# Patient Record
Sex: Female | Born: 1935 | Race: White | Hispanic: No | Smoking: Never smoker
Health system: Southern US, Community
[De-identification: ages and names within clinical notes are randomized; demographics above are authoritative.]

## PROBLEM LIST (undated history)

## (undated) DIAGNOSIS — R32 Unspecified urinary incontinence: Secondary | ICD-10-CM

## (undated) DIAGNOSIS — I5189 Other ill-defined heart diseases: Secondary | ICD-10-CM

## (undated) DIAGNOSIS — E119 Type 2 diabetes mellitus without complications: Secondary | ICD-10-CM

## (undated) DIAGNOSIS — I639 Cerebral infarction, unspecified: Secondary | ICD-10-CM

## (undated) DIAGNOSIS — N139 Obstructive and reflux uropathy, unspecified: Secondary | ICD-10-CM

## (undated) DIAGNOSIS — I2510 Atherosclerotic heart disease of native coronary artery without angina pectoris: Secondary | ICD-10-CM

## (undated) DIAGNOSIS — M1990 Unspecified osteoarthritis, unspecified site: Secondary | ICD-10-CM

## (undated) DIAGNOSIS — I10 Essential (primary) hypertension: Secondary | ICD-10-CM

## (undated) DIAGNOSIS — F32A Depression, unspecified: Secondary | ICD-10-CM

## (undated) DIAGNOSIS — E785 Hyperlipidemia, unspecified: Secondary | ICD-10-CM

## (undated) DIAGNOSIS — R011 Cardiac murmur, unspecified: Secondary | ICD-10-CM

## (undated) DIAGNOSIS — E1142 Type 2 diabetes mellitus with diabetic polyneuropathy: Secondary | ICD-10-CM

## (undated) DIAGNOSIS — Z7901 Long term (current) use of anticoagulants: Secondary | ICD-10-CM

## (undated) DIAGNOSIS — N189 Chronic kidney disease, unspecified: Secondary | ICD-10-CM

## (undated) DIAGNOSIS — D151 Benign neoplasm of heart: Secondary | ICD-10-CM

## (undated) DIAGNOSIS — F329 Major depressive disorder, single episode, unspecified: Secondary | ICD-10-CM

## (undated) DIAGNOSIS — Z794 Long term (current) use of insulin: Secondary | ICD-10-CM

## (undated) DIAGNOSIS — N390 Urinary tract infection, site not specified: Secondary | ICD-10-CM

## (undated) DIAGNOSIS — N289 Disorder of kidney and ureter, unspecified: Secondary | ICD-10-CM

## (undated) DIAGNOSIS — R262 Difficulty in walking, not elsewhere classified: Secondary | ICD-10-CM

## (undated) HISTORY — DX: Chronic kidney disease, unspecified: N18.9

## (undated) HISTORY — DX: Obstructive and reflux uropathy, unspecified: N13.9

## (undated) HISTORY — DX: Other ill-defined heart diseases: I51.89

## (undated) HISTORY — DX: Cerebral infarction, unspecified: I63.9

## (undated) HISTORY — DX: Major depressive disorder, single episode, unspecified: F32.9

## (undated) HISTORY — DX: Unspecified urinary incontinence: R32

## (undated) HISTORY — DX: Hyperlipidemia, unspecified: E78.5

## (undated) HISTORY — PX: REPLACEMENT TOTAL KNEE: SUR1224

## (undated) HISTORY — DX: Type 2 diabetes mellitus with diabetic polyneuropathy: E11.42

## (undated) HISTORY — DX: Type 2 diabetes mellitus without complications: E11.9

## (undated) HISTORY — DX: Long term (current) use of insulin: Z79.4

## (undated) HISTORY — DX: Essential (primary) hypertension: I10

## (undated) HISTORY — DX: Benign neoplasm of heart: D15.1

## (undated) HISTORY — DX: Cardiac murmur, unspecified: R01.1

## (undated) HISTORY — DX: Depression, unspecified: F32.A

## (undated) HISTORY — DX: Atherosclerotic heart disease of native coronary artery without angina pectoris: I25.10

## (undated) HISTORY — DX: Unspecified osteoarthritis, unspecified site: M19.90

## (undated) HISTORY — DX: Long term (current) use of anticoagulants: Z79.01

---

## 2008-11-23 HISTORY — PX: CORONARY ARTERY BYPASS GRAFT: SHX141

## 2013-11-23 HISTORY — PX: AMPUTATION TOE: SHX6595

## 2015-11-24 DIAGNOSIS — D151 Benign neoplasm of heart: Secondary | ICD-10-CM

## 2015-11-24 HISTORY — PX: OTHER SURGICAL HISTORY: SHX169

## 2015-11-24 HISTORY — DX: Benign neoplasm of heart: D15.1

## 2016-05-13 ENCOUNTER — Other Ambulatory Visit: Payer: Self-pay | Admitting: Urology

## 2016-06-16 ENCOUNTER — Other Ambulatory Visit: Payer: Self-pay | Admitting: Urology

## 2016-07-02 ENCOUNTER — Other Ambulatory Visit: Payer: Self-pay | Admitting: Urology

## 2016-07-24 HISTORY — PX: EXCISION MYXOMA: SHX5060

## 2016-07-25 ENCOUNTER — Ambulatory Visit: Payer: Medicare Other

## 2016-07-25 DIAGNOSIS — Z01818 Encounter for other preprocedural examination: Secondary | ICD-10-CM

## 2016-07-25 NOTE — Pre-Procedure Instructions (Signed)
   Telephone conference with pt/daughter/daughter in law, pt lives in [place] w/daughter   Coming to stay w/daughter in law during surgery and recovery   Pt has apt w/surgeon 9/12 for pre-op   PCP's LOV April 2017 per daughter lab including A1C done/ need to request/ Dr in [place]   No  cardio, no recent ekg/pt had coronary by pass 2012/DM type 2/need ekg, order in epic   Need plan for Lantus/victoza, daughter will call PCP who manages   E mail map to [email or web address]

## 2016-07-28 ENCOUNTER — Other Ambulatory Visit (HOSPITAL_COMMUNITY): Payer: Self-pay

## 2016-07-28 DIAGNOSIS — Z01810 Encounter for preprocedural cardiovascular examination: Secondary | ICD-10-CM

## 2016-07-30 ENCOUNTER — Other Ambulatory Visit: Payer: Self-pay

## 2016-07-30 DIAGNOSIS — Z01818 Encounter for other preprocedural examination: Secondary | ICD-10-CM

## 2016-07-30 NOTE — Pre-Procedure Instructions (Signed)
   Call to PCP. No labs within prior 6 months.     Order for fasting blood sugar & A1C placed in EPIC.    Left message for patient to confirm insulin instructions and plan for EKG at [facility].  Asked patient to call [facility].  Message included that labs would be drawn at time of EKG visit.

## 2016-07-30 NOTE — Pre-Procedure Instructions (Addendum)
Received call from patient's daughter in law:  Pt will have EKG done in [place] where she resides, on Frdi[place] 07/31/16.  Copy will be faxed to [facility] as well as hand carried.  Pt will come to [facility] on 08/03/16 for fbs and HgbA1c.       Addendum:  Spoke with sister-in-law Dorene Grebe (from [place]., [phone])).  Patient had labs at [facility] on 07/30/16 @445pm .  Will request.     Patient is going today at 3pm for EKG.  Will hand carry as well as have copy faxed to [facility].    Natalie to call PCP to obtain insulin instructions.     Patients fasting blood sugar ranges between 120-140

## 2016-07-31 ENCOUNTER — Ambulatory Visit (HOSPITAL_COMMUNITY)
Admission: RE | Admit: 2016-07-31 | Discharge: 2016-07-31 | Disposition: A | Discharge status: Home / Self Care | Payer: Medicare Other | Source: Ambulatory Visit | Attending: Cardiovascular Disease | Admitting: Cardiovascular Disease

## 2016-07-31 DIAGNOSIS — Z01810 Encounter for preprocedural cardiovascular examination: Secondary | ICD-10-CM | POA: Insufficient documentation

## 2016-07-31 NOTE — Pre-Procedure Instructions (Signed)
Orders for A1C and glucose discontinued.  Not indicated for Cysto.
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
## 2016-08-04 NOTE — Pre-Procedure Instructions (Signed)
   Spoke w/ [facility] re: EKG - she requests I refax request to [phone] Attn: Isabelle Course - will fax to [facility]

## 2016-08-05 ENCOUNTER — Ambulatory Visit
Admission: RE | Admit: 2016-08-05 | Discharge: 2016-08-05 | Disposition: A | Discharge status: Home / Self Care | Payer: Medicare Other | Source: Ambulatory Visit | Attending: Urology | Admitting: Urology

## 2016-08-05 ENCOUNTER — Ambulatory Visit: Payer: Medicare Other | Admitting: Anesthesiology

## 2016-08-05 ENCOUNTER — Ambulatory Visit: Payer: Medicare Other

## 2016-08-05 ENCOUNTER — Ambulatory Visit: Payer: Medicare Other | Admitting: Urology

## 2016-08-05 ENCOUNTER — Other Ambulatory Visit: Payer: Self-pay

## 2016-08-05 ENCOUNTER — Encounter
Admission: RE | Disposition: A | Discharge status: Home / Self Care | Payer: Self-pay | Source: Ambulatory Visit | Attending: Urology

## 2016-08-05 DIAGNOSIS — Z7982 Long term (current) use of aspirin: Secondary | ICD-10-CM | POA: Insufficient documentation

## 2016-08-05 DIAGNOSIS — E119 Type 2 diabetes mellitus without complications: Secondary | ICD-10-CM | POA: Insufficient documentation

## 2016-08-05 DIAGNOSIS — N1330 Unspecified hydronephrosis: Secondary | ICD-10-CM | POA: Insufficient documentation

## 2016-08-05 DIAGNOSIS — E785 Hyperlipidemia, unspecified: Secondary | ICD-10-CM | POA: Insufficient documentation

## 2016-08-05 DIAGNOSIS — Z951 Presence of aortocoronary bypass graft: Secondary | ICD-10-CM | POA: Insufficient documentation

## 2016-08-05 DIAGNOSIS — I10 Essential (primary) hypertension: Secondary | ICD-10-CM | POA: Insufficient documentation

## 2016-08-05 DIAGNOSIS — R32 Unspecified urinary incontinence: Secondary | ICD-10-CM | POA: Insufficient documentation

## 2016-08-05 DIAGNOSIS — Z87440 Personal history of urinary (tract) infections: Secondary | ICD-10-CM | POA: Insufficient documentation

## 2016-08-05 DIAGNOSIS — Z794 Long term (current) use of insulin: Secondary | ICD-10-CM | POA: Insufficient documentation

## 2016-08-05 HISTORY — DX: Essential (primary) hypertension: I10

## 2016-08-05 HISTORY — DX: Difficulty in walking, not elsewhere classified: R26.2

## 2016-08-05 HISTORY — DX: Type 2 diabetes mellitus without complications: E11.9

## 2016-08-05 HISTORY — DX: Depression, unspecified: F32.A

## 2016-08-05 HISTORY — DX: Unspecified osteoarthritis, unspecified site: M19.90

## 2016-08-05 HISTORY — DX: Hyperlipidemia, unspecified: E78.5

## 2016-08-05 HISTORY — DX: Urinary tract infection, site not specified: N39.0

## 2016-08-05 LAB — GLUCOSE WHOLE BLOOD - POCT
Whole Blood Glucose POCT: 156 mg/dL — ABNORMAL HIGH (ref 70–100)
Whole Blood Glucose POCT: 126 mg/dL — ABNORMAL HIGH (ref 70–100)

## 2016-08-05 SURGERY — CYSTOSCOPY, INSERTION INDWELLING URETERAL STENT
Anesthesia: Anesthesia General | Site: Pelvis | Laterality: Right | Wound class: Clean Contaminated

## 2016-08-05 MED ORDER — GLYCOPYRROLATE 0.2 MG/ML IJ SOLN
INTRAMUSCULAR | Status: AC
Start: 2016-08-05 — End: ?
  Filled 2016-08-05: qty 1

## 2016-08-05 MED ORDER — ONDANSETRON HCL 4 MG/2ML IJ SOLN
INTRAMUSCULAR | Status: AC
Start: 2016-08-05 — End: ?
  Filled 2016-08-05: qty 2

## 2016-08-05 MED ORDER — SODIUM CHLORIDE 0.9 % IV MBP
1000.0000 mg | INTRAVENOUS | Status: DC
Start: 2016-08-05 — End: 2016-08-05
  Administered 2016-08-05: 1 g via INTRAVENOUS

## 2016-08-05 MED ORDER — PHENYLEPHRINE 100 MCG/ML IN NACL 0.9% IV SOSY
PREFILLED_SYRINGE | INTRAVENOUS | Status: DC | PRN
Start: 2016-08-05 — End: 2016-08-05
  Administered 2016-08-05: 50 ug via INTRAVENOUS
  Administered 2016-08-05 (×2): 100 ug via INTRAVENOUS
  Administered 2016-08-05: 50 ug via INTRAVENOUS
  Administered 2016-08-05: 100 ug via INTRAVENOUS

## 2016-08-05 MED ORDER — PROPOFOL INFUSION 10 MG/ML
INTRAVENOUS | Status: DC | PRN
Start: 2016-08-05 — End: 2016-08-05
  Administered 2016-08-05 (×2): 30 mg via INTRAVENOUS
  Administered 2016-08-05: 100 mg via INTRAVENOUS

## 2016-08-05 MED ORDER — FAMOTIDINE 20 MG/2ML IV SOLN
INTRAVENOUS | Status: AC
Start: 2016-08-05 — End: 2016-08-05
  Administered 2016-08-05: 20 mg via INTRAVENOUS
  Filled 2016-08-05: qty 2

## 2016-08-05 MED ORDER — ONDANSETRON HCL 4 MG/2ML IJ SOLN
4.0000 mg | Freq: Once | INTRAMUSCULAR | Status: DC | PRN
Start: 2016-08-05 — End: 2016-08-05

## 2016-08-05 MED ORDER — ACETAMINOPHEN 500 MG PO TABS
1000.0000 mg | ORAL_TABLET | Freq: Once | ORAL | Status: AC
Start: 2016-08-05 — End: 2016-08-05

## 2016-08-05 MED ORDER — OXYCODONE-ACETAMINOPHEN 5-325 MG PO TABS
1.0000 | ORAL_TABLET | Freq: Once | ORAL | Status: DC | PRN
Start: 2016-08-05 — End: 2016-08-05

## 2016-08-05 MED ORDER — ACETAMINOPHEN 500 MG PO TABS
ORAL_TABLET | ORAL | Status: AC
Start: 2016-08-05 — End: 2016-08-05
  Administered 2016-08-05: 1000 mg via ORAL
  Filled 2016-08-05: qty 2

## 2016-08-05 MED ORDER — DEXAMETHASONE SODIUM PHOSPHATE 20 MG/5ML IJ SOLN
INTRAMUSCULAR | Status: AC
Start: 2016-08-05 — End: ?
  Filled 2016-08-05: qty 5

## 2016-08-05 MED ORDER — GLYCOPYRROLATE 0.2 MG/ML IJ SOLN
INTRAMUSCULAR | Status: DC | PRN
Start: 2016-08-05 — End: 2016-08-05
  Administered 2016-08-05: .05 mg via INTRAVENOUS

## 2016-08-05 MED ORDER — FAMOTIDINE 10 MG/ML IV SOLN (WRAP)
INTRAVENOUS | Status: DC | PRN
Start: 2016-08-05 — End: 2016-08-05
  Administered 2016-08-05: 20 mg via INTRAVENOUS

## 2016-08-05 MED ORDER — SODIUM CHLORIDE 0.9% BAG (IRRIGATION USE)
INTRAVENOUS | Status: DC | PRN
Start: 2016-08-05 — End: 2016-08-05
  Administered 2016-08-05: 3000 mL

## 2016-08-05 MED ORDER — ROCURONIUM BROMIDE 50 MG/5ML IV SOLN
INTRAVENOUS | Status: AC
Start: 2016-08-05 — End: ?
  Filled 2016-08-05: qty 5

## 2016-08-05 MED ORDER — NITROFURANTOIN MACROCRYSTAL 100 MG PO CAPS
100.0000 mg | ORAL_CAPSULE | Freq: Two times a day (BID) | ORAL | 0 refills | Status: DC
Start: 2016-08-05 — End: 2016-08-05

## 2016-08-05 MED ORDER — FENTANYL CITRATE (PF) 50 MCG/ML IJ SOLN (WRAP)
25.0000 ug | INTRAMUSCULAR | Status: DC | PRN
Start: 2016-08-05 — End: 2016-08-05

## 2016-08-05 MED ORDER — METOCLOPRAMIDE HCL 5 MG/ML IJ SOLN
10.0000 mg | Freq: Once | INTRAMUSCULAR | Status: DC | PRN
Start: 2016-08-05 — End: 2016-08-05

## 2016-08-05 MED ORDER — ONDANSETRON HCL 4 MG/2ML IJ SOLN
INTRAMUSCULAR | Status: DC | PRN
Start: 2016-08-05 — End: 2016-08-05
  Administered 2016-08-05: 4 mg via INTRAVENOUS

## 2016-08-05 MED ORDER — FAMOTIDINE 20 MG/2ML IV SOLN
INTRAVENOUS | Status: AC
Start: 2016-08-05 — End: ?
  Filled 2016-08-05: qty 2

## 2016-08-05 MED ORDER — FENTANYL CITRATE (PF) 50 MCG/ML IJ SOLN (WRAP)
INTRAMUSCULAR | Status: DC | PRN
Start: 2016-08-05 — End: 2016-08-05
  Administered 2016-08-05 (×4): 25 ug via INTRAVENOUS

## 2016-08-05 MED ORDER — LACTATED RINGERS IV SOLN
INTRAVENOUS | Status: DC
Start: 2016-08-05 — End: 2016-08-05
  Administered 2016-08-05: 1000 mL via INTRAVENOUS

## 2016-08-05 MED ORDER — LIDOCAINE HCL (PF) 2 % IJ SOLN
INTRAMUSCULAR | Status: AC
Start: 2016-08-05 — End: ?
  Filled 2016-08-05: qty 5

## 2016-08-05 MED ORDER — FENTANYL CITRATE (PF) 50 MCG/ML IJ SOLN (WRAP)
INTRAMUSCULAR | Status: AC
Start: 2016-08-05 — End: ?
  Filled 2016-08-05: qty 2

## 2016-08-05 MED ORDER — NITROFURANTOIN MACROCRYSTAL 50 MG PO CAPS
100.0000 mg | ORAL_CAPSULE | Freq: Two times a day (BID) | ORAL | 0 refills | Status: DC
Start: 2016-08-05 — End: 2016-08-25
  Filled 2016-08-05: qty 60, 15d supply, fill #0

## 2016-08-05 MED ORDER — IOVERSOL 68 % IJ SOLN
INTRAMUSCULAR | Status: DC | PRN
Start: 2016-08-05 — End: 2016-08-05
  Administered 2016-08-05: 50 mL

## 2016-08-05 MED ORDER — LACTATED RINGERS IV SOLN
75.0000 mL/h | INTRAVENOUS | Status: AC
Start: 2016-08-05 — End: 2016-08-05

## 2016-08-05 MED ORDER — FAMOTIDINE 10 MG/ML IV SOLN (WRAP)
20.0000 mg | Freq: Once | INTRAVENOUS | Status: AC
Start: 2016-08-05 — End: 2016-08-05

## 2016-08-05 MED ORDER — LIDOCAINE HCL 2 % IJ SOLN
INTRAMUSCULAR | Status: DC | PRN
Start: 2016-08-05 — End: 2016-08-05
  Administered 2016-08-05: 50 mg via INTRAVENOUS

## 2016-08-05 MED ORDER — PROPOFOL 10 MG/ML IV EMUL (WRAP)
INTRAVENOUS | Status: AC
Start: 2016-08-05 — End: ?
  Filled 2016-08-05: qty 20

## 2016-08-05 MED ORDER — LIDOCAINE(URO-JET) 2% JELLY (WRAP)
CUTANEOUS | Status: DC | PRN
Start: 2016-08-05 — End: 2016-08-05
  Administered 2016-08-05: 10 mL via TOPICAL

## 2016-08-05 MED ORDER — HYDROMORPHONE HCL 1 MG/ML IJ SOLN
0.5000 mg | INTRAMUSCULAR | Status: DC | PRN
Start: 2016-08-05 — End: 2016-08-05

## 2016-08-05 SURGICAL SUPPLY — 12 items
CATHETER URETERAL FLEXIMA OD5 FR L70 CM (Catheter Urine) ×2
CATHETER URETERAL FLEXIMA OD5 FR L70 CM 1 LUMEN INJECTION HUB (Catheter Urine) ×2 IMPLANT
CATHETER URETHOPEN END 5F (Catheter Urine) ×1
GLOVE SRGCL 7 GMMX CHEMOTHERAPY PWDR FREE ANTISLIP SMTH MICRO NPRN GRN (Glove) ×2 IMPLANT
GLOVE SURG DERMAPRENE NL SZ7 (Glove) ×1
GLOVE SURGICAL 7 GAMMEX CHEMOTHERAPY (Glove) ×2
SLEEVE CMPR MED KN LGTH KDL SCD 21- IN (Sleeve) ×2
SLEEVE COMPRESSION MEDIUM KNEE LENGTH KENDALL SEQUENTIAL OD21- IN (Sleeve) ×2 IMPLANT
SLEEVE SEQ COMP KNEE REGULAR (Sleeve) ×1
STENT PERCUFLEX PLUS HYDROPLUS OD6 FR (Stent) ×2 IMPLANT
STENT PERCUFLEX PLUS HYDROPLUS OD6 FR L26 CM PERCUFLEXâ„¢ TAPER TIP (Stent) ×2 IMPLANT
TRAY CYSTOSCOPY PACK (Pack) ×3 IMPLANT

## 2016-08-05 NOTE — Transfer of Care (Signed)
Anesthesia Transfer of Care Note    Patient: Glenda Green    Procedures performed: Procedure(s) with comments:  CYSTOSCOPY,RIGHT/RETROGRADE, RIGHT/ URETEROSCOPY, STENT PLACEMENT. - CYSTOSCOPY,RIGHT/RETROGRADE, RIGHT/ URETEROSCOPY, STENT PLACEMENT.    Anesthesia type: General LMA    Patient location:Phase I PACU    Last vitals:   Vitals:    08/05/16 0936   BP: (!) 177/97   Pulse: 77   Resp: 18   Temp:    SpO2: 100%       Post pain: Patient not complaining of pain, continue current therapy      Mental Status:awake and alert     Respiratory Function: tolerating face mask    Cardiovascular: stable    Nausea/Vomiting: patient not complaining of nausea or vomiting    Hydration Status: adequate    Post assessment: no apparent anesthetic complications and no reportable events    Signed by: Hoover Browns  08/05/16 9:37 AM

## 2016-08-05 NOTE — Discharge Instr - AVS First Page (Addendum)
Cystoscopy Discharge Instructions    GENERAL INFORMATION:   . Do not drive a car or operate machinery for 24 hours.  . Do not consume alcohol, tranquilizers, sleeping medications, or any non-prescribed medications for 24 hours unless approved by your physician.    . Do not make important decisions or sign any important papers in the next 24 hours.   . Please have a responsible person with you tonight.     ACTIVITY:   . Rest for 24 hours.    TREATMENT:  . You may have burning with urination. This is normal. To alleviate discomfort, drink plenty of fluids today and tomorrow.   . If it is difficult to start urinating, sit in a tub of warm water to urinate.   . The doctor has dilated your urethra. You will have a small amount of bleeding. It should stop within 24-28 hours.     MEDICATIONS:  . No aspirin or aspirin-containing products until advised by your physician.  . Use pain medication as directed by your physician.     DIET:   . Begin with clear liquids, progress to normal diet.      NOTIFY PHYSICIAN IF:  . Persistent nausea or vomiting.  . Chills, fever (above 101 degrees F).   . Increased bleeding.  . Unable to urinate in 6-8 hours and abdomen is distended.                                   Ureteral Stent    A ureteral stent is a soft plastic tube with holes in it. It's temporarily inserted into a ureter to help drain urine into the bladder. One end goes in the kidney. The other end goes in the bladder. A coil on each end holds the stent in place. The stent can't be seen from outside the body. It shouldn't interfere with your normal routine. Your stent will be put in by a urologist (doctor trained in treating the urinary tract) or another specialist. The procedure is done in a hospital or surgery center. You'll likely go home the same day.  When Is a Ureteral Stent Used?  A ureteral stent may be used:   To bypass a blockage in a kidney or ureter.   During kidney stone removal.   To let a ureter heal after  surgery.    Before the Procedure  Your doctor will give you instructions toprepare for the procedure. X-rays or other imaging tests of your kidneys and ureters may be done beforehand.  During the Procedure   You receive medication to prevent pain and help you relax or sleep during the procedure. Once this takes effect, the procedure starts.   The doctor inserts a cystoscope(lighted instrument) through the urethra and into the bladder. This shows the opening to the ureter.   A thin wire is carefully threaded through the cystoscope, up the ureter, and into the kidney. The stent is inserted over the wire.   A fluoroscope (special x-ray machine) is used to help position the stent. When the stent is in place, the wire and cystoscope are removed.  While You Have a Stent   Some discomfort is normal. Certain movements may trigger pain or a feelingthat you need to urinate. You may also feel mild soreness or pressure before or during urination. These symptoms will go away a few days after the stent is removed.   Medication to control  pain or bladder spasms or to prevent infection may be prescribed. Take this as directed.   Drink plenty of fluids to help flush out your urinary tract.   Your urine may be slightly pink or red. This is due to bleeding caused by minor irritation from the stent. This may happenon and off while you have the stent.  How Long Will You Need a Stent?  The stent is often taken out after the blockagein the ureter is treated or the ureter has healed. This may take1-2 weeks, or longer. If a stent is needed for a long time, it may need to be changed every few months.  Call Your Doctor If:   Your urine contains blood clots.   You constantly leak urine.   You have a fever over 100.52F, chills, nausea, or vomiting.   Your pain is not relieved with medication.   The end of the stent comes out of the urethra.      [address], [address], [place], [place] [address]. All  rights reserved. This information is not intended as a substitute for professional medical care. Always follow your healthcare professional's instructions.    Otelia Santee, MD 08/05/2016     Anesthesia Discharge Instructions: After Your Surgery  You've just had surgery. During surgery you were given medicine called anesthesia to keep you relaxed and comfortable. After surgery you may have some pain or nausea. This is common. Your doctor or nurse will show you how to take care of yourself when you go home. He or she will also answer your questions. Here are some tips for feeling better and getting well after surgery.        For the first 24 hours after your surgery:   Do not drive or use heavy equipment.    Do not make important decisions or sign legal papers.   Do not drink alcohol.   Have someone stay with you, if needed. He or she can watch for problems and help keep you safe.   Have an adult family member or friend drive you home.    Managing pain  If you have pain after surgery, pain medicine will help you feel better. Take it as ordered by your surgeon, before pain becomes severe. Also, ask your doctor about other ways to control pain. This might be with rest, ice, repositioning, and elevation. Follow any other instructions your surgeon or nurse gives you.    Tips for taking pain medicine:    Stay on schedule with your medication   Most pain relievers taken by mouth need at least 20 to 30 minutes to start to work.   Taking medicine on a schedule can help you remember to take it. Try to time your medicine so that you can take it before starting an activity. This might be before you get dressed, go for a walk, or sit down for dinner.    Common side effects of prescription pain medications   Pain medicines can cause nausea. Eat a little food before taking pain medicine to avoid this.   Constipation is a common side effect of pain medicines. Drinking lots of fluids and eating foods, such as fruits and  vegetables, that are high in fiber can also help.    Call your doctor before taking any medicines such as laxatives or stool softeners to help ease constipation. Also, ask if you should avoid any foods. Remember, do not take laxatives unless your surgeon has prescribed them  Drinking alcohol and taking pain medicines can cause dizziness and slow your breathing. It can even be deadly. Do not drink alcohol while taking pain medicines.   Pain medicines can make you react more slowly to things. Do not drive or run machinery while taking pain medicines.    Important facts about Acetaminophen (Tylenol)   Acetaminophen or Tylenol is a common pain reliever.   Check your prescription pain medication labels to see if it contains acetaminophen.   Your health care provider may tell you to take acetaminophen to help ease your pain. Ask him or her how much you should to take each day.    Some prescription pain medicines have acetaminophen and other ingredients. Using both prescription pain medicines and over the counter (OTC) acetaminophen for pain can cause you to overdose.   Max dose of acetaminophen is 4000mg /24 hours for most people. Overdosing on acetaminophen may lead to liver failure.   Read the labels on your (over the counter) medicines with care. This will help you to clearly know the list of ingredients, how much to take, and any warnings. This will prevent you from taking too much acetaminophen.   If you have questions or do not understand the information, ask your pharmacist or health care provider to explain it to you before you take the OTC medicine.    Managing nausea  Some people have an upset stomach after surgery. This is often because of anesthesia, pain, pain medicines, or the stress of surgery. If you were on a special food plan before surgery, ask your doctor if you should follow it while you get better. The following are tips to help you manage nausea after anesthesia:   Do not push yourself to  eat. Your body will tell you when to eat and how much.   Start off with clear liquids and soup. They are easier to digest.   Next try semi-solid foods such as mashed potatoes, applesauce, and gelatin, as you feel ready.   Slowly move to solid foods. Don't eat fatty, rich, or spicy foods at first.   Do not force yourself to have 3 large meals a day. Instead eat smaller amounts more often.   Take pain medicines with a small amount of food, such as crackers or toast, to avoid nausea.     Call your surgeon if:   You have worsening pain an hour after taking pain medicine. The pain medicine may not be strong enough.   You feel too sleepy, dizzy, or groggy. The pain medicine may be too strong.   You have side effects like persistent nausea, vomiting, or skin changes such as rash, itching, or hives.    You have bleeding through your dressing or your dressing becomes saturated.   You have signs of infection such as redness, fever, or increased/foul smelling drainage.      If you have obstructive sleep apnea   You were given anesthesia medicine during surgery to keep you comfortable and free of pain. After surgery, you may have more apnea spells because of this medicine and other medicines you were given. The spells may last longer than usual.   At home:   Keep using the continuous positive airway pressure (CPAP) device when you sleep. Unless your health care provider tells you not to, use it when you sleep, day or night. CPAP is a common device used to treat obstructive sleep apnea.   Talk with your provider before taking any pain medicine, muscle relaxants, or  sedatives. Your provider will tell you about the possible dangers of taking these medicines.

## 2016-08-05 NOTE — PACU (Signed)
Called anesthesia for post op note. Unable to visit patient prior to discharge. Ok for patient to go home.

## 2016-08-05 NOTE — Anesthesia Postprocedure Evaluation (Signed)
Anesthesia Post Evaluation    Patient: Glenda Green    Procedure(s) with comments:  CYSTOSCOPY,RIGHT/RETROGRADE, RIGHT/ URETEROSCOPY, STENT PLACEMENT. - CYSTOSCOPY,RIGHT/RETROGRADE, RIGHT/ URETEROSCOPY, STENT PLACEMENT.    Anesthesia type: general    Last Vitals:   Vitals:    08/05/16 1200   BP:    Pulse: 76   Resp:    Temp:    SpO2: 100%                         Post Assessment: no apparent anesthetic complications and no reportable events          Anesthesia Qualified Clinical Data Registry    Central Line      CVC insertion : NO                                               Perioperative temperature management      General/neuraxial anesthesia > or = 60 minutes (excluding CABG) : YES              > Use of intraoperative active warming : YES              > Temperature > or = 36 degrees Centigrade (96.8 degrees Farenheit) during time span from 30 minutes before up to 15 minutes after anesthesia end time : YES      Administration of antibiotic prophylaxis      Age > or = 18, with IV access, with surgical procedure for which antibiotic prophylaxis indicated, and not on chronic antibiotics : NO                  Medication Administration      Ordering or administration of drug inconsistent with intended drug, dose, delivery or timing : NO      Dental loss/damage      Dental injury with administration of anesthesia : NO      Difficult intubation due to unrecognized difficult airway        Elective airway procedure including but not limited to: tracheostomy, fiberoptic bronchoscopy, rigid bronchoscopy; jet ventilation; or elective use of a device to facilitate airway management such as a Glidescope : NO                > Unanticipated difficult intubation post pre-evaluation : NO      Aspiration of gastric contents        Aspiration of gastric contents : NO                    Surgical fire        Procedure requiring electrocautery/laser : YES                > Ignition/burning in invasive procedure location : NO       Immediate perioperative cardiac arrest        Cardiac arrest in OR or PACU : NO                    Unplanned hospital admission        Unplanned hospital admission for initially intended outpatient anesthesia service : NO      Unplanned ICU admission        Unplanned ICU admission related to anesthesia occurring within 24 hours of induction or start of MAC :  NO      Surgical case cancellation        Cancellation of procedure after care already started by anesthesia care team : NO      Post-anesthesia transfer of care checklist/protocol to PACU        Transfer from OR to PACU upon case conclusion : YES              > Use of PACU transfer checklist/protocol : YES     (Includes the key elements of: patient identification, responsible practitioner identification (PACU nurse or advanced practitioner), discussion of pertinent history and procedure course, intraoperative anesthetic management, post-procedure plans, acknowledgement/questions)    Post-anesthesia transfer of care checklist/protocol to ICU        Transfer from OR to ICU upon case conclusion : NO                    Post-operative nausea/vomiting risk protocol        Post-operative nausea/vomiting risk protocol : YES  Patient > or = 18 with care initiated by anesthesia team that has a risk factor screen for post-op nausea/vomiting (Includes female, hx PONV, or motion sickness, non-smoker, intended opioid administration for post-op analgesia.)    Anaphylaxis        Anaphylaxis during anesthesia services : NO    (Inclusive of any suspected transfusion reaction in association with blood-bank confirmed blood product incompatibility)            NOTE: Patient discharged after meeting criteria. No CV, RESP, Neuro, Hydration, Temp, PONV or Pain problems related to anesthesia care.   Glenda Green, 08/05/2016 3:26 PM

## 2016-08-05 NOTE — Anesthesia Preprocedure Evaluation (Signed)
Anesthesia Evaluation    AIRWAY    Mallampati: I    TM distance: >3 FB  Neck ROM: full  Mouth Opening:full   CARDIOVASCULAR    cardiovascular exam normal       DENTAL    no notable dental hx     PULMONARY    pulmonary exam normal     OTHER FINDINGS    Glenda Green, Glenda Green ([id number]) - 80 y.o. Female     Anesthesia History       Hypertension   Hyperlipidemia  Type 2 diabetes mellitus, controlled   Arthritis  Depression   Urinary tract infection  Difficulty walking   Surgical History       CORONARY ARTERY BYPASS   FOOT AMPUTATION  JOINT REPLACEMENT   Substance History       Smoking Status: Never Smoker  Smokeless Tobacco Status: Unknown  Alcohol use: No  Drug use: Not Asked  Problem List       Hydronephrosis of right kidney      NOTE:    1. HTN controlled with Toprol    2. DM type 2. Takes Lantus 10 units q night. Only took 5 units last night. Accu check = 150    3. HLD    4. CAD. S/p CABG. Not currently symptomatic    5. Uses a wheelchair due to instability/ loss of balance secondary to tor amputation.           Relevant Problems   No active problems are marked relevant to this note.               Anesthesia Plan    ASA 3     general                     intravenous induction   Detailed anesthesia plan: general LMA        Post op pain management: per surgeon    informed consent obtained    Plan discussed with CRNA.    ECG reviewed  pertinent labs reviewed             Signed by: Francis Gaines Dejanee Thibeaux 08/05/16 9:04 AM

## 2016-08-05 NOTE — H&P (Signed)
[facility]   HISTORY AND PHYSICAL EXAM      Date Time: 08/05/16 8:18 AM  Patient Name: Glenda Green,Glenda Green      History of Presenting Illness:   80 year-old female with h/o recurrent UTI, incontinence and right hydroureteronephrosis/obstruction for cystoscopy, right retrograde, right ureteroscopy with possible dilation and/or biopsy and ureteral stent placement.    Past Medical History:     Past Medical History:   Diagnosis Date   . Arthritis    . Depression    . Difficulty walking     unsteady gait uses walker/cane/wheelchair   . Hyperlipidemia    . Hypertension    . Type 2 diabetes mellitus, controlled    . Urinary tract infection     multiple time on Nitrofurantion maintaining dose       Past Surgical History:     Past Surgical History:   Procedure Laterality Date   . CORONARY ARTERY BYPASS  2010   . FOOT AMPUTATION  2015    x2 three toes   . JOINT REPLACEMENT  2010    left knee       Family History:   History reviewed. No pertinent family history.    Social History:     Social History     Social History   . Marital status: Widowed     Spouse name: N/Green   . Number of children: N/Green   . Years of education: N/Green     Social History Main Topics   . Smoking status: Never Smoker   . Smokeless tobacco: Not on file   . Alcohol use No   . Drug use: Unknown   . Sexual activity: Not on file     Other Topics Concern   . Not on file     Social History Narrative   . No narrative on file       Allergies:     Allergies   Allergen Reactions   . Tape Itching       Medications:     Prior to Admission medications    Medication Sig Start Date End Date Taking? Authorizing Provider   aspirin EC 81 MG EC tablet Take 81 mg by mouth daily.   Yes [provider]   atorvastatin (LIPITOR) 40 MG tablet Take 40 mg by mouth daily.   Yes [provider]   DULoxetine (CYMBALTA) 60 MG capsule Take 60 mg by mouth daily.   Yes [provider]   ferrous sulfate 325 (65 FE) MG tablet Take 325 mg by mouth every morning with  breakfast.   Yes [provider]   Insulin Glargine (LANTUS SC) Inject 10 Unit into the skin nightly.Took 5 units 08/04/16       Yes [provider]   liraglutide (VICTOZA) 18 MG/3ML Solution Pen-injector Inject 1.8 each into the skin daily.       Yes [provider]   metoprolol XL (TOPROL-XL) 25 MG 24 hr tablet Take 25 mg by mouth daily.   Yes [provider]   nitrofurantoin (MACRODANTIN) 100 MG capsule Take 100 mg by mouth daily.   Yes [provider]   Probiotic Product (PROBIOTIC ADVANCED PO) Take by mouth.   Yes [provider]       Physical Exam:   Constitutional: Well-appearing  Chest: Normal chest rise  Cardiovascular: Regular rate  Abdomen: Soft, non-tender  Neuro/Pysch: Alert, oriented and grossly non-focal    Assessment & Plan:   Patient has  been evaluated and is deemed safe to proceed forward with the procedure discussed.   They have been given ample opportunity to ask appropriate questions and those questions have been answered to their satisfaction.   The consent form has been signed, witnessed and placed into the chart.        Signed by: Otelia Santee, MD

## 2016-08-05 NOTE — Brief Op Note (Signed)
BRIEF OP NOTE    Date Time: 08/05/16 9:29 AM    Patient Name:   Glenda Green    Date of Operation:   08/05/2016    Providers Performing:   Surgeon(s):  Darcia Lampi, Hoyle Sauer, MD    Assistant (s): None    Operative Procedure:   Procedure(s):  CYSTOSCOPY,RIGHT/RETROGRADE, RIGHT/ URETEROSCOPY, STENT PLACEMENT.    Preoperative Diagnosis:   Pre-Op Diagnosis Codes:     * Hydronephrosis, unspecified hydronephrosis type [N13.30]    Postoperative Diagnosis:   * No post-op diagnosis entered *    Anesthesia:   * No anesthesia type entered *    Estimated Blood Loss:        Implants:     Implant Name Type Inv. Item Serial No. Manufacturer Lot No. LRB No. Used Action   STENT PERCFLX 30F [id number] - [id number] Stent STENT PERCFLX 30F [id number]   [employer] [id number] Right 1 Implanted       Drains:   Drain #1: None    Specimens:        SPECIMENS (last 24 hours)      Pathology Specimens     Row Name 08/05/16 0900                Specimen Information    Specimen Testing Required None per surgeon           Findings:   Debris in bladder (culture sent); bullous edema of bladder wall likely c/w cystitis; punctate right UO papillary lesion vs. inflammatory changes at left UO; bladder trabeculation with cellule and saccule formation; 1.5-2cm narrowing of right distal ureter and severe hydroureteronephrosis proximally on RTG; debris in right kidney w/o any obvious stones or filling defects in kidney or ureter, as well as narrowing of distal ureter (no lesion) on ureteroscopy.    Complications:   None      Signed by: Otelia Santee, MD                                                                              [facility] OR

## 2016-08-06 ENCOUNTER — Encounter: Payer: Self-pay | Admitting: Urology

## 2016-08-08 ENCOUNTER — Emergency Department: Payer: Medicare Other

## 2016-08-08 ENCOUNTER — Inpatient Hospital Stay (HOSPITAL_COMMUNITY): Payer: Medicare Other | Admitting: Internal Medicine

## 2016-08-08 ENCOUNTER — Inpatient Hospital Stay
Admission: EM | Admit: 2016-08-08 | Discharge: 2016-08-25 | DRG: 982 | Disposition: A | Discharge status: Rehabilitation Facility | Payer: Medicare Other | Attending: Thoracic Surgery (Cardiothoracic Vascular Surgery) | Admitting: Thoracic Surgery (Cardiothoracic Vascular Surgery)

## 2016-08-08 DIAGNOSIS — E1165 Type 2 diabetes mellitus with hyperglycemia: Secondary | ICD-10-CM | POA: Diagnosis present

## 2016-08-08 DIAGNOSIS — I25810 Atherosclerosis of coronary artery bypass graft(s) without angina pectoris: Secondary | ICD-10-CM | POA: Diagnosis present

## 2016-08-08 DIAGNOSIS — Z23 Encounter for immunization: Secondary | ICD-10-CM

## 2016-08-08 DIAGNOSIS — E1121 Type 2 diabetes mellitus with diabetic nephropathy: Secondary | ICD-10-CM | POA: Diagnosis present

## 2016-08-08 DIAGNOSIS — E119 Type 2 diabetes mellitus without complications: Secondary | ICD-10-CM

## 2016-08-08 DIAGNOSIS — I639 Cerebral infarction, unspecified: Secondary | ICD-10-CM

## 2016-08-08 DIAGNOSIS — Z794 Long term (current) use of insulin: Secondary | ICD-10-CM

## 2016-08-08 DIAGNOSIS — Z01818 Encounter for other preprocedural examination: Secondary | ICD-10-CM

## 2016-08-08 DIAGNOSIS — R4701 Aphasia: Secondary | ICD-10-CM | POA: Diagnosis present

## 2016-08-08 DIAGNOSIS — N183 Chronic kidney disease, stage 3 (moderate): Secondary | ICD-10-CM | POA: Diagnosis present

## 2016-08-08 DIAGNOSIS — Q211 Atrial septal defect: Secondary | ICD-10-CM

## 2016-08-08 DIAGNOSIS — I10 Essential (primary) hypertension: Secondary | ICD-10-CM

## 2016-08-08 DIAGNOSIS — E7800 Pure hypercholesterolemia, unspecified: Secondary | ICD-10-CM | POA: Diagnosis present

## 2016-08-08 DIAGNOSIS — I4891 Unspecified atrial fibrillation: Secondary | ICD-10-CM | POA: Diagnosis not present

## 2016-08-08 DIAGNOSIS — E1122 Type 2 diabetes mellitus with diabetic chronic kidney disease: Secondary | ICD-10-CM | POA: Diagnosis present

## 2016-08-08 DIAGNOSIS — Q2112 Patent foramen ovale: Secondary | ICD-10-CM

## 2016-08-08 DIAGNOSIS — Z87440 Personal history of urinary (tract) infections: Secondary | ICD-10-CM

## 2016-08-08 DIAGNOSIS — E785 Hyperlipidemia, unspecified: Secondary | ICD-10-CM | POA: Diagnosis present

## 2016-08-08 DIAGNOSIS — N390 Urinary tract infection, site not specified: Secondary | ICD-10-CM

## 2016-08-08 DIAGNOSIS — R531 Weakness: Secondary | ICD-10-CM

## 2016-08-08 DIAGNOSIS — N3000 Acute cystitis without hematuria: Secondary | ICD-10-CM | POA: Diagnosis present

## 2016-08-08 DIAGNOSIS — I2510 Atherosclerotic heart disease of native coronary artery without angina pectoris: Secondary | ICD-10-CM | POA: Diagnosis present

## 2016-08-08 DIAGNOSIS — R471 Dysarthria and anarthria: Secondary | ICD-10-CM

## 2016-08-08 DIAGNOSIS — I63412 Cerebral infarction due to embolism of left middle cerebral artery: Principal | ICD-10-CM | POA: Diagnosis present

## 2016-08-08 DIAGNOSIS — N179 Acute kidney failure, unspecified: Secondary | ICD-10-CM | POA: Diagnosis present

## 2016-08-08 DIAGNOSIS — N1330 Unspecified hydronephrosis: Secondary | ICD-10-CM

## 2016-08-08 DIAGNOSIS — I701 Atherosclerosis of renal artery: Secondary | ICD-10-CM | POA: Diagnosis present

## 2016-08-08 DIAGNOSIS — R29710 NIHSS score 10: Secondary | ICD-10-CM | POA: Diagnosis present

## 2016-08-08 DIAGNOSIS — E875 Hyperkalemia: Secondary | ICD-10-CM | POA: Diagnosis not present

## 2016-08-08 DIAGNOSIS — D151 Benign neoplasm of heart: Secondary | ICD-10-CM

## 2016-08-08 DIAGNOSIS — I129 Hypertensive chronic kidney disease with stage 1 through stage 4 chronic kidney disease, or unspecified chronic kidney disease: Secondary | ICD-10-CM | POA: Diagnosis present

## 2016-08-08 DIAGNOSIS — D174 Benign lipomatous neoplasm of intrathoracic organs: Secondary | ICD-10-CM | POA: Diagnosis present

## 2016-08-08 HISTORY — DX: Disorder of kidney and ureter, unspecified: N28.9

## 2016-08-08 HISTORY — DX: Atherosclerotic heart disease of native coronary artery without angina pectoris: I25.10

## 2016-08-08 LAB — COMPREHENSIVE METABOLIC PANEL
ALT: 13 U/L (ref 0–55)
AST (SGOT): 13 U/L (ref 5–34)
Albumin/Globulin Ratio: 1.1 (ref 0.9–2.2)
Albumin: 3.3 g/dL — ABNORMAL LOW (ref 3.5–5.0)
Alkaline Phosphatase: 104 U/L (ref 37–106)
BUN: 28 mg/dL — ABNORMAL HIGH (ref 7.0–19.0)
Bilirubin, Total: 0.6 mg/dL (ref 0.2–1.2)
CO2: 22 mEq/L (ref 22–29)
Calcium: 8.7 mg/dL (ref 7.9–10.2)
Chloride: 99 mEq/L — ABNORMAL LOW (ref 100–111)
Creatinine: 1.5 mg/dL — ABNORMAL HIGH (ref 0.6–1.0)
Globulin: 3.1 g/dL (ref 2.0–3.6)
Glucose: 136 mg/dL — ABNORMAL HIGH (ref 70–100)
Potassium: 4.8 mEq/L (ref 3.5–5.1)
Protein, Total: 6.4 g/dL (ref 6.0–8.3)
Sodium: 130 mEq/L — ABNORMAL LOW (ref 136–145)

## 2016-08-08 LAB — CBC AND DIFFERENTIAL
Absolute NRBC: 0 10*3/uL
Basophils Absolute Automated: 0.08 10*3/uL (ref 0.00–0.20)
Basophils Automated: 0.7 %
Eosinophils Absolute Automated: 0.07 10*3/uL (ref 0.00–0.70)
Eosinophils Automated: 0.6 %
Hematocrit: 35.5 % — ABNORMAL LOW (ref 37.0–47.0)
Hgb: 11.3 g/dL — ABNORMAL LOW (ref 12.0–16.0)
Immature Granulocytes Absolute: 0.06 10*3/uL — ABNORMAL HIGH
Immature Granulocytes: 0.5 %
Lymphocytes Absolute Automated: 1.62 10*3/uL (ref 0.50–4.40)
Lymphocytes Automated: 13.7 %
MCH: 27.4 pg — ABNORMAL LOW (ref 28.0–32.0)
MCHC: 31.8 g/dL — ABNORMAL LOW (ref 32.0–36.0)
MCV: 86.2 fL (ref 80.0–100.0)
MPV: 9.6 fL (ref 9.4–12.3)
Monocytes Absolute Automated: 0.75 10*3/uL (ref 0.00–1.20)
Monocytes: 6.3 %
Neutrophils Absolute: 9.28 10*3/uL — ABNORMAL HIGH (ref 1.80–8.10)
Neutrophils: 78.2 %
Nucleated RBC: 0 /100 WBC (ref 0.0–1.0)
Platelets: 315 10*3/uL (ref 140–400)
RBC: 4.12 10*6/uL — ABNORMAL LOW (ref 4.20–5.40)
RDW: 14 % (ref 12–15)
WBC: 11.86 10*3/uL — ABNORMAL HIGH (ref 3.50–10.80)

## 2016-08-08 LAB — URINALYSIS, REFLEX TO MICROSCOPIC EXAM IF INDICATED
Bilirubin, UA: NEGATIVE
Glucose, UA: 50 — AB
Ketones UA: NEGATIVE
Nitrite, UA: NEGATIVE
Protein, UR: 100 — AB
Specific Gravity UA: 1.027 (ref 1.001–1.035)
Urine pH: 7 (ref 5.0–8.0)
Urobilinogen, UA: NORMAL mg/dL

## 2016-08-08 LAB — I-STAT CG4 VENOUS CARTRIDGE
Lactic Acid I-Stat: 1.5 mmol/L (ref 0.2–2.0)
i-STAT Base Excess Venous: 2 mEq/L
i-STAT FIO2: 21
i-STAT HCO3 Bicarbonate Venous: 23.2 mEq/L
i-STAT O2 Saturation Venous: 39 %
i-STAT Patient Temperature: 98.3
i-STAT Total CO2 Venous: 24 mEq/L
i-STAT pCO2 Venous: 26.4
i-STAT pH Venous: 7.551
i-STAT pO2 Venous: 19

## 2016-08-08 LAB — PT AND APTT
PT INR: 1 (ref 0.9–1.1)
PT: 13.5 s (ref 12.6–15.0)
PTT: 23 s (ref 23–37)

## 2016-08-08 LAB — I-STAT TROPONIN: i-STAT Troponin: 0.01 ng/mL (ref 0.00–0.09)

## 2016-08-08 LAB — GFR: EGFR: 33.6

## 2016-08-08 MED ORDER — IOHEXOL 350 MG/ML IV SOLN
140.0000 mL | Freq: Once | INTRAVENOUS | Status: AC | PRN
Start: 2016-08-08 — End: 2016-08-08
  Administered 2016-08-08: 140 mL via INTRAVENOUS

## 2016-08-08 MED ORDER — GLUCAGON 1 MG IJ SOLR (WRAP)
1.0000 mg | INTRAMUSCULAR | Status: DC | PRN
Start: 2016-08-08 — End: 2016-08-22

## 2016-08-08 MED ORDER — LORAZEPAM 2 MG/ML IJ SOLN
1.0000 mg | Freq: Once | INTRAMUSCULAR | Status: AC
Start: 2016-08-08 — End: 2016-08-08
  Administered 2016-08-08: 1 mg via INTRAVENOUS
  Filled 2016-08-08: qty 1

## 2016-08-08 MED ORDER — ASPIRIN 81 MG PO CHEW
324.0000 mg | CHEWABLE_TABLET | Freq: Once | ORAL | Status: DC
Start: 2016-08-08 — End: 2016-08-09
  Filled 2016-08-08: qty 4

## 2016-08-08 MED ORDER — ONDANSETRON HCL 4 MG/2ML IJ SOLN
4.0000 mg | Freq: Four times a day (QID) | INTRAMUSCULAR | Status: DC | PRN
Start: 2016-08-08 — End: 2016-08-21

## 2016-08-08 MED ORDER — GLUCOSE 40 % PO GEL
15.0000 g | ORAL | Status: DC | PRN
Start: 2016-08-08 — End: 2016-08-22

## 2016-08-08 MED ORDER — HYDRALAZINE HCL 20 MG/ML IJ SOLN
5.0000 mg | Freq: Four times a day (QID) | INTRAMUSCULAR | Status: DC | PRN
Start: 2016-08-08 — End: 2016-08-11
  Administered 2016-08-10: 5 mg via INTRAVENOUS
  Filled 2016-08-08: qty 1

## 2016-08-08 MED ORDER — ACETAMINOPHEN 650 MG RE SUPP
650.0000 mg | RECTAL | Status: DC | PRN
Start: 2016-08-08 — End: 2016-08-22

## 2016-08-08 MED ORDER — SODIUM CHLORIDE 0.9 % IJ SOLN
3.0000 mL | Freq: Three times a day (TID) | INTRAMUSCULAR | Status: DC
Start: 2016-08-08 — End: 2016-08-13
  Administered 2016-08-09 – 2016-08-12 (×7): 3 mL via INTRAVENOUS

## 2016-08-08 MED ORDER — HEPARIN SODIUM (PORCINE) 5000 UNIT/ML IJ SOLN
5000.0000 [IU] | Freq: Three times a day (TID) | INTRAMUSCULAR | Status: DC
Start: 2016-08-08 — End: 2016-08-13
  Administered 2016-08-08 – 2016-08-13 (×10): 5000 [IU] via SUBCUTANEOUS
  Filled 2016-08-08 (×9): qty 1

## 2016-08-08 MED ORDER — ACETAMINOPHEN 325 MG PO TABS
650.0000 mg | ORAL_TABLET | ORAL | Status: DC | PRN
Start: 2016-08-08 — End: 2016-08-21
  Administered 2016-08-08: 650 mg via ORAL
  Administered 2016-08-12: 325 mg via ORAL
  Administered 2016-08-14 – 2016-08-19 (×7): 650 mg via ORAL
  Filled 2016-08-08 (×4): qty 2
  Filled 2016-08-08: qty 1
  Filled 2016-08-08 (×4): qty 2

## 2016-08-08 MED ORDER — ONDANSETRON 4 MG PO TBDP
4.0000 mg | ORAL_TABLET | Freq: Four times a day (QID) | ORAL | Status: DC | PRN
Start: 2016-08-08 — End: 2016-08-21

## 2016-08-08 MED ORDER — ATORVASTATIN CALCIUM 10 MG PO TABS
40.0000 mg | ORAL_TABLET | Freq: Every evening | ORAL | Status: DC
Start: 2016-08-08 — End: 2016-08-21
  Administered 2016-08-08 – 2016-08-20 (×13): 40 mg via ORAL
  Filled 2016-08-08: qty 1
  Filled 2016-08-08: qty 2
  Filled 2016-08-08: qty 1
  Filled 2016-08-08 (×4): qty 2
  Filled 2016-08-08: qty 1
  Filled 2016-08-08 (×3): qty 2
  Filled 2016-08-08 (×2): qty 1
  Filled 2016-08-08: qty 2

## 2016-08-08 MED ORDER — SODIUM CHLORIDE 0.9 % IV MBP
1.0000 g | INTRAVENOUS | Status: AC
Start: 2016-08-08 — End: 2016-08-12
  Administered 2016-08-08 – 2016-08-12 (×5): 1 g via INTRAVENOUS
  Filled 2016-08-08 (×5): qty 1000

## 2016-08-08 MED ORDER — SODIUM CHLORIDE 0.9 % IV BOLUS
30.0000 mL/kg | Freq: Once | INTRAVENOUS | Status: AC
Start: 2016-08-08 — End: 2016-08-08
  Administered 2016-08-08: 1950 mL via INTRAVENOUS

## 2016-08-08 MED ORDER — SODIUM CHLORIDE 0.9 % IV SOLN
INTRAVENOUS | Status: AC
Start: 2016-08-08 — End: 2016-08-08

## 2016-08-08 MED ORDER — INSULIN LISPRO 100 UNIT/ML SC SOLN
1.0000 [IU] | Freq: Three times a day (TID) | SUBCUTANEOUS | Status: DC | PRN
Start: 2016-08-08 — End: 2016-08-14
  Administered 2016-08-09: 4 [IU] via SUBCUTANEOUS
  Administered 2016-08-09: 1 [IU] via SUBCUTANEOUS
  Administered 2016-08-10: 2 [IU] via SUBCUTANEOUS
  Administered 2016-08-11: 1 [IU] via SUBCUTANEOUS
  Administered 2016-08-11: 5 [IU] via SUBCUTANEOUS
  Administered 2016-08-12: 2 [IU] via SUBCUTANEOUS
  Administered 2016-08-13: 1 [IU] via SUBCUTANEOUS
  Administered 2016-08-13: 2 [IU] via SUBCUTANEOUS
  Administered 2016-08-13 – 2016-08-14 (×2): 3 [IU] via SUBCUTANEOUS
  Administered 2016-08-14: 1 [IU] via SUBCUTANEOUS
  Filled 2016-08-08 (×3): qty 3

## 2016-08-08 MED ORDER — DEXTROSE 10 % IV BOLUS
125.0000 mL | INTRAVENOUS | Status: DC | PRN
Start: 2016-08-08 — End: 2016-08-22

## 2016-08-08 MED ORDER — ASPIRIN 81 MG PO CHEW
81.0000 mg | CHEWABLE_TABLET | Freq: Every day | ORAL | Status: DC
Start: 2016-08-09 — End: 2016-08-10
  Administered 2016-08-09 – 2016-08-10 (×2): 81 mg via ORAL
  Filled 2016-08-08 (×2): qty 1

## 2016-08-08 NOTE — Stroke Progress Note (Signed)
Background:   80 y.o. female presents to the emergency department with complaint(s) of No chief complaint on file.      Initial NIHSS completed at 1225.    Last known well is at last night.  Patient had renal stent placed on Wednesday after hx of UTIs. She was last normal last night and woke at 1030 confused with trouble talking. For ems pt had low grade fever and tachycardic in 100s. They also appreciated right sided deficits and trouble speaking  On exam patient is awake. She has neglect to the right side. And no usable speech. She can mimic simple commands. Moving all extremities.  At baseline pt ambulates with a walker and lives with son because she needs reminder to take medication but she can be left for all day while son goes to work.     Past Medical History:   Diagnosis Date   . Arthritis    . Depression    . Difficulty walking     unsteady gait uses walker/cane/wheelchair   . Hyperlipidemia    . Hypertension    . Type 2 diabetes mellitus, controlled    . Urinary tract infection     multiple time on Nitrofurantion maintaining dose        Plan:  NIHSS = 10, mRS: 2 = Slight disability; unable to carry out all previous activities, but able to look after own affairs without assistance..  Patient's past medical history and tPA exclusion criteria reviewed.  Case discussed with  Dr. Salley Scarlet  (ED Attending).  Patient is not a candidate for tPA.    tPA administration:    tPA bolus: N/A mg given at N/A.    tPA infusion: N/A mg/hr started at N/A.    Given patients NIHSS and mRS, patient does not meets criteria for interventional radiology.    Delays (if any):Other: no delays    Consult Phone calls:    Neurologist paged/called @ on pt arrival.  Spoke with Dr. Janee Morn regarding case.      Dr Janee Morn, neurology, reviewed imaging, no large vessel occlusion. Given findings pt is not a candidate for IR. To be admitted for stroke work up.

## 2016-08-08 NOTE — Progress Notes (Signed)
08/08/16 1745   CM Review   Acknowledgment of Outpatient/Observation Observation letter given

## 2016-08-08 NOTE — Plan of Care (Signed)
Pt admitted from ED around 6:45 PM. Pt aphasic, says "yes" or "no" to questions asked. Confused and restless. Request for Avasys in place. IVF running @ 125 ml/hr. NSR-sinus tachy on tele. Fall precautions in place. Call bell within reach. Will continue to monitor for safety.

## 2016-08-08 NOTE — Consults (Signed)
[facility] Consultation Note                                       Date Time: 08/08/16   Patient Name: [name]  Requesting Physician: Gildardo Griffes, DO  Date of Admission: 08/08/2016    Reason for Consult: acute stroke alert    CC: difficulty speaking    Assessment:   80 yo F visiting from [place] with h/o DM, HTN, HLD, recurrent UTIs s/p renal stent 08/05/16, on ASA 81mg , found aphasic on AM of 08/08/16. Partial L MCA syndrome on arrival to [facility], with NIHSS 10. Was out of window for IV-tPA. No LVO on CTA.     1. Partial L MCA syndrome - suspicious for acute embolic stroke      Symptoms/findings not fluctuating or improving sufficiently to raise concern for seizure      Mechanism of stroke is probably embolic, and given lack of significant large vessel disease on CTA, source is suspected to be cardioembolic potentially occult Afib    2. Recent renal stent 08/05/16    3. H/o DM, HTN, HLD    Recommendations:   - Routine brain MRI wo contrast. No need for MRA as she already got a CTA in ED.      If cannot be cleared for MRI due to fresh renal stent, would obtain repeat CT head tomorrow  - EKG, telemetry looking for Afib  - Check lipids, A1c  - Check TTE no bubble study needed  - Continue her home ASA 81mg  daily for now, for secondary stroke prevention. Will consider switch to Plavix.   - Continue statin for secondary stroke prevention  - PT/OT/SLP evals    Will continue to follow.  Please call with questions/concerns (Neurology [phone]).     Peggye Form, MD  Neurohospitalist, [facility], Spectralink [phone]  Neurology Consults: Days- Spectralink [phone] (8a-4:30p), Nights- [phone]    HPI   Glenda [name] is a 80 y.o. female visiting from [place] with h/o DM, HTN, HLD, on ASA 81, recurrent UTIs, just had renal stent 08/05/16 (was off ASA prior to it and restarted the day after) who was last normal going to bed yesterday ~2200 and found this morning at ~1030 by her son struggling  to put on her shirt and unable to respond properly - not able to get her words out or understand him. EMS called and brought to [facility] ED. See my initial stroke alert note for timeline of acute stroke pathway.     Upon arrival pt was noted to be globally aphasic by stroke RN, not speaking, not following commands, and clearly neglecting her R side, with L gaze preference. Sent for stat CT/CTA/CTP per wake up stroke protocol. I saw her immediately out of the scanner, at which time she was able to get out an intermittent word, follow a few simple commands, and had just very slight RUE pronator drift. NIHSS 10. CTA did not show LVO therefore no thrombectomy to consider. She was out of window for IV-tPA.      Her 2 sons were present in the ED. She lives in [place] with one of her sons and his family. Her other son lives here in [place] and during a recent visit to this region found an urologist she liked so decided to have a renal stent placed locally here. That was done 3 days ago, on  9/13 - the son she lives with accompanied her back to this area. She was off her ASA before the procedure, and resumed the day after.       At baseline, she ambulates with a walker for safety given she has toe amputations related to her diabetes. She lives in a basement level room of her son's house and is mostly independent, joins them for meals. Family supervises her medications - reminds her to take them because when she was living alone her biggest issue was forgetting to take her medications including insulin. Per sons, she has no major cognitive issues, likes watching Jeopardy and rattles off answers easily. No known neurologic history.     Outpatient neurologist: none    Past Medical Hx     Past Medical History:   Diagnosis Date   . Arthritis    . Depression    . Difficulty walking     unsteady gait uses walker/cane/wheelchair   . Hyperlipidemia    . Hypertension    . Kidney disorder    . Type 2 diabetes mellitus, controlled    .  Urinary tract infection     multiple time on Nitrofurantion maintaining dose          Past Surgical Hx:     Past Surgical History:   Procedure Laterality Date   . CORONARY ARTERY BYPASS  2010   . CYSTOSCOPY, URETERAL STENT INSERTION  08/05/2016    Procedure: CYSTOSCOPY,RIGHT/RETROGRADE, RIGHT/ URETEROSCOPY, STENT PLACEMENT.;  Surgeon: Otelia Santee, MD;  Location: [facility] OR;  Service: Urology;;  CYSTOSCOPY,RIGHT/RETROGRADE, RIGHT/ URETEROSCOPY, STENT PLACEMENT.   Marland Kitchen FOOT AMPUTATION  2015    x2 three toes   . JOINT REPLACEMENT  2010    left knee        Family Medical History:    noncontributory    Social Hx     Social History     Social History   . Marital status: Widowed     Spouse name: N/A   . Number of children: N/A   . Years of education: N/A     Occupational History   . Not on file.     Social History Main Topics   . Smoking status: Never Smoker   . Smokeless tobacco: Never Used   . Alcohol use No   . Drug use: Unknown   . Sexual activity: Not on file     Other Topics Concern   . Not on file     Social History Narrative   . No narrative on file       Meds   Home :   Prior to Admission medications    Medication Sig Start Date End Date Taking? Authorizing Provider   aspirin EC 81 MG EC tablet Take 81 mg by mouth daily.    [provider]   atorvastatin (LIPITOR) 40 MG tablet Take 40 mg by mouth daily.    [provider]   DULoxetine (CYMBALTA) 60 MG capsule Take 60 mg by mouth daily.    [provider]   ferrous sulfate 325 (65 FE) MG tablet Take 325 mg by mouth every morning with breakfast.    [provider]   Insulin Glargine (LANTUS SC) Inject 10 Unit into the skin nightly.Took 5 units 08/04/16        [provider]   liraglutide (VICTOZA) 18 MG/3ML Solution Pen-injector Inject 1.8 each into the skin daily.        [provider]   metoprolol XL (TOPROL-XL) 25 MG 24 hr tablet Take 25 mg by mouth daily.    [provider]   nitrofurantoin  (MACRODANTIN) 50 MG capsule Take 2 capsules (100 mg total) by mouth every 12 (twelve) hours. 08/05/16   Tescher, Hoyle Sauer, MD   Probiotic Product (PROBIOTIC ADVANCED PO) Take by mouth.    [provider]     Allergies    Tape      Review of Systems   All systems reviewed and are negative, except for those mentioned in the HPI    Physical Exam:   Temp:  [98.3 F (36.8 C)-99.2 F (37.3 C)] 99.2 F (37.3 C)  Heart Rate:  [89-104] 98  Resp Rate:  [16-20] 20  BP: (145-194)/(65-106) 194/106     Vital Signs: reviewed.     General: well developed, well nourished elderly woman in no acute distress. Cooperative with exam.  Neck: supple, no bruits  CVS: rrr, no m/r/g  Resp: breathing comfortably on room air  Abd: Soft, nondistended  Extremities: no pedal edema, extremities normal in color    Mental Status:   Alert and attentive, more towards her left.   Able to say her name "Glenda [name]" but not able to answer other orientation questions.   Able to intermittently get a word or two out, appropriately. Frustrated by her expressive difficulties.   Able to follow a few simple commands and will mimic others.   Speech is sparse but seems just mildly dysarthric.     Cranial nerves:   -CN II: PERRL. Visual fields appear full to blink to threat.   -CN III, IV, VI: L gaze preference but crosses midline easily to stimulus. No nystagmus.    -CN V: Unable to assess.   -CN VII: Face symmetric, no weakness.   -CN VIII: Hearing intact to conversational speech.  -CN IX, X: Unable to assess  -CN XI: Unable to assess   -CN XII: Tongue protrudes midline. No lacerations/ecchymotic areas.     Motor: Muscle bulk normal. Muscle tone normal. Slight RUE pronation with very slight downward drift. Strength seems to be 5/5 throughout UE and LE.     Sensory: grimaces to noxious stim x4. Mild R hemineglect.     Reflexes: Toes downgoing bilaterally. DTRs hyporrefleive throughout UE and LE.     Coordination: FTN intact bilaterally. RAMs intact  bilaterally.    Gait: Deferred in the ED    NIHSS:                1a  Level of consciousness: 0=alert; keenly responsive   1b. LOC questions:  2=Performs neither task correctly   1c. LOC commands: 0=Performs both tasks correctly   2.  Best Gaze: 1=partial gaze palsy   3. Visual: 2=Complete hemianopia   4. Facial Palsy: 0=Normal symmetric movement   5a. Motor left arm: 0 = No drift; holds 90 degrees for full 10 seconds   5b.  Motor right arm: 1 = Drift; holds 90 degrees but drifts down before full 10 seconds, does not hit bed   6a. Motor left leg: 0=No drift, limb holds 30 degrees for full 5 seconds   6b  Motor right leg:  0=No drift, limb holds 30 degrees for full 5 seconds   7. Limb Ataxia: 0=Absent   8.  Sensory: 0=Normal; no sensory loss   9. Best Language:  2=Severe aphasia   10. Dysarthria: 1=Mild-to-moderate dysarthria   11. Extinction and Inattention: 1=Visual, tactile, auditory,  spatial or personal inattention    Total: 10     Labs:     Results     Procedure Component Value Units Date/Time    Blood Culture Aerobic/Anaerobic #2 [[id number]] Collected:  08/08/16 1406    Specimen:  Arm from Blood Updated:  08/08/16 1626    Narrative:       1 BLUE+1 PURPLE    UA, Reflex to Microscopic (pts  3 + yrs) [[id number]]  (Abnormal) Collected:  08/08/16 1511    Specimen:  Urine Updated:  08/08/16 1612     Urine Type Clean Catch     Color, UA Yellow     Clarity, UA Cloudy (A)     Specific Gravity UA 1.027     Urine pH 7.0     Leukocyte Esterase, UA Large (A)     Nitrite, UA Negative     Protein, UR 100 (A)     Glucose, UA 50 (A)     Ketones UA Negative     Urobilinogen, UA Normal mg/dL      Bilirubin, UA Negative     Blood, UA Large (A)     RBC, UA TNTC (A) /hpf      WBC, UA TNTC (A) /hpf      Squamous Epithelial Cells, Urine 6 - 10 /hpf     Urine culture [[id number]] Collected:  08/08/16 1513    Specimen:  Urine from Urine, Catheterized, In & Out Updated:  08/08/16 1526    i-Stat Troponin [[id number]] Collected:  08/08/16  1417     Updated:  08/08/16 1439     i-STAT Troponin 0.01 ng/mL     Blood Culture Aerobic/Anaerobic #1 [[id number]] Collected:  08/08/16 1318    Specimen:  Arm from Blood Updated:  08/08/16 1439    Narrative:       1 BLUE+1 PURPLE    i-Stat CG4 Venous CartrIDge [[id number]] Collected:  08/08/16 1417     Updated:  08/08/16 1422     i-STAT pH Venous 7.551     i-STAT pCO2 Venous 26.4     i-STAT pO2 Venous 19.0     i-STAT HCO3 Bicarbonate Venous 23.2 mEq/L      i-STAT Total CO2 Venous 24.0 mEq/L      i-STAT Base Excess Venous 2.0 mEq/L      i-STAT O2 Saturation Venous 39.0 %      i-STAT Lactic acid 1.5 mmol/L      i-STAT Patient Temperature 98.3     i-STAT FIO2 21     i-STAT O2 Delivery Room Air     i-STAT Allen's Test NA     i-STAT ECMO No     i-STAT Draw Site Venous    GFR [[id number]] Collected:  08/08/16 1318     Updated:  08/08/16 1356     EGFR 33.6    Comprehensive metabolic panel [[id number]]  (Abnormal) Collected:  08/08/16 1318    Specimen:  Blood Updated:  08/08/16 1356     Glucose 136 (H) mg/dL      BUN 75.6 (H) mg/dL      Creatinine 1.5 (H) mg/dL      Sodium 433 (L) mEq/L      Potassium 4.8 mEq/L      Chloride 99 (L) mEq/L      CO2 22 mEq/L      Calcium 8.7 mg/dL      Protein, Total 6.4 g/dL      Albumin 3.3 (L) g/dL  AST (SGOT) 13 U/L      ALT 13 U/L      Alkaline Phosphatase 104 U/L      Bilirubin, Total 0.6 mg/dL      Globulin 3.1 g/dL      Albumin/Globulin Ratio 1.1    PT/APTT [[id number]] Collected:  08/08/16 1318     Updated:  08/08/16 1356     PT 13.5 sec      PT INR 1.0     PT Anticoag. Given Within 48 hrs. None     PTT 23 sec     CBC with differential [[id number]]  (Abnormal) Collected:  08/08/16 1318    Specimen:  Blood from Blood Updated:  08/08/16 1345     WBC 11.86 (H) x10 3/uL      Hgb 11.3 (L) g/dL      Hematocrit 78.2 (L) %      Platelets 315 x10 3/uL      RBC 4.12 (L) x10 6/uL      MCV 86.2 fL      MCH 27.4 (L) pg      MCHC 31.8 (L) g/dL      RDW 14 %      MPV 9.6 fL      Neutrophils 78.2 %       Lymphocytes Automated 13.7 %      Monocytes 6.3 %      Eosinophils Automated 0.6 %      Basophils Automated 0.7 %      Immature Granulocyte 0.5 %      Nucleated RBC 0.0 /100 WBC      Neutrophils Absolute 9.28 (H) x10 3/uL      Abs Lymph Automated 1.62 x10 3/uL      Abs Mono Automated 0.75 x10 3/uL      Abs Eos Automated 0.07 x10 3/uL      Absolute Baso Automated 0.08 x10 3/uL      Absolute Immature Granulocyte 0.06 (H) x10 3/uL      Absolute NRBC 0.00 x10 3/uL           Rads:     Results for orders placed or performed during the hospital encounter of 08/08/16   CT Head W Contrast    Narrative    HISTORY: 80 years old,STROKE, Aphagia, 20 40 cc Omnipaque 350 injected              COMPARISON: non contrast Head CT dated same day.    CT Head withcontrast:  Focal abnormal appearance of the right medial occipital cortex, and the  left inferior cerebellar cortex findings include ischemia,  age-indeterminate but likely old.  Images are limited due to patient motion.       CT perfusion with and without contrast:  There may be small focal area of mismatch defect involving the posterior  aspect of left MCA territory with slightly increased MTT and TTP  abnormality without corresponding blood volume defect.    CT angiogram INTRACRANIAL with contrast, CT angiogram EXTRACRANIAL with  contrast:   Right anterior circulation: No occlusion or thrombosis. Atherosclerotic  disease evident with mild to moderate narrowing involving the cavernous  and supraclinoid internal carotid artery.  Right Carotid: No occlusion or thrombosis. Calcific plaque causing less  than 30% stenosis of the right carotid bulb appear    Left anterior circulation:  No occlusion or thrombosis. Mild to moderate  narrowing involving the left cavernous and supraclinoid internal carotid  artery due to atherosclerotic calcification  Left Carotid:  No occlusion or thrombosis.         Posterior circulation: No occlusion or thrombosis.      Right Vertebral: No  occlusion or thrombosis.      Left Vertebral: No occlusion or thrombosis.          Impression    1.  Possible very subtle perfusion abnormality about the posterior  aspect of left MCA territory. No definite thrombosis or definite  penumbra.    2.  Lucency evident involving the right medial occipital cortex and the  left cerebellar cortex, consistent with ischemia, age-indeterminate but  likely old.    3.  Atherosclerotic stenosis as noted above.         Einar Pheasant, MD   08/08/2016 1:23 PM     CT Head WO Contrast    Narrative    HISTORY: 80 years old,CEREBRAL ISCHEMIA, right-sided neglect         TECHNIQUE: Routine  CT head without contrast.  Dose reduction with automatic exposure control, iterative  reconstruction, and/or adjustment of the mA and/or kV according to  patient size.  FINDINGS:   Lucency evident involving the left cerebellar cortex in the right medial  occipital cortex, findings consistent with infarction, likely old.  The grey-white junction appear otherwise normal and no evidence of acute  ischemia.  There is no mass, mass effect, hemorrhage, or extra-axial fluid  collection.   The ventricles and the basal cisterns are unremarkable.   The paranasal sinuses are clear.              Impression          Lucency of the left cerebellar cortex in the right medial occipital  cortex, findings consistent with infarction, age indeterminant but  likely old.        Einar Pheasant, MD   08/08/2016 12:49 PM           Signed by:  Peggye Form

## 2016-08-08 NOTE — Progress Notes (Signed)
[facility] NEUR[facility] ACUTE STROKE ALERT NOTE  Full consult to follow      Neurology notified: 1221 pre-arrival.     LAST KNOWN WELL: 2200 bedtime    HPI: 80 yo F from [place] here with family, h/o DM, recurrent UTIs, s/p renal stent 08/05/16 in [place], found this morning ~1030 in her room struggling to put on her shirt, not speaking or responding properly but able to move her limbs. Sons present at bedside.       Antithrombotics: ASA daily       MRS: ~3. Ambulates with walker for safety given her h/o toe amputations. Lives in basement level of son's house, fairly independently, but family helps remind her to take her medications. No significant cognitive concerns.     Arrival time to ED: 1225    NIHSS:  10 upon evaluation immediately after CT    CT head: Start: 1234  Read: no definite acute process    IV-tPA Not Given: contraindicated based on last known well the night prior    CTA/P:  Start: immediately after CTH.   Read: per my prelim review patent large vessels up through M1s, pending radiology review    Neuro IR contacted? No, no LVO    [facility] contacted? No     NIHSS:    1a  Level of consciousness: 0=alert; keenly responsive   1b. LOC questions:  2=Performs neither task correctly   1c. LOC commands: 0=Performs both tasks correctly   2.  Best Gaze: 1=partial gaze palsy   3. Visual: 2=Complete hemianopia   4. Facial Palsy: 0=Normal symmetric movement   5a. Motor left arm: 0 = No drift; holds 90 degrees for full 10 seconds   5b.  Motor right arm: 1 = Drift; holds 90 degrees but drifts down before full 10 seconds, does not hit bed   6a. Motor left leg: 0=No drift, limb holds 30 degrees for full 5 seconds   6b  Motor right leg:  0=No drift, limb holds 30 degrees for full 5 seconds   7. Limb Ataxia: 0=Absent   8.  Sensory: 0=Normal; no sensory loss   9. Best Language:  2=Severe aphasia   10. Dysarthria: 1=Mild-to-moderate dysarthria   11. Extinction and Inattention: 1=Visual, tactile, auditory, spatial or personal  inattention    Total: 10     PLAN:  - not a candidate for any acute stroke treatment options  - admit to [facility] hospitalist   - remainder of stroke work-up: MRI brain wo contrast (if able - she has a fresh renal stent), EKG/telemetry, lipids, A1c, TTE no bubble  - ASA 81mg  daily and statin for secondary stroke prevention  - full consult to follow    Peggye Form, MD  Neurohospitalist, [facility], Spectralink [phone]  Neurology Consults: Days- Spectralink [phone] (8a-4:30p), Nights- [phone]

## 2016-08-08 NOTE — ED Notes (Signed)
Bed: S 6  Expected date:   Expected time:   Means of arrival:   Comments:  Medic 429

## 2016-08-08 NOTE — H&P (Signed)
HOSPITALIST ADMISSION HISTORY AND PHYSICAL EXAM    Date Time: 08/08/16 5:22 PM  Patient Name: Glenda Glenda [name]  Attending Physician: Glenda Griffes, DO  Primary Care Physician: Glenda See, MD    CC: motor weakness and aphasia      History of Presenting Illness:   This is Glenda [name] 80yo (from [place], lives with family) with PMHx of DM2, HTN, HLD Recurrent UTIs (s/p Renal Stent), was found this morning  at about 1030 in her room struggling to put on her shirt, not speaking or responding properly. She was able to move her limbs. Sons present at bedside. At baseline, she ambulates with walker for safety (because of toe amputations). She is fairly independent on her own. Family helps remind her to take her medications. No chest pain, SOB, abdominal pain, N/V/D.    In the ER  - Stroke alert called - neurologist determined she is NOT Glenda [name] stroke candidate   - CT Brain = Possible very subtle perfusion abnormality about the posterior aspect of left MCA territory. No definite thrombosis or definite penumbra.       Past Medical History:     Past Medical History:   Diagnosis Date   . Arthritis    . Depression    . Difficulty walking     unsteady gait uses walker/cane/wheelchair   . Hyperlipidemia    . Hypertension    . Kidney disorder    . Type 2 diabetes mellitus, controlled    . Urinary tract infection     multiple time on Nitrofurantion maintaining dose       Past Surgical History:     Past Surgical History:   Procedure Laterality Date   . CORONARY ARTERY BYPASS  2010   . CYSTOSCOPY, URETERAL STENT INSERTION  08/05/2016    Procedure: CYSTOSCOPY,RIGHT/RETROGRADE, RIGHT/ URETEROSCOPY, STENT PLACEMENT.;  Surgeon: Glenda Santee, MD;  Location: [facility] OR;  Service: Urology;;  CYSTOSCOPY,RIGHT/RETROGRADE, RIGHT/ URETEROSCOPY, STENT PLACEMENT.   Marland Kitchen FOOT AMPUTATION  2015    x2 three toes   . JOINT REPLACEMENT  2010    left knee       Family History:   History reviewed. No pertinent family history.    Social History:     Social  History     Social History   . Marital status: Widowed     Spouse name: N/Glenda [name]   . Number of children: N/Glenda [name]   . Years of education: N/Glenda [name]     Social History Main Topics   . Smoking status: Never Smoker   . Smokeless tobacco: Never Used   . Alcohol use No   . Drug use: Unknown   . Sexual activity: Not on file     Other Topics Concern   . Not on file     Social History Narrative   . No narrative on file       Allergies:     Allergies   Allergen Reactions   . Tape Itching       Medications:     (Not in Glenda [name] hospital admission)    Current Facility-Administered Medications   Medication Dose Route Frequency   . aspirin  324 mg Oral Once   . [START ON 08/09/2016] aspirin  81 mg Oral Daily   . atorvastatin  40 mg Oral QHS   . heparin (porcine)  5,000 Units Subcutaneous Q8H SCH   . sodium chloride (PF)  3 mL Intravenous Q8H       Review of Systems:  All other systems were reviewed and are negative except: as above    Physical Exam:     Vitals:    08/08/16 1330   BP: 161/80   Pulse: 89   Resp: 16   Temp: 98.3 F (36.8 C)   SpO2: 99%       Intake and Output Summary (Last 24 hours) at Date Time  No intake or output data in the 24 hours ending 08/08/16 1722    General: awake, alert, oriented x 0; no acute distress, but she is actively moving her face, arm, legs  HEENT: perrla, eomi, sclera anicteric  oropharynx clear without lesions, mucous membranes moist  Neck: supple, no lymphadenopathy, no thyromegaly, no JVD, no carotid bruits  Cardiovascular: regular rate and rhythm, no murmurs, rubs or gallops  Lungs: clear to auscultation bilaterally, without wheezing, rhonchi, or rales  Abdomen: soft, non-tender, non-distended; no palpable masses, no hepatosplenomegaly, normoactive bowel sounds, no rebound or guarding  Extremities: no clubbing, cyanosis, or edema  Neuro: cranial nerves grossly intact, moves all extremities - but cannot cooperate at all with neuro exam  Skin: no rashes or lesions noted        Labs:     Results     Procedure  Component Value Units Date/Time    Blood Culture Aerobic/Anaerobic #2 [[id number]] Collected:  08/08/16 1406    Specimen:  Arm from Blood Updated:  08/08/16 1626    Narrative:       1 BLUE+1 PURPLE    UA, Reflex to Microscopic (pts  3 + yrs) [[id number]]  (Abnormal) Collected:  08/08/16 1511    Specimen:  Urine Updated:  08/08/16 1612     Urine Type Clean Catch     Color, UA Yellow     Clarity, UA Cloudy (Glenda [name])     Specific Gravity UA 1.027     Urine pH 7.0     Leukocyte Esterase, UA Large (Glenda [name])     Nitrite, UA Negative     Protein, UR 100 (Glenda [name])     Glucose, UA 50 (Glenda [name])     Ketones UA Negative     Urobilinogen, UA Normal mg/dL      Bilirubin, UA Negative     Blood, UA Large (Glenda [name])     RBC, UA TNTC (Glenda [name]) /hpf      WBC, UA TNTC (Glenda [name]) /hpf      Squamous Epithelial Cells, Urine 6 - 10 /hpf     Urine culture [[id number]] Collected:  08/08/16 1513    Specimen:  Urine from Urine, Catheterized, In & Out Updated:  08/08/16 1526    i-Stat Troponin [[id number]] Collected:  08/08/16 1417     Updated:  08/08/16 1439     i-STAT Troponin 0.01 ng/mL     Blood Culture Aerobic/Anaerobic #1 [[id number]] Collected:  08/08/16 1318    Specimen:  Arm from Blood Updated:  08/08/16 1439    Narrative:       1 BLUE+1 PURPLE    i-Stat CG4 Venous CartrIDge [[id number]] Collected:  08/08/16 1417     Updated:  08/08/16 1422     i-STAT pH Venous 7.551     i-STAT pCO2 Venous 26.4     i-STAT pO2 Venous 19.0     i-STAT HCO3 Bicarbonate Venous 23.2 mEq/L      i-STAT Total CO2 Venous 24.0 mEq/L      i-STAT Base Excess Venous 2.0 mEq/L      i-STAT O2 Saturation Venous 39.0 %  i-STAT Lactic acid 1.5 mmol/L      i-STAT Patient Temperature 98.3     i-STAT FIO2 21     i-STAT O2 Delivery Room Air     i-STAT Allen's Test NA     i-STAT ECMO No     i-STAT Draw Site Venous    GFR [[id number]] Collected:  08/08/16 1318     Updated:  08/08/16 1356     EGFR 33.6    Comprehensive metabolic panel [[id number]]  (Abnormal) Collected:  08/08/16 1318    Specimen:  Blood Updated:  08/08/16  1356     Glucose 136 (H) mg/dL      BUN 78.2 (H) mg/dL      Creatinine 1.5 (H) mg/dL      Sodium 956 (L) mEq/L      Potassium 4.8 mEq/L      Chloride 99 (L) mEq/L      CO2 22 mEq/L      Calcium 8.7 mg/dL      Protein, Total 6.4 g/dL      Albumin 3.3 (L) g/dL      AST (SGOT) 13 U/L      ALT 13 U/L      Alkaline Phosphatase 104 U/L      Bilirubin, Total 0.6 mg/dL      Globulin 3.1 g/dL      Albumin/Globulin Ratio 1.1    PT/APTT [[id number]] Collected:  08/08/16 1318     Updated:  08/08/16 1356     PT 13.5 sec      PT INR 1.0     PT Anticoag. Given Within 48 hrs. None     PTT 23 sec     CBC with differential [[id number]]  (Abnormal) Collected:  08/08/16 1318    Specimen:  Blood from Blood Updated:  08/08/16 1345     WBC 11.86 (H) x10 3/uL      Hgb 11.3 (L) g/dL      Hematocrit 41.3 (L) %      Platelets 315 x10 3/uL      RBC 4.12 (L) x10 6/uL      MCV 86.2 fL      MCH 27.4 (L) pg      MCHC 31.8 (L) g/dL      RDW 14 %      MPV 9.6 fL      Neutrophils 78.2 %      Lymphocytes Automated 13.7 %      Monocytes 6.3 %      Eosinophils Automated 0.6 %      Basophils Automated 0.7 %      Immature Granulocyte 0.5 %      Nucleated RBC 0.0 /100 WBC      Neutrophils Absolute 9.28 (H) x10 3/uL      Abs Lymph Automated 1.62 x10 3/uL      Abs Mono Automated 0.75 x10 3/uL      Abs Eos Automated 0.07 x10 3/uL      Absolute Baso Automated 0.08 x10 3/uL      Absolute Immature Granulocyte 0.06 (H) x10 3/uL      Absolute NRBC 0.00 x10 3/uL           Radiology Results (24 Hour)     Procedure Component Value Units Date/Time    XR Chest  AP Portable [[id number]] Collected:  08/08/16 1329    Order Status:  Completed Updated:  08/08/16 1448    Narrative:       History: Altered mental  status.    COMPARISON: None    Portable AP chest: Poststernotomy changes identified. Monitoring leads  are present. The heart is top normal in size. Aorta is mildly calcific.  Pulmonary vasculature is unremarkable. There is dense mitral annular  calcification. Mild  basilar atelectasis is noted. There is no  consolidation or effusion. No pneumothorax. Degenerative changes are  noted in the spine and right shoulder.      Impression:        Hypoventilatory change, basilar atelectasis.    Geanie Cooley, MD   08/08/2016 1:32 PM      CT Angiogram Head [[id number]] Collected:  08/08/16 1307    Order Status:  Completed Updated:  08/08/16 1448    Narrative:       HISTORY: 80 years old,STROKE, Aphagia, 20 40 cc Omnipaque 350 injected              COMPARISON: non contrast Head CT dated same day.    CT Head withcontrast:  Focal abnormal appearance of the right medial occipital cortex, and the  left inferior cerebellar cortex findings include ischemia,  age-indeterminate but likely old.  Images are limited due to patient motion.       CT perfusion with and without contrast:  There may be small focal area of mismatch defect involving the posterior  aspect of left MCA territory with slightly increased MTT and TTP  abnormality without corresponding blood volume defect.    CT angiogram INTRACRANIAL with contrast, CT angiogram EXTRACRANIAL with  contrast:   Right anterior circulation: No occlusion or thrombosis. Atherosclerotic  disease evident with mild to moderate narrowing involving the cavernous  and supraclinoid internal carotid artery.  Right Carotid: No occlusion or thrombosis. Calcific plaque causing less  than 30% stenosis of the right carotid bulb appear    Left anterior circulation:  No occlusion or thrombosis. Mild to moderate  narrowing involving the left cavernous and supraclinoid internal carotid  artery due to atherosclerotic calcification  Left Carotid: No occlusion or thrombosis.         Posterior circulation: No occlusion or thrombosis.      Right Vertebral: No occlusion or thrombosis.      Left Vertebral: No occlusion or thrombosis.          Impression:         1.  Possible very subtle perfusion abnormality about the posterior  aspect of left MCA territory. No definite  thrombosis or definite  penumbra.    2.  Lucency evident involving the right medial occipital cortex and the  left cerebellar cortex, consistent with ischemia, age-indeterminate but  likely old.    3.  Atherosclerotic stenosis as noted above.         Einar Pheasant, MD   08/08/2016 1:23 PM      CT Angiogram Neck [[id number]] Collected:  08/08/16 1307    Order Status:  Completed Updated:  08/08/16 1448    Narrative:       HISTORY: 80 years old,STROKE, Aphagia, 20 40 cc Omnipaque 350 injected              COMPARISON: non contrast Head CT dated same day.    CT Head withcontrast:  Focal abnormal appearance of the right medial occipital cortex, and the  left inferior cerebellar cortex findings include ischemia,  age-indeterminate but likely old.  Images are limited due to patient motion.       CT perfusion with and without contrast:  There may be  small focal area of mismatch defect involving the posterior  aspect of left MCA territory with slightly increased MTT and TTP  abnormality without corresponding blood volume defect.    CT angiogram INTRACRANIAL with contrast, CT angiogram EXTRACRANIAL with  contrast:   Right anterior circulation: No occlusion or thrombosis. Atherosclerotic  disease evident with mild to moderate narrowing involving the cavernous  and supraclinoid internal carotid artery.  Right Carotid: No occlusion or thrombosis. Calcific plaque causing less  than 30% stenosis of the right carotid bulb appear    Left anterior circulation:  No occlusion or thrombosis. Mild to moderate  narrowing involving the left cavernous and supraclinoid internal carotid  artery due to atherosclerotic calcification  Left Carotid: No occlusion or thrombosis.         Posterior circulation: No occlusion or thrombosis.      Right Vertebral: No occlusion or thrombosis.      Left Vertebral: No occlusion or thrombosis.          Impression:         1.  Possible very subtle perfusion abnormality about the posterior  aspect of left MCA  territory. No definite thrombosis or definite  penumbra.    2.  Lucency evident involving the right medial occipital cortex and the  left cerebellar cortex, consistent with ischemia, age-indeterminate but  likely old.    3.  Atherosclerotic stenosis as noted above.         Einar Pheasant, MD   08/08/2016 1:23 PM      CT Angiogram Cerebral Perfusion W 3D Reconstruction [[id number]] Collected:  08/08/16 1307    Order Status:  Completed Updated:  08/08/16 1448    Narrative:       HISTORY: 80 years old,STROKE, Aphagia, 20 40 cc Omnipaque 350 injected              COMPARISON: non contrast Head CT dated same day.    CT Head withcontrast:  Focal abnormal appearance of the right medial occipital cortex, and the  left inferior cerebellar cortex findings include ischemia,  age-indeterminate but likely old.  Images are limited due to patient motion.       CT perfusion with and without contrast:  There may be small focal area of mismatch defect involving the posterior  aspect of left MCA territory with slightly increased MTT and TTP  abnormality without corresponding blood volume defect.    CT angiogram INTRACRANIAL with contrast, CT angiogram EXTRACRANIAL with  contrast:   Right anterior circulation: No occlusion or thrombosis. Atherosclerotic  disease evident with mild to moderate narrowing involving the cavernous  and supraclinoid internal carotid artery.  Right Carotid: No occlusion or thrombosis. Calcific plaque causing less  than 30% stenosis of the right carotid bulb appear    Left anterior circulation:  No occlusion or thrombosis. Mild to moderate  narrowing involving the left cavernous and supraclinoid internal carotid  artery due to atherosclerotic calcification  Left Carotid: No occlusion or thrombosis.         Posterior circulation: No occlusion or thrombosis.      Right Vertebral: No occlusion or thrombosis.      Left Vertebral: No occlusion or thrombosis.          Impression:         1.  Possible very subtle perfusion  abnormality about the posterior  aspect of left MCA territory. No definite thrombosis or definite  penumbra.    2.  Lucency evident involving the right  medial occipital cortex and the  left cerebellar cortex, consistent with ischemia, age-indeterminate but  likely old.    3.  Atherosclerotic stenosis as noted above.         Einar Pheasant, MD   08/08/2016 1:23 PM      CT Head W Contrast [[id number]] Collected:  08/08/16 1307    Order Status:  Completed Updated:  08/08/16 1448    Narrative:       HISTORY: 80 years old,STROKE, Aphagia, 20 40 cc Omnipaque 350 injected              COMPARISON: non contrast Head CT dated same day.    CT Head withcontrast:  Focal abnormal appearance of the right medial occipital cortex, and the  left inferior cerebellar cortex findings include ischemia,  age-indeterminate but likely old.  Images are limited due to patient motion.       CT perfusion with and without contrast:  There may be small focal area of mismatch defect involving the posterior  aspect of left MCA territory with slightly increased MTT and TTP  abnormality without corresponding blood volume defect.    CT angiogram INTRACRANIAL with contrast, CT angiogram EXTRACRANIAL with  contrast:   Right anterior circulation: No occlusion or thrombosis. Atherosclerotic  disease evident with mild to moderate narrowing involving the cavernous  and supraclinoid internal carotid artery.  Right Carotid: No occlusion or thrombosis. Calcific plaque causing less  than 30% stenosis of the right carotid bulb appear    Left anterior circulation:  No occlusion or thrombosis. Mild to moderate  narrowing involving the left cavernous and supraclinoid internal carotid  artery due to atherosclerotic calcification  Left Carotid: No occlusion or thrombosis.         Posterior circulation: No occlusion or thrombosis.      Right Vertebral: No occlusion or thrombosis.      Left Vertebral: No occlusion or thrombosis.          Impression:         1.  Possible  very subtle perfusion abnormality about the posterior  aspect of left MCA territory. No definite thrombosis or definite  penumbra.    2.  Lucency evident involving the right medial occipital cortex and the  left cerebellar cortex, consistent with ischemia, age-indeterminate but  likely old.    3.  Atherosclerotic stenosis as noted above.         Einar Pheasant, MD   08/08/2016 1:23 PM      CT Head WO Contrast [[id number]] Collected:  08/08/16 1247    Order Status:  Completed Updated:  08/08/16 1253    Narrative:       HISTORY: 80 years old,CEREBRAL ISCHEMIA, right-sided neglect         TECHNIQUE: Routine  CT head without contrast.  Dose reduction with automatic exposure control, iterative  reconstruction, and/or adjustment of the mA and/or kV according to  patient size.  FINDINGS:   Lucency evident involving the left cerebellar cortex in the right medial  occipital cortex, findings consistent with infarction, likely old.  The grey-white junction appear otherwise normal and no evidence of acute  ischemia.  There is no mass, mass effect, hemorrhage, or extra-axial fluid  collection.   The ventricles and the basal cisterns are unremarkable.   The paranasal sinuses are clear.              Impression:  Lucency of the left cerebellar cortex in the right medial occipital  cortex, findings consistent with infarction, age indeterminant but  likely old.        Einar Pheasant, MD   08/08/2016 12:49 PM              Assessment:     Patient Active Problem List   Diagnosis   . Hydronephrosis of right kidney   . CVA (cerebral vascular accident)       Plan:   # Motor weakness with Aphasia  - Will admit to observation  - Starting PO ASA 81mg  and Lipitor 40mg   - Place on cardiac monitor  - F/U 2D echo, MRI, MRA and rest of stroke work up    # UTI  - Starting IV Ceftriaxone in view of AMS and dirty urine  - F/U Urine and Blood cultures    # HTN  - Hold BP meds in view of permissive HTN for first 24 hours  - Continue to monitor  BP    # DM2  - Placing on SSI    Status/Rationale:    Anticipate observation stay for < 24hours.  DVT PPx: SQ Heparin  Diet: Cardiac diet (if passes dysphagia screen)    Signed by: Glenda Griffes, DO  cc:Pcp, Noneorunknown, MD

## 2016-08-08 NOTE — ED Provider Notes (Signed)
Physician/Midlevel provider first contact with patient: 08/08/16 1225          [facility] EMERGENCY DEPARTMENT H&P            CLINICAL SUMMARY          Diagnosis:    .     Final diagnoses:   Cerebrovascular accident (CVA), unspecified mechanism         MDM Notes:    MDM, Differential Diagnosis (not completely inclusive) and Plan: Right sided neglect and AMS, concern for stroke, also concern for infectious process given recent ureteral stent placement.  Will check labs, lactate, Cxs, Ua, Ucx, head CT, CXR, EKG.         Disposition:      ED Disposition     ED Disposition Condition Date/Time Comment    Observation  Sat Aug 08, 2016  3:49 PM Admitting Physician: Gildardo Griffes [[id number]]   Diagnosis: CVA (cerebral vascular accident) [[id number]]   Estimated Length of Stay: < 2 midnights   Tentative Discharge Plan?: Home or Self Care [1]   Patient Class: Observation [104]            New Prescriptions    No medications on file                    CLINICAL INFORMATION        HPI:      Chief Complaint: Altered Mental Status  .    Glenda [name] is a 80 y.o. female who presents with altered mental status. Pt was normal last night and woke up this am at approximately 1030 and was confused and disoriented. Per EMS, pt has R sided deficits and trouble speaking.     Pt had a renal stent placed 3 days ago for hx of UTIs.    History obtained from: patient, family, EMS  Caveat: HPI caveat invoked due to patient's mental status.      PMD   Pcp, Noneorunknown, MD      ROS:      Caveat: Unable to complete ROS due to patient mental status   -Nursing notes reviewed by me.         Physical Exam:      PE   BP: 145/65, Temp: 98.3 F (36.8 C), Temp Source: Oral, Heart Rate: 94, Resp Rate: 18, SpO2: 95 %, Height: 5\' 5"  (165.1 cm), Weight: 65 kg    Pulse Oximetry Analysis -95 % on room air; Normal  Vitals reviewed   GEN: . Nontoxic, Well appearing, NAD.      Head: NC/AT.        Eyes: EOMI w/o pain, nl conjunctiva. NO d/c.         ENT:  MMM, symmetric OP, no drooling/trismus.          Neck: FROM, supple, no masses, no tenderness.         Chest: ctab, NO resp distress. Equal chest rise.          CV: rrr, NO significant murmur appreciated.          Abd: no peritoneal signs, soft, benign, no distention, no tenderness             Back: NO CVAt, NO midline ttp.         UpperExt: NO deformity. FROM, neurovasc intact. 2+radial     LowerExt: NO edema. FROM, neurovasc intact. 2+DP         Neuro: right sided neglect, moves all extremities, following  simple commands, no facial asymmetry.     Skin: Warm and dry. NO rash. Cap refill <5sec       Psych: Normal affect. Normal insight.                      PAST HISTORY        Primary Care Provider: Christa See, MD        PMH/PSH:    .     Past Medical History:   Diagnosis Date   . Arthritis    . Depression    . Difficulty walking     unsteady gait uses walker/cane/wheelchair   . Hyperlipidemia    . Hypertension    . Kidney disorder    . Type 2 diabetes mellitus, controlled    . Urinary tract infection     multiple time on Nitrofurantion maintaining dose       She has a past surgical history that includes CORONARY ARTERY BYPASS (2010); Foot Amputation (2015); Joint replacement (2010); and CYSTOSCOPY, URETERAL STENT INSERTION (08/05/2016).      Social/Family History:      She reports that she has never smoked. She has never used smokeless tobacco. She reports that she does not drink alcohol. Her drug history is not on file.    History reviewed. No pertinent family history.      Listed Medications on Arrival:    .     Previous Medications    ASPIRIN EC 81 MG EC TABLET    Take 81 mg by mouth daily.    ATORVASTATIN (LIPITOR) 40 MG TABLET    Take 40 mg by mouth daily.    DULOXETINE (CYMBALTA) 60 MG CAPSULE    Take 60 mg by mouth daily.    FERROUS SULFATE 325 (65 FE) MG TABLET    Take 325 mg by mouth every morning with breakfast.    INSULIN GLARGINE (LANTUS SC)    Inject 10 Unit into the skin nightly.Took 5 units  08/04/16        LIRAGLUTIDE (VICTOZA) 18 MG/3ML SOLUTION PEN-INJECTOR    Inject 1.8 each into the skin daily.        METOPROLOL XL (TOPROL-XL) 25 MG 24 HR TABLET    Take 25 mg by mouth daily.    NITROFURANTOIN (MACRODANTIN) 50 MG CAPSULE    Take 2 capsules (100 mg total) by mouth every 12 (twelve) hours.    PROBIOTIC PRODUCT (PROBIOTIC ADVANCED PO)    Take by mouth.      Allergies: She is allergic to tape.            VISIT INFORMATION        Clinical Course in the ED:      ED Course      3:44 PM  Spoke with CNS Hospitalist. Will admit to neuro obs tele.       Medications Given in the ED:    .     ED Medication Orders     Start Ordered     Status Ordering Provider    08/08/16 1515 08/08/16 1514  aspirin chewable tablet 324 mg  Once     Route: Oral  Ordered Dose: 324 mg     Acknowledged Negan Grudzien L    08/08/16 1233 08/08/16 1232  sodium chloride 0.9 % bolus 1,950 mL  Once     Route: Intravenous  Ordered Dose: 30 mL/kg     Last MAR action:  New Bag Leiah Giannotti L  Procedures:      Procedures      Interpretations:      EKG as interpreted by me, Dr. Salley Scarlet: Sinus @ 91. Occasional PACs. T wave inversions in V1 and V2.      Cardiac monitor interpretation by me, Dr. Matthewjames Petrasek:Sinus in 86s  Radiologic study results reviewed by EDP: YES  Radiologic Studies Interpreted (viewed) by me, Dr. Salley Scarlet: Head CT- left cerebellar lucency  Laboratory results reviewed by EDP: YES      Stroke/TIA -     Reason for not initiating tPA (alteplase) within 0-3 hrs of onset of symptoms: Onset time greater than or equal to 3 hours                RESULTS        Lab Results:      Results     Procedure Component Value Units Date/Time    UA, Reflex to Microscopic (pts  3 + yrs) [[id number]] Collected:  08/08/16 1511    Specimen:  Urine Updated:  08/08/16 1527    Urine culture [[id number]] Collected:  08/08/16 1513    Specimen:  Urine from Urine, Catheterized, In & Out Updated:  08/08/16 1526    i-Stat Troponin  [[id number]] Collected:  08/08/16 1417     Updated:  08/08/16 1439     i-STAT Troponin 0.01 ng/mL     Blood Culture Aerobic/Anaerobic #1 [[id number]] Collected:  08/08/16 1318    Specimen:  Arm from Blood Updated:  08/08/16 1439    Narrative:       1 BLUE+1 PURPLE    i-Stat CG4 Venous CartrIDge [[id number]] Collected:  08/08/16 1417     Updated:  08/08/16 1422     i-STAT pH Venous 7.551     i-STAT pCO2 Venous 26.4     i-STAT pO2 Venous 19.0     i-STAT HCO3 Bicarbonate Venous 23.2 mEq/L      i-STAT Total CO2 Venous 24.0 mEq/L      i-STAT Base Excess Venous 2.0 mEq/L      i-STAT O2 Saturation Venous 39.0 %      i-STAT Lactic acid 1.5 mmol/L      i-STAT Patient Temperature 98.3     i-STAT FIO2 21     i-STAT O2 Delivery Room Air     i-STAT Allen's Test NA     i-STAT ECMO No     i-STAT Draw Site Venous    Blood Culture Aerobic/Anaerobic #2 [[id number]] Collected:  08/08/16 1406    Specimen:  Arm from Blood Updated:  08/08/16 1406    Narrative:       1 BLUE+1 PURPLE    GFR [[id number]] Collected:  08/08/16 1318     Updated:  08/08/16 1356     EGFR 33.6    Comprehensive metabolic panel [[id number]]  (Abnormal) Collected:  08/08/16 1318    Specimen:  Blood Updated:  08/08/16 1356     Glucose 136 (H) mg/dL      BUN 75.6 (H) mg/dL      Creatinine 1.5 (H) mg/dL      Sodium 433 (L) mEq/L      Potassium 4.8 mEq/L      Chloride 99 (L) mEq/L      CO2 22 mEq/L      Calcium 8.7 mg/dL      Protein, Total 6.4 g/dL      Albumin 3.3 (L) g/dL      AST (SGOT) 13 U/L      ALT  13 U/L      Alkaline Phosphatase 104 U/L      Bilirubin, Total 0.6 mg/dL      Globulin 3.1 g/dL      Albumin/Globulin Ratio 1.1    PT/APTT [[id number]] Collected:  08/08/16 1318     Updated:  08/08/16 1356     PT 13.5 sec      PT INR 1.0     PT Anticoag. Given Within 48 hrs. None     PTT 23 sec     CBC with differential [[id number]]  (Abnormal) Collected:  08/08/16 1318    Specimen:  Blood from Blood Updated:  08/08/16 1345     WBC 11.86 (H) x10 3/uL      Hgb 11.3 (L) g/dL       Hematocrit 65.7 (L) %      Platelets 315 x10 3/uL      RBC 4.12 (L) x10 6/uL      MCV 86.2 fL      MCH 27.4 (L) pg      MCHC 31.8 (L) g/dL      RDW 14 %      MPV 9.6 fL      Neutrophils 78.2 %      Lymphocytes Automated 13.7 %      Monocytes 6.3 %      Eosinophils Automated 0.6 %      Basophils Automated 0.7 %      Immature Granulocyte 0.5 %      Nucleated RBC 0.0 /100 WBC      Neutrophils Absolute 9.28 (H) x10 3/uL      Abs Lymph Automated 1.62 x10 3/uL      Abs Mono Automated 0.75 x10 3/uL      Abs Eos Automated 0.07 x10 3/uL      Absolute Baso Automated 0.08 x10 3/uL      Absolute Immature Granulocyte 0.06 (H) x10 3/uL      Absolute NRBC 0.00 x10 3/uL               Radiology Results:      XR Chest  AP Portable   Final Result    Hypoventilatory change, basilar atelectasis.      Geanie Cooley, MD    08/08/2016 1:32 PM         CT Angiogram Head   Final Result      1.  Possible very subtle perfusion abnormality about the posterior   aspect of left MCA territory. No definite thrombosis or definite   penumbra.      2.  Lucency evident involving the right medial occipital cortex and the   left cerebellar cortex, consistent with ischemia, age-indeterminate but   likely old.      3.  Atherosclerotic stenosis as noted above.             Einar Pheasant, MD    08/08/2016 1:23 PM         CT Angiogram Neck   Final Result      1.  Possible very subtle perfusion abnormality about the posterior   aspect of left MCA territory. No definite thrombosis or definite   penumbra.      2.  Lucency evident involving the right medial occipital cortex and the   left cerebellar cortex, consistent with ischemia, age-indeterminate but   likely old.      3.  Atherosclerotic stenosis as noted above.  Einar Pheasant, MD    08/08/2016 1:23 PM         CT Angiogram Cerebral Perfusion W 3D Reconstruction   Final Result      1.  Possible very subtle perfusion abnormality about the posterior   aspect of left MCA territory. No definite  thrombosis or definite   penumbra.      2.  Lucency evident involving the right medial occipital cortex and the   left cerebellar cortex, consistent with ischemia, age-indeterminate but   likely old.      3.  Atherosclerotic stenosis as noted above.             Einar Pheasant, MD    08/08/2016 1:23 PM         CT Head W Contrast   Final Result      1.  Possible very subtle perfusion abnormality about the posterior   aspect of left MCA territory. No definite thrombosis or definite   penumbra.      2.  Lucency evident involving the right medial occipital cortex and the   left cerebellar cortex, consistent with ischemia, age-indeterminate but   likely old.      3.  Atherosclerotic stenosis as noted above.             Einar Pheasant, MD    08/08/2016 1:23 PM         CT Head WO Contrast   Final Result          Lucency of the left cerebellar cortex in the right medial occipital   cortex, findings consistent with infarction, age indeterminant but   likely old.          Einar Pheasant, MD    08/08/2016 12:49 PM                     Scribe Attestation:      I was acting as a Neurosurgeon for Noralyn Pick MD on [facility] A  Treatment Team: Scribe: Druckenbrod, Molli Hazard     I am the first provider for this patient and I personally performed the services documented. Treatment Team: Scribe: Druckenbrod, Molli Hazard is scribing for me on [facility] A. This note and the patient instructions accurately reflect work and decisions made by me.  Noralyn Pick MD         Noralyn Pick, MD      Judi Saa, MD  08/08/16 [phone]

## 2016-08-09 ENCOUNTER — Observation Stay: Payer: Medicare Other

## 2016-08-09 ENCOUNTER — Inpatient Hospital Stay: Payer: Medicare Other

## 2016-08-09 DIAGNOSIS — D151 Benign neoplasm of heart: Secondary | ICD-10-CM

## 2016-08-09 DIAGNOSIS — N1330 Unspecified hydronephrosis: Secondary | ICD-10-CM

## 2016-08-09 DIAGNOSIS — N179 Acute kidney failure, unspecified: Secondary | ICD-10-CM

## 2016-08-09 DIAGNOSIS — N390 Urinary tract infection, site not specified: Secondary | ICD-10-CM

## 2016-08-09 LAB — COMPREHENSIVE METABOLIC PANEL
ALT: 10 U/L (ref 0–55)
AST (SGOT): 14 U/L (ref 5–34)
Albumin/Globulin Ratio: 1.1 (ref 0.9–2.2)
Albumin: 3.2 g/dL — ABNORMAL LOW (ref 3.5–5.0)
Alkaline Phosphatase: 102 U/L (ref 37–106)
BUN: 23 mg/dL — ABNORMAL HIGH (ref 7.0–19.0)
Bilirubin, Total: 0.7 mg/dL (ref 0.2–1.2)
CO2: 23 mEq/L (ref 22–29)
Calcium: 8.4 mg/dL (ref 7.9–10.2)
Chloride: 105 mEq/L (ref 100–111)
Creatinine: 1.5 mg/dL — ABNORMAL HIGH (ref 0.6–1.0)
Globulin: 3 g/dL (ref 2.0–3.6)
Glucose: 97 mg/dL (ref 70–100)
Potassium: 4.1 mEq/L (ref 3.5–5.1)
Protein, Total: 6.2 g/dL (ref 6.0–8.3)
Sodium: 134 mEq/L — ABNORMAL LOW (ref 136–145)

## 2016-08-09 LAB — CBC AND DIFFERENTIAL
Absolute NRBC: 0 10*3/uL
Basophils Absolute Automated: 0.06 10*3/uL (ref 0.00–0.20)
Basophils Automated: 0.6 %
Eosinophils Absolute Automated: 0.11 10*3/uL (ref 0.00–0.70)
Eosinophils Automated: 1 %
Hematocrit: 35.1 % — ABNORMAL LOW (ref 37.0–47.0)
Hgb: 11.3 g/dL — ABNORMAL LOW (ref 12.0–16.0)
Immature Granulocytes Absolute: 0.06 10*3/uL — ABNORMAL HIGH
Immature Granulocytes: 0.6 %
Lymphocytes Absolute Automated: 2.94 10*3/uL (ref 0.50–4.40)
Lymphocytes Automated: 27.8 %
MCH: 27.6 pg — ABNORMAL LOW (ref 28.0–32.0)
MCHC: 32.2 g/dL (ref 32.0–36.0)
MCV: 85.8 fL (ref 80.0–100.0)
MPV: 9.6 fL (ref 9.4–12.3)
Monocytes Absolute Automated: 1.03 10*3/uL (ref 0.00–1.20)
Monocytes: 9.7 %
Neutrophils Absolute: 6.37 10*3/uL (ref 1.80–8.10)
Neutrophils: 60.3 %
Nucleated RBC: 0 /100 WBC (ref 0.0–1.0)
Platelets: 331 10*3/uL (ref 140–400)
RBC: 4.09 10*6/uL — ABNORMAL LOW (ref 4.20–5.40)
RDW: 14 % (ref 12–15)
WBC: 10.57 10*3/uL (ref 3.50–10.80)

## 2016-08-09 LAB — APTT: PTT: 33 s (ref 23–37)

## 2016-08-09 LAB — ECG 12-LEAD
Atrial Rate: 91 {beats}/min
P-R Interval: 148 ms
Q-T Interval: 372 ms
QRS Duration: 88 ms
QTC Calculation (Bezet): 457 ms
R Axis: 81 degrees
T Axis: 49 degrees
Ventricular Rate: 91 {beats}/min

## 2016-08-09 LAB — HEMOGLOBIN A1C
Average Estimated Glucose: 174.3 mg/dL
Hemoglobin A1C: 7.7 % — ABNORMAL HIGH (ref 4.6–5.9)

## 2016-08-09 LAB — LIPID PANEL
Cholesterol / HDL Ratio: 5.1
Cholesterol: 164 mg/dL (ref 0–199)
HDL: 32 mg/dL — ABNORMAL LOW (ref 40–9999)
LDL Calculated: 105 mg/dL — ABNORMAL HIGH (ref 0–99)
Triglycerides: 136 mg/dL (ref 34–149)
VLDL Calculated: 27 mg/dL (ref 10–40)

## 2016-08-09 LAB — GLUCOSE WHOLE BLOOD - POCT
Whole Blood Glucose POCT: 101 mg/dL — ABNORMAL HIGH (ref 70–100)
Whole Blood Glucose POCT: 321 mg/dL — ABNORMAL HIGH (ref 70–100)
Whole Blood Glucose POCT: 183 mg/dL — ABNORMAL HIGH (ref 70–100)
Whole Blood Glucose POCT: 209 mg/dL — ABNORMAL HIGH (ref 70–100)

## 2016-08-09 LAB — GFR: EGFR: 33.6

## 2016-08-09 LAB — PT/INR
PT INR: 1 (ref 0.9–1.1)
PT: 13.5 s (ref 12.6–15.0)

## 2016-08-09 LAB — HEMOLYSIS INDEX: Hemolysis Index: 4 (ref 0–18)

## 2016-08-09 MED ORDER — LORAZEPAM 2 MG/ML IJ SOLN
1.0000 mg | Freq: Three times a day (TID) | INTRAMUSCULAR | Status: DC | PRN
Start: 2016-08-09 — End: 2016-08-22
  Administered 2016-08-09: 1 mg via INTRAVENOUS
  Filled 2016-08-09: qty 1

## 2016-08-09 MED ORDER — SODIUM CHLORIDE 0.9 % IV SOLN
INTRAVENOUS | Status: DC
Start: 2016-08-09 — End: 2016-08-11

## 2016-08-09 NOTE — UM Notes (Signed)
08/08/16 1549  Place (admit) on Observation Services (ADULT OBSERVATION ADMIT PANEL) Once    Status:    Question Answer Comment   Admitting Physician KUMAR, NISHANT    Diagnosis CVA (cerebral vascular accident)    Estimated Length of Stay < 2 midnights    Tentative Discharge Plan? Home or Self Care    Patient Class Observation            9/16: 80 y/o female found this morning  at about 1030 in her room struggling to put on her shirt, not speaking or responding properly. She was able to move her limbs. Sons present at bedside. At baseline, she ambulates with walker for safety (because of toe amputations). She is fairly independent on her own. Family helps remind her to take her medications. No chest pain, SOB, abdominal pain, N/V/D.    In the ER  - Stroke alert called - neurologist determined she is NOT a stroke candidate   - CT Brain = Possible very subtle perfusion abnormality about the posterior aspect of left MCA territory. No definite thrombosis or definite penumbra.     PMH: arthritis, depression, HLD, HTN, kidney disorder, DM, UTI    PSH: CABG, cystoscopy, foot amputation, left knee replacement    CT head: Lucency of the left cerebellar cortex in the right medial occipital cortex, findings consistent with infarction, age indeterminant but likely old.      CTA head: 1. Possible very subtle perfusion abnormality about the posterior aspect of left MCA territory. No definite thrombosis or definite penumbra.    2. Lucency evident involving the right medial occipital cortex and the left cerebellar cortex, consistent with ischemia, age-indeterminate but likely old.    3. Atherosclerotic stenosis as noted above.    Labs: WBC 11.86, H&H 11.3/35.5, BUN 28, Creat 1.5, Na 130, Cl 99, UA large leuko, UA large blood, HDL 32, LDL 105    VS: T97.3, P108, R20, 158/83, 99%    Plan:  # Motor weakness with Aphasia  - Will admit to observation  - Starting PO ASA 81mg  and Lipitor 40mg   - Place on cardiac monitor  - F/U 2D  echo, MRI, MRA and rest of stroke work up    # UTI  - Starting IV Ceftriaxone in view of AMS and dirty urine  - F/U Urine and Blood cultures    # HTN  - Hold BP meds in view of permissive HTN for first 24 hours  - Continue to monitor BP    # DM2  - Placing on SSI    Status/Rationale:    Anticipate observation stay for < 24hours.  DVT PPx: SQ Heparin  Diet: Cardiac diet (if passes dysphagia screen)    Neurology consult:   Routine brain MRI wo contrast. No need for MRA as she already got a CTA in ED. I canceled the MRA orders.   - Continue telemetry looking for Afib. If not found this hospitalization, would be candidate for long term arrhythmia monitoring i.e. LINQ  - TTE pending  - Continue her home ASA 81mg  daily for now, for secondary stroke prevention. Will consider switch to Plavix.   - Continue statin for secondary stroke prevention  - PT/OT/SLP. Passed for regular diet.     Scheduled     Medication Last Action   aspirin chewable tablet 324 mg Ordered   324 mg, PO, Once       aspirin chewable tablet 81 mg Given   81 mg, PO, Daily  09/17 1018   atorvastatin (LIPITOR) tablet 40 mg Given   40 mg, PO, QHS    09/16 2220   cefTRIAXone (ROCEPHIN) 1 g in sodium chloride 0.9 % 100 mL IVPB mini-bag plus New Bag   1 g, IV, Q24H    09/16 2219   heparin (porcine) injection 5,000 Units Given   5,000 Units, SC, [facility]    09/17 0513   sodium chloride (PF) 0.9 % flush 3 mL Given   3 mL, IV, Q8H    09/17 1018      Continuous     Medication Last Action   0.9% NaCl infusion Ordered   125 mL/hr, IV, Continuous                Gillian Scarce  UR Case Manager, MSN, RN  [employer]  [phone] (voicemail)

## 2016-08-09 NOTE — SLP Eval Note (Signed)
[facility]   Speech and Language Therapy Bedside Swallow Evaluation     Patient: Glenda Green    MRN#: [id number]     Consult received for Glenda Green for SLP Bedside Swallow Evaluation and Treatment.    Plan/Recommendations:   Diet Solid Recommendations: Continue with current diet;regular   Liquid Recommendations:  no liquid consistency restrictions, thin consistency, from a spoon, from a cup, from a straw  Administration of Medications: PO, whole, with liquid  Precautions/Compensations: Awake/alert;Upright 90 degrees for all oral intake;Small bites/sips;Eat/feed slowly;45 degrees upright after meals;Alternate solids and liquids  Aspiration Precautions posted at bedside: no    Follow up treatments: diet tolerance monitoring;strategies training  Recommendation Discussed With: : Patient;Caregiver;Physician;Nurse    SLP Frequency Recommended: 1x per week    Discharge recommendations: Acute Rehab (for aphasia)    Assessment:   Patient presents with mild oropharyngeal dysphagia at bedside. Prolonged but effective mastication of dry solids without residuals. Upon palpation, swallow appears mildly delayed with mildly reduced hyolaryngeal elevation. No overt s/s aspiration. Recommend continue regular solids and thin liquids. SLP will f/u x1 for diet tolerance. See speech/language evaluation for further goals/recommendations.     History of Present Illness:   Glenda Green is a 80 y.o. female admitted on 08/08/2016 with "PMHx of DM2, HTN, HLD Recurrent UTIs (s/p Renal Stent), was found this morning  at about 1030 in her room struggling to put on her shirt, not speaking or responding properly. She was able to move her limbs. Sons present at bedside. At baseline, she ambulates with walker for safety (because of toe amputations). She is fairly independent on her own. Family helps remind her to take her medications. No chest pain, SOB, abdominal pain, N/V/D" Per H&P    Chest XR 08/08/16  IMPRESSION:     Hypoventilatory change, basilar atelectasis.    Head CT 08/08/16  IMPRESSION:   1.  Possible very subtle perfusion abnormality about the posterior aspect of left MCA territory. No definite thrombosis or definite penumbra.  2.  Lucency evident involving the right medial occipital cortex and the left cerebellar cortex, consistent with ischemia, age-indeterminate but likely old.  3.  Atherosclerotic stenosis as noted above.    Medical Diagnosis: Cerebrovascular accident (CVA), unspecified mechanism [I63.9]    Therapy Diagnosis: mild oropharyngeal dysphagia     Past Medical/Surgical History:  Past Medical History:   Diagnosis Date   . Arthritis    . Depression    . Difficulty walking     unsteady gait uses walker/cane/wheelchair   . Hyperlipidemia    . Hypertension    . Kidney disorder    . Type 2 diabetes mellitus, controlled    . Urinary tract infection     multiple time on Nitrofurantion maintaining dose     Past Surgical History:   Procedure Laterality Date   . CORONARY ARTERY BYPASS  2010   . CYSTOSCOPY, URETERAL STENT INSERTION  08/05/2016    Procedure: CYSTOSCOPY,RIGHT/RETROGRADE, RIGHT/ URETEROSCOPY, STENT PLACEMENT.;  Surgeon: Otelia Santee, MD;  Location: [facility] OR;  Service: Urology;;  CYSTOSCOPY,RIGHT/RETROGRADE, RIGHT/ URETEROSCOPY, STENT PLACEMENT.   Marland Kitchen FOOT AMPUTATION  2015    x2 three toes   . JOINT REPLACEMENT  2010    left knee       History/Current Status:  Respiratory Status: room air  Behavior/Mental Status: Awake/alert;Cooperative;Pleasant mood;Confused;Requires prompting  Nutrition: oral  Diet Prior to Study: regular;thin liquids    Subjective:   Patient is agreeable to participation  in the therapy session. Family and/or guardian are agreeable to patient's participation in the therapy session. Nursing clears patient for therapy. Patient's medical condition is appropriate for Speech therapy intervention at this time. RN reports patient has been sleeping, has not eaten breakfast.  Daughter-in-law denies dysphagia at baseline.     PAIN:  denies    Objective:   Observation of Patient/Vital Signs:  Patient is in bed with IV in place.    Patient left with call bell within reach, all needs met, fall mat in place, bed alarm activated and all questions answered. RN notified of session outcome and patient response.     Oral Motor Skills:  Engineer, maintenance (IT) Skills: exceptions to [facility]  Oral Motor Impairments:  (difficult to asses 2/2 not following commands)    Deglutition Skills:  Deglutition Skills  Position: upright 90 degrees  Food(s) Tested: ice chips;thin liquid;puree;solid  Oral Stage: chewing reduced, slow but effective  Pharyngeal Stage: delayed response;reduced laryngeal elevation  Esophageal Stage: appears within functional limits      Goals:  Patient will tolerate diet of regular solids and thin liquids x24-48 hrs without overt s/s aspiration.       Luther Redo, M.S., CF-SLP   Pager [phone]  08/09/2016      Time of Treatment:  SLP Received On: 08/09/16  Start Time: 0855  Stop Time: 0915  Time Calculation (min): 20 min      Functional Limitation Reporting    Primary Functional Limitation:    Swallowing G Code Set  Swallowing, Current Status (U0454): CI  Swallowing, Goal Status (U9811): CI  Swallowing, D/C Status (B1478): CI  Tools used to determine level of impairment:: Clinical judgement       Based on functional assessment and clinical judgement.        Attention MDs:   Thank you for allowing [place] to participate in the care of Glenda Green. Regulations from the Center for Medicare and Medicaid Services (CMS) require your review and approval of this plan of care.     Please cosign this note indicating you are in agreement with the SLP Plan of Care so we may initiate the therapy treatment plan.

## 2016-08-09 NOTE — SLP Eval Note (Cosign Needed)
[facility]   Speech and Language Therapy Evaluation     Patient: Glenda Green    MRN#: [id number]     Consult received for Glenda Green for SLP Evaluation and Treatment.    Assessment:   Clinical Impression: Glenda Green is a 80 y.o. female admitted 08/08/2016 for Cerebrovascular accident (CVA), unspecified mechanism [I63.9] presenting with expressive>receptive language impairments. Difficult to assess cognition 2/2 language impairments. Patient with non-fluent speech with paraphasias and neologisms.  Difficulty answering complex yes/no questions and following simple commands. Deficits noted in automatic speech sequences, repetition, and object naming. Recommend continued skilled SLP services while admitted and upon d/c. SLP will follow.     Therapy Diagnosis: expressive and receptive aphasia     Discharge Recommendations:   Expected disposition: Recommendations: Acute Rehab     Plan:   Plan:     SLP Frequency Recommended: 3x per week       Current Hospitalization:  Referring Physician: Peggye Form, MD  Date of Referral: 08/09/16    Medical Diagnosis: Cerebrovascular accident (CVA), unspecified mechanism [I63.9]    History of Present Illness: MALAN WERK is a 80 y.o. female admitted on 08/08/2016 with "PMHx of DM2, HTN, HLD Recurrent UTIs (s/p Renal Stent), was found this morning  at about 1030 in her room struggling to put on her shirt, not speaking or responding properly. She was able to move her limbs. Sons present at bedside. At baseline, she ambulates with walker for safety (because of toe amputations). She is fairly independent on her own. Family helps remind her to take her medications. No chest pain, SOB, abdominal pain, N/V/D." Per H&P     Head CT 08/08/16  IMPRESSION:   1.  Possible very subtle perfusion abnormality about the posterior  aspect of left MCA territory. No definite thrombosis or definite  penumbra.  2.  Lucency evident involving the right medial occipital  cortex and the  left cerebellar cortex, consistent with ischemia, age-indeterminate but  likely old.  3.  Atherosclerotic stenosis as noted above.    Patient Active Problem List   Diagnosis   . Hydronephrosis of right kidney   . CVA (cerebral vascular accident)      Past Medical/Surgical History:  Past Medical History:   Diagnosis Date   . Arthritis    . Depression    . Difficulty walking     unsteady gait uses walker/cane/wheelchair   . Hyperlipidemia    . Hypertension    . Kidney disorder    . Type 2 diabetes mellitus, controlled    . Urinary tract infection     multiple time on Nitrofurantion maintaining dose     Past Surgical History:   Procedure Laterality Date   . CORONARY ARTERY BYPASS  2010   . CYSTOSCOPY, URETERAL STENT INSERTION  08/05/2016    Procedure: CYSTOSCOPY,RIGHT/RETROGRADE, RIGHT/ URETEROSCOPY, STENT PLACEMENT.;  Surgeon: Otelia Santee, MD;  Location: [facility] OR;  Service: Urology;;  CYSTOSCOPY,RIGHT/RETROGRADE, RIGHT/ URETEROSCOPY, STENT PLACEMENT.   Marland Kitchen FOOT AMPUTATION  2015    x2 three toes   . JOINT REPLACEMENT  2010    left knee       Social History:  Home Living Arrangements  Living Arrangements: Children    Subjective:   Patient is agreeable to participation in the therapy session. Family and/or guardian are agreeable to patient's participation in the therapy session. Nursing clears patient for therapy. Patient's medical condition is appropriate for Speech therapy intervention at this time.  Daughter-in-law reports patient was independent and fully functioning prior to admission.     PAIN:  denies    Objective:   Observation of Patient/Vital Signs:  Patient is in bed with IV in place.    Patient left with call bell within reach, all needs met, fall mat in place, bed alarm activated and all questions answered. RN notified of session outcome and patient response.     Cognitive Status and Neuro Exam:  (Cognition was difficult to assess due to language impairments.)   Patient oriented to  first name, month of birth, and place via yes/no questions.   Able to state daughter-in-law's name with paraphasias, unable to state two sons' names.     Oral Motor Assessment:  Speech is non-fluent.     Auditory Comprehension:  1 step commands: 4/8, inconsistent   Simple yes/no questions: 4/5  Complex yes/no questions: 3/5    Reading Comprehension:  NT    Expression:  verbal    Verbal Expression:  Speech is non-fluent with phonemic paraphasias. Occasional fluent complete sentences.    Automatic speech: numbers 1-10 unable to complete along with clinician with max cues (kept saying "yes" and "okay")  Repetition: 1/3 with max cues ("Pat")  Object naming: 0/3 (with carrier phrase and phonemic cues), able to say use x1 ("feed" for fork), difficulty demonstrating use   Picture description: non-fluent with paraphasias and neologisms, able to identify dangers, required cues to attend to right side of picture    Written Expression:  NT    Pragmatics:  Limited eye contact. No conversational turn taking.     Behavior:  Patient was cooperative and pleasant, became mildly frustrated at times.       Educated the patient to role of speech therapy, plan of care, goals of therapy and recommendations.      Goals:   Patient will be oriented x2 with mod cues.   Patient will answer complex yes/no questions 80% acc with min cues.   Patient will follow 1 step commands 80% acc with min cues.   Patient will complete automatic speech sequences in unison with SLP 80% acc.   Patient will repeat single words 80% acc with mod cues.   Patient will name objects 65% acc with max cues.         Luther Redo, M.S., CF-SLP   Pager [phone]  08/09/2016      Time of treatment:   SLP Received On: 08/09/16  Start Time: 0915  Stop Time: 0940  Time Calculation (min): 25 min        Functional Limitation Reporting    Primary Functional Limitation:    Spoken Language Expressive G Code Set  Spoken Language Expression, Current Status [phone]): CM  Spoken Language  Expression, Goal Status (U9811): CM  Tools used to determine level of impairment:: Clinical judgement        Based on functional assessment and clinical judgement.        Attention MDs:   Thank you for allowing [place] to participate in the care of Glenda Green. Regulations from the [employer] Medicare and Medicaid Services (CMS) require your review and approval of this plan of care.     Please cosign this note indicating you are in agreement with the SLP Plan of Care so we may initiate the therapy treatment plan.

## 2016-08-09 NOTE — Progress Notes (Signed)
[facility] Consult Progress Note    Date Time: 08/09/16   Patient Name: Glenda Green  Outpatient Neurologist: n/Green    CC: difficulty speaking    Assessment:   80 yo F visiting from [place] with h/o DM, HTN, HLD, on ASA 81mg , recurrent UTIs s/p renal stent 08/05/16 (was off ASA for it), found aphasic on AM of 08/08/16. Partial L MCA syndrome on arrival to [facility], with NIHSS 10. Was out of window for IV-tPA. No LVO on CTA.     1. Partial L MCA syndrome c/w acute embolic stroke      Mechanism of stroke is suspected to be cardioembolic, potentially occult Afib      LDL 105, A1c 7.7%    2. Recent renal stent 08/05/16    3. H/o DM, HTN, HLD    Recommendations:   - Routine brain MRI wo contrast. No need for MRA as she already got Green CTA in ED. I canceled the MRA orders.   - Continue telemetry looking for Afib. If not found this hospitalization, would be candidate for long term arrhythmia monitoring i.e. LINQ  - TTE pending  - Continue her home ASA 81mg  daily for now, for secondary stroke prevention. Will consider switch to Plavix.   - Continue statin for secondary stroke prevention  - PT/OT/SLP. Passed for regular diet.     Will continue to follow.  Please call with any questions/concerns. Neurology Spectralink [phone].    Peggye Form, MD  Neurohospitalist, [facility], Spectralink [phone]  Neurology Consults: Days- Spectralink [phone] (8a-4:30p), [phone]    Interval History/Subjective:   Some agitation overnight. Could not tolerate MRI, will try again today.   Daughter at bedside this morning.   R hemineglect and gaze preference seem resolved; remains severely aphasic, with relatively preserved motor.   Pt able to get Green word or two out intermittently, mildly frustrated by expressive difficulties.   Passed SLP for Green regular diet.     Medications:     Current Facility-Administered Medications   Medication Dose Route Frequency   . aspirin  324 mg Oral Once   . aspirin  81 mg Oral Daily   . atorvastatin  40 mg Oral  QHS   . cefTRIAXone  1 g Intravenous Q24H   . heparin (porcine)  5,000 Units Subcutaneous Q8H SCH   . sodium chloride (PF)  3 mL Intravenous Q8H       Review of Systems:   All systems reviewed and are negative, except for those mentioned above.     Physical Exam:   Temp:  [98.3 F (36.8 C)-100.4 F (38 C)] 98.5 F (36.9 C)  Heart Rate:  [64-108] 64  Resp Rate:  [16-20] 18  BP: (145-194)/(65-106) 162/79    Vital Signs: Reviewed.    General: well developed, well nourished elderly woman in no acute distress. Cooperative with exam.  Neck: supple, no bruits  CVS: rrr, no m/r/g  Resp: breathing comfortably on room air  Abd: Soft, nondistended  Extremities: no pedal edema, extremities normal in color    Mental Status:   Alert and attentive.  Says mostly "um", at times can get Green short phrase out.   Got part of her daughter's name out.   Not able to answer orientation questions.   Follows Green couple simple commands, will mimic Green few others.   Speech is sparse but seems just mildly dysarthric.     Cranial nerves:   -CN II: PERRL. Inconsistent blink to threat on  the R.  -CN III, IV, VI: Gaze midline, conjugate. No gaze preference. EOMI on observation.   -CN V: Unable to assess.   -CN VII: Face symmetric, no weakness.   -CN VIII: Hearing intact to conversational speech.  -CN IX, X: Unable to assess  -CN XI: Unable to assess   -CN XII: Tongue protrudes midline.    Motor: Muscle bulk normal. Muscle tone normal. RUE pronator drift. Strength seems to be 5/5 throughout UE and LE.     Sensory: grimaces to noxious stim x4. R hemineglect appears to be resolved.     Reflexes: Toes downgoing bilaterally. DTRs hyporrefleive throughout UE and LE.     Coordination: Unable to assess FTN due to aphasia. No tremors.     Gait: Deferred in the ED    Labs:     Results     Procedure Component Value Units Date/Time    Glucose Whole Blood - POCT [[id number]]  (Abnormal) Collected:  08/09/16 0842     Updated:  08/09/16 0847     POCT - Glucose  Whole blood 101 (H) mg/dL     CULTURE BLOOD AEROBIC AND ANAEROBIC [[id number]] Collected:  08/09/16 0439    Specimen:  Arterial Blood Updated:  08/09/16 0724    Narrative:       1 BLUE+1 PURPLE    Lipid panel (Fasting) [[id number]]  (Abnormal) Collected:  08/09/16 0440    Specimen:  Blood Updated:  08/09/16 0612     Cholesterol 164 mg/dL      Triglycerides 086 mg/dL      HDL 32 (L) mg/dL      LDL Calculated 578 (H) mg/dL      VLDL Cholesterol Cal 27 mg/dL      CHOL/HDL Ratio 5.1    Hemolysis index [[id number]] Collected:  08/09/16 0440     Updated:  08/09/16 0612     Hemolysis Index 4    Hemoglobin A1c [[id number]]  (Abnormal) Collected:  08/09/16 0439    Specimen:  Blood Updated:  08/09/16 0609     Hemoglobin A1C 7.7 (H) %      Average Estimated Glucose 174.3 mg/dL     APTT [[id number]] Collected:  08/09/16 0439     Updated:  08/09/16 0534     PTT 33 sec     Protime-INR [[id number]] Collected:  08/09/16 0439    Specimen:  Blood Updated:  08/09/16 0534     PT 13.5 sec      PT INR 1.0     PT Anticoag. Given Within 48 hrs. None    Comprehensive metabolic panel [[id number]]  (Abnormal) Collected:  08/09/16 0440    Specimen:  Blood Updated:  08/09/16 0530     Glucose 97 mg/dL      BUN 29.5 (H) mg/dL      Creatinine 1.5 (H) mg/dL      Sodium 188 (L) mEq/L      Potassium 4.1 mEq/L      Chloride 105 mEq/L      CO2 23 mEq/L      Calcium 8.4 mg/dL      Protein, Total 6.2 g/dL      Albumin 3.2 (L) g/dL      AST (SGOT) 14 U/L      ALT 10 U/L      Alkaline Phosphatase 102 U/L      Bilirubin, Total 0.7 mg/dL      Globulin 3.0 g/dL      Albumin/Globulin Ratio 1.1  GFR [[id number]] Collected:  08/09/16 0440     Updated:  08/09/16 0530     EGFR 33.6    CBC and differential [[id number]]  (Abnormal) Collected:  08/09/16 0440    Specimen:  Blood from Blood Updated:  08/09/16 0514     WBC 10.57 x10 3/uL      Hgb 11.3 (L) g/dL      Hematocrit 78.2 (L) %      Platelets 331 x10 3/uL      RBC 4.09 (L) x10 6/uL      MCV 85.8 fL      MCH 27.6 (L)  pg      MCHC 32.2 g/dL      RDW 14 %      MPV 9.6 fL      Neutrophils 60.3 %      Lymphocytes Automated 27.8 %      Monocytes 9.7 %      Eosinophils Automated 1.0 %      Basophils Automated 0.6 %      Immature Granulocyte 0.6 %      Nucleated RBC 0.0 /100 WBC      Neutrophils Absolute 6.37 x10 3/uL      Abs Lymph Automated 2.94 x10 3/uL      Abs Mono Automated 1.03 x10 3/uL      Abs Eos Automated 0.11 x10 3/uL      Absolute Baso Automated 0.06 x10 3/uL      Absolute Immature Granulocyte 0.06 (H) x10 3/uL      Absolute NRBC 0.00 x10 3/uL     Blood Culture Aerobic/Anaerobic #2 [[id number]] Collected:  08/08/16 1406    Specimen:  Arm from Blood Updated:  08/08/16 1626    Narrative:       1 BLUE+1 PURPLE    UA, Reflex to Microscopic (pts  3 + yrs) [[id number]]  (Abnormal) Collected:  08/08/16 1511    Specimen:  Urine Updated:  08/08/16 1612     Urine Type Clean Catch     Color, UA Yellow     Clarity, UA Cloudy (Green)     Specific Gravity UA 1.027     Urine pH 7.0     Leukocyte Esterase, UA Large (Green)     Nitrite, UA Negative     Protein, UR 100 (Green)     Glucose, UA 50 (Green)     Ketones UA Negative     Urobilinogen, UA Normal mg/dL      Bilirubin, UA Negative     Blood, UA Large (Green)     RBC, UA TNTC (Green) /hpf      WBC, UA TNTC (Green) /hpf      Squamous Epithelial Cells, Urine 6 - 10 /hpf     Urine culture [[id number]] Collected:  08/08/16 1513    Specimen:  Urine from Urine, Catheterized, In & Out Updated:  08/08/16 1526    i-Stat Troponin [[id number]] Collected:  08/08/16 1417     Updated:  08/08/16 1439     i-STAT Troponin 0.01 ng/mL     Blood Culture Aerobic/Anaerobic #1 [[id number]] Collected:  08/08/16 1318    Specimen:  Arm from Blood Updated:  08/08/16 1439    Narrative:       1 BLUE+1 PURPLE    i-Stat CG4 Venous CartrIDge [[id number]] Collected:  08/08/16 1417     Updated:  08/08/16 1422     i-STAT pH Venous 7.551     i-STAT pCO2 Venous 26.4  i-STAT pO2 Venous 19.0     i-STAT HCO3 Bicarbonate Venous 23.2 mEq/L      i-STAT  Total CO2 Venous 24.0 mEq/L      i-STAT Base Excess Venous 2.0 mEq/L      i-STAT O2 Saturation Venous 39.0 %      i-STAT Lactic acid 1.5 mmol/L      i-STAT Patient Temperature 98.3     i-STAT FIO2 21     i-STAT O2 Delivery Room Air     i-STAT Allen's Test NA     i-STAT ECMO No     i-STAT Draw Site Venous    GFR [[id number]] Collected:  08/08/16 1318     Updated:  08/08/16 1356     EGFR 33.6    Comprehensive metabolic panel [[id number]]  (Abnormal) Collected:  08/08/16 1318    Specimen:  Blood Updated:  08/08/16 1356     Glucose 136 (H) mg/dL      BUN 62.1 (H) mg/dL      Creatinine 1.5 (H) mg/dL      Sodium 308 (L) mEq/L      Potassium 4.8 mEq/L      Chloride 99 (L) mEq/L      CO2 22 mEq/L      Calcium 8.7 mg/dL      Protein, Total 6.4 g/dL      Albumin 3.3 (L) g/dL      AST (SGOT) 13 U/L      ALT 13 U/L      Alkaline Phosphatase 104 U/L      Bilirubin, Total 0.6 mg/dL      Globulin 3.1 g/dL      Albumin/Globulin Ratio 1.1    PT/APTT [[id number]] Collected:  08/08/16 1318     Updated:  08/08/16 1356     PT 13.5 sec      PT INR 1.0     PT Anticoag. Given Within 48 hrs. None     PTT 23 sec     CBC with differential [[id number]]  (Abnormal) Collected:  08/08/16 1318    Specimen:  Blood from Blood Updated:  08/08/16 1345     WBC 11.86 (H) x10 3/uL      Hgb 11.3 (L) g/dL      Hematocrit 40.1 (L) %      Platelets 315 x10 3/uL      RBC 4.12 (L) x10 6/uL      MCV 86.2 fL      MCH 27.4 (L) pg      MCHC 31.8 (L) g/dL      RDW 14 %      MPV 9.6 fL      Neutrophils 78.2 %      Lymphocytes Automated 13.7 %      Monocytes 6.3 %      Eosinophils Automated 0.6 %      Basophils Automated 0.7 %      Immature Granulocyte 0.5 %      Nucleated RBC 0.0 /100 WBC      Neutrophils Absolute 9.28 (H) x10 3/uL      Abs Lymph Automated 1.62 x10 3/uL      Abs Mono Automated 0.75 x10 3/uL      Abs Eos Automated 0.07 x10 3/uL      Absolute Baso Automated 0.08 x10 3/uL      Absolute Immature Granulocyte 0.06 (H) x10 3/uL      Absolute NRBC 0.00 x10 3/uL  Rads:   Ct Angiogram Head    Result Date: 08/08/2016  1.  Possible very subtle perfusion abnormality about the posterior aspect of left MCA territory. No definite thrombosis or definite penumbra. 2.  Lucency evident involving the right medial occipital cortex and the left cerebellar cortex, consistent with ischemia, age-indeterminate but likely old. 3.  Atherosclerotic stenosis as noted above.  Einar Pheasant, MD 08/08/2016 1:23 PM     Ct Head Wo Contrast    Result Date: 08/08/2016      Lucency of the left cerebellar cortex in the right medial occipital cortex, findings consistent with infarction, age indeterminant but likely old.    Einar Pheasant, MD 08/08/2016 12:49 PM     Ct Head W Contrast    Result Date: 08/08/2016  1.  Possible very subtle perfusion abnormality about the posterior aspect of left MCA territory. No definite thrombosis or definite penumbra. 2.  Lucency evident involving the right medial occipital cortex and the left cerebellar cortex, consistent with ischemia, age-indeterminate but likely old. 3.  Atherosclerotic stenosis as noted above.  Einar Pheasant, MD 08/08/2016 1:23 PM     Ct Angiogram Cerebral Perfusion W 3d Reconstruction    Result Date: 08/08/2016  1.  Possible very subtle perfusion abnormality about the posterior aspect of left MCA territory. No definite thrombosis or definite penumbra. 2.  Lucency evident involving the right medial occipital cortex and the left cerebellar cortex, consistent with ischemia, age-indeterminate but likely old. 3.  Atherosclerotic stenosis as noted above.  Einar Pheasant, MD 08/08/2016 1:23 PM     Ct Angiogram Neck    Result Date: 08/08/2016  1.  Possible very subtle perfusion abnormality about the posterior aspect of left MCA territory. No definite thrombosis or definite penumbra. 2.  Lucency evident involving the right medial occipital cortex and the left cerebellar cortex, consistent with ischemia, age-indeterminate but likely old. 3.  Atherosclerotic stenosis  as noted above.  Einar Pheasant, MD 08/08/2016 1:23 PM     Fluoroscopy Retrograde Pyelogram With And Without Kub    Result Date: 08/05/2016   1. Retrograde pyelogram under fluoroscopic guidance. 2. Short segment severe narrowing distal right ureter. 3. Please see operative report for further details.  Jasmine December  D'Heureux, MD 08/05/2016 8:22 PM     Xr Chest  Ap Portable    Result Date: 08/08/2016   Hypoventilatory change, basilar atelectasis. Geanie Cooley, MD 08/08/2016 1:32 PM         Signed by:  Peggye Form    Total visit time = 38 minutes, more than 50% spent on counseling/coordinating of care, including but not limited to:   Disease state - discussion regarding medical condition, prognosis, risks vs benefit of treatment   Medications - indications, side effects   Preventative strategies including lifestyle issues as appropriate

## 2016-08-09 NOTE — Plan of Care (Signed)
AOx1, to self. Expressive and receptive aphasia. Pt says " yeah" to questions asked. MAE, bedrest. Pt turned and repositioned q2h and as needed. IVF running @ 125 ml/hr. US renal and Echo done this shift. Foley placed this shift @ 17:30. Scheduled meds given per MD order. Swallow meds whole w/o any difficulty. Fall precautions in place. Call bell within reach. Will continue to monitor for safety.

## 2016-08-09 NOTE — Plan of Care (Signed)
Problem: Safety  Goal: Patient will be free from injury during hospitalization  Outcome: Progressing      Problem: Day of Admission - Stroke  Goal: Core/Quality measure requirements - Admission  Outcome: Progressing      Comments: Assumed care at about 2000. Received pt in bed alert, awake, oriented to self.   Answers yes and no questions. Follows commands. Unable to perform some task due to cognitive inability.   MRI cancelled due to pt not being able to stay still for procedure. MD notified. Reordered with Ativan.   MRI notified. Questionnaire done.

## 2016-08-09 NOTE — Progress Notes (Signed)
HOSPITALIST PROGRESS NOTE    Date Time: 08/09/16 4:30 PM  Patient Name: Glenda Green  Attending Physician: Gildardo Griffes, DO        Subjective:   Patient seen and examined. She seems more alert today, however does not converse logically. Unable to assess ROS.    RN at bedside - as part of [facility] rounding.    Echo shows: RA Atrial Myxoma. Renal US = Bilateral Hydro.    No family at bedside.      Physical Exam:     Vitals:    08/09/16 1546   BP: (!) 156/92   Pulse: 87   Resp: 16   Temp: 98.5 F (36.9 C)   SpO2: 98%       Intake and Output Summary (Last 24 hours) at Date Time  No intake or output data in the 24 hours ending 08/09/16 1630    General: awake, alert, oriented x 0; no acute distress, but she is actively moving her face, arm, legs  HEENT: perrla, eomi, sclera anicteric  oropharynx clear without lesions, mucous membranes moist  Neck: supple, no lymphadenopathy, no thyromegaly, no JVD, no carotid bruits  Cardiovascular: regular rate and rhythm, no murmurs, rubs or gallops  Lungs: clear to auscultation bilaterally, without wheezing, rhonchi, or rales  Abdomen: soft, non-tender, non-distended; no palpable masses, no hepatosplenomegaly, normoactive bowel sounds, no rebound or guarding  Extremities: no clubbing, cyanosis, or edema  Neuro: cranial nerves grossly intact, moves all extremities - but cannot cooperate at all with neuro exam, RUE pronator drift, R Hemineglect  Skin: no rashes or lesions noted      Medications:     Current Facility-Administered Medications   Medication Dose Route Frequency   . aspirin  81 mg Oral Daily   . atorvastatin  40 mg Oral QHS   . cefTRIAXone  1 g Intravenous Q24H   . heparin (porcine)  5,000 Units Subcutaneous Q8H SCH   . sodium chloride (PF)  3 mL Intravenous Q8H         Labs:     Results     Procedure Component Value Units Date/Time    Urine culture [[id number]] Collected:  08/08/16 1513    Specimen:  Urine from Urine, Catheterized, In & Out Updated:  08/09/16 1611     Narrative:       ORDER#: [id number]                                    ORDERED BY: Elizbeth Squires  SOURCE: Urine, Catheterized, In & Out                COLLECTED:  08/08/16 15:13  ANTIBIOTICS AT COLL.:                                RECEIVED :  08/08/16 15:26  Culture Urine                              FINAL       08/09/16 16:10   +  08/09/16   10,000 - 30,000 CFU/ML Enterococcus species               No further work,             Questionable significance due to low quantity  08/09/16   30,000 - 50,000 CFU/ML Gram negative rod               Lactose fermenting gram negative rod, No further work,             Questionable significance due to low quantity.        Blood Culture Aerobic/Anaerobic #1 [[id number]] Collected:  08/08/16 1318    Specimen:  Arm from Blood Updated:  08/09/16 1521    Narrative:       ORDER#: [id number]                                    ORDERED BY: Elizbeth Squires  SOURCE: Blood arm                                    COLLECTED:  08/08/16 13:18  ANTIBIOTICS AT COLL.:                                RECEIVED :  08/08/16 14:39  Culture Blood Aerobic and Anaerobic        PRELIM      08/09/16 15:21  08/09/16   No Growth after 1 day/s of incubation.      Glucose Whole Blood - POCT [[id number]]  (Abnormal) Collected:  08/09/16 1142     Updated:  08/09/16 1239     POCT - Glucose Whole blood 321 (H) mg/dL     Glucose Whole Blood - POCT [[id number]]  (Abnormal) Collected:  08/09/16 0842     Updated:  08/09/16 0847     POCT - Glucose Whole blood 101 (H) mg/dL     CULTURE BLOOD AEROBIC AND ANAEROBIC [[id number]] Collected:  08/09/16 0439    Specimen:  Arterial Blood Updated:  08/09/16 0724    Narrative:       1 BLUE+1 PURPLE    Lipid panel (Fasting) [[id number]]  (Abnormal) Collected:  08/09/16 0440    Specimen:  Blood Updated:  08/09/16 0612     Cholesterol 164 mg/dL      Triglycerides 536 mg/dL      HDL 32 (L) mg/dL      LDL Calculated 644 (H) mg/dL      VLDL Cholesterol Cal 27 mg/dL      CHOL/HDL Ratio 5.1     Hemolysis index [[id number]] Collected:  08/09/16 0440     Updated:  08/09/16 0612     Hemolysis Index 4    Hemoglobin A1c [[id number]]  (Abnormal) Collected:  08/09/16 0439    Specimen:  Blood Updated:  08/09/16 0609     Hemoglobin A1C 7.7 (H) %      Average Estimated Glucose 174.3 mg/dL     APTT [[id number]] Collected:  08/09/16 0439     Updated:  08/09/16 0534     PTT 33 sec     Protime-INR [[id number]] Collected:  08/09/16 0439    Specimen:  Blood Updated:  08/09/16 0534     PT 13.5 sec      PT INR 1.0     PT Anticoag. Given Within 48 hrs. None    Comprehensive metabolic panel [[id number]]  (Abnormal) Collected:  08/09/16 0440    Specimen:  Blood Updated:  08/09/16 0530     Glucose 97 mg/dL  BUN 23.0 (H) mg/dL      Creatinine 1.5 (H) mg/dL      Sodium 161 (L) mEq/L      Potassium 4.1 mEq/L      Chloride 105 mEq/L      CO2 23 mEq/L      Calcium 8.4 mg/dL      Protein, Total 6.2 g/dL      Albumin 3.2 (L) g/dL      AST (SGOT) 14 U/L      ALT 10 U/L      Alkaline Phosphatase 102 U/L      Bilirubin, Total 0.7 mg/dL      Globulin 3.0 g/dL      Albumin/Globulin Ratio 1.1    GFR [[id number]] Collected:  08/09/16 0440     Updated:  08/09/16 0530     EGFR 33.6    CBC and differential [[id number]]  (Abnormal) Collected:  08/09/16 0440    Specimen:  Blood from Blood Updated:  08/09/16 0514     WBC 10.57 x10 3/uL      Hgb 11.3 (L) g/dL      Hematocrit 95.6 (L) %      Platelets 331 x10 3/uL      RBC 4.09 (L) x10 6/uL      MCV 85.8 fL      MCH 27.6 (L) pg      MCHC 32.2 g/dL      RDW 14 %      MPV 9.6 fL      Neutrophils 60.3 %      Lymphocytes Automated 27.8 %      Monocytes 9.7 %      Eosinophils Automated 1.0 %      Basophils Automated 0.6 %      Immature Granulocyte 0.6 %      Nucleated RBC 0.0 /100 WBC      Neutrophils Absolute 6.37 x10 3/uL      Abs Lymph Automated 2.94 x10 3/uL      Abs Mono Automated 1.03 x10 3/uL      Abs Eos Automated 0.11 x10 3/uL      Absolute Baso Automated 0.06 x10 3/uL      Absolute Immature  Granulocyte 0.06 (H) x10 3/uL      Absolute NRBC 0.00 x10 3/uL             Radiology:     Radiology Results (24 Hour)     Procedure Component Value Units Date/Time    US Renal Kidney [[id number]] Resulted:  08/09/16 1222    Order Status:  Sent Updated:  08/09/16 1257            Assessment:     Patient Active Problem List   Diagnosis   . Hydronephrosis of right kidney   . CVA (cerebral vascular accident)   . Atrial myxoma   . Bilateral hydronephrosis   . UTI (urinary tract infection)   . AKI (acute kidney injury)         Plan:   # Left MCA Syndrome with ACUTE CVA  - Continue PO ASA 81mg  and Lipitor 40mg   - Suspecting cardio embolic CVA - patient has R Atrial Myxoma  - Continue on cardiac monitor  - MRI, MRA still pending    # R Atrial Myxoma  - Noted on Echo  - May need further work up as per cardiology recommendations  - Consulting cardiology     # AKI  - Trial of IVFs for now  -  F/U official Renal US    # Bilateral Hydronephrosis   - Received called from radiology that patient has bilateral hydro - with L stent still function  - Will place Foley  - Will consult Urology    # UTI  - Continue IV Ceftriaxone in view of AMS and dirty urine  - F/U Urine and Blood cultures    # HTN  - Hold BP meds in view of permissive HTN for first 24 hours  - Continue to monitor BP    # DM2  - Placing on SSI      DVT Prophylaxis: SQ Heparin  Diet: Regular - will make NPO in case she needs TEE in the AM  Anticipated discharge disposition and date:  Next week    Signed by: Gildardo Griffes, D.O.   cc:Pcp, Noneorunknown, MD

## 2016-08-10 DIAGNOSIS — D151 Benign neoplasm of heart: Secondary | ICD-10-CM

## 2016-08-10 DIAGNOSIS — I6319 Cerebral infarction due to embolism of other precerebral artery: Secondary | ICD-10-CM

## 2016-08-10 LAB — GLUCOSE WHOLE BLOOD - POCT
Whole Blood Glucose POCT: 147 mg/dL — ABNORMAL HIGH (ref 70–100)
Whole Blood Glucose POCT: 169 mg/dL — ABNORMAL HIGH (ref 70–100)
Whole Blood Glucose POCT: 215 mg/dL — ABNORMAL HIGH (ref 70–100)
Whole Blood Glucose POCT: 242 mg/dL — ABNORMAL HIGH (ref 70–100)

## 2016-08-10 LAB — CBC AND DIFFERENTIAL
Absolute NRBC: 0 10*3/uL
Basophils Absolute Automated: 0.07 10*3/uL (ref 0.00–0.20)
Basophils Automated: 0.9 %
Eosinophils Absolute Automated: 0.34 10*3/uL (ref 0.00–0.70)
Eosinophils Automated: 4.2 %
Hematocrit: 33.8 % — ABNORMAL LOW (ref 37.0–47.0)
Hgb: 10.7 g/dL — ABNORMAL LOW (ref 12.0–16.0)
Immature Granulocytes Absolute: 0.05 10*3/uL
Immature Granulocytes: 0.6 %
Lymphocytes Absolute Automated: 2.29 10*3/uL (ref 0.50–4.40)
Lymphocytes Automated: 28 %
MCH: 27.6 pg — ABNORMAL LOW (ref 28.0–32.0)
MCHC: 31.7 g/dL — ABNORMAL LOW (ref 32.0–36.0)
MCV: 87.1 fL (ref 80.0–100.0)
MPV: 9.7 fL (ref 9.4–12.3)
Monocytes Absolute Automated: 0.81 10*3/uL (ref 0.00–1.20)
Monocytes: 9.9 %
Neutrophils Absolute: 4.62 10*3/uL (ref 1.80–8.10)
Neutrophils: 56.4 %
Nucleated RBC: 0 /100 WBC (ref 0.0–1.0)
Platelets: 281 10*3/uL (ref 140–400)
RBC: 3.88 10*6/uL — ABNORMAL LOW (ref 4.20–5.40)
RDW: 15 % (ref 12–15)
WBC: 8.18 10*3/uL (ref 3.50–10.80)

## 2016-08-10 LAB — PT/INR
PT INR: 1 (ref 0.9–1.1)
PT: 13.5 s (ref 12.6–15.0)

## 2016-08-10 LAB — BASIC METABOLIC PANEL
BUN: 25 mg/dL — ABNORMAL HIGH (ref 7.0–19.0)
CO2: 21 mEq/L — ABNORMAL LOW (ref 22–29)
Calcium: 8.1 mg/dL (ref 7.9–10.2)
Chloride: 111 mEq/L (ref 100–111)
Creatinine: 1.7 mg/dL — ABNORMAL HIGH (ref 0.6–1.0)
Glucose: 138 mg/dL — ABNORMAL HIGH (ref 70–100)
Potassium: 4.1 mEq/L (ref 3.5–5.1)
Sodium: 138 mEq/L (ref 136–145)

## 2016-08-10 LAB — PHOSPHORUS: Phosphorus: 4 mg/dL (ref 2.3–4.7)

## 2016-08-10 LAB — MAGNESIUM: Magnesium: 1.4 mg/dL — ABNORMAL LOW (ref 1.6–2.6)

## 2016-08-10 LAB — GFR: EGFR: 29

## 2016-08-10 MED ORDER — METOPROLOL SUCCINATE ER 25 MG PO TB24
25.0000 mg | ORAL_TABLET | Freq: Every day | ORAL | Status: DC
Start: 2016-08-10 — End: 2016-08-11
  Administered 2016-08-10 – 2016-08-11 (×2): 25 mg via ORAL
  Filled 2016-08-10 (×2): qty 1

## 2016-08-10 MED ORDER — CLOPIDOGREL BISULFATE 75 MG PO TABS
75.0000 mg | ORAL_TABLET | Freq: Every day | ORAL | Status: DC
Start: 2016-08-11 — End: 2016-08-13
  Administered 2016-08-11 – 2016-08-13 (×3): 75 mg via ORAL
  Filled 2016-08-10 (×3): qty 1

## 2016-08-10 NOTE — Plan of Care (Signed)
Problem: Safety  Goal: Patient will be free from injury during hospitalization  Pt A&OX1-2. Mostly aphasic. Able to respond at times with one or two words. Moves all extremities. Pt to have TEE. NPO. Continues on IV fluids. Denies any pain or discomfort. Family at bedside. Will continue to monitor.

## 2016-08-10 NOTE — Op Note (Signed)
Procedure Date: 08/05/2016     Patient Type: A     SURGEON: Baldwin Crown MD  ASSISTANT:       PREOPERATIVE DIAGNOSES:  1.  Right hydroureteronephrosis.  2.  Urinary incontinence.     POSTOPERATIVE DIAGNOSES:  1.  Right hydroureteronephrosis.  2.  Urinary incontinence.     TITLE OF PROCEDURE:  1.  Cystourethroscopy.  2.  Right retrograde ureteropyelogram.  3.  Right ureterorenoscopy.  4.  Right double-J ureteral stent placement.     ANESTHESIA:  General.     INDICATIONS FOR PROCEDURE:  The patient is a 80 year old female with a history of urinary incontinence  and recurrent urinary tract infections.  She did undergo a renal  ultrasound, which revealed bilateral pelvicaliectasis, right greater than  left, as well as bladder debris and a left renal mass.  Subsequently, a CT  scan revealed moderate right hydroureteronephrosis, a right renal cyst,  left angiomyolipoma.  Finally, a renal scan was consistent with obstruction  at the level of the right ureterovesical junction.  She now presents for  cystoscopy, right retrograde, right ureterorenoscopy, and right double-J  ureteral stent placement.  The risks and benefits of the procedures were  discussed with the patient and her family in detail.  Informed consent was  obtained.  A conscious decision was made on the part of the patient to  proceed.     DESCRIPTION OF PROCEDURE:  The patient was brought to the operating room and placed in the supine  fashion on the operating table.  Following the administration of  intravenous antibiotic and the induction of general anesthesia, the patient  was placed in the dorsal lithotomy position, taking care to properly pad  all pressure points.  She was then prepared and draped in the usual sterile  fashion.  A 22-French cystoscope sheath was passed per urethra with  obturator and the bladder was drained.  A urine sample was sent for culture  and sensitivity.  A pancystoscopy revealed a moderately trabeculated  urinary bladder with a  pinpoint right ureteral orifice.  There was some  mild bullous edema surrounding the left ureteral orifice as well.  No  lesions, calculi, or diverticula were noted.  The urethra was within normal  limits.  A 5-French open-ended ureteral catheter was positioned at the  right ureteral orifice, however, could not be advanced secondary to  stenosis.  A Sensor wire was passed through the 5-French open-ended  catheter and attempts were made to pass this up the right ureter; however,  these were unsuccessful secondary to the wire coiling back upon itself.  At  this point, the wire was passed just through the orifice and the 5-French  open-ended catheter advanced just beyond it.  The wire was then removed and  a right retrograde ureteropyelogram was performed.  This revealed a  severely narrowed segment of ureter measuring approximately 1.5 to 2 cm in  length distally.  Proximal to that, there was significant right  hydroureter.  At this point, an angle-tipped Glidewire was primed and then  manipulated up the ureter into the right upper pole calix.  The 5-French  open-ended catheter was then advanced over the wire and into the kidney.   The angle-tipped Glidewire was then switched out to a double floppy tip  Super Stiff wire and the 5-French open-ended catheter was then backed out,  leaving the wire in place.  A dual-lumen catheter was then advanced over  the Super Stiff wire to the  level of the pelvic brim.  A Sensor wire was  then passed up through the dual-lumen and up the ureter alongside the Super  Stiff wire.  The dual-lumen was then withdrawn leaving both wires in place.   At this point, the flexible ureteroscope was passed over the Super Stiff  wire up into the kidney.  The Super Stiff wire was then removed and  ureterorenoscopy was performed.  The Sensor wire was kept in place.  The  pelvicalyceal system, although dilated, was otherwise within normal limits  with no signs of lesions or calculi.  The ureter again  also dilated was  noted to be within normal limits down to the area of narrowing distally.   At this point, a 26-cm, 6-French double-J ureteral stent was opened.  The  Sensor wire was backloaded into the cystoscope, which was reintroduced into  the bladder.  The 6-French, 26-cm double-J ureteral stent was then passed  under visual and fluoroscopic guidance over the wire.  The wire was removed  revealing an excellent coil within the right renal pelvis and within the  bladder.  The bladder was then drained and the cystoscope was withdrawn.   The patient tolerated the procedure quite well.  She was transferred to a  stretcher and then to the recovery room in stable condition.           D:  08/10/2016 15:37 PM by Dr. Tawanna Cooler B. Zuleica Seith, MD [phone])  T:  08/10/2016 17:10 PM by       Everlean Cherry: [id number]) (Doc ID: [id number])

## 2016-08-10 NOTE — PT Eval Note (Signed)
[facility]   Physical Therapy Evaluation   Patient: Glenda Green    MRN#: [id number]   Unit: [facility] TOWER 6 EAST  Bed: F619/F619.01    Discharge Recommendations:   Discharge Recommendation: SNF   DME Recommendation: DME Recommended for Discharge:  (In place)    If SNF recommended discharge disposition is not available, patient will need min assist for min A, use of RW, w/c for distance equipment, and HHPT.        Assessment:   Glenda Green is a 80 y.o. female admitted 08/08/2016.  Pt presents with L MCA syndrome c/w acute embolic CVA. Pt admitted with word finding difficulties and LE weakness. Currently pts aphasia is improving with fluent speech and occasional difficulties remembering names. Pt presents with mild LE weakness, impaired sitting balance with L/posterior lean, L visual field cut and mild R inattention. Pt currently requires min A and constant cues for ambulation with RW. Pt may benefit from continued PT in order to improve upon deficits, decrease risk of falls and improve pts QOL.     Impairments: Assessment: Decreased UE ROM;Decreased UE strength;Decreased LE strength;Decreased safety/judgement during functional mobility;Decreased endurance/activity tolerance;Decreased sensation;Visual deficit;Impaired coordination;Decreased functional mobility;Decreased balance;Gait impairment.     Therapy Diagnosis: Impaired balance    Rehabilitation Potential: Prognosis: Good;With continued PT status post acute discharge    Treatment Activities: Evaluation, gait, pt education, NMR  Educated the patient to role of physical therapy, plan of care, goals of therapy and safety with mobility and ADLs, energy conservation techniques.    Plan:   Treatment/Interventions: Exercise, Gait training, Stair training, Neuromuscular re-education, Functional transfer training, LE strengthening/ROM, Endurance training, Cognitive reorientation, Patient/family training, Bed mobility, Equipment  eval/education     PT Frequency: 3-4x/wk   Risks/Benefits/POC Discussed with Pt/Family: With patient/family        Precautions and Contraindications:   Other Precautions: Falls, L visual field cut, mild aphasia, R inattention    Consult received for Leonel Ramsay for PT Evaluation and Treatment.  Patient's medical condition is appropriate for Physical therapy intervention at this time.    Medical Diagnosis: Cerebrovascular accident (CVA), unspecified mechanism [I63.9]      History of Present Illness:   Glenda Green is a 80 y.o. female admitted on 08/08/2016 visiting  "from [place] with h/o DM, HTN, HLD, on ASA 81mg , recurrent UTIs s/p renal stent 08/05/16 (was off ASA for it), found aphasic on AM of 08/08/16. Partial L MCA syndrome on arrival to [facility], with NIHSS 10. Was out of window for IV-tPA. No LVO on CTA"  -Per MD note    Past Medical/Surgical History:  Past Medical History:   Diagnosis Date   . Arthritis    . Depression    . Difficulty walking     unsteady gait uses walker/cane/wheelchair   . Hyperlipidemia    . Hypertension    . Kidney disorder    . Type 2 diabetes mellitus, controlled    . Urinary tract infection     multiple time on Nitrofurantion maintaining dose     Past Surgical History:   Procedure Laterality Date   . CORONARY ARTERY BYPASS  2010   . CYSTOSCOPY, URETERAL STENT INSERTION  08/05/2016    Procedure: CYSTOSCOPY,RIGHT/RETROGRADE, RIGHT/ URETEROSCOPY, STENT PLACEMENT.;  Surgeon: Otelia Santee, MD;  Location: [facility] OR;  Service: Urology;;  CYSTOSCOPY,RIGHT/RETROGRADE, RIGHT/ URETEROSCOPY, STENT PLACEMENT.   Marland Kitchen FOOT AMPUTATION  2015    x2 three toes   .  JOINT REPLACEMENT  2010    left knee         X-Rays/Tests/Labs:  MRI Brain WO Contrast  Impression:        1. Small, acute cortical infarct involving a single left parietal gyrus  without mass effect or hemorrhage.  2. Generalized cerebral volume loss and advanced sequela of chronic  microvascular change.  3. Chronic left cerebellar  infarct.  4. Limited exam.    US Renal Kidney  Impression:    Bilateral collecting system dilatation right more than left.  Stent present on the right can be followed from the renal pelvis into  the bladder. Bladder trabeculation. Mild increase in renal cortical  echogenicity bilaterally.    Comment: Findings discussed with the patient's attending physician at  the time of dictation.    CT Head W Contrast  Impression:     1. Possible very subtle perfusion abnormality about the posterior  aspect of left MCA territory. No definite thrombosis or definite  penumbra.    2.  Lucency evident involving the right medial occipital cortex and the  left cerebellar cortex, consistent with ischemia, age-indeterminate but  likely old.    3.  Atherosclerotic stenosis as noted above.  CT Head WO Contrast   Impression:        Lucency of the left cerebellar cortex in the right medial occipital  cortex, findings consistent with infarction, age indeterminant but  likely old    Lab Results   Component Value Date/Time    HGB 10.7 (L) 08/10/2016 04:00 AM    HCT 33.8 (L) 08/10/2016 04:00 AM    K 4.1 08/10/2016 04:00 AM    NA 138 08/10/2016 04:00 AM    INR 1.0 08/10/2016 04:00 AM           Social History:   Prior Level of Function:  Prior level of function: Ambulates with assistive device, Independent with ADLs  Assistive Device: Four wheel walker  Baseline Activity Level: Community ambulation  Driving: does not drive  Cooking: Yes  DME Currently at Home: Four wheel walker, Front wheel walker, [employer] (shower chair)    Home Living Arrangements:  Living Arrangements: Children  Type of Home: House  Home Layout: Two level (pt lives in basement of sons house in [place])  Bathroom Shower/Tub: Walk-in Physiological scientist: Midwife: Paediatric nurse  DME Currently at [employer]: Four wheel walker, Front wheel walker, [employer] (shower chair)  Home Living - Notes / Comments: pt lives in basement of sons house in [place] with  kitchenette. Performs stairs to main kitchen. Pt  is staying with daughter/son in [place] for past month and will discharge there. Family is supportive and can assist as needed.    Subjective: "Why am I falling over?"   Patient is agreeable to participation in the therapy session. Nursing clears patient for therapy.     Patient Goal: to be safe with mobility    Pain Assessment  Pain Assessment: No/denies pain    Objective:   Observation of Patient/Vital Signs:  Patient is in bed with telemetry and peripheral IV in place.    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 foot    Cognition/Neuro Status  Arousal/Alertness: Appropriate responses to stimuli  Attention Span: Appears intact  Orientation Level: Oriented X4  Memory: Decreased recall of recent events  Following Commands: Follows one step commands without difficulty  Safety Awareness: moderate verbal instruction  Insights: Decreased awareness of deficits;Educated in safety awareness  Problem Solving: Assistance required to identify errors made;Assistance required to generate solutions;Assistance required to implement solutions;minimal assistance  Behavior: attentive;calm;cooperative  Motor Planning: intact  Coordination: FMC impaired;GMC impaired (decreased RAM speed, impaired finger coordination)  Hand Dominance: left handed    Sensation: decreased to LT on RLE  Vision: L visual field deficit, decreased peripheral vision  Auditory: intact to conversational level    Musculoskeletal Examination:  Gross ROM  Neck/Trunk ROM: within functional limits  Right Upper Extremity ROM:  (impaired from previous shoulder injury)  Left Upper Extremity ROM: within functional limits  Right Lower Extremity ROM: within functional limits  Left Lower Extremity ROM: within functional limits    Gross Strength  Right Lower Extremity Strength: 4+/5 (quads: 4/5)  Left Lower Extremity Strength: 4+/5 (Quads:  4/5)    Tone  Tone: within functional limits    Functional Mobility:  Rolling: Stand by Assist  Supine to Sit: Contact Guard Assist (cues to roll to L side to exit on L side of bed)  Scooting to EOB: Contact Guard Assist (tactile cues/facilitation to scoot R hip fwd)  Sit to Stand: Minimal Assist;Increased Time;Increased Effort;with instruction for hand placement to increase safety (pt requires 4 attempts and Min A to stand)  Stand to Sit: [employer] Assist (eccentric control)         Ambulation:  Ambulation: with front-wheeled walker  Ambulation Distance (Feet): 40 Feet  Pattern: decreased cadence;decreased step length;R foot decreased clearance;L foot decreased clearance (R knee flex, LOB L, hitting objects on L)     Balance:  Sitting - Static:  (Fair-; post/L LOB improves with TC/VC and pelvic facilitatio)  Sitting - Dynamic:  (Fair-)  Standing - Static: Fair  Standing - Dynamic: Fair         Participation and Activity Tolerance:  Participation Effort: good  Endurance: Tolerates 30 min exercise with multiple rests      Patient left with call bell within reach, all needs met, SCDs donned, fall mat in place,  chair alarm on and all questions answered. RN notified of session outcome and patient response.       Goals:   Goals  Goal Formulation: With patient/family  Time for Goal Acheivement: 5 visits  Goals: Select goal  Pt Will Go Supine To Sit: with supervision, to maximize functional mobility and independence  Pt Will Sit Edge of Bed: 3-5 min, with supervision, to maximize functional mobility and independence (without LOB and neutral sitting balance)  Pt Will Ambulate: 51-100 feet, with rolling walker, with stand by assist, to maximize functional mobility and independence  Pt Will Go Up / Down Stairs: 3-5 stairs, with minimal assist, With rail, to maximize functional mobility and independence       Renato Gails, PT,DPT pager [phone] 08/10/2016 3:45 PM       Time of treatment:   PT Received On: 08/10/16  Start  Time: 0830  Stop Time: 0920  Time Calculation (min): 50 min

## 2016-08-10 NOTE — Progress Notes (Signed)
HOSPITALIST PROGRESS NOTE    Date Time: 08/10/16 3:39 PM  Patient Name: Glenda Green  Attending Physician: Gildardo Griffes, DO        Subjective:   Patient seen and examined. She is very alert and interactive this morning. AAO x 3.    RN at bedside - as part of Trio rounding.    Echo shows: RA Atrial Myxoma. Renal US = Bilateral Hydro. Discussed case in detail with Cardiology and Urology.       Family updated by me at bedside.       Physical Exam:     Vitals:    08/10/16 1529   BP: 135/74   Pulse: 88   Resp: 16   Temp: (!) 96.2 F (35.7 C)   SpO2: 97%       Intake and Output Summary (Last 24 hours) at Date Time    Intake/Output Summary (Last 24 hours) at 08/10/16 1539  Last data filed at 08/10/16 1230   Gross per 24 hour   Intake                0 ml   Output             2700 ml   Net            -2700 ml       General: awake, alert, oriented x 0; no acute distress, but she is actively moving her face, arm, legs  HEENT: perrla, eomi, sclera anicteric  oropharynx clear without lesions, mucous membranes moist  Neck: supple, no lymphadenopathy, no thyromegaly, no JVD, no carotid bruits  Cardiovascular: regular rate and rhythm, ++ systolic murmur, rubs or gallops  Lungs: clear to auscultation bilaterally, without wheezing, rhonchi, or rales  Abdomen: soft, non-tender, non-distended; no palpable masses, no hepatosplenomegaly, normoactive bowel sounds, no rebound or guarding  Extremities: no clubbing, cyanosis, or edema  Neuro: cranial nerves grossly intact, moves all extremities - but cannot cooperate at all with neuro exam, RUE pronator drift, R Hemineglect  Skin: no rashes or lesions noted      Medications:     Current Facility-Administered Medications   Medication Dose Route Frequency   . aspirin  81 mg Oral Daily   . atorvastatin  40 mg Oral QHS   . cefTRIAXone  1 g Intravenous Q24H   . heparin (porcine)  5,000 Units Subcutaneous Q8H SCH   . sodium chloride (PF)  3 mL Intravenous Q8H         Labs:     Results      Procedure Component Value Units Date/Time    Blood Culture Aerobic/Anaerobic #1 [[id number]] Collected:  08/08/16 1318    Specimen:  Arm from Blood Updated:  08/10/16 1521    Narrative:       ORDER#: [id number]                                    ORDERED BY: Elizbeth Squires  SOURCE: Blood arm                                    COLLECTED:  08/08/16 13:18  ANTIBIOTICS AT COLL.:                                RECEIVED :  08/08/16 14:39  Culture Blood Aerobic and Anaerobic        PRELIM      08/10/16 15:21  08/09/16   No Growth after 1 day/s of incubation.  08/10/16   No Growth after 2 day/s of incubation.      Glucose Whole Blood - POCT [[id number]]  (Abnormal) Collected:  08/10/16 1131     Updated:  08/10/16 1139     POCT - Glucose Whole blood 169 (H) mg/dL     CULTURE BLOOD AEROBIC AND ANAEROBIC [[id number]] Collected:  08/09/16 0439    Specimen:  Arterial Blood Updated:  08/10/16 0821    Narrative:       ORDER#: [id number]                                    ORDERED BY: Gildardo Griffes  SOURCE: Arterial Blood iv                            COLLECTED:  08/09/16 04:39  ANTIBIOTICS AT COLL.:                                RECEIVED :  08/09/16 07:24  Culture Blood Aerobic and Anaerobic        PRELIM      08/10/16 08:21  08/10/16   No Growth after 1 day/s of incubation.      Glucose Whole Blood - POCT [[id number]]  (Abnormal) Collected:  08/10/16 0728     Updated:  08/10/16 0731     POCT - Glucose Whole blood 147 (H) mg/dL     Basic Metabolic Panel [[id number]]  (Abnormal) Collected:  08/10/16 0400    Specimen:  Blood Updated:  08/10/16 0441     Glucose 138 (H) mg/dL      BUN 17.6 (H) mg/dL      Creatinine 1.7 (H) mg/dL      Calcium 8.1 mg/dL      Sodium 160 mEq/L      Potassium 4.1 mEq/L      Chloride 111 mEq/L      CO2 21 (L) mEq/L     Magnesium [[id number]]  (Abnormal) Collected:  08/10/16 0400    Specimen:  Blood Updated:  08/10/16 0441     Magnesium 1.4 (L) mg/dL     Phosphorus [[id number]] Collected:  08/10/16 0400    Specimen:   Blood Updated:  08/10/16 0441     Phosphorus 4.0 mg/dL     GFR [[id number]] Collected:  08/10/16 0400     Updated:  08/10/16 0441     EGFR 29.0    Prothrombin time/INR [[id number]] Collected:  08/10/16 0400    Specimen:  Blood Updated:  08/10/16 0429     PT 13.5 sec      PT INR 1.0     PT Anticoag. Given Within 48 hrs. None    CBC and differential [[id number]]  (Abnormal) Collected:  08/10/16 0400    Specimen:  Blood from Blood Updated:  08/10/16 0422     WBC 8.18 x10 3/uL      Hgb 10.7 (L) g/dL      Hematocrit 10.2 (L) %      Platelets 281 x10 3/uL      RBC 3.88 (L) x10 6/uL      MCV 87.1 fL  MCH 27.6 (L) pg      MCHC 31.7 (L) g/dL      RDW 15 %      MPV 9.7 fL      Neutrophils 56.4 %      Lymphocytes Automated 28.0 %      Monocytes 9.9 %      Eosinophils Automated 4.2 %      Basophils Automated 0.9 %      Immature Granulocyte 0.6 %      Nucleated RBC 0.0 /100 WBC      Neutrophils Absolute 4.62 x10 3/uL      Abs Lymph Automated 2.29 x10 3/uL      Abs Mono Automated 0.81 x10 3/uL      Abs Eos Automated 0.34 x10 3/uL      Absolute Baso Automated 0.07 x10 3/uL      Absolute Immature Granulocyte 0.05 x10 3/uL      Absolute NRBC 0.00 x10 3/uL     Glucose Whole Blood - POCT [[id number]]  (Abnormal) Collected:  08/09/16 2058     Updated:  08/09/16 2104     POCT - Glucose Whole blood 209 (H) mg/dL     Blood Culture Aerobic/Anaerobic #2 [[id number]] Collected:  08/08/16 1406    Specimen:  Arm from Blood Updated:  08/09/16 1721    Narrative:       ORDER#: [id number]                                    ORDERED BY: Salley Scarlet, KA  SOURCE: Blood arm                                    COLLECTED:  08/08/16 14:06  ANTIBIOTICS AT COLL.:                                RECEIVED :  08/08/16 16:26  Culture Blood Aerobic and Anaerobic        PRELIM      08/09/16 17:21  08/09/16   No Growth after 1 day/s of incubation.      Glucose Whole Blood - POCT [[id number]]  (Abnormal) Collected:  08/09/16 1646     Updated:  08/09/16 1648     POCT -  Glucose Whole blood 183 (H) mg/dL     Urine culture [[id number]] Collected:  08/08/16 1513    Specimen:  Urine from Urine, Catheterized, In & Out Updated:  08/09/16 1611    Narrative:       ORDER#: [id number]                                    ORDERED BY: Elizbeth Squires  SOURCE: Urine, Catheterized, In & Out                COLLECTED:  08/08/16 15:13  ANTIBIOTICS AT COLL.:                                RECEIVED :  08/08/16 15:26  Culture Urine  FINAL       08/09/16 16:10   +  08/09/16   10,000 - 30,000 CFU/ML Enterococcus species               No further work,             Questionable significance due to low quantity    08/09/16   30,000 - 50,000 CFU/ML Gram negative rod               Lactose fermenting gram negative rod, No further work,             Questionable significance due to low quantity.                Radiology:     Radiology Results (24 Hour)     Procedure Component Value Units Date/Time    MRI Brain WO Contrast [[id number]] Collected:  08/09/16 2123    Order Status:  Completed Updated:  08/09/16 2131    Narrative:       CLINICAL HISTORY: 80 year old female patient with altered mental status.  Rule out stroke.    EXAM: On Green 3.0 Tesla magnet, the brain was attempted but truncated due  to patient movement and difficulty cooperating. Only an axial flair and  axial diffusion-weighted sequence were acquired.     No prior brain MR for comparison. Correlation made with unenhanced CT  the head dated 08/08/2016.    FINDINGS:    There is an ovoid focus of restricted diffusion involving Green single left  parietal gyrus measuring 15 x 6 mm in size (see series 401 image 25). No  additional restricted diffusion is evident.    There is generalized parenchymal volume loss with prominence of the  ventricles and cortical sulci. Patchy periventricular, deep and  subcortical FLAIR signal abnormalities are nonspecific but statistically  secondary to advanced chronic microvascular sequela. There is Green  chronic  infarct within the left cerebellar periphery. No extra-axial fluid  collections are seen and the basal cisterns are patent.      Impression:            1. Small, acute cortical infarct involving Green single left parietal gyrus  without mass effect or hemorrhage.  2. Generalized cerebral volume loss and advanced sequela of chronic  microvascular change.  3. Chronic left cerebellar infarct.  4. Limited exam.    Waynard Edwards, MD   08/09/2016 9:26 PM      US Renal Kidney [[id number]] Collected:  08/09/16 1528    Order Status:  Completed Updated:  08/09/16 1742    Narrative:       CLINICAL INFORMATION: Abnormal renal function. History of prior urinary  tract infections. History of Green right renal stent placed prior to this  admission.    TECHNIQUE AND FINDINGS: Urinary tract ultrasound. No comparisons  available.    RIGHT KIDNEY: Right kidney measures 13.8 cm in length. Cortical  thickness within normal limits. Increased cortical echogenicity  consistent with nonspecific medical renal disease. Moderate  pelvocaliectasis with dilatation of the right ureter that can be  followed to the bladder. The stent can be seen proximally within the  right renal pelvis and distally within the bladder. No renal mass,  stone, or perirenal abnormality.    LEFT KIDNEY: 10.1 cm in length. Were difficult to image. Cortical  thickness within the range of normal. Mild increase in cortical  echogenicity. Moderate prominence of extrarenal pelvis with mild  intrarenal collecting system dilatation. Left ureter also  appears  dilated and difficult to the bladder.    BLADDER: Bladder volume 338 cc. Patient could not void at this volume.  Bladder wall moderately trabeculated. No stones seen within it.      Impression:        Bilateral collecting system dilatation right more than left.  Stent present on the right can be followed from the renal pelvis into  the bladder. Bladder trabeculation. Mild increase in renal cortical  echogenicity  bilaterally.    Comment: Findings discussed with the patient's attending physician at  the time of dictation.        Annabell Sabal, MD   08/09/2016 5:37 PM              Assessment:     Patient Active Problem List   Diagnosis   . Hydronephrosis of right kidney   . CVA (cerebral vascular accident)   . Atrial myxoma   . Bilateral hydronephrosis   . UTI (urinary tract infection)   . AKI (acute kidney injury)         Plan:   # Left MCA Syndrome with ACUTE CVA  - Switching from ASA to Plavix as per Neurology recommendations  - Continue Lipitor 40mg   - Suspecting cardio embolic CVA - patient has R Atrial Myxoma -- will get TEE tomorrow  - Continue on cardiac monitor  - Noted MRI/MRA results    # R Atrial Myxoma  - Noted on Echo  - Appreciate Cardiology recommendations  - TEE tomorrow    # AKI  - Renal Function worse today  - Discussed with urologist - who mentions to repeat Renal US tomorrow AM  - Keep Foley in for now    # Bilateral Hydronephrosis   - Continue Foley as per discussion with urology  - F/U official Urology consult    # UTI  - Continue IV Ceftriaxone in view of AMS and dirty urine  - Noted urine cultures    # HTN  - Start low dose Metoprolol in view of higher BP (now outside of permissive HTN window)  - Continue to monitor BP    # DM2  - Placing on SSI      DVT Prophylaxis: SQ Heparin  Diet: Regular - will make NPO for TEE in the AM  Anticipated discharge disposition and date:  1 or 2 days    Signed by: Gildardo Griffes, D.O.   cc:Pcp, Noneorunknown, MD

## 2016-08-10 NOTE — Plan of Care (Signed)
Problem: Every Day - Stroke  Goal: Neurological status is stable or improving   08/10/16 1849   Goal/Interventions addressed this shift   Neurological status is stable or improving Monitor/assess/document neurological assessment (Stroke: every 4 hours);Monitor/assess NIH Stroke Scale;Observe for seizure activity and initiate seizure precautions if indicated;Perform CAM Assessment     Goal: Mobility/Activity is maintained at optimal level for patient  Outcome: Progressing   08/10/16 1849   Goal/Interventions addressed this shift   Mobility/activity is maintained at optimal level for patient Increase mobility as tolerated/progressive mobility;Encourage independent activity per ability;Plan activities to conserve energy, plan rest periods;Consult/collaborate with Physical Therapy and/or Occupational Therapy     Goal: Will be able to express needs and understand communication  Outcome: Progressing   08/10/16 1849   Goal/Interventions addressed this shift   Able to express needs and understand communication  Consult/collaborate with Speech Language Pathology (SLP);Patient/patient care companion demonstrates understanding on disease process, treatment plan, medications and discharge plan;Include patient care companion in decisions related to communication;Consult/collaborate with Case Management/Social Work       Comments: Pt A&Ox3-4, forgetful, moves all extremities with very mild Rt weakness noted (4+/5).  Speech clear, mild word finding difficulties noted.  Follows commands.  Denies pain.  Unable to schedule TEE for today.  Cardio Consult done.  TEE will be done tomorrow - NPO after Midnight.  Family at bedside updated on plan of care.  Safety reinforced: side rails up, fall mat in place and bed alarm on.  Will continue to monitor.

## 2016-08-10 NOTE — Consults (Signed)
[place] [facility] CARDIOLOGY CONSULTATION REPORT  [facility]    Date Time: 08/10/16 2:10 PM  Patient Name: Glenda Green  Requesting Physician: Gildardo Griffes, DO       Reason for Consultation:   CVA, possible myxoma    History:   Glenda Green is Green 80 y.o. female admitted on 08/08/2016.  We have been asked by Gildardo Griffes, DO,  to provide cardiac consultation, regarding RA mass on echo.    33 yu/o female with h/o HTN, HLD, DM and CAD s/p 4VCABG in [place] in 2010. She does not have Green cardiologist locally. She was admitted 08/09/16 with aphasia and confusion, but outside window for lytics. MRI showed an MCA distribution acute CVA. She is recovering and her speech is now nearly normal.    An echo 08/09/16 showed normal LV function, moderate AS, Grade 1 DD, and Green possible RA mass. I reviewed the images and it is difficult to characterize the RA echogenic area, though it could certainly represent Green myxoma.    Past Medical History:     Past Medical History:   Diagnosis Date   . Arthritis    . Depression    . Difficulty walking     unsteady gait uses walker/cane/wheelchair   . Hyperlipidemia    . Hypertension    . Kidney disorder    . Type 2 diabetes mellitus, controlled    . Urinary tract infection     multiple time on Nitrofurantion maintaining dose       Past Surgical History:     Past Surgical History:   Procedure Laterality Date   . CORONARY ARTERY BYPASS  2010   . CYSTOSCOPY, URETERAL STENT INSERTION  08/05/2016    Procedure: CYSTOSCOPY,RIGHT/RETROGRADE, RIGHT/ URETEROSCOPY, STENT PLACEMENT.;  Surgeon: Otelia Santee, MD;  Location: [facility] OR;  Service: Urology;;  CYSTOSCOPY,RIGHT/RETROGRADE, RIGHT/ URETEROSCOPY, STENT PLACEMENT.   Marland Kitchen FOOT AMPUTATION  2015    x2 three toes   . JOINT REPLACEMENT  2010    left knee       Family History:   History reviewed. No pertinent family history.    Social History:     Social History     Social History   . Marital status: Widowed     Spouse name:  N/Green   . Number of children: N/Green   . Years of education: N/Green     Social History Main Topics   . Smoking status: Never Smoker   . Smokeless tobacco: Never Used   . Alcohol use No   . Drug use: Unknown   . Sexual activity: Not on file     Other Topics Concern   . Not on file     Social History Narrative   . No narrative on file       Allergies:     Allergies   Allergen Reactions   . Tape Itching       Medications:     Prescriptions Prior to Admission   Medication Sig   . aspirin EC 81 MG EC tablet Take 81 mg by mouth daily.   Marland Kitchen atorvastatin (LIPITOR) 40 MG tablet Take 40 mg by mouth daily.   . DULoxetine (CYMBALTA) 60 MG capsule Take 60 mg by mouth daily.   . ferrous sulfate 325 (65 FE) MG tablet Take 325 mg by mouth every morning with breakfast.   . Insulin Glargine (LANTUS SC) Inject 10 Unit into the skin nightly.Took 5 units 08/04/16       .  liraglutide (VICTOZA) 18 MG/3ML Solution Pen-injector Inject 1.8 each into the skin daily.       . metoprolol XL (TOPROL-XL) 25 MG 24 hr tablet Take 25 mg by mouth daily.   . nitrofurantoin (MACRODANTIN) 50 MG capsule Take 2 capsules (100 mg total) by mouth every 12 (twelve) hours.   . Probiotic Product (PROBIOTIC ADVANCED PO) Take by mouth.        Current Facility-Administered Medications   Medication Dose Route Frequency   . aspirin  81 mg Oral Daily   . atorvastatin  40 mg Oral QHS   . cefTRIAXone  1 g Intravenous Q24H   . heparin (porcine)  5,000 Units Subcutaneous Q8H SCH   . sodium chloride (PF)  3 mL Intravenous Q8H     . sodium chloride 125 mL/hr at 08/10/16 0436       Review of Systems:    Comprehensive review of systems including constitutional, eyes, ears, nose, mouth, throat, cardiovascular, GI, GU, musculoskeletal, integumentary, respiratory, neurologic, psychiatric, and endocrine is negative other than what is mentioned already in the history of present illness    Physical Exam:     VITAL SIGNS PHYSICAL EXAM   Vitals:    08/10/16 1133   BP: 175/85   Pulse: 73      Resp: 18   Temp: (!) 96.5 F (35.8 C)   SpO2: 98%     Temp (24hrs), Avg:97.8 F (36.6 C), Min:96.5 F (35.8 C), Max:98.5 F (36.9 C)      Intake and Output Summary (Last 24 hours) at Date Time    Intake/Output Summary (Last 24 hours) at 08/10/16 1410  Last data filed at 08/10/16 1230   Gross per 24 hour   Intake                0 ml   Output             2700 ml   Net            -2700 ml       Telemetry: no changes Physical Exam  General: awake, alert, breathing comfortably, no acute distress  Head: normocephalic  Eyes: EOM's intact  Cardiovascular: regular rate and rhythm, normal S1, S2, no S3, no S4, 2/6 SEM base--neck  Neck: bilar bruits versis transmitted murmur  Lungs: clear to auscultation bilateraly, without wheezing, rhonchi, or rales  Abdomen: soft, non-tender, non-distended; no palpable masses,  normoactive bowel sounds  Extremities: no edema  Pulse: equal pulses, 4/4 symmetric  Neurological: Alert and oriented X3, mood and affect normal  Musculoskeletal: normal strength and tone     Labs Reviewed:             Recent Labs  Lab 08/09/16  0440   Cholesterol 164   Triglycerides 136   HDL 32*   LDL Calculated 105*       Recent Labs  Lab 08/09/16  0440   Bilirubin, Total 0.7   Protein, Total 6.2   Albumin 3.2*   ALT 10   AST (SGOT) 14       Recent Labs  Lab 08/10/16  0400   Magnesium 1.4*       Recent Labs  Lab 08/10/16  0400 08/09/16  0439   PT 13.5 13.5   PT INR 1.0 1.0   PTT  --  33       Recent Labs  Lab 08/10/16  0400 08/09/16  0440 08/08/16  1318   WBC 8.18 10.57 11.86*  Hgb 10.7* 11.3* 11.3*   Hematocrit 33.8* 35.1* 35.5*   Platelets 281 331 315       Recent Labs  Lab 08/10/16  0400 08/09/16  0440 08/08/16  1318   Sodium 138 134* 130*   Potassium 4.1 4.1 4.8   Chloride 111 105 99*   CO2 21* 23 22   BUN 25.0* 23.0* 28.0*   Creatinine 1.7* 1.5* 1.5*   EGFR 29.0 33.6 33.6   Glucose 138* 97 136*   Calcium 8.1 8.4 8.7       DIAGNOSTIC    EKG:  SR 91 bpm, PACs, delayed RWP      Echo 08/09/16:  -  Left  ventricle:  -  Size was normal.  -  Systolic function was normal. Ejection fraction was estimated to be 65 %.  -  There were no regional wall motion abnormalities.  -  Wall thickness was mildly to moderately increased.  -  There was an increased relative contribution of atrial contraction to  ventricular filling. -  Doppler parameters were consistent with abnormal left  ventricular relaxation (grade 1 diastolic dysfunction).  -  Right atrium:  -  Size was normal.  -  There was Green medium-sized, spherical, echogenic, fixed mass on the right side  of the interatrial septum. It may represent Green myxoma.  -Moderate AS    Assessment:    Admitted 08/08/16 with aphasia, outside window for lytics, with MRI showing an acute left MCA distribution CVA. Symptoms improving.   RA mass by TTE. Limited image quality. May represent Green myxoma. Of note, no shunt seen on same echo, so relationship to her presentation is uncertain.   Normal LV function   Moderate AS by TTE 08/08/16   CAD s/p 4V-CABG in 2010 ([place], [place])   HTN   HLD   DM   OA    Recommendations:    Permissive HTN as direct by Neuro service   NPO p MN   TEE tomorrow to assess RA mass and evaluate for any other source of embolism   Thanks for consult      Signed by: Lelan Pons, MD    [facility]  NP Spectralink [phone] (8am-5pm)  MD Spectralink [phone] or [phone] (8am-5pm)  Arrhythmia Spectralink [phone] (8am-4:30pm)  After hours, non urgent consult line [phone]  After Hours, urgent consults [phone]

## 2016-08-10 NOTE — UM Notes (Signed)
CSR for 08/09/16    Status changed to inpt  Date and time of inpt order: 08/09/16 1459    Renal US, IVF, SLP eval, foley, labs    Remains on IV abx    Remains on IVF @ 125    Ativan IV x 1 for MRI    BUN: 23  Cr: 1.5  Na: 134    MRI brain: Small, acute cortical infarct involving a single left parietal gyrus  without mass effect or hemorrhage.    Renal US: Bilateral collecting system dilatation right more than left.  Stent present on the right can be followed from the renal pelvis into  the bladder. Bladder trabeculation. Mild increase in renal cortical  echogenicity bilaterally.    Neurology note: Partial L MCA syndrome c/w acute embolic stroke  Mechanism of stroke is suspected to be cardioembolic, potentially occult Afib  Recommendations:   - Routine brain MRI wo contrast. No need for MRA as she already got a CTA in ED. I canceled the MRA orders.   - Continue telemetry looking for Afib. If not found this hospitalization, would be candidate for long term arrhythmia monitoring i.e. LINQ  - TTE pending  - Continue her home ASA 81mg  daily for now, for secondary stroke prevention. Will consider switch to Plavix.   - Continue statin for secondary stroke prevention  - PT/OT/SLP. Passed for regular diet.    Medicine note: Left MCA Syndrome with ACUTE CVA  - Continue PO ASA 81mg  and Lipitor 40mg   - Suspecting cardio embolic CVA - patient has R Atrial Myxoma  - Continue on cardiac monitor   UTI  - Continue IV Ceftriaxone in view of AMS and dirty urine  - F/U Urine and Blood cultures    Leafy Half RN BSN  Utilization Review Case Manager  [employer]  [phone]

## 2016-08-10 NOTE — OT Eval Note (Signed)
[facility]   Occupational Therapy Evaluation     Patient: Glenda Green    MRN#: [id number]   Unit: [facility] TOWER 6 EAST  Bed: F619/F619.01                                     Discharge Recommendations:   Discharge Recommendation: SNF   DME Recommended for Discharge: BSC, Other (Comment) (has shower chair,Rollator)    If SNF recommended discharge disposition is not available, patient will need mod/max a hands on assist for ADL functional ,mobility ,balance ,cog.skills,activity tolerance with above,  equipment, and Home OT.       Assessment:   CHERA Green is a 80 y.o. female admitted 08/08/2016 with speech difficulty ,pt with Hx recurrent UTIs ,now s/p renal stent placement ,pt is left handed presents with decreased ADL functional mobility ,right UE weakness (has base line shoulder weakness,frozen shoulder) decreased balance ,activity tolerance ,pt will benefit from OT intervention.    Therapy Diagnosis: weakness,decreased balance     Rehabilitation Potential: good for set goals    Treatment Activities: initial eval. Educated pt and family for safety during ADL functional mobility ,ROM exs demonstrated learning.  Educated the patient to role of occupational therapy, plan of care, goals of therapy and safety with mobility and ADLs.    Plan:   OT Frequency Recommended: 2-3x/wk     Treatment/Interventions: ADL training,Functional transfer training,Therapeutic exercises,Therapeutic activities,Neuromuscular reeducation techniques.    Risks/benefits/POC discussed yes       Precautions and Contraindications:   Fall,up as tolerated    Consult received for Leonel Ramsay for OT Evaluation and Treatment.  Patient's medical condition is appropriate for Occupational Therapy intervention at this time.      History of Present Illness:    NYASIAH Green is a 80 y.o. female admitted on 08/08/2016 with This is a 80yo (from [place], lives with family) with PMHx of DM2, HTN, HLD Recurrent  UTIs (s/p Renal Stent), was found this morning  at about 1030 in her room struggling to put on her shirt, not speaking or responding properly. She was able to move her limbs. Sons present at bedside. At baseline, she ambulates with walker for safety (because of toe amputations). She is fairly independent on her own. Family helps remind her to take her medications. No chest pain, SOB, abdominal pain, N/V/D.    In the ER  - Stroke alert called - neurologist determined she is NOT a stroke candidate   - CT Brain = Possible very subtle perfusion abnormality about the posterior aspect of left MCA territory    Admitting Diagnosis: Cerebrovascular accident (CVA), unspecified mechanism [I63.9]    Past Medical/Surgical History:  Past Medical History:   Diagnosis Date   . Arthritis    . Depression    . Difficulty walking     unsteady gait uses walker/cane/wheelchair   . Hyperlipidemia    . Hypertension    . Kidney disorder    . Type 2 diabetes mellitus, controlled    . Urinary tract infection     multiple time on Nitrofurantion maintaining dose     Past Surgical History:   Procedure Laterality Date   . CORONARY ARTERY BYPASS  2010   . CYSTOSCOPY, URETERAL STENT INSERTION  08/05/2016    Procedure: CYSTOSCOPY,RIGHT/RETROGRADE, RIGHT/ URETEROSCOPY, STENT PLACEMENT.;  Surgeon: Otelia Santee, MD;  Location: [facility]  OR;  Service: Urology;;  CYSTOSCOPY,RIGHT/RETROGRADE, RIGHT/ URETEROSCOPY, STENT PLACEMENT.   Marland Kitchen FOOT AMPUTATION  2015    x2 three toes   . JOINT REPLACEMENT  2010    left knee         Imaging/Tests/Labs:  Ct Angiogram Head    Result Date: 08/08/2016  1.  Possible very subtle perfusion abnormality about the posterior aspect of left MCA territory. No definite thrombosis or definite penumbra. 2.  Lucency evident involving the right medial occipital cortex and the left cerebellar cortex, consistent with ischemia, age-indeterminate but likely old. 3.  Atherosclerotic stenosis as noted above.  Einar Pheasant, MD  08/08/2016 1:23 PM     Ct Head Wo Contrast    Result Date: 08/08/2016      Lucency of the left cerebellar cortex in the right medial occipital cortex, findings consistent with infarction, age indeterminant but likely old.    Einar Pheasant, MD 08/08/2016 12:49 PM     Ct Head W Contrast    Result Date: 08/08/2016  1.  Possible very subtle perfusion abnormality about the posterior aspect of left MCA territory. No definite thrombosis or definite penumbra. 2.  Lucency evident involving the right medial occipital cortex and the left cerebellar cortex, consistent with ischemia, age-indeterminate but likely old. 3.  Atherosclerotic stenosis as noted above.  Einar Pheasant, MD 08/08/2016 1:23 PM     Ct Angiogram Cerebral Perfusion W 3d Reconstruction    Result Date: 08/08/2016  1.  Possible very subtle perfusion abnormality about the posterior aspect of left MCA territory. No definite thrombosis or definite penumbra. 2.  Lucency evident involving the right medial occipital cortex and the left cerebellar cortex, consistent with ischemia, age-indeterminate but likely old. 3.  Atherosclerotic stenosis as noted above.  Einar Pheasant, MD 08/08/2016 1:23 PM     Ct Angiogram Neck    Result Date: 08/08/2016  1.  Possible very subtle perfusion abnormality about the posterior aspect of left MCA territory. No definite thrombosis or definite penumbra. 2.  Lucency evident involving the right medial occipital cortex and the left cerebellar cortex, consistent with ischemia, age-indeterminate but likely old. 3.  Atherosclerotic stenosis as noted above.  Einar Pheasant, MD 08/08/2016 1:23 PM     Mri Brain Wo Contrast    Result Date: 08/09/2016   1. Small, acute cortical infarct involving a single left parietal gyrus without mass effect or hemorrhage. 2. Generalized cerebral volume loss and advanced sequela of chronic microvascular change. 3. Chronic left cerebellar infarct. 4. Limited exam. Waynard Edwards, MD 08/09/2016 9:26 PM     Fluoroscopy Retrograde  Pyelogram With And Without Kub    Result Date: 08/05/2016   1. Retrograde pyelogram under fluoroscopic guidance. 2. Short segment severe narrowing distal right ureter. 3. Please see operative report for further details.  Jasmine December  D'Heureux, MD 08/05/2016 8:22 PM     US Renal Kidney    Result Date: 08/09/2016   Bilateral collecting system dilatation right more than left. Stent present on the right can be followed from the renal pelvis into the bladder. Bladder trabeculation. Mild increase in renal cortical echogenicity bilaterally. Comment: Findings discussed with the patient's attending physician at the time of dictation. Annabell Sabal, MD 08/09/2016 5:37 PM     Xr Chest  Ap Portable    Result Date: 08/08/2016   Hypoventilatory change, basilar atelectasis. Geanie Cooley, MD 08/08/2016 1:32 PM  Social History:   Prior Level of Function: independent  Ambulates with Mudlogger Devices: Rollator  Baseline Activity: house hold ambulation  DME Currently at Home: Rollator  Home Living Arrangements: lives with son in basement apartment  Type of Home: house  Home Layout: steps to go to basement    Subjective:I want to eat    Patient is agreeable to participation in the therapy session. Family and/or guardian are agreeable to patient's participation in the therapy session. Nursing clears patient for therapy.     Patient Goal: eat  Pain:   Scale: 0/10  Location:   Intervention:     Objective:   Patient is seated in a cardiac chair with telemetry and peripheral IV in place.family at bed side      Cognitive Status and Neuro Exam:  Alert and oriented followed simple directions ,some word finding difficulty noted. Some difficulty with short term memory    Musculoskeletal Examination  RUE ROM: PROM shoulder flex/abd 110 ,crepitus noted.  LUE ROM: WFL  RLE ROM: grossly WFL  LLE ROM: grossly WFL (Knee flex limited,has Hx knee replacement,partial toes amputation)    RUE Strength: shoulder 3-/5 distally 4/5  LUE  Strength: 4+ /5  RLE Strength: grossly WFl  LLE Strength: WFL  Tone near normal     Sensory/Oculomotor Examination  Auditory: able to hear therapist  Tactile: intact left side  Vision: tracks in all directions,denies vision changes uses glasses for distance does not have them with her,able to read clock,therapist ID, needs further assessment.      Activities of Daily Living  Eating: hand to mouth I with left hand (pt is left handed)  Grooming: supervision  Bathing: UB minA LB mod/max a  UE Dressing: gown CG  LE Dressing: maxA  Toileting: foley    Functional Mobility:  Supine to Sit: NT  Sit to Stand: NT  Transfers: min/mod A ,difficulty weight shifting on to feet,stands with weight bearing on heels     Balance  Static Sitting: good-  Dynamic Sitting: Nt  Static Standing: Nt  Dynamic Standing: NT    Participation and Activity Tolerance  Participation Effort: good  Endurance: good    Patient left with call bell within reach, all needs met, SCDs not in use RN aware, fall mat in place, bed alarm NA, chair alarm on and all questions answered. RN notified of session outcome and patient response.       Goals:  Time For Goal Achievement: 5 visits  ADL Goals  Patient will dress upper body: Independent, 5 visits, without AE  Patient will dress lower body: Independent, 5 visits, without AE  Pt will complete bathing: Modified Independent, 5 visits  Mobility and Transfer Goals  Pt will perform functional transfers: Modified Independent, 5 visits, with rolling walker  Neuro Re-Ed Goals  Pt will perform dynamic sitting balance: Independent, 5 visits, to increase ability to complete ADLs  Pt will perform dynamic standing balance: Modified Independent, 5 visits, to complete standing ADLs safely, for 10 minutes  Musculoskeletal Goals  Pt will demonstrate increase of strength: to 4+/5, to increase ability to complete ADLs, 5 visits  Other Goal: increase activity tolerance to good  Executive Fucntion Goals  Pt will demonstrate safety:  independent, 5 visits, to increase ability to complete ADLs, instituting safety techniques                Ander Purpura, MA/OTR, CSRS, LSVT-BIG  [phone]    Time of treatment:   OT  Received On: 08/10/16  Start Time: 1130  Stop Time: 1215  Time Calculation (min): 45 min

## 2016-08-10 NOTE — Progress Notes (Signed)
[facility] Neurology Consult Progress Note    Date Time: 08/10/16   Patient Name: [name]  Outpatient Neurologist: n/a    CC: difficulty speaking    Assessment:   80 yo F visiting from [place] with h/o DM, HTN, HLD, on ASA 81mg , recurrent UTIs s/p renal stent 08/05/16 (was off ASA for it), found aphasic on AM of 08/08/16. Partial L MCA syndrome on arrival to [facility], with NIHSS 10. Was out of window for IV-tPA. No LVO on CTA.     1. Partial L MCA syndrome c/w acute embolic stroke      Mechanism of stroke is suspected to be cardioembolic, potentially occult Afib      LDL 105, A1c 7.7%    2. Recent renal stent 08/05/16    3. H/o DM, HTN, HLD    Recommendations:   - Recommend switching home ASA 81mg  daily to Plavix, for secondary stroke prevention.   - Continue statin for secondary stroke prevention  - Continue telemetry looking for Afib. If not found this hospitalization, would be candidate for long term arrhythmia monitoring i.e. LINQ  - Follow up TEE  - Continue medical management by primary team  - Supportive therapies:  PT/OT/SLP. Passed for regular diet.   - DVT ppx    Neurology Attending Addendum:    I have personally reviewed the interval history, images and pertinent test results and I have personally examined the patient and confirmed the major physical findings of the preceding NP/PA note.  Also, I have noted any changes since the note was written as well as added additional findings and recommendations.    Glenda Green long with family present was seen for neurological follow-up.  She remains neurologically stable at this time.  .  There is concern that she may have a right atrial mass and she is scheduled to have a TEE for further confirmation.  This will help to help an embolic source and then lead to further management and prognosis.  .  [facility] neurology will continue to follow make appropriate recommendations.    Sinclair Ship, MD  [facility] Neurology  Neuro-hospitalist  [facility]  [employer]  [phone]      Interval History/Subjective:   No acute neurological events reported or documented overnight.  Patient is awake, alert, answering questions and following commands appropriately.  She was sitting up in chair at bedside after working with physical therapy.  Daughter-in-law at bedside.  Patient's speech seems fluent and intact.  No word finding difficulties noted.  Patient notes that she has recently moved from [place] to [place].  She is now living with her youngest son in [place].  Patient continues to have mild left lower extremity weakness.  She denies headache, dizziness, nausea and vomiting, paresthesias and visual disturbance.    MRI Brain WO Contrast  08/09/16  IMPRESSION:   1. Small, acute cortical infarct involving a single left parietal gyrus without mass effect or hemorrhage.  2. Generalized cerebral volume loss and advanced sequela of chronic microvascular change.  3. Chronic left cerebellar infarct.  4. Limited exam.    Transthoracic Echocardiogram 08/09/16  SUMMARY:   -  Left ventricle:  Size was normal.  Systolic function was normal. Ejection fraction was estimated to be 65 %.  There were no regional wall motion abnormalities.  Wall thickness was mildly to moderately increased.  There was an increased relative contribution of atrial contraction to ventricular filling.  Doppler parameters were consistent with abnormal left ventricular  relaxation (grade 1 diastolic dysfunction).   -  Right atrium:  Size was normal.  There was a medium-sized, spherical, echogenic, fixed mass on the right side  of the interatrial septum. 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      Medications:     Current Facility-Administered Medications   Medication Dose Route Frequency   . aspirin  81 mg Oral Daily   . atorvastatin  40 mg Oral QHS   . cefTRIAXone  1 g Intravenous Q24H   . heparin (porcine)  5,000 Units Subcutaneous Q8H SCH   . sodium chloride (PF)  3 mL Intravenous Q8H       Review of Systems:   All  systems reviewed and are negative, except for those mentioned above.     Physical Exam:   Temp:  [96.5 F (35.8 C)-98.5 F (36.9 C)] 96.5 F (35.8 C)  Heart Rate:  [73-105] 73  Resp Rate:  [16-18] 18  BP: (130-197)/(72-92) 175/85    Vital Signs: Reviewed.    General: well developed, well nourished elderly woman in no acute distress. Cooperative with exam.  Follows commands appropriately  Extremities: no pedal edema, extremities normal in color    Mental Status: Patient is awake, alert and oriented to person, place, and time.  She knows pertinent details about family members at bedside.   Affect is normal  Fund of knowledge is intact  Recent and remote memory appear intact  Language function is normal.  There is no evidence of aphasia and conversational speech    Cranial nerves:   -CN II: Visual fields testing shows some left-sided visual loss  -CN III, IV, VI: Gaze midline, conjugate. No gaze preference. EOMI on observation.   -CN V: Facial sensation intact   -CN VII: Left facial droop  -CN VIII: Hearing intact to conversational speech.  -CN IX, X: Normal phonation  -CN XI: Symmetric strength of sternocleidomastoid and trapezius muscles  -CN XII: Tongue protrudes midline.    Motor: Muscle bulk normal. Muscle tone normal. RUE pronator drift. Strength seems to be 5/5 throughout UE and LE With the exception of left lower extremity weakness at 4/5.     Sensory: Light touch intact  Temperature intact  Vibration intact    Reflexes: Toes downgoing on right.  Patient's left great toe is absent. DTRs hyporreflexive throughout UE and LE.     Coordination: FTN intact. No tremors.     Gait: Deferred Due to concern for patient safety due to patient condition    Labs:     Results     Procedure Component Value Units Date/Time    Glucose Whole Blood - POCT [[id number]]  (Abnormal) Collected:  08/10/16 1131     Updated:  08/10/16 1139     POCT - Glucose Whole blood 169 (H) mg/dL     CULTURE BLOOD AEROBIC AND ANAEROBIC  [[id number]] Collected:  08/09/16 0439    Specimen:  Arterial Blood Updated:  08/10/16 0821    Narrative:       ORDER#: [id number]                                    ORDERED BY: Gildardo Griffes  SOURCE: Arterial Blood iv                            COLLECTED:  08/09/16 04:39  ANTIBIOTICS AT COLL.:  RECEIVED :  08/09/16 07:24  Culture Blood Aerobic and Anaerobic        PRELIM      08/10/16 08:21  08/10/16   No Growth after 1 day/s of incubation.      Glucose Whole Blood - POCT [[id number]]  (Abnormal) Collected:  08/10/16 0728     Updated:  08/10/16 0731     POCT - Glucose Whole blood 147 (H) mg/dL     Basic Metabolic Panel [[id number]]  (Abnormal) Collected:  08/10/16 0400    Specimen:  Blood Updated:  08/10/16 0441     Glucose 138 (H) mg/dL      BUN 56.2 (H) mg/dL      Creatinine 1.7 (H) mg/dL      Calcium 8.1 mg/dL      Sodium 130 mEq/L      Potassium 4.1 mEq/L      Chloride 111 mEq/L      CO2 21 (L) mEq/L     Magnesium [[id number]]  (Abnormal) Collected:  08/10/16 0400    Specimen:  Blood Updated:  08/10/16 0441     Magnesium 1.4 (L) mg/dL     Phosphorus [[id number]] Collected:  08/10/16 0400    Specimen:  Blood Updated:  08/10/16 0441     Phosphorus 4.0 mg/dL     GFR [[id number]] Collected:  08/10/16 0400     Updated:  08/10/16 0441     EGFR 29.0    Prothrombin time/INR [[id number]] Collected:  08/10/16 0400    Specimen:  Blood Updated:  08/10/16 0429     PT 13.5 sec      PT INR 1.0     PT Anticoag. Given Within 48 hrs. None    CBC and differential [[id number]]  (Abnormal) Collected:  08/10/16 0400    Specimen:  Blood from Blood Updated:  08/10/16 0422     WBC 8.18 x10 3/uL      Hgb 10.7 (L) g/dL      Hematocrit 56.4 (L) %      Platelets 281 x10 3/uL      RBC 3.88 (L) x10 6/uL      MCV 87.1 fL      MCH 27.6 (L) pg      MCHC 31.7 (L) g/dL      RDW 15 %      MPV 9.7 fL      Neutrophils 56.4 %      Lymphocytes Automated 28.0 %      Monocytes 9.9 %      Eosinophils Automated 4.2 %      Basophils  Automated 0.9 %      Immature Granulocyte 0.6 %      Nucleated RBC 0.0 /100 WBC      Neutrophils Absolute 4.62 x10 3/uL      Abs Lymph Automated 2.29 x10 3/uL      Abs Mono Automated 0.81 x10 3/uL      Abs Eos Automated 0.34 x10 3/uL      Absolute Baso Automated 0.07 x10 3/uL      Absolute Immature Granulocyte 0.05 x10 3/uL      Absolute NRBC 0.00 x10 3/uL     Glucose Whole Blood - POCT [[id number]]  (Abnormal) Collected:  08/09/16 2058     Updated:  08/09/16 2104     POCT - Glucose Whole blood 209 (H) mg/dL     Blood Culture Aerobic/Anaerobic #2 [[id number]] Collected:  08/08/16 1406    Specimen:  Arm from Blood Updated:  08/09/16 1721    Narrative:       ORDER#: [id number]                                    ORDERED BY: Elizbeth Squires  SOURCE: Blood arm                                    COLLECTED:  08/08/16 14:06  ANTIBIOTICS AT COLL.:                                RECEIVED :  08/08/16 16:26  Culture Blood Aerobic and Anaerobic        PRELIM      08/09/16 17:21  08/09/16   No Growth after 1 day/s of incubation.      Glucose Whole Blood - POCT [[id number]]  (Abnormal) Collected:  08/09/16 1646     Updated:  08/09/16 1648     POCT - Glucose Whole blood 183 (H) mg/dL     Urine culture [[id number]] Collected:  08/08/16 1513    Specimen:  Urine from Urine, Catheterized, In & Out Updated:  08/09/16 1611    Narrative:       ORDER#: [id number]                                    ORDERED BY: Elizbeth Squires  SOURCE: Urine, Catheterized, In & Out                COLLECTED:  08/08/16 15:13  ANTIBIOTICS AT COLL.:                                RECEIVED :  08/08/16 15:26  Culture Urine                              FINAL       08/09/16 16:10   +  08/09/16   10,000 - 30,000 CFU/ML Enterococcus species               No further work,             Questionable significance due to low quantity    08/09/16   30,000 - 50,000 CFU/ML Gram negative rod               Lactose fermenting gram negative rod, No further work,             Questionable  significance due to low quantity.        Blood Culture Aerobic/Anaerobic #1 [[id number]] Collected:  08/08/16 1318    Specimen:  Arm from Blood Updated:  08/09/16 1521    Narrative:       ORDER#: [id number]                                    ORDERED BY: Elizbeth Squires  SOURCE: Blood arm  COLLECTED:  08/08/16 13:18  ANTIBIOTICS AT COLL.:                                RECEIVED :  08/08/16 14:39  Culture Blood Aerobic and Anaerobic        PRELIM      08/09/16 15:21  08/09/16   No Growth after 1 day/s of incubation.            Rads:   Ct Angiogram Head    Result Date: 08/08/2016  1.  Possible very subtle perfusion abnormality about the posterior aspect of left MCA territory. No definite thrombosis or definite penumbra. 2.  Lucency evident involving the right medial occipital cortex and the left cerebellar cortex, consistent with ischemia, age-indeterminate but likely old. 3.  Atherosclerotic stenosis as noted above.  Einar Pheasant, MD 08/08/2016 1:23 PM     Ct Head Wo Contrast    Result Date: 08/08/2016      Lucency of the left cerebellar cortex in the right medial occipital cortex, findings consistent with infarction, age indeterminant but likely old.    Einar Pheasant, MD 08/08/2016 12:49 PM     Ct Head W Contrast    Result Date: 08/08/2016  1.  Possible very subtle perfusion abnormality about the posterior aspect of left MCA territory. No definite thrombosis or definite penumbra. 2.  Lucency evident involving the right medial occipital cortex and the left cerebellar cortex, consistent with ischemia, age-indeterminate but likely old. 3.  Atherosclerotic stenosis as noted above.  Einar Pheasant, MD 08/08/2016 1:23 PM     Ct Angiogram Cerebral Perfusion W 3d Reconstruction    Result Date: 08/08/2016  1.  Possible very subtle perfusion abnormality about the posterior aspect of left MCA territory. No definite thrombosis or definite penumbra. 2.  Lucency evident involving the right medial occipital  cortex and the left cerebellar cortex, consistent with ischemia, age-indeterminate but likely old. 3.  Atherosclerotic stenosis as noted above.  Einar Pheasant, MD 08/08/2016 1:23 PM     Ct Angiogram Neck    Result Date: 08/08/2016  1.  Possible very subtle perfusion abnormality about the posterior aspect of left MCA territory. No definite thrombosis or definite penumbra. 2.  Lucency evident involving the right medial occipital cortex and the left cerebellar cortex, consistent with ischemia, age-indeterminate but likely old. 3.  Atherosclerotic stenosis as noted above.  Einar Pheasant, MD 08/08/2016 1:23 PM     Mri Brain Wo Contrast    Result Date: 08/09/2016   1. Small, acute cortical infarct involving a single left parietal gyrus without mass effect or hemorrhage. 2. Generalized cerebral volume loss and advanced sequela of chronic microvascular change. 3. Chronic left cerebellar infarct. 4. Limited exam. Waynard Edwards, MD 08/09/2016 9:26 PM     Fluoroscopy Retrograde Pyelogram With And Without Kub    Result Date: 08/05/2016   1. Retrograde pyelogram under fluoroscopic guidance. 2. Short segment severe narrowing distal right ureter. 3. Please see operative report for further details.  Jasmine December  D'Heureux, MD 08/05/2016 8:22 PM     US Renal Kidney    Result Date: 08/09/2016   Bilateral collecting system dilatation right more than left. Stent present on the right can be followed from the renal pelvis into the bladder. Bladder trabeculation. Mild increase in renal cortical echogenicity bilaterally. Comment: Findings discussed with the patient's attending physician at the  time of dictation. Annabell Sabal, MD 08/09/2016 5:37 PM     Xr Chest  Ap Portable    Result Date: 08/08/2016   Hypoventilatory change, basilar atelectasis. Geanie Cooley, MD 08/08/2016 1:32 PM         Signed by:  Norvel Richards. Clemon Chambers, FNP-BC  Nurse Practitioner  [facility] Neurology  Pager: [phone]  Philis Kendall [phone]  After Hours:  [phone]    Please see attending Neurologist note that accompanies this mid-level encounter note.

## 2016-08-10 NOTE — Progress Notes (Signed)
[facility] Inpatient Rehab Note:  Following patient for potential inpatient rehab needs, continued PT/OT recommendations, and medical clearance.  Minta Balsam, RN-BC, CRRN  [facility]-Rehab Admission Liaison  [phone]    Will meet with the patient at bedside to discuss rehab at [facility].

## 2016-08-11 ENCOUNTER — Inpatient Hospital Stay: Payer: Medicare Other

## 2016-08-11 LAB — BASIC METABOLIC PANEL
BUN: 28 mg/dL — ABNORMAL HIGH (ref 7.0–19.0)
CO2: 19 mEq/L — ABNORMAL LOW (ref 22–29)
Calcium: 8.7 mg/dL (ref 7.9–10.2)
Chloride: 114 mEq/L — ABNORMAL HIGH (ref 100–111)
Creatinine: 1.4 mg/dL — ABNORMAL HIGH (ref 0.6–1.0)
Glucose: 141 mg/dL — ABNORMAL HIGH (ref 70–100)
Potassium: 4.6 mEq/L (ref 3.5–5.1)
Sodium: 141 mEq/L (ref 136–145)

## 2016-08-11 LAB — GLUCOSE WHOLE BLOOD - POCT
Whole Blood Glucose POCT: 141 mg/dL — ABNORMAL HIGH (ref 70–100)
Whole Blood Glucose POCT: 144 mg/dL — ABNORMAL HIGH (ref 70–100)
Whole Blood Glucose POCT: 185 mg/dL — ABNORMAL HIGH (ref 70–100)
Whole Blood Glucose POCT: 403 mg/dL — ABNORMAL HIGH (ref 70–100)

## 2016-08-11 LAB — GFR: EGFR: 36.3

## 2016-08-11 LAB — MAGNESIUM: Magnesium: 1.6 mg/dL (ref 1.6–2.6)

## 2016-08-11 MED ORDER — AMLODIPINE BESYLATE 5 MG PO TABS
10.0000 mg | ORAL_TABLET | Freq: Every day | ORAL | Status: DC
Start: 2016-08-12 — End: 2016-08-21
  Administered 2016-08-12 – 2016-08-19 (×7): 10 mg via ORAL
  Filled 2016-08-11 (×10): qty 2

## 2016-08-11 MED ORDER — AMLODIPINE BESYLATE 5 MG PO TABS
5.0000 mg | ORAL_TABLET | Freq: Once | ORAL | Status: AC
Start: 2016-08-11 — End: 2016-08-11
  Administered 2016-08-11: 5 mg via ORAL
  Filled 2016-08-11: qty 1

## 2016-08-11 MED ORDER — AMLODIPINE BESYLATE 5 MG PO TABS
5.0000 mg | ORAL_TABLET | Freq: Every day | ORAL | Status: DC
Start: 2016-08-11 — End: 2016-08-11
  Administered 2016-08-11: 5 mg via ORAL
  Filled 2016-08-11: qty 1

## 2016-08-11 MED ORDER — HYDRALAZINE HCL 20 MG/ML IJ SOLN
10.0000 mg | Freq: Four times a day (QID) | INTRAMUSCULAR | Status: DC | PRN
Start: 2016-08-11 — End: 2016-08-23

## 2016-08-11 MED ORDER — METOPROLOL SUCCINATE ER 50 MG PO TB24
50.0000 mg | ORAL_TABLET | Freq: Every day | ORAL | Status: DC
Start: 2016-08-12 — End: 2016-08-11

## 2016-08-11 MED ORDER — METOPROLOL SUCCINATE ER 50 MG PO TB24
100.0000 mg | ORAL_TABLET | Freq: Every day | ORAL | Status: DC
Start: 2016-08-12 — End: 2016-08-21
  Administered 2016-08-12 – 2016-08-19 (×7): 100 mg via ORAL
  Filled 2016-08-11 (×10): qty 2

## 2016-08-11 MED ORDER — MAGNESIUM SULFATE IN D5W 1-5 GM/100ML-% IV SOLN
1.0000 g | INTRAVENOUS | Status: AC
Start: 2016-08-11 — End: 2016-08-11
  Administered 2016-08-11 (×2): 1 g via INTRAVENOUS
  Filled 2016-08-11: qty 100

## 2016-08-11 NOTE — Anesthesia Preprocedure Evaluation (Addendum)
Anesthesia Evaluation    AIRWAY    Mallampati: I    TM distance: >3 FB  Neck ROM: full  Mouth Opening:full   CARDIOVASCULAR    cardiovascular exam normal       DENTAL    no notable dental hx     PULMONARY    pulmonary exam normal     OTHER FINDINGS    Milana, Salay ([id number]) - 80 y.o. Female     Anesthesia History       Hypertension   Hyperlipidemia  Type 2 diabetes mellitus, controlled   Arthritis  Depression   Urinary tract infection  Difficulty walking   Surgical History       CORONARY ARTERY BYPASS   FOOT AMPUTATION  JOINT REPLACEMENT   Substance History       Smoking Status: Never Smoker  Smokeless Tobacco Status: Unknown  Alcohol use: No  Drug use: Not Asked  Problem List       Hydronephrosis of right kidney      NOTE:    1. HTN controlled with Toprol    2. DM type 2. Takes Lantus 10 units q night. Only took 5 units last night. Accu check = 150    3. HLD    4. CAD. S/p CABG. Not currently symptomatic    5. Uses a wheelchair due to instability/ loss of balance secondary to tor amputation.           Relevant Problems   No active problems are marked relevant to this note.               Anesthesia Plan    ASA 3     general                     intravenous induction   Detailed anesthesia plan: general IV        Post op pain management: per surgeon          pertinent labs reviewed               Signed by: Comer Locket 08/11/16 2:43 PM    ===============================================================  Inpatient Anesthesia Evaluation    Patient Name: [facility]  Surgeon: * Surgery not found *  Patient Age / Sex: 80 y.o. / female    Medical History:     Past Medical History:   Diagnosis Date   . Arthritis    . Depression    . Difficulty walking     unsteady gait uses walker/cane/wheelchair   . Hyperlipidemia    . Hypertension    . Kidney disorder    . Type 2 diabetes mellitus, controlled    . Urinary tract infection     multiple time on Nitrofurantion maintaining dose       Past Surgical History:    Procedure Laterality Date   . CORONARY ARTERY BYPASS  2010   . CYSTOSCOPY, URETERAL STENT INSERTION  08/05/2016    Procedure: CYSTOSCOPY,RIGHT/RETROGRADE, RIGHT/ URETEROSCOPY, STENT PLACEMENT.;  Surgeon: Otelia Santee, MD;  Location: [facility] OR;  Service: Urology;;  CYSTOSCOPY,RIGHT/RETROGRADE, RIGHT/ URETEROSCOPY, STENT PLACEMENT.   Marland Kitchen FOOT AMPUTATION  2015    x2 three toes   . JOINT REPLACEMENT  2010    left knee         Allergies:     Allergies   Allergen Reactions   . Tape Itching         Medications:     Current  Facility-Administered Medications   Medication Dose Route Frequency Last Rate Last Dose   . 0.9%  NaCl infusion   Intravenous Continuous 125 mL/hr at 08/11/16 0912     . acetaminophen (TYLENOL) tablet 650 mg  650 mg Oral Q4H PRN   650 mg at 08/08/16 2347    Or   . acetaminophen (TYLENOL) suppository 650 mg  650 mg Rectal Q4H PRN       . amLODIPine (NORVASC) tablet 5 mg  5 mg Oral Daily   5 mg at 08/11/16 1330   . atorvastatin (LIPITOR) tablet 40 mg  40 mg Oral QHS   40 mg at 08/10/16 2137   . cefTRIAXone (ROCEPHIN) 1 g in sodium chloride 0.9 % 100 mL IVPB mini-bag plus  1 g Intravenous Q24H 200 mL/hr at 08/10/16 2030 1 g at 08/10/16 2030   . clopidogrel (PLAVIX) tablet 75 mg  75 mg Oral Daily   75 mg at 08/11/16 0912   . dextrose (GLUCOSE) 40 % oral gel 15 g of glucose  15 g of glucose Oral PRN        And   . dextrose (D10W) 10% bolus 125 mL  125 mL Intravenous PRN        And   . glucagon (rDNA) (GLUCAGEN) injection 1 mg  1 mg Intramuscular PRN       . heparin (porcine) injection 5,000 Units  5,000 Units Subcutaneous Q8H SCH   5,000 Units at 08/10/16 2143   . hydrALAZINE (APRESOLINE) injection 5 mg  5 mg Intravenous Q6H PRN   5 mg at 08/10/16 0436   . insulin lispro (HumaLOG) injection 1-5 Units  1-5 Units Subcutaneous TID AC PRN   2 Units at 08/10/16 1710   . LORazepam (ATIVAN) injection 1 mg  1 mg Intravenous Q8H PRN   1 mg at 08/09/16 1939   . [START ON 08/12/2016] metoprolol XL (TOPROL-XL)  24 hr tablet 50 mg  50 mg Oral Daily       . ondansetron (ZOFRAN-ODT) disintegrating tablet 4 mg  4 mg Oral Q6H PRN        Or   . ondansetron (ZOFRAN) injection 4 mg  4 mg Intravenous Q6H PRN       . sodium chloride (PF) 0.9 % flush 3 mL  3 mL Intravenous Q8H   3 mL at 08/11/16 0100              Prior to Admission medications    Medication Sig Start Date End Date Taking? Authorizing Provider   aspirin EC 81 MG EC tablet Take 81 mg by mouth daily.    [provider]   atorvastatin (LIPITOR) 40 MG tablet Take 40 mg by mouth daily.    [provider]   DULoxetine (CYMBALTA) 60 MG capsule Take 60 mg by mouth daily.    [provider]   ferrous sulfate 325 (65 FE) MG tablet Take 325 mg by mouth every morning with breakfast.    [provider]   Insulin Glargine (LANTUS SC) Inject 10 Unit into the skin nightly.Took 5 units 08/04/16        [provider]   liraglutide (VICTOZA) 18 MG/3ML Solution Pen-injector Inject 1.8 each into the skin daily.        [provider]   metoprolol XL (TOPROL-XL) 25 MG 24 hr tablet Take 25 mg by mouth daily.    [provider]   nitrofurantoin (MACRODANTIN) 50 MG capsule Take 2  capsules (100 mg total) by mouth every 12 (twelve) hours. 08/05/16   Tescher, Hoyle Sauer, MD   Probiotic Product (PROBIOTIC ADVANCED PO) Take by mouth.    [provider]     Vitals   Temp:  [35.6 C (96.1 F)-36.9 C (98.4 F)] 35.6 C (96.1 F)  Heart Rate:  [59-88] 59  Resp Rate:  [16-18] 18  BP: (132-171)/(64-84) 162/75    Wt Readings from Last 3 Encounters:   08/08/16 65.8 kg (145 lb)   08/05/16 65.8 kg (145 lb)     BMI (Estimated body mass index is 24.13 kg/m as calculated from the following:    Height as of this encounter: 1.651 m (5\' 5" ).    Weight as of this encounter: 65.8 kg (145 lb).)  Temp Readings from Last 3 Encounters:   08/11/16 (!) 35.6 C (96.1 F)   08/05/16 36.8 C (98.3 F)     BP Readings from Last 3 Encounters:   08/11/16  162/75   08/05/16 170/80     Pulse Readings from Last 3 Encounters:   08/11/16 (!) 59   08/05/16 76           Labs:   CBC:  Lab Results   Component Value Date    WBC 8.18 08/10/2016    HGB 10.7 (L) 08/10/2016    HCT 33.8 (L) 08/10/2016    PLT 281 08/10/2016       Chemistries:  Lab Results   Component Value Date    NA 141 08/11/2016    K 4.6 08/11/2016    CL 114 (H) 08/11/2016    CO2 19 (L) 08/11/2016    BUN 28.0 (H) 08/11/2016    CREAT 1.4 (H) 08/11/2016    GLU 141 (H) 08/11/2016    HGBA1C 7.7 (H) 08/09/2016    CA 8.7 08/11/2016    AST 14 08/09/2016       Coags:  Lab Results   Component Value Date    PT 13.5 08/10/2016    PTT 33 08/09/2016    INR 1.0 08/10/2016     _____________________      Signed by: Comer Locket  08/11/16   2:44 PM    =============================================================      ===============================================================  Inpatient Anesthesia Evaluation    Patient Name: [facility]  Surgeon: * Surgery not found *  Patient Age / Sex: 80 y.o. / female    Medical History:     Past Medical History:   Diagnosis Date   . Arthritis    . Coronary artery disease    . Depression    . Difficulty walking     unsteady gait uses walker/cane/wheelchair   . Hyperlipidemia    . Hypertension    . Kidney disorder    . Type 2 diabetes mellitus, controlled    . Urinary tract infection     multiple time on Nitrofurantion maintaining dose       Past Surgical History:   Procedure Laterality Date   . AMPUTATION, TOES, MULTIPLE Left 2014   . CORONARY ARTERY BYPASS  2010   . CYSTOSCOPY, URETERAL STENT INSERTION  08/05/2016    Procedure: CYSTOSCOPY,RIGHT/RETROGRADE, RIGHT/ URETEROSCOPY, STENT PLACEMENT.;  Surgeon: Otelia Santee, MD;  Location: [facility] OR;  Service: Urology;;  CYSTOSCOPY,RIGHT/RETROGRADE, RIGHT/ URETEROSCOPY, STENT PLACEMENT.   Marland Kitchen FOOT AMPUTATION  2015    x2 three toes   . JOINT REPLACEMENT  2010    left knee  Allergies:     Allergies   Allergen Reactions   .  Tape Itching         Medications:     Current Facility-Administered Medications   Medication Dose Route Frequency Last Rate Last Dose   . acetaminophen (TYLENOL) tablet 650 mg  650 mg Oral Q4H PRN   650 mg at 08/08/16 2347    Or   . acetaminophen (TYLENOL) suppository 650 mg  650 mg Rectal Q4H PRN       . amLODIPine (NORVASC) tablet 10 mg  10 mg Oral Daily       . atorvastatin (LIPITOR) tablet 40 mg  40 mg Oral QHS   40 mg at 08/11/16 2135   . cefTRIAXone (ROCEPHIN) 1 g in sodium chloride 0.9 % 100 mL IVPB mini-bag plus  1 g Intravenous Q24H 200 mL/hr at 08/11/16 2050 1 g at 08/11/16 2050   . clopidogrel (PLAVIX) tablet 75 mg  75 mg Oral Daily   75 mg at 08/11/16 0912   . dextrose (GLUCOSE) 40 % oral gel 15 g of glucose  15 g of glucose Oral PRN        And   . dextrose (D10W) 10% bolus 125 mL  125 mL Intravenous PRN        And   . glucagon (rDNA) (GLUCAGEN) injection 1 mg  1 mg Intramuscular PRN       . heparin (porcine) injection 5,000 Units  5,000 Units Subcutaneous Q8H SCH   5,000 Units at 08/11/16 2134   . hydrALAZINE (APRESOLINE) injection 10 mg  10 mg Intravenous Q6H PRN       . insulin lispro (HumaLOG) injection 1-5 Units  1-5 Units Subcutaneous TID AC PRN   5 Units at 08/11/16 2122   . LORazepam (ATIVAN) injection 1 mg  1 mg Intravenous Q8H PRN   1 mg at 08/09/16 1939   . metoprolol XL (TOPROL-XL) 24 hr tablet 100 mg  100 mg Oral Daily       . ondansetron (ZOFRAN-ODT) disintegrating tablet 4 mg  4 mg Oral Q6H PRN        Or   . ondansetron (ZOFRAN) injection 4 mg  4 mg Intravenous Q6H PRN       . sodium chloride (PF) 0.9 % flush 3 mL  3 mL Intravenous Q8H   3 mL at 08/12/16 0100              Prior to Admission medications    Medication Sig Start Date End Date Taking? Authorizing Provider   aspirin EC 81 MG EC tablet Take 81 mg by mouth daily.    [provider]   atorvastatin (LIPITOR) 40 MG tablet Take 40 mg by mouth daily.    [provider]   DULoxetine (CYMBALTA) 60 MG capsule Take 60 mg  by mouth daily.    [provider]   ferrous sulfate 325 (65 FE) MG tablet Take 325 mg by mouth every morning with breakfast.    [provider]   Insulin Glargine (LANTUS SC) Inject 10 Unit into the skin nightly.Took 5 units 08/04/16        [provider]   liraglutide (VICTOZA) 18 MG/3ML Solution Pen-injector Inject 1.8 each into the skin daily.        [provider]   metoprolol XL (TOPROL-XL) 25 MG 24 hr tablet Take 25 mg by mouth daily.    [provider]   nitrofurantoin (MACRODANTIN) 50 MG  capsule Take 2 capsules (100 mg total) by mouth every 12 (twelve) hours. 08/05/16   Tescher, Hoyle Sauer, MD   Probiotic Product (PROBIOTIC ADVANCED PO) Take by mouth.    [provider]     Vitals   Temp:  [35.6 C (96.1 F)-37 C (98.6 F)] 35.9 C (96.6 F)  Heart Rate:  [62-85] 85  Resp Rate:  [15-19] 15  BP: (135-217)/(74-96) 181/95    Wt Readings from Last 3 Encounters:   08/08/16 65.8 kg (145 lb)   08/05/16 65.8 kg (145 lb)     BMI (Estimated body mass index is 24.13 kg/m as calculated from the following:    Height as of this encounter: 1.651 m (5\' 5" ).    Weight as of this encounter: 65.8 kg (145 lb).)  Temp Readings from Last 3 Encounters:   08/12/16 (!) 35.9 C (96.6 F) (Oral)   08/05/16 36.8 C (98.3 F)     BP Readings from Last 3 Encounters:   08/12/16 (!) 181/95   08/05/16 170/80     Pulse Readings from Last 3 Encounters:   08/12/16 85   08/05/16 76           Labs:   CBC:  Lab Results   Component Value Date    WBC 8.18 08/10/2016    HGB 10.7 (L) 08/10/2016    HCT 33.8 (L) 08/10/2016    PLT 281 08/10/2016       Chemistries:  Lab Results   Component Value Date    NA 137 08/12/2016    K 3.8 08/12/2016    CL 111 08/12/2016    CO2 21 (L) 08/12/2016    BUN 25.0 (H) 08/12/2016    CREAT 1.3 (H) 08/12/2016    GLU 160 (H) 08/12/2016    HGBA1C 7.7 (H) 08/09/2016    CA 8.4 08/12/2016    AST 14 08/09/2016       Coags:  Lab Results   Component Value Date    PT 13.5  08/10/2016    PTT 33 08/09/2016    INR 1.0 08/10/2016     _____________________      Signed by: Sindy Guadeloupe Landree Fernholz  08/12/16   12:03 PM    =============================================================  [facility]  ,    Transthoracic Echocardiogram  2D, M-mode, Doppler, and Color Doppler  Study date:  09-Aug-2016    Patient: NONA GRACEY  MR #: [id number]  Account #: [id number]  DOB: [birth year]  Age: 76 years  Gender: Female  Height: 65 in  Weight: 144.8 lb  BSA: 1.73 m  BP: 162/ 79    Allergies: TAPE    Sonographer:  Hillery Jacks, RDCS  Cardiologist:  Elspeth Cho, MD    CLINICAL QUESTION: Evaluate for suspected cardiac source of embolism.    HISTORY: PRIOR HISTORY: DM2,HLD,Depression, Arthritis,CAD,CABG    PROCEDURE: The procedure was performed at the bedside. This was a routine  study. The transthoracic approach was used. The study included complete 2D  imaging, M-mode, complete spectral Doppler, and color Doppler. Intravenous  contrast (agitated saline) was administered to evaluate shunting.  Echocardiographic views were limited by restricted patient mobility and poor  patient compliance. This was a technically difficult study.    MEASUREMENT TABLES    DOPPLER MEASUREMENTS  Aortic valve   (Reference normals)  Peak vel   233 cm/s   (--)  Mean vel   165 cm/s   (--)  VTI   46 cm   (--)  Peak gradient  22 mmHg   (--)  Mean gradient   12 mmHg   (--)  Valve area   1.26 cm   (--)    SYSTEM MEASUREMENT TABLES    2D mode  AoR Diam (2D): 36 mm  Asc Aorta Diam (2D): 32 mm  LA Dimension (2D): 34 mm  LA ESV: 55700 mm3  LA/Ao (2D): 0.9  LASV: 32.2 ml/m2  EF Teichholz (2D mode): 65.7 %  FS (2D-Teich): 35.6 %  IVSd (2D): 13.4 mm  IVSd (2D): 13.4 mm  LVIDd (2D): 39.6 mm  LVIDs (2D): 25.5 mm  LVOT diam (2D mode): 314 mm  LVPWd (2D): 13 mm  RVIDd (2D): 21.9 mm    Apical four chamber  LA ESV Index (A4C): 46.1 ml/m2  LV Area Systolic (A4C): 1750 mm  LV ESV (A4C): 41800 mm3  LV Length Systolic (A4C):  16.1 mm    Apical two chamber  LA ESV Index (A2C): 20.5 ml/m2    Unspecified Scan Mode  AVA (VTI): 124 mm  AVA (Vmax): 126 mm  Mean Gradient: 12 mm[Hg]  Mean Velocity: 1650 mm/s  Vmax: 2330 mm/s  LVOT Diameter: 20 mm  LVOT Peak Gradient: 4 mm[Hg]  LVOT Peak Velocity: 938 mm/s  LVOT VTI: 182 mm  Deceleration Time: 169 ms  MV Peak A Vel: 1010 mm/s  Acceleration Time: 143 ms  PI End Diag Vel: 887 mm/s  Vmax: 751 mm/s  RVSP: 30 mm[Hg]  Regurgitation Vmax: 2430 mm/s    LEFT VENTRICLE: Size was normal. Systolic function was normal. Ejection  fraction was estimated to be 65 %. There were no regional wall motion  abnormalities. Wall thickness was mildly to moderately increased. Doppler:  There was an increased relative contribution of atrial contraction to  ventricular filling. Doppler parameters were consistent with abnormal left  ventricular relaxation (grade 1 diastolic dysfunction).    AORTIC VALVE: The valve was trileaflet. Leaflets exhibited mildly to moderately  increased thickness and mildly reduced cuspal separation. Doppler: There was  moderate stenosis.    AORTA: The root exhibited normal size. The ascending aorta was normal in size.    MITRAL VALVE: Valve structure was normal. There was normal leaflet separation.  Doppler: The transmitral velocity was within the normal range. There was no  evidence for stenosis. There was no regurgitation.    LEFT ATRIUM: The atrium was mildly dilated.    ATRIAL SEPTUM: No defect or patent foramen ovale was identified.    RIGHT VENTRICLE: The size was normal. Systolic function was normal. Doppler:  Systolic pressure was within the normal range. Estimated peak pressure was 30  mmHg.    PULMONIC VALVE: Doppler: There was no regurgitation.    TRICUSPID VALVE: The valve structure was normal. There was normal leaflet  separation. Doppler: The transtricuspid velocity was within the normal range.  There was no evidence for tricuspid stenosis. There was mild  regurgitation.    RIGHT ATRIUM: Size was normal. There was a medium-sized, spherical, echogenic,  fixed mass on the right side of the interatrial septum. It may represent a  myxoma.    SYSTEMIC VEINS: IVC: The inferior vena cava was not well visualized.    PERICARDIUM: There was no pericardial effusion. The pericardium was normal in  appearance.    SUMMARY:    -  Left ventricle:  -  Size was normal.  -  Systolic function was normal. Ejection fraction was estimated to be 65 %.  -  There were no regional wall motion abnormalities.  -  Wall thickness was mildly to moderately increased.  -  There was an increased relative contribution of atrial contraction to  ventricular filling. -  Doppler parameters were consistent with abnormal left  ventricular relaxation (grade 1 diastolic dysfunction).    -  Right atrium:  -  Size was normal.  -  There was a medium-sized, spherical, echogenic, fixed mass on the right side  of the interatrial septum. 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    COMPARISONS:  The previous study was not available for direct comparison.    RECOMMENDATIONS:  If clinically indicated, transesophageal echocardiography could provide  additional information. The attending physician was notified of these results.    Prepared and signed by    Elspeth Cho, MD  Signed 09-Aug-2016 12:50:23

## 2016-08-11 NOTE — Progress Notes (Signed)
Patient arrived via stretcher with transport.  Son at bedside.  AA&Ox4.  BP high with diastolic btw 99 and 120.  Anesthesia informed.  Will contact Dr Levy Sjogren.  Left sided IV easily patent/flushed with saline without pain or swelling.  Aware of procedure and verbalized agreement to doing procedure.  BP deemed to high/will reschedule for tomorrow.  Call to floor RN. Emphasis on lower BP for tomorrow.

## 2016-08-11 NOTE — SLP Progress Note (Signed)
[facility]   Speech Therapy Cancellation Note      Patient:  Glenda Green MRN#:  [id number]  Unit:  [facility] TOWER 6 EAST Room/Bed:  F619/F619.01    08/11/2016  Time: 14:54      Patient not seen for speech therapy secondary to pt is NPO and off floor for TEE. SLP s/w RN. SLP will f/u at another time as able/appropriate. -- Mary Sella M.A. CCC-SLP #[id number], 08/11/2016

## 2016-08-11 NOTE — OT Progress Note (Signed)
[facility]   Occupational Therapy Treatment     Patient: Glenda Green    MRN#: [id number]   Unit: [facility] TOWER 6 EAST  Bed: F619/F619.01      Discharge Recommendations:   Discharge Recommendation: SNF   DME Recommended for Discharge: [facility], Other (Comment)    If SNF recommended discharge disposition is not available, patient will need min/mod hands on assist for ADL functional mobility ,balance personal hygiene IADLs with above,  equipment, and Home OT.     Assessment:   Received pt supine in bed alert and oriented speech much better today followed simple directions,edema right UE noted. Educated pt and family to keep UE elevated and encourage AROM right hand demonstrated learning. Pt walked to bath room with RW and CGA to perform sink side ADL pt was able to brush teeth in standing with supervision bathing UB supervision LB minA ,donned gown and socks with CGA. Pt has foley and incontinent of Bowel (at baseline)needed max a for perineal care. Pt with visual field WFL pt is making progress towards goals.     Treatment Activities: ADL training,functional transfers,    Educated the patient to role of occupational therapy, plan of care, goals of therapy and safety with mobility and ADLs.    Plan:    OT Frequency Recommended: 2-3x/wk     Continue plan of care.       Precautions and Contraindications:   Up as tolerated,fall     Updated Medical Status/Imaging/Labs:  reviewed    Subjective:I am waitning for test   Patient's medical condition is appropriate for Occupational Therapy intervention at this time.  Patient is agreeable to participation in the therapy session. Family and/or guardian are agreeable to patient's participation in the therapy session. Nursing clears patient for therapy.    Pain:   Scale: 0/10  Location:   Intervention:     Objective:   Patient is in bed with telemetry, SCD's and peripheral IV in place.daughter in law at bed side      Cognition  Alert and oriented  followed simple directions speech much better today. Followed simple directions    Functional Mobility  Rolling:  supervision  Supine to Sit: CGA  Sit to Stand: minA with Rw  Transfers: mina with RW    Balance  Static Sitting: good  Dynamic Sitting: good-  Static Standing: fair+ with RW  Dynamic Standing: fair+ with RW    Self Care and Home Management  Eating: hand to mouth I  Grooming: supervision  Bathing: UB supervision LB modA  UE Dressing: gown supervision  LE Dressing: socks supervision  Toileting: foley ,maxA for perineal care    Therapeutic Exercises  With activity     Participation: good  Endurance: good    Patient left with call bell within reach, all needs met, SCDs on, fall mat in place, bed alarm NA, chair alarm on and all questions answered. RN notified of session outcome and patient response.     Goals:  Time For Goal Achievement: 5 visits  ADL Goals  Patient will dress upper body: Independent, 5 visits, without AE  Patient will dress lower body: Independent, 5 visits, without AE  Pt will complete bathing: Modified Independent, 5 visits  Mobility and Transfer Goals  Pt will perform functional transfers: Modified Independent, 5 visits, with rolling walker  Neuro Re-Ed Goals  Pt will perform dynamic sitting balance: Independent, 5 visits, to increase ability to complete ADLs  Pt will  perform dynamic standing balance: Modified Independent, 5 visits, to complete standing ADLs safely, for 10 minutes  Musculoskeletal Goals  Pt will demonstrate increase of strength: to 4+/5, to increase ability to complete ADLs, 5 visits  Other Goal: increase activity tolerance to good  Executive Fucntion Goals  Pt will demonstrate safety: independent, 5 visits, to increase ability to complete ADLs, instituting safety techniques                Ander Purpura, MA/OTR, CSRS, LSVT-BIG  [phone]    Time of Treatment  OT Received On: 08/11/16  Start Time: 0915  Stop Time: 1000  Time Calculation (min): 45 min    Treatment # 1 of 5  visits

## 2016-08-11 NOTE — Progress Notes (Signed)
HOSPITALIST PROGRESS NOTE    Date Time: 08/11/16 12:25 PM  Patient Name: Glenda Green  Attending Physician: Gildardo Griffes, DO        Subjective:   Patient seen and examined. She is very alert and interactive this morning. AAO x 3.    RN at bedside - as part of [facility]o rounding.    Family updated by me at bedside.     For TEE and Repeat Renal US today.      Physical Exam:     Vitals:    08/11/16 1139   BP: 162/75   Pulse: (!) 59   Resp: 18   Temp: (!) 96.1 F (35.6 C)   SpO2: 97%       Intake and Output Summary (Last 24 hours) at Date Time    Intake/Output Summary (Last 24 hours) at 08/11/16 1225  Last data filed at 08/11/16 0912   Gross per 24 hour   Intake                0 ml   Output             3350 ml   Net            -3350 ml       General: awake, alert, oriented x 3; no acute distress, mental status is much improved today  HEENT: perrla, eomi, sclera anicteric  oropharynx clear without lesions, mucous membranes moist  Neck: supple, no lymphadenopathy, no thyromegaly, no JVD, no carotid bruits  Cardiovascular: regular rate and rhythm, ++ systolic murmur, rubs or gallops  Lungs: clear to auscultation bilaterally, without wheezing, rhonchi, or rales  Abdomen: soft, non-tender, non-distended; no palpable masses, no hepatosplenomegaly, normoactive bowel sounds, no rebound or guarding  Extremities: no clubbing, cyanosis, or edema  Neuro: cranial nerves grossly intact, moves all extremities, RUE pronator drift, R Hemineglect  Skin: no rashes or lesions noted      Medications:     Current Facility-Administered Medications   Medication Dose Route Frequency   . atorvastatin  40 mg Oral QHS   . cefTRIAXone  1 g Intravenous Q24H   . clopidogrel  75 mg Oral Daily   . heparin (porcine)  5,000 Units Subcutaneous Q8H SCH   . metoprolol XL  25 mg Oral Daily   . sodium chloride (PF)  3 mL Intravenous Q8H         Labs:     Results     Procedure Component Value Units Date/Time    Glucose Whole Blood - POCT [[id number]]   (Abnormal) Collected:  08/11/16 1146     Updated:  08/11/16 1157     POCT - Glucose Whole blood 144 (H) mg/dL     CULTURE BLOOD AEROBIC AND ANAEROBIC [[id number]] Collected:  08/09/16 0439    Specimen:  Arterial Blood Updated:  08/11/16 0821    Narrative:       ORDER#: [id number]                                    ORDERED BY: Gildardo Griffes  SOURCE: Arterial Blood iv                            COLLECTED:  08/09/16 04:39  ANTIBIOTICS AT COLL.:  RECEIVED :  08/09/16 07:24  Culture Blood Aerobic and Anaerobic        PRELIM      08/11/16 08:21  08/10/16   No Growth after 1 day/s of incubation.  08/11/16   No Growth after 2 day/s of incubation.      Glucose Whole Blood - POCT [[id number]]  (Abnormal) Collected:  08/11/16 0731     Updated:  08/11/16 0753     POCT - Glucose Whole blood 141 (H) mg/dL     Glucose Whole Blood - POCT [[id number]]  (Abnormal) Collected:  08/10/16 2109     Updated:  08/10/16 2117     POCT - Glucose Whole blood 242 (H) mg/dL     Blood Culture Aerobic/Anaerobic #2 [[id number]] Collected:  08/08/16 1406    Specimen:  Arm from Blood Updated:  08/10/16 1721    Narrative:       ORDER#: [id number]                                    ORDERED BY: Salley Scarlet, KA  SOURCE: Blood arm                                    COLLECTED:  08/08/16 14:06  ANTIBIOTICS AT COLL.:                                RECEIVED :  08/08/16 16:26  Culture Blood Aerobic and Anaerobic        PRELIM      08/10/16 17:21  08/09/16   No Growth after 1 day/s of incubation.  08/10/16   No Growth after 2 day/s of incubation.      Glucose Whole Blood - POCT [[id number]]  (Abnormal) Collected:  08/10/16 1639     Updated:  08/10/16 1643     POCT - Glucose Whole blood 215 (H) mg/dL     Blood Culture Aerobic/Anaerobic #1 [[id number]] Collected:  08/08/16 1318    Specimen:  Arm from Blood Updated:  08/10/16 1521    Narrative:       ORDER#: [id number]                                    ORDERED BY: Salley Scarlet, KA  SOURCE:  Blood arm                                    COLLECTED:  08/08/16 13:18  ANTIBIOTICS AT COLL.:                                RECEIVED :  08/08/16 14:39  Culture Blood Aerobic and Anaerobic        PRELIM      08/10/16 15:21  08/09/16   No Growth after 1 day/s of incubation.  08/10/16   No Growth after 2 day/s of incubation.              Radiology:     Radiology Results (24 Hour)     ** No results found for the last 24 hours. **  Assessment:     Patient Active Problem List   Diagnosis   . Hydronephrosis of right kidney   . CVA (cerebral vascular accident)   . Atrial myxoma   . Bilateral hydronephrosis   . UTI (urinary tract infection)   . AKI (acute kidney injury)         Plan:   # Left MCA Syndrome with ACUTE CVA  - Continue PO Plavix 75mg  Daily  - Continue Lipitor 40mg   - Suspecting cardio embolic CVA - patient has R Atrial Myxoma -- will get TEE today (at 3pm)  - Continue on cardiac monitor  - Noted MRI/MRA results    # R Atrial Myxoma  - Noted on Echo  - Appreciate Cardiology recommendations  - TEE today    # AKI  - Renal Function - BMP pending - if no improvement - will need Nephro consult  - Discussed with urologist - who mentions to repeat Renal US today (already ordered)  - Keep Foley in for now    # Bilateral Hydronephrosis   - Continue Foley as per discussion with urology  - F/U official Urology consult    # UTI  - Continue IV Ceftriaxone for now -- will switch to PO prior to discharge  - Noted urine cultures    # HTN  - Continue Metoprolol 50mg    - Starting Amlodipine 5mg    - Needs good and aggressive BP control  - Continue to monitor BP    # DM2  - Continue SSI      DVT Prophylaxis: SQ Heparin  Diet: NPO for TEE today  Anticipated discharge disposition and date:  1 or 2 days    Signed by: Gildardo Griffes, D.O.   cc:Pcp, Noneorunknown, MD

## 2016-08-11 NOTE — Progress Notes (Signed)
Pt voiced no c/o pain or discomfort. Pt has been NPO status since MN for TEE. 0600 Heparin Injection not administered. Remains on Rocephin IV due to UTI. NS infusing @ 125 ml/hr.

## 2016-08-11 NOTE — Plan of Care (Signed)
Pt is AxO3-4. Vital signs are stable. Follows commands. Scheduled medications given. Scheduled TEE canceled today due to high blood pressure. TEE has been rescheduled to tomorrow morning 9/20 @ 11am. Pt NPO at midnight. Pt denies pain. Family at beside. Bed in lowest position, fall precautions in place, call bell within reach, will continue to monitor.

## 2016-08-12 ENCOUNTER — Inpatient Hospital Stay: Payer: Medicare Other

## 2016-08-12 ENCOUNTER — Inpatient Hospital Stay: Payer: Medicare Other | Admitting: Anesthesiology

## 2016-08-12 LAB — PHOSPHORUS: Phosphorus: 3.5 mg/dL (ref 2.3–4.7)

## 2016-08-12 LAB — BASIC METABOLIC PANEL
BUN: 25 mg/dL — ABNORMAL HIGH (ref 7.0–19.0)
CO2: 21 mEq/L — ABNORMAL LOW (ref 22–29)
Calcium: 8.4 mg/dL (ref 7.9–10.2)
Chloride: 111 mEq/L (ref 100–111)
Creatinine: 1.3 mg/dL — ABNORMAL HIGH (ref 0.6–1.0)
Glucose: 160 mg/dL — ABNORMAL HIGH (ref 70–100)
Potassium: 3.8 mEq/L (ref 3.5–5.1)
Sodium: 137 mEq/L (ref 136–145)

## 2016-08-12 LAB — GFR: EGFR: 39.6

## 2016-08-12 LAB — MAGNESIUM: Magnesium: 2.1 mg/dL (ref 1.6–2.6)

## 2016-08-12 LAB — GLUCOSE WHOLE BLOOD - POCT: Whole Blood Glucose POCT: 248 mg/dL — ABNORMAL HIGH (ref 70–100)

## 2016-08-12 MED ORDER — INFLUENZA VAC SPLIT HIGH-DOSE 0.5 ML IM SUSY
0.5000 mL | PREFILLED_SYRINGE | Freq: Once | INTRAMUSCULAR | Status: AC
Start: 2016-08-12 — End: 2016-08-12
  Administered 2016-08-12: 0.5 mL via INTRAMUSCULAR
  Filled 2016-08-12: qty 0.5

## 2016-08-12 MED ORDER — PROPOFOL 10 MG/ML IV EMUL (WRAP)
INTRAVENOUS | Status: AC
Start: 2016-08-12 — End: ?
  Filled 2016-08-12: qty 50

## 2016-08-12 MED ORDER — GLYCOPYRROLATE 0.2 MG/ML IJ SOLN
INTRAMUSCULAR | Status: AC
Start: 2016-08-12 — End: ?
  Filled 2016-08-12: qty 1

## 2016-08-12 MED ORDER — PROPOFOL INFUSION 10 MG/ML
INTRAVENOUS | Status: DC | PRN
Start: 2016-08-12 — End: 2016-08-12
  Administered 2016-08-12: 130 ug/kg/min via INTRAVENOUS

## 2016-08-12 MED ORDER — GLYCOPYRROLATE 0.2 MG/ML IJ SOLN
INTRAMUSCULAR | Status: DC | PRN
Start: 2016-08-12 — End: 2016-08-12
  Administered 2016-08-12: 0.2 mg via INTRAVENOUS

## 2016-08-12 MED ORDER — PROPOFOL INFUSION 10 MG/ML
INTRAVENOUS | Status: DC | PRN
Start: 2016-08-12 — End: 2016-08-12
  Administered 2016-08-12 (×2): 60 mg via INTRAVENOUS

## 2016-08-12 NOTE — PT Progress Note (Signed)
Physical Therapy Cancellation Note    Patient: Glenda Green  ZOX:[id number]    Unit: J811/B147.82    Patient not seen for physical therapy secondary to pt off unit for TEE. Will attempt again as schedule permits.     Renato Gails, PT,DPT pager [phone] 08/12/2016 11:37 AM

## 2016-08-12 NOTE — Progress Notes (Signed)
CV Surgery NP w/ Dr Orie Fisherman, met with patient and son to discuss (R) atrial mass excision.  Patient will need clearance from Neurology in light of recent L MCA CVA. Rec additional imaging to eval for hemorrhagic conversion prior to CV OR.   Hx of CABG 2010, she will also need an ischemic evaluation. Will obtain op report from [facility]    Above discussed w/ Patient and son    Silvano Rusk, ACNP

## 2016-08-12 NOTE — Progress Notes (Signed)
Pt here for TEE with Dr. Levy Sjogren to assess possible etiology of stroke.  Verified NPO status, pt denies any (current or hx of) dysphagia. Pt is A&Ox4, MAE, no apparent deficits. Currently pt is exhibiting no residuals from the stoke. Pt grip strength is full and equal bilaterally, face symmetrical, speech clear and appropriate.     Procedure explained, questions addressed, informed consent obtained.     Final pause: 1217  Throat sprayed: none  Probe in: 1223  Bubble study: none  Probe out: 1239    Pt tolerated procedure well. TEE showed Myxoma. Post procedure VSS and returned to baseline during recovery. Pt A&Ox4, MAE, no changes in mentation. Pt denies any pain or sore throat at this time. Education materials on Myxoma provided to pt and son.     Report called to [facility]. Pt updated on POC and taken back to room by transport in stable condition after monitoring completed.

## 2016-08-12 NOTE — Progress Notes (Signed)
TEE (full report to follow):    Normal LV funciton  AV appears moderately stenotic  Large RA mass taking up most or FRA cavity but not obstructing or prolapsing across the TV orifice  Is at least 5.5 cm in height and 3.5 cm in width  Atrial septum has bidirectional bowing with a small degree of penetrating color flow so suspect there is a small atrial shunt such that she could have embolized from RA to LA to systemic circulation.    Have asked CV surgery to evaluate. Patient's son, Loraine Leriche, is aware.

## 2016-08-12 NOTE — Transfer of Care (Signed)
Anesthesia Transfer of Care Note    Patient: Glenda Green    Procedures performed: TEE    Anesthesia type: General TIVA    Patient location:Phase I PACU    Last vitals:   Vitals:    08/12/16 1132   BP: (!) 181/95   Pulse: 85   Resp: 15   Temp:    SpO2: 99%       Post pain: Patient not complaining of pain, continue current therapy      Mental Status:awake and alert     Respiratory Function: tolerating nasal cannula    Cardiovascular: stable    Nausea/Vomiting: patient not complaining of nausea or vomiting    Hydration Status: adequate    Post assessment: no apparent anesthetic complications    Signed by: Santiago Bumpers Saragrace Selke  08/12/16 12:48 PM

## 2016-08-12 NOTE — Progress Notes (Signed)
Pt has been NPO status since MN for schedule TEE @ 11am. Heparin Injection not administered. Pt voiced no c/o pain or discomfort. Appears to be resting comfortably.

## 2016-08-12 NOTE — Progress Notes (Signed)
HOSPITALIST PROGRESS NOTE    Date Time: 08/12/16 2:37 PM  Patient Name: Glenda Green  Attending Physician: Gildardo Griffes, DO        Subjective:   Patient seen and examined. She is very alert and interactive this morning. AAO x 3.    RN at bedside - as part of Trio rounding.    Family updated by me at bedside.     TEE done already - confirms myxoma.       Physical Exam:     Vitals:    08/12/16 1431   BP: 156/86   Pulse: 79   Resp: 18   Temp: 97.6 F (36.4 C)   SpO2: 98%       Intake and Output Summary (Last 24 hours) at Date Time    Intake/Output Summary (Last 24 hours) at 08/12/16 1437  Last data filed at 08/12/16 1100   Gross per 24 hour   Intake                0 ml   Output             3450 ml   Net            -3450 ml       General: awake, alert, oriented x 3; no acute distress, mental status is much improved today  HEENT: perrla, eomi, sclera anicteric  oropharynx clear without lesions, mucous membranes moist  Neck: supple, no lymphadenopathy, no thyromegaly, no JVD, no carotid bruits  Cardiovascular: regular rate and rhythm, ++ systolic murmur, rubs or gallops  Lungs: clear to auscultation bilaterally, without wheezing, rhonchi, or rales  Abdomen: soft, non-tender, non-distended; no palpable masses, no hepatosplenomegaly, normoactive bowel sounds, no rebound or guarding  Extremities: no clubbing, cyanosis, or edema  Neuro: cranial nerves grossly intact, moves all extremities, RUE pronator drift, R Hemineglect -- neuro exam improving  Skin: no rashes or lesions noted      Medications:     Current Facility-Administered Medications   Medication Dose Route Frequency   . amLODIPine  10 mg Oral Daily   . atorvastatin  40 mg Oral QHS   . cefTRIAXone  1 g Intravenous Q24H   . clopidogrel  75 mg Oral Daily   . heparin (porcine)  5,000 Units Subcutaneous Q8H SCH   . metoprolol XL  100 mg Oral Daily   . sodium chloride (PF)  3 mL Intravenous Q8H         Labs:     Results     Procedure Component Value Units  Date/Time    CULTURE BLOOD AEROBIC AND ANAEROBIC [[id number]] Collected:  08/09/16 0439    Specimen:  Arterial Blood Updated:  08/12/16 0821    Narrative:       ORDER#: [id number]                                    ORDERED BY: Gildardo Griffes  SOURCE: Arterial Blood iv                            COLLECTED:  08/09/16 04:39  ANTIBIOTICS AT COLL.:                                RECEIVED :  08/09/16 07:24  Culture Blood Aerobic and Anaerobic  PRELIM      08/12/16 08:21  08/10/16   No Growth after 1 day/s of incubation.  08/11/16   No Growth after 2 day/s of incubation.  08/12/16   No Growth after 3 day/s of incubation.      Phosphorus [[id number]] Collected:  08/12/16 0330    Specimen:  Blood Updated:  08/12/16 0424     Phosphorus 3.5 mg/dL     Basic Metabolic Panel [[id number]]  (Abnormal) Collected:  08/12/16 0330    Specimen:  Blood Updated:  08/12/16 0424     Glucose 160 (H) mg/dL      BUN 78.2 (H) mg/dL      Creatinine 1.3 (H) mg/dL      Calcium 8.4 mg/dL      Sodium 956 mEq/L      Potassium 3.8 mEq/L      Chloride 111 mEq/L      CO2 21 (L) mEq/L     Magnesium [[id number]] Collected:  08/12/16 0330    Specimen:  Blood Updated:  08/12/16 0424     Magnesium 2.1 mg/dL     GFR [[id number]] Collected:  08/12/16 0330     Updated:  08/12/16 0424     EGFR 39.6    Glucose Whole Blood - POCT [[id number]]  (Abnormal) Collected:  08/11/16 2110     Updated:  08/11/16 2130     POCT - Glucose Whole blood 403 (H) mg/dL     Blood Culture Aerobic/Anaerobic #2 [[id number]] Collected:  08/08/16 1406    Specimen:  Arm from Blood Updated:  08/11/16 1721    Narrative:       ORDER#: [id number]                                    ORDERED BY: Salley Scarlet, KA  SOURCE: Blood arm                                    COLLECTED:  08/08/16 14:06  ANTIBIOTICS AT COLL.:                                RECEIVED :  08/08/16 16:26  Culture Blood Aerobic and Anaerobic        PRELIM      08/11/16 17:21  08/09/16   No Growth after 1 day/s of  incubation.  08/10/16   No Growth after 2 day/s of incubation.  08/11/16   No Growth after 3 day/s of incubation.      Glucose Whole Blood - POCT [[id number]]  (Abnormal) Collected:  08/11/16 1641     Updated:  08/11/16 1645     POCT - Glucose Whole blood 185 (H) mg/dL     Blood Culture Aerobic/Anaerobic #1 [[id number]] Collected:  08/08/16 1318    Specimen:  Arm from Blood Updated:  08/11/16 1521    Narrative:       ORDER#: [id number]                                    ORDERED BY: Elizbeth Squires  SOURCE: Blood arm  COLLECTED:  08/08/16 13:18  ANTIBIOTICS AT COLL.:                                RECEIVED :  08/08/16 14:39  Culture Blood Aerobic and Anaerobic        PRELIM      08/11/16 15:21  08/09/16   No Growth after 1 day/s of incubation.  08/10/16   No Growth after 2 day/s of incubation.  08/11/16   No Growth after 3 day/s of incubation.              Radiology:     Radiology Results (24 Hour)     Procedure Component Value Units Date/Time    US Renal Kidney [[id number]] Collected:  08/12/16 0725    Order Status:  Completed Updated:  08/12/16 0802    Narrative:       INDICATION: HEMATURIA.  80 year old female with hydronephrosis underwent  ureteral stenting three days ago. The patient presents with hematuria.  Medical history includes: Hypertension, diabetes and hyperlipidemia.                           COMPARISON: Renal ultrasound 08/09/2016.              TECHNIQUE: US RENAL KIDNEY BLADDER COMPLETE: Renal ultrasound was  performed with real time imaging.  Imaging of the urinary bladder was  also performed.                FINDINGS: Evaluation is somewhat limited by body habitus.     The right  kidney measures roughly 11.5 cm in sagittal length.     The left kidney  measures roughly 10.5 cm in sagittal length.     The left kidney shows  no hydronephrosis. The right kidney shows moderate hydronephrosis. This  hydronephrosis is grossly unchanged.  The renal stent is again noted in  the  dilated right renal pelvis.     The kidneys show no solid masses or  significant focal parenchymal abnormality.     Green 4.4 cm simple appearing  lower pole right renal cyst is noted. No perinephric fluid is seen.      There are no definite calculi.         Overall renal parenchymal  echotexture is normal.     There is no visible ascites.     There is no  obvious pelvic mass.     The urinary bladder is empty and therefore  cannot be evaluated. The bladder is decompressed by Green Foley catheter.                                           Impression:        Renal ultrasound shows no significant change in the degree  of right hydronephrosis despite adequate positioning of the ureteral  stent.         The urinary bladder is decompressed by Green Foley catheter.                                 Miguel Dibble, MD   08/12/2016 7:58 AM              Assessment:  Patient Active Problem List   Diagnosis   . Hydronephrosis of right kidney   . CVA (cerebral vascular accident)   . Atrial myxoma   . Bilateral hydronephrosis   . UTI (urinary tract infection)   . AKI (acute kidney injury)         Plan:   # Left MCA Syndrome with ACUTE CVA  - Continue PO Plavix 75mg  Daily  - Continue Lipitor 40mg   - TEE confirms: Large RA mass taking up most or FRA cavity but not obstructing or prolapsing across the TV orifice. Size: at least 5.5 cm in height and 3.5 cm in width.  - Continue on cardiac monitor  - Noted MRI/MRA results    # R Atrial Myxoma  - Noted on Echo and confirmed on TEE  - TEE confirms: Large RA mass taking up most or FRA cavity but not obstructing or prolapsing across the TV orifice. Size: at least 5.5 cm in height and 3.5 cm in width.  - Appreciate Cardiology recommendations  - Will need cardiothoracic evaluation       # AKI  - Renal function improving - repeat Renal US shows decompressed bladder (s/p Foley)  - Keep Foley in for now  - F/U Urology recommendations    # Bilateral Hydronephrosis   - Continue Foley as per discussion with  urology    # UTI  - Continue IV Ceftriaxone for now -- will switch to PO prior to discharge  - Noted urine cultures    # HTN  - Continue Metoprolol 100mg    - Continue Amlodipine 10mg   - Needs good and aggressive BP control  - Continue to monitor BP    # DM2  - Continue SSI      DVT Prophylaxis: SQ Heparin  Diet: Cardiac Diet  Anticipated discharge disposition and date:  1 or 2 days    Signed by: Gildardo Griffes, D.O.   cc:Pcp, Noneorunknown, MD

## 2016-08-12 NOTE — Plan of Care (Signed)
Pt A&Ox3-4, follows commands, MAE. 4/5 strength, equal. Pupils equal, reactive. VSS. On room air, lungs clear.  Plan for TEE today at 11:00- pt has been NPO since midnight. Pt sent down for testing at 11:10 in stable condition.  Foley catheter intact and draining clear yellow.    Fall precautions in place: bed in lowest position, call light within reach, fall mat in place, bed alarm on.   Will continue to monitor

## 2016-08-13 ENCOUNTER — Inpatient Hospital Stay: Payer: Medicare Other

## 2016-08-13 LAB — CBC
Absolute NRBC: 0 10*3/uL
Hematocrit: 36.6 % — ABNORMAL LOW (ref 37.0–47.0)
Hgb: 11.7 g/dL — ABNORMAL LOW (ref 12.0–16.0)
MCH: 27.5 pg — ABNORMAL LOW (ref 28.0–32.0)
MCHC: 32 g/dL (ref 32.0–36.0)
MCV: 86.1 fL (ref 80.0–100.0)
MPV: 10 fL (ref 9.4–12.3)
Nucleated RBC: 0 /100 WBC (ref 0.0–1.0)
Platelets: 343 10*3/uL (ref 140–400)
RBC: 4.25 10*6/uL (ref 4.20–5.40)
RDW: 15 % (ref 12–15)
WBC: 7.68 10*3/uL (ref 3.50–10.80)

## 2016-08-13 LAB — BASIC METABOLIC PANEL
BUN: 28 mg/dL — ABNORMAL HIGH (ref 7.0–19.0)
CO2: 13 mEq/L — ABNORMAL LOW (ref 22–29)
Calcium: 8.9 mg/dL (ref 7.9–10.2)
Chloride: 108 mEq/L (ref 100–111)
Creatinine: 1.6 mg/dL — ABNORMAL HIGH (ref 0.6–1.0)
Glucose: 295 mg/dL — ABNORMAL HIGH (ref 70–100)
Potassium: 5.3 mEq/L — ABNORMAL HIGH (ref 3.5–5.1)
Sodium: 134 mEq/L — ABNORMAL LOW (ref 136–145)

## 2016-08-13 LAB — PT/INR
PT INR: 0.9 (ref 0.9–1.1)
PT: 11.8 s — ABNORMAL LOW (ref 12.6–15.0)

## 2016-08-13 LAB — GLUCOSE WHOLE BLOOD - POCT
Whole Blood Glucose POCT: 175 mg/dL — ABNORMAL HIGH (ref 70–100)
Whole Blood Glucose POCT: 176 mg/dL — ABNORMAL HIGH (ref 70–100)
Whole Blood Glucose POCT: 201 mg/dL — ABNORMAL HIGH (ref 70–100)
Whole Blood Glucose POCT: 253 mg/dL — ABNORMAL HIGH (ref 70–100)
Whole Blood Glucose POCT: 306 mg/dL — ABNORMAL HIGH (ref 70–100)

## 2016-08-13 LAB — APTT: PTT: 31 s (ref 23–37)

## 2016-08-13 LAB — GFR: EGFR: 31.2

## 2016-08-13 MED ORDER — INSULIN LISPRO 100 UNIT/ML SC SOLN
1.0000 [IU] | Freq: Every evening | SUBCUTANEOUS | Status: DC | PRN
Start: 2016-08-13 — End: 2016-08-14
  Administered 2016-08-13: 2 [IU] via SUBCUTANEOUS

## 2016-08-13 MED ORDER — SODIUM BICARBONATE 650 MG PO TABS
1300.0000 mg | ORAL_TABLET | Freq: Four times a day (QID) | ORAL | Status: AC
Start: 2016-08-13 — End: 2016-08-16
  Administered 2016-08-13 – 2016-08-16 (×11): 1300 mg via ORAL
  Administered 2016-08-16: 650 mg via ORAL
  Filled 2016-08-13 (×12): qty 2

## 2016-08-13 MED ORDER — HEPARIN (PORCINE) IN D5W 50-5 UNIT/ML-% IV SOLN (UNITS/KG/HR ONLY)
12.0000 [IU]/kg/h | INTRAVENOUS | Status: DC
Start: 2016-08-13 — End: 2016-08-22
  Administered 2016-08-13 – 2016-08-15 (×2): 12 [IU]/kg/h via INTRAVENOUS
  Administered 2016-08-16: 13 [IU]/kg/h via INTRAVENOUS
  Administered 2016-08-17: 12 [IU]/kg/h via INTRAVENOUS
  Administered 2016-08-18: 14 [IU]/kg/h via INTRAVENOUS
  Administered 2016-08-19 – 2016-08-20 (×2): 13 [IU]/kg/h via INTRAVENOUS
  Filled 2016-08-13 (×8): qty 500

## 2016-08-13 MED ORDER — SODIUM BICARBONATE 650 MG PO TABS
1300.0000 mg | ORAL_TABLET | Freq: Once | ORAL | Status: AC
Start: 2016-08-13 — End: 2016-08-13
  Administered 2016-08-13: 1300 mg via ORAL
  Filled 2016-08-13: qty 2

## 2016-08-13 MED ORDER — SODIUM CHLORIDE 0.9 % IV SOLN
INTRAVENOUS | Status: DC
Start: 2016-08-13 — End: 2016-08-13

## 2016-08-13 MED ORDER — IOHEXOL 240 MG/ML IJ SOLN
50.0000 mL | Freq: Once | INTRAMUSCULAR | Status: AC | PRN
Start: 2016-08-13 — End: 2016-08-13
  Administered 2016-08-13: 50 mL via ORAL

## 2016-08-13 NOTE — Progress Notes (Signed)
Pt is currently undergoing CT - our Practice will see in am    A.  1. Acute on CRF; non oliguric; multifactorial etiology; ? chronic right obstruction (right ureteral stent in place although we will need to clarify duration and date of placement); bladder outlet obstruction (resolution of left HN with Foley)      2. Metabolic acidosis; positive anion gap + non anion gap      3.  Anemia; normocytic/hypochromic      4.  Left MCA distribution CVA likely associated with left atrial mass      5.  MMPs      Recommendations:      1. NS @ 75cc/hr starting upon return from CT - pt is to undergo cardiac cath tomorrow      2.  NaHCO3 1300mg  po qid at present      3.  Tighter hold parameters for anti-hypertensives      4.   Lasix renal scan would be recommended in order to assess patency of right ureteral stent although in light of over-riding cardiac and CNS issues would hold      5.  qam labs    Further recommendations to follow    Thank you

## 2016-08-13 NOTE — Plan of Care (Signed)
Problem: Pain  Goal: Pain at adequate level as identified by patient  Outcome: Progressing   08/13/16 0443   Goal/Interventions addressed this shift   Pain at adequate level as identified by patient Assess pain on admission, during daily assessment and/or before any "as needed" intervention(s);Identify patient comfort function goal;Reassess pain within 30-60 minutes of any procedure/intervention, per Pain Assessment, Intervention, Reassessment (AIR) Cycle       Problem: Every Day - Stroke  Goal: Neurological status is stable or improving  Outcome: Not Progressing   08/13/16 0443   Goal/Interventions addressed this shift   Neurological status is stable or improving Monitor/assess/document neurological assessment (Stroke: every 4 hours)       Comments: A&OX4, FC, Foley draining clear yellow urine, IV ABT administered with no adverse effect, Incontinent care given for BM, O2 RA, PO tolerated, Pt requested 1 tylenol d/t Flu vaccination administered, OOB to BR with walker and 1 person assist, Will continue to monitor for needs and safety, Call light in reach, Falls precautions in use.

## 2016-08-13 NOTE — Progress Notes (Signed)
HOSPITALIST PROGRESS NOTE    Date Time: 08/13/16 3:13 PM  Patient Name: Glenda Green  Attending Physician: Gildardo Griffes, DO        Subjective:   Patient seen and examined. She is very alert and interactive this morning. AAO x 3. No chest pain, SOB, abdominal pain, N/V/D. No fevers or chills. No headache.    RN at bedside - as part of [facility] rounding.    Family updated by me at bedside.     TEE done already - confirms myxoma.     For CT Chest / Abdomen/ Pelvis today.  For Cardiac Cath tomorrow.  For CT surgery next week.      Physical Exam:     Vitals:    08/13/16 1211   BP: 129/72   Pulse: 65   Resp: 18   Temp: (!) 91.9 F (33.3 C)   SpO2: 98%       Intake and Output Summary (Last 24 hours) at Date Time    Intake/Output Summary (Last 24 hours) at 08/13/16 1513  Last data filed at 08/13/16 1000   Gross per 24 hour   Intake                0 ml   Output             2800 ml   Net            -2800 ml       General: awake, alert, oriented x 3; no acute distress, mental status is much improved today  HEENT: perrla, eomi, sclera anicteric  oropharynx clear without lesions, mucous membranes moist  Neck: supple, no lymphadenopathy, no thyromegaly, no JVD, no carotid bruits  Cardiovascular: regular rate and rhythm, ++ systolic murmur, rubs or gallops  Lungs: clear to auscultation bilaterally, without wheezing, rhonchi, or rales  Abdomen: soft, non-tender, non-distended; no palpable masses, no hepatosplenomegaly, normoactive bowel sounds, no rebound or guarding  Extremities: no clubbing, cyanosis, or edema  Neuro: cranial nerves grossly intact, moves all extremities, RUE pronator drift, R Hemineglect -- neuro exam improving  Skin: no rashes or lesions noted      Medications:     Current Facility-Administered Medications   Medication Dose Route Frequency   . amLODIPine  10 mg Oral Daily   . atorvastatin  40 mg Oral QHS   . metoprolol XL  100 mg Oral Daily         Labs:     Results     Procedure Component Value Units  Date/Time    MRSA culture [[id number]] Collected:  08/13/16 1156    Specimen:  Body Fluid from Nares Updated:  08/13/16 1458    Basic Metabolic Panel (if NOT done in last 48) [[id number]]  (Abnormal) Collected:  08/13/16 1325    Specimen:  Blood Updated:  08/13/16 1354     Glucose 295 (H) mg/dL      BUN 62.9 (H) mg/dL      Creatinine 1.6 (H) mg/dL      Calcium 8.9 mg/dL      Sodium 528 (L) mEq/L      Potassium 5.3 (H) mEq/L      Chloride 108 mEq/L      CO2 13 (L) mEq/L     GFR [[id number]] Collected:  08/13/16 1325     Updated:  08/13/16 1354     EGFR 31.2    Glucose Whole Blood - POCT [[id number]]  (Abnormal) Collected:  08/13/16 1207  Updated:  08/13/16 1235     POCT - Glucose Whole blood 201 (H) mg/dL     Glucose Whole Blood - POCT [[id number]]  (Abnormal) Collected:  08/13/16 0744     Updated:  08/13/16 0917     POCT - Glucose Whole blood 176 (H) mg/dL     CULTURE BLOOD AEROBIC AND ANAEROBIC [[id number]] Collected:  08/09/16 0439    Specimen:  Arterial Blood Updated:  08/13/16 0854    Narrative:       ORDER#: [id number]                                    ORDERED BY: Gildardo Griffes  SOURCE: Arterial Blood iv                            COLLECTED:  08/09/16 04:39  ANTIBIOTICS AT COLL.:                                RECEIVED :  08/09/16 07:24  Culture Blood Aerobic and Anaerobic        PRELIM      08/13/16 08:21  08/10/16   No Growth after 1 day/s of incubation.  08/11/16   No Growth after 2 day/s of incubation.  08/12/16   No Growth after 3 day/s of incubation.  08/13/16   No Growth after 4 day/s of incubation.      Glucose Whole Blood - POCT [[id number]]  (Abnormal) Collected:  08/13/16 0542     Updated:  08/13/16 0558     POCT - Glucose Whole blood 175 (H) mg/dL     Blood Culture Aerobic/Anaerobic #2 [[id number]] Collected:  08/08/16 1406    Specimen:  Arm from Blood Updated:  08/12/16 1721    Narrative:       ORDER#: [id number]                                    ORDERED BY: Salley Scarlet, KA  SOURCE: Blood arm                                     COLLECTED:  08/08/16 14:06  ANTIBIOTICS AT COLL.:                                RECEIVED :  08/08/16 16:26  Culture Blood Aerobic and Anaerobic        PRELIM      08/12/16 17:21  08/09/16   No Growth after 1 day/s of incubation.  08/10/16   No Growth after 2 day/s of incubation.  08/11/16   No Growth after 3 day/s of incubation.  08/12/16   No Growth after 4 day/s of incubation.      Glucose Whole Blood - POCT [[id number]]  (Abnormal) Collected:  08/12/16 1639     Updated:  08/12/16 1646     POCT - Glucose Whole blood 248 (H) mg/dL     Blood Culture Aerobic/Anaerobic #1 [[id number]] Collected:  08/08/16 1318    Specimen:  Arm from Blood Updated:  08/12/16 1521    Narrative:  ORDER#: [id number]                                    ORDERED BY: Elizbeth Squires  SOURCE: Blood arm                                    COLLECTED:  08/08/16 13:18  ANTIBIOTICS AT COLL.:                                RECEIVED :  08/08/16 14:39  Culture Blood Aerobic and Anaerobic        PRELIM      08/12/16 15:21  08/09/16   No Growth after 1 day/s of incubation.  08/10/16   No Growth after 2 day/s of incubation.  08/11/16   No Growth after 3 day/s of incubation.  08/12/16   No Growth after 4 day/s of incubation.              Radiology:     Radiology Results (24 Hour)     ** No results found for the last 24 hours. **            Assessment:     Patient Active Problem List   Diagnosis   . Hydronephrosis of right kidney   . CVA (cerebral vascular accident)   . Atrial myxoma   . Bilateral hydronephrosis   . UTI (urinary tract infection)   . AKI (acute kidney injury)         Plan:   # Left MCA Syndrome with ACUTE CVA  - Stop PO Plavix in view of surgery next week (okay by neurology)  - Starting IV Heparin gtt (without Bolus) - as instructed by Neurology  - For cardiac cath tomorrow and surgery next week  - Continue Lipitor 40mg   - TEE confirms: Large RA mass taking up most or FRA cavity but not obstructing or prolapsing  across the TV orifice. Size: at least 5.5 cm in height and 3.5 cm in width.  - Continue on cardiac monitor  - Noted MRI/MRA results    # R Atrial Myxoma  - Noted on Echo and confirmed on TEE  - TEE confirms: Large RA mass taking up most or FRA cavity but not obstructing or prolapsing across the TV orifice. Size: at least 5.5 cm in height and 3.5 cm in width.  - Appreciate Cardiology recommendations  - F/U CT Chest/Abdomen/Pelvis  - CT Surgery - for OR next seek    # AKI  - Renal function worse today - repeat Renal US shows decompressed bladder (s/p Foley)  - Keep Foley in for now  - F/U Urology recommendations  - Will consult Nephrology - in view of worsening renal function    # Bilateral Hydronephrosis   - Continue Foley as per discussion with urology    # UTI  - Continue IV Ceftriaxone for now -- will switch to PO prior to discharge  - Noted urine cultures    # HTN  - Continue Metoprolol 100mg    - Continue Amlodipine 10mg   - Needs good and aggressive BP control  - Continue to monitor BP    # DM2  - Continue SSI      DVT Prophylaxis: SQ Heparin  Diet: Cardiac Diet  Anticipated  discharge disposition and date:  Next week    Signed by: Gildardo Griffes, D.O.   cc:Pcp, Noneorunknown, MD

## 2016-08-13 NOTE — Consults (Signed)
HEART PROGRESS NOTE  [facility]      Date Time: 08/13/16 9:14 AM  Patient Name: Glenda Green, Glenda Green  Medical Record #:  [id number]  Account#:  [id number]  Admission Date:  08/08/2016         Patient Active Problem List   Diagnosis   . Hydronephrosis of right kidney   . CVA (cerebral vascular accident)   . Atrial myxoma   . Bilateral hydronephrosis   . UTI (urinary tract infection)   . AKI (acute kidney injury)       Subjective:   Denies chest pain, SOB or palpitations.    TEE yesterday with large but not obstructive RA mass, small interatrial shunt. CT surgery consult pending, LHC tomorrow preop pending scheduling  Assessment:       Admitted 08/08/16 with aphasia, outside window for lytics, with MRI showing an acute left MCA distribution CVA. Symptoms improving.   RA mass by TTE. Limited image quality. May represent a myxoma. Possible interatrial shunt by TEE. Normal LV function   Moderate AS by TTE 08/08/16   CAD s/p 4V-CABG in 2010 ([place], [place])   HTN labile   Obstructive uropathy recent ureteral stenting 07/2016, creat falling from 1.7 to 1.3   Prior renal artery stenosis w stenting   HLD   DM suboptimally controlled a1c 7.7   OA    Recommendations:    Neuro consult pending to comment on risk hemorrhagic conversion of CVA and timing of open heart surgery/atrial mass excision/PFO closure.   Needs preop LHC, schedule for tomorrow pm, assuming creat continues to improve   Informed consent signed for cath tomorrow afternoon. Risks and benefits carefully reviewed, especially AKI, CVA risk.     Can have BF, npo p 10am, should have gentle IVF while npo- especially given resolving AKI.         Medications:      Scheduled Meds:      amLODIPine 10 mg Oral Daily   atorvastatin 40 mg Oral QHS   clopidogrel 75 mg Oral Daily   heparin (porcine) 5,000 Units Subcutaneous Q8H SCH   metoprolol XL 100 mg Oral Daily   sodium chloride (PF) 3 mL Intravenous Q8H       Continuous Infusions:             Physical Exam:       VITAL SIGNS PHYSICAL EXAM   Vitals:    08/13/16 0850   BP: 132/64   Pulse: 74   Resp:    Temp:    SpO2:      Temp (24hrs), Avg:97.3 F (36.3 C), Min:96.1 F (35.6 C), Max:98.5 F (36.9 C)      Telemetry: Reviewed no changes  SR, prior PVC/bigeminy dec in frequency    Intake/Output Summary (Last 24 hours) at 08/13/16 0914  Last data filed at 08/13/16 0344   Gross per 24 hour   Intake                0 ml   Output             2750 ml   Net            -2750 ml    Physical Exam  General: awake, alert, breathing comfortably, no acute distress  Head: normocephalic  Eyes: EOM's intact  Cardiovascular: regular rate and rhythm, normal S1, S2, no S3, no S4,2/6 SEM murmurs, no rubs or gallops  Neck: no carotid bruits or JVD  Lungs: clear to auscultation  bilateraly, without wheezing, rhonchi, or rales  Abdomen: soft, non-tender, non-distended; no palpable masses,  normoactive bowel sounds  Extremities: no edema  Pulse: equal pulses, 4/4 symmetric  Neurological: mild R UE weakness, fluid speech, no facial droop,   Musculoskeletal: normal strength and tone         Labs:             Recent Labs  Lab 08/09/16  0440   Cholesterol 164   Triglycerides 136   HDL 32*   LDL Calculated 105*       Recent Labs  Lab 08/09/16  0440   Bilirubin, Total 0.7   Protein, Total 6.2   Albumin 3.2*   ALT 10   AST (SGOT) 14       Recent Labs  Lab 08/12/16  0330   Magnesium 2.1       Recent Labs  Lab 08/10/16  0400 08/09/16  0439   PT 13.5 13.5   PT INR 1.0 1.0   PTT  --  33       Recent Labs  Lab 08/10/16  0400 08/09/16  0440 08/08/16  1318   WBC 8.18 10.57 11.86*   Hgb 10.7* 11.3* 11.3*   Hematocrit 33.8* 35.1* 35.5*   Platelets 281 331 315       Recent Labs  Lab 08/12/16  0330 08/11/16  1331 08/10/16  0400   Sodium 137 141 138   Potassium 3.8 4.6 4.1   Chloride 111 114* 111   CO2 21* 19* 21*   BUN 25.0* 28.0* 25.0*   Creatinine 1.3* 1.4* 1.7*   EGFR 39.6 36.3 29.0   Glucose 160* 141* 138*   Calcium 8.4 8.7 8.1            Invalid input(s): FREET4    .No results found for: BNP     Weight Monitoring 07/25/2016 08/05/2016 08/08/2016 08/08/2016   Height 162.6 cm 162.6 cm - 165.1 cm   Height Method Stated - - Estimated   Weight 65.772 kg 65.772 kg 65 kg 65.772 kg   Weight Method Stated - - Estimated   BMI (calculated) 24.9 kg/m2 24.9 kg/m2 - 24.2 kg/m2       Imaging:       ____________________________________________    Signed by: Brett Fairy, MD      [facility]  NP Spectralink [phone] (8am-5pm)  MD Spectralink [phone] or [phone] (8am-5pm)  Arrhythmia Spectralink [phone] (8am-4:30pm)  After hours, non urgent consult line [phone]  After Hours, urgent consults [phone]

## 2016-08-13 NOTE — Plan of Care (Signed)
Scheduling issues in cath and patient still needs Plavix to wash out 5-7 days prior to surgery: will plan on diagnostic LHC Monday and not tomorrow. NPO p MN on Sunday night. The op report of patient's CABG is in shadow chart.

## 2016-08-13 NOTE — Consults (Addendum)
PRELIMINARY                    [facility]                           SURGERY CONSULTATION     Patient Name:           Glenda Green, Glenda Green Date:             08/13/2016            Age      80  [place] MRN:              [id number]             Gender:  Female  [place] Account #:        [id number]          Race:    Caucasian  Date of Birth:          1936-02-05  Scheduled Surgeon:  Consult Performed By:   [facility]:               [facility]  Assessment Location:    Patient Room     Referring Physician(s):  Vives,Mark,M.D.     History of Present Illness:  A cardiac surgery consult has  been request for Ms Weismann who was  admitted on 08/08/2016 with  CVA symptoms found to have an acute L MCA  CVA, she underwent a TEE and  was noted to have a large right atrial  mass (5.5cm width x 3.5cm height).  On the day she presented to the  hospital she was found to be  aphasic by her family. Imaging revealed  acute infarct, she was out  of the window for TPA. As a part of her  Stroke work up she had an echocardiogram  with findings of medium  sized, spherical, echogenic,  fixed mass on the right side of the  interatrial septum, which led  to further imaging with TEE. Prior to  event she reports occasional  dizziness, and exertional dyspnea w/  stairs, no chest pain.     Of note on 08/05/2016 she underwent  a urological evaluation for Right  hydroureteronephrosis and Urinary  incontinence and had a right  urethral stent placed.     She has a PMH of CAD s/p CABG,  HTN, HLD, urinary incontinence, and  recurrent UTI's.     A STS risk score cannot be  calculated for the this surgical  procedure        Past Medical History     Coronary Artery Disease  Diabetes   Control:  insulin  Hypertension  Hypercholesterolemia  Alcohol Consumption:                 no EtOH  Smoking History:       Never  Other:                 Urinary Incontinence, Recurrent UTI     Plavix  stopped 08/13/2016     Past Medical History:     Past Surgical       CABG 2010  [facility])  History:            Left knee  replacement                      left toes  x 3  amputation                      Back surgery  Family History:     MotherCVA                      FatherMI  Social History:     Born in  [place], resided in [place] x 30 years,                      now she  stays with her children here in [place] and                      [place]. She  is retired Immunologist.                      Daughter  in law at bedside at time of                      assessment.  Has assistive devices at home, cane                      and rollator.  After her bypass surgery in 2010                      she went  to rehab prior to returning home  Radiology and Test  CBC  WBC:  8.18; HB: 10.7; Hct: 33.8; PLT: 281;  Review:             BMP  NA:  137;    K: 3.8;    BUN: 25;                      Creatinine:  1.3; Glucose: 160                      Lipids   Total Cholesterol: 164; Triglycerides:                      136; HDL:  32;        LDL: 105;     VLDL: 27;                      Alk Phos:  102;  ALT: 10;    AST: 14                      TP: #1 0.01;                      PT: 13.5;   PTT: 33; INR: 1.0; HbA1c:7.7 ;                         CT angio  Neck                      CT Head  with contrast:                      Focal abnormal  appearance of the right medial                      occipital  cortex, and the left inferior  cerebellar  cortex findings include ischemia,                      ageindeterminate  but likely old. Images are                      limited  due to patient motion.                         CT perfusion  with and without contrast:                      There may  be small focal area of mismatch defect                      involving  the posterior aspect of left MCA                      territory  with slightly increased MTT and TTP                      abnormality  without  corresponding blood volume                      defect.                         CT angiogram  INTRACRANIAL with contrast, CT                      angiogram  EXTRACRANIAL with contrast:  Right anterior circulation: No  occlusion or  thrombosis. Atherosclerotic disease  evident with  mild to moderate narrowing involving  the  cavernous and supraclinoid internal  carotid  artery.  Right Carotid: No occlusion or  thrombosis.  Calcific plaque causing less than  30% stenosis  of the right carotid bulb appear     Left anterior circulation:  No  occlusion or  thrombosis. Mild to moderate narrowing  involving  the left cavernous and supraclinoid  internal  carotid artery due to atherosclerotic  calcification  Left Carotid: No occlusion or  thrombosis.     Posterior circulation: No occlusion  or  thrombosis.  Right Vertebral: No occlusion  or thrombosis.  Left Vertebral: No occlusion or  thrombosis.     IMPRESSION:     1.  Possible very subtle perfusion  abnormality  about the posterior aspect of  left MCA  territory. No definite thrombosis  or definite  penumbra.     2.  Lucency evident involving  the right medial  occipital cortex and the left  cerebellar cortex,  consistent with ischemia, ageindeterminate  but  likely old.     3.  Atherosclerotic stenosis as  noted above     MRI brain  FINDINGS:     There is an ovoid focus of restricted  diffusion  involving a single left parietal  gyrus measuring  15 x 6 mm in size (see series  401 image 25). No  additional restricted diffusion  is evident.     There is generalized parenchymal  volume loss  with prominence of the ventricles  and cortical  sulci. Patchy periventricular,  deep and  subcortical FLAIR signal abnormalities  are  nonspecific but statistically  secondary to advanced chronic  microvascular  sequela. There is a chronic  infarct  within the  left cerebellar periphery.  No extraaxial fluid  collections are seen and the  basal cisterns are  patent.     IMPRESSION:         1. Small, acute cortical infarct  involving a  single left parietal gyrus  without mass effect  or hemorrhage.  2. Generalized cerebral volume  loss and advanced  sequela of chronic microvascular  change.  3. Chronic left cerebellar  infarct.  4. Limited exam.     TRANSTHORACIC ECHOCARDIOGRAM  LEFT VENTRICLE: Size was normal.  Systolic  function was normal. Ejection  fraction was  estimated to be 65 %. There  were no regional  wall motion abnormalities.  Wall thickness was  mildly to moderately increased.  Doppler:  There was an increased relative  contribution  atrial contraction to ventricular  filling.  Doppler parameters were consistent  with abnormal  left ventricular relaxation  (grade 1 diastolic  dysfunction).     AORTIC VALVE: The valve was  trileaflet. Leaflets  exhibited mildly to moderately  increased  thickness and mildly reduced  cuspal separation.  Doppler: There was  moderate stenosis.     AORTA: The root exhibited normal  size. The  ascending aorta was normal  in size.     MITRAL VALVE: Valve structure  was normal. There  was normal leaflet separation.  Doppler: The transmitral velocity  was within  normal range. There was no  evidence for  stenosis. There was no regurgitation.     LEFT ATRIUM: The atrium was  mildly dilated.  ATRIAL SEPTUM: No defect or  patent foramen ovale  was identified.     RIGHT VENTRICLE: The size was  normal. Systolic  function was normal. Doppler:  Systolic pressure was within  the normal range.  Estimated peak pressure was  30  mmHg.     PULMONIC VALVE: Doppler: There  was no  regurgitation.     TRICUSPID VALVE: The valve  structure was normal.  There was normal leaflet  separation. Doppler: The transtricuspid  velocity  was within the normal range.  There was no  evidence for tricuspid stenosis.  There was mild  regurgitation.     RIGHT ATRIUM: Size was normal.  There was a  mediumsized, spherical, echogenic,  fixed mass  on the right side of the interatrial   septum.  may represent a myxoma.     SYSTEMIC VEINS: IVC: The inferior  vena cava was  not well visualized.     PERICARDIUM: There was no pericardial  effusion.  The pericardium was normal  in  appearance.     SUMMARY:       Left ventricle:    Size was normal.    Systolic function was normal.  Ejection  fraction was estimated to be  65 %.    There were no regional wall  motion  abnormalities.    Wall thickness was mildly  to moderately  increased.    There was an increased relative  contribution  of atrial contraction to  ventricular filling.   Doppler  parameters were  consistent with abnormal left  ventricular  relaxation (grade 1 diastolic  dysfunction).    Right atrium:    Size was normal.    There was a mediumsized, spherical,  echogenic, fixed mass on the right  side  of the interatrial septum. It  may represent a  myxoma.     COMPARISONS:  The previous study was not available  for direct  comparison.     RECOMMENDATIONS:  If clinically indicated, transesophageal  echocardiography could provide  additional information. The attending  physician  was notified of these results.        TRANSESOPHAGEAL ECHOCARDGIORAM  SUMMARY:       Left ventricle:    Size was normal.    Systolic function was normal.  Ejection  fraction was estimated to be 55  %.    There were no regional wall  motion  abnormalities.    There was hypokinesis of the  basal  anteroseptal wall(s).    Wall thickness was normal.       Aortic valve:    The valve was trileaflet. Leaflets  exhibited  moderately increased thickness,  moderate calcification, and moderately  reduced  cuspal separation.    Transaortic velocity was within  the normal  range.    There was moderate stenosis.    There was no regurgitation.    There was no evidence for vegetation.       Aorta, systemic arteries:    There was mild atheroma.       Mitral valve:    There was trivial regurgitation.    Left atrial appendage:    The appendage was small.     No thrombus was identified.       Atrial septum:    There was a probable small  atrial septal  defect.    No defect or patent foramen  ovale was  identified.    Doppler evaluation was performed.  There was  righttoleft shunt, in the  baseline state.       Right ventricle:    Systolic function was reduced.       Pulmonic valve:    There was no significant  regurgitation.       Right atrium:    The RA cavity is almost  entirely occupied by  a large (At least 5.5 x 3.5  cm), echodense, somewhat speckledappearing  echogenic mass. The point of  attcahment is not well seen  but it is most  closely associated with the  superior  aspects of the RA. It does  not directly overlay  the tricuspid annulus, nor  does  it prolapse across the tricuspid  orifice during  diastole. 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       Pericardium:    There was no pericardial  effusion.       Recommendation:    Have consulted cardiac surgery  as she has had  a CVA, has a large RA mass  and what appears to  be a small atrial shunt that  could have allowed  tumor or thrombotic material  to ebolize fromt  RA into the systemic circulation.  ALthought he echo appearance  is suggestive of  myxoma, given her age and the  massive nature  the mass A CT scan of the chest  and abdomen  would be suggested to assess  for any systemic                      malignant  process that could have invaded                      the right  heart via venous extension.  RECOMMENDATIONS:                      Have consulted  cardiac surgery as she has had                      CVA, has  a large RA mass and                      what appears  to be a small atrial shunt that                      could  have allowed tumor or                      thrombotic  material to embolize from the RA into                      the systemic  circulation.                      Although  the echo appearance is  suggestive of                      myxoma,  given her age and the massive nature                      the mass  A CT scan of the chest and abdomen                      would  be suggested to assess for any systemic                      malignant  process that could have invaded                      the right  heart via venous extension.        ALLERGY                  DESCRIPTION  TAPE                     Environ  ALLERGIES NOT ON FILE        Home Medications:     MEDICATION     DOSE   UNIT    ROUTE      FREQUENCY   COMMENT  ASA            81     mg      PO         Daily  cymbalta       60     mg      PO         Daily  ferrous        325    mg      PO         Daily  sulfate  Lantus         10     units   SQ         Daily  Lipitor        40     mg      PO         Daily  nitrofuranton  100    mg  PO         Q12H  probiotic      1      tab     PO         Daily  victoza        1.8    ml      SQ         Daily        Hospital Medications:     MEDICATION     DOSE   UNIT    ROUTE      FREQUENCY   COMMENT  plavix         75     mg      PO         Daily  norvasc        10     mg      PO         Daily  Lipitor        40     mg      PO         Daily  Ceftriaxone    1      gram    IV         Daily  Heparin        5000   units   SQ         Q8H  Toprol XL      100    mg      PO         Daily  Physical Examination     HR:     69   BP      139/7 BP          RR: 18  O2     95 % Temp: 98.3               Right:  9    Left:                Sat:  Weight: 144.84   65.69 kg Height: 65 in  165.1  cm          lbs        Review of Systems:     Respiratory  occasional DOE  CV           Denies chest pain.  Abdomen      Denies abdominal pain.  Extremities  Denies leg pain or swelling.  Neurological No gross motor or sensory defects s/p acute  CVA.  No               sudden vision change. Occasional dizziness  report but               syncopal episodes     Physical Exam  General:        Well Nourished, welldeveloped,  in no acute  distress  Skin:           Warm and dry.  Heart:          S1 S2, regular rate  and rhythm, no Rubs, murmurs or                  Gallops.                  sternotomy incision  noted  Lungs:          Clear bilaterally.  Neck:  No bruit  Abdomen:        Soft, nontender,  with active bowel sounds.  Extremities:    No edema, inflammation  or pain.     Diagnosis:  Right Atrial Mass  Acute CVA  CAD s/p CABG  Grade I diastolic dysfunction  Acute Kidney Injury improving  HTN  HLD  DM (HgA1C 7.7)  Urinary Incontinence     Treatment Plan:  Redo Sternotomy, Right Atrial  Mass Excision date TBD     Plavix stopped, last dose 9/21,  discussed w/ neurology NP, will need  to resume post surgery  Head Reimaging prior to CV surgery  Cardiac catherization to eval  coronaries tentatively 08/14/2016  CT chest/abd/pelvis to eval for  systemic malignancy  Carotid US  MRSA screen ordered     Patient was educated on procedure,  risks and benefits. Risks include  but not limited to infection,  bleeding, MI, CVA, renal failure,  death. The patient was given consent  information handouts, including  FAQs about surgical site infections.  All questions were answered.  The patient agrees to proceed  with surgery.              Roderic Ovens, NP    electronically  signed by Arlina Robes on 08/13/2016  11:04:18 AM with status of Approved

## 2016-08-13 NOTE — Progress Notes (Signed)
Discussed w/ cardiology, left heart catherization to eval coronaries, will be scheduled for tomorrow, f/u on CT chest/abd pelvis to eval for systemic malignancy  Her Plavix will also need to be held prior to Right Atrial Mass excision, typically 5 days washout period and can also check a PRU level.    Silvano Rusk, ACNP  CV Surgery Consult   [phone]

## 2016-08-13 NOTE — Progress Notes (Addendum)
[facility] Consult Progress Note    Date Time: 08/13/16   Patient Name: [facility] A  Outpatient Neurologist: n/a    CC: difficulty speaking    Assessment:   80 yo F visiting from [place] with h/o DM, HTN, HLD, on ASA 81mg , recurrent UTIs s/p renal stent 08/05/16 (was off ASA for it), found aphasic on AM of 08/08/16. Partial L MCA syndrome on arrival to [facility], with NIHSS 10. Was out of window for IV-tPA. No LVO on CTA.     1. 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 Recent renal stent 08/05/16    3. H/o DM, HTN, HLD    Recommendations:   - Continue statin for secondary stroke prevention.  O.K. To hold Plavix for cardiac surgery    Recommend low intensity heparin drip (no bolus) while awaiting cardiac surgery to prevent further stroke  - Continue medical management by primary team  - Supportive therapies:  PT/OT/SLP.   Attending additions:  - As above. Since she has to be off Plavix prior to surgery, would favor having her on heparin gtt in the meantime.  - She is neurologically cleared to undergo CT surgery. She is already 5 days post stroke (small infarct) and risk of hemorrhagic conversion by time of surgery would be exceedingly low.     NEUROLOGY ATTENDING NOTE:  I have reviewed the history, imaging and pertinent test results. I have personally examined the patient and confirmed the major physical findings of the NP/PA note. Agree with the assessment and plan as outlined by the mid-level provider above, with any additional considerations as noted below.     Pt seen this morning, son at bedside.   She reports she is doing well. Her speech has improved and she feels like she is able to speak fairly  normally.   On testing, she is able to speak fluently, and repeat a complex sentence. Follows all commands. Able to name objects.   No focal motor deficits.     Reviewed notes from cardiology, CT surgery. Plan for OR sometime next week after pre-op testing as well as heart cath. She needs to be off Plavix for 5 days prior to surgery.     IMP: see the above impression, which has been modified with my additions.  RECS: see the above plan, which includes my additions.     Will continue to follow at intervals.  Please call with any questions/concerns - Neurology Consult Spectralink [phone].    Peggye Form, MD  Neurohospitalist, [facility], Spectralink [phone]  Neurology Consults: Days- Spectralink [phone] (8a-4:30p), [phone]    Interval History/Subjective:   No acute neurological events reported or documented overnight.  Patient is awake, alert, answering questions and following commands appropriately.  Patient's speech seems fluent and intact.  No word finding difficulties noted.  She continues to have mild left lower extremity weakness.  Patient denies headache, dizziness, nausea and vomiting, paresthesias and visual disturbance.    Transesophageal Echocardiogram 08/12/16    -  There was a probable small atrial septal defect.  -  No  defect or patent foramen ovale was identified.  -  The RA cavity is almost entirely occupied by a large (At least 5.5 x 3.5 cm), echodense, somewhat speckled-appearing echogenic mass. The point of attcahment is not well seen but it is most closely associated with the superior aspects of the RA. It does not directly overlay the tricuspid annulus, nor does it prolapse across the tricuspid orifice during diastole. 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    Medications:     Current Facility-Administered Medications   Medication Dose Route Frequency   . amLODIPine  10 mg Oral Daily   . atorvastatin  40 mg Oral QHS   . clopidogrel  75 mg Oral Daily    . heparin (porcine)  5,000 Units Subcutaneous Q8H SCH   . metoprolol XL  100 mg Oral Daily   . sodium chloride (PF)  3 mL Intravenous Q8H       Review of Systems:   All systems reviewed and are negative, except for those mentioned above.     Physical Exam:   Temp:  [96.1 F (35.6 C)-98.5 F (36.9 C)] 98 F (36.7 C)  Heart Rate:  [67-104] 74  Resp Rate:  [11-29] 18  BP: (114-217)/(58-97) 132/64    Vital Signs: Reviewed.    General: well developed, well nourished elderly woman in no acute distress. Cooperative with exam.  Follows commands appropriately  Extremities: no pedal edema, extremities normal in color    Mental Status: Patient is awake, alert and oriented to person, place, and time.  Affect is normal  Fund of knowledge is intact  Recent and remote memory appear intact  Language function is normal.  There is no evidence of aphasia and conversational speech    Cranial nerves:   -CN II: Visual fields testing shows some left-sided visual loss  -CN III, IV, VI: Gaze midline, conjugate. No gaze preference. EOMI on observation.   -CN V: Facial sensation intact   -CN VII: Left facial droop  -CN VIII: Hearing intact to conversational speech.  -CN IX, X: Normal phonation  -CN XI: Symmetric strength of sternocleidomastoid and trapezius muscles  -CN XII: Tongue protrudes midline.    Motor: Muscle bulk normal. Muscle tone normal. RUE pronator drift. Strength seems to be 5/5 throughout UE and LE With the exception of left lower extremity weakness at 4/5.     Sensory: Light touch intact  Temperature intact  Vibration intact    Reflexes: Toes downgoing on right.  Patient's left great toe is absent. DTRs hyporreflexive throughout UE and LE.     Coordination: FTN intact. No tremors.     Gait: Deferred Due to concern for patient safety due to patient condition    Labs:     Results     Procedure Component Value Units Date/Time    Glucose Whole Blood - POCT [[id number]]  (Abnormal) Collected:  08/13/16 0744     Updated:   08/13/16 0917     POCT - Glucose Whole blood 176 (H) mg/dL     CULTURE BLOOD AEROBIC AND ANAEROBIC [[id number]] Collected:  08/09/16 0439    Specimen:  Arterial Blood Updated:  08/13/16 0854    Narrative:       ORDER#: [id number]                                    ORDERED BY: Gildardo Griffes  SOURCE: Arterial Blood iv                            COLLECTED:  08/09/16 04:39  ANTIBIOTICS AT COLL.:                                RECEIVED :  08/09/16 07:24  Culture Blood Aerobic and Anaerobic        PRELIM      08/13/16 08:21  08/10/16   No Growth after 1 day/s of incubation.  08/11/16   No Growth after 2 day/s of incubation.  08/12/16   No Growth after 3 day/s of incubation.  08/13/16   No Growth after 4 day/s of incubation.      Glucose Whole Blood - POCT [[id number]]  (Abnormal) Collected:  08/13/16 0542     Updated:  08/13/16 0558     POCT - Glucose Whole blood 175 (H) mg/dL     Blood Culture Aerobic/Anaerobic #2 [[id number]] Collected:  08/08/16 1406    Specimen:  Arm from Blood Updated:  08/12/16 1721    Narrative:       ORDER#: [id number]                                    ORDERED BY: Salley Scarlet, KA  SOURCE: Blood arm                                    COLLECTED:  08/08/16 14:06  ANTIBIOTICS AT COLL.:                                RECEIVED :  08/08/16 16:26  Culture Blood Aerobic and Anaerobic        PRELIM      08/12/16 17:21  08/09/16   No Growth after 1 day/s of incubation.  08/10/16   No Growth after 2 day/s of incubation.  08/11/16   No Growth after 3 day/s of incubation.  08/12/16   No Growth after 4 day/s of incubation.      Glucose Whole Blood - POCT [[id number]]  (Abnormal) Collected:  08/12/16 1639     Updated:  08/12/16 1646     POCT - Glucose Whole blood 248 (H) mg/dL     Blood Culture Aerobic/Anaerobic #1 [[id number]] Collected:  08/08/16 1318    Specimen:  Arm from Blood Updated:  08/12/16 1521    Narrative:       ORDER#: [id number]                                    ORDERED BY: Elizbeth Squires  SOURCE:  Blood arm                                    COLLECTED:  08/08/16 13:18  ANTIBIOTICS AT COLL.:                                RECEIVED :  08/08/16 14:39  Culture  Blood Aerobic and Anaerobic        PRELIM      08/12/16 15:21  08/09/16   No Growth after 1 day/s of incubation.  08/10/16   No Growth after 2 day/s of incubation.  08/11/16   No Growth after 3 day/s of incubation.  08/12/16   No Growth after 4 day/s of incubation.            Rads:   Ct Angiogram Head    Result Date: 08/08/2016  1.  Possible very subtle perfusion abnormality about the posterior aspect of left MCA territory. No definite thrombosis or definite penumbra. 2.  Lucency evident involving the right medial occipital cortex and the left cerebellar cortex, consistent with ischemia, age-indeterminate but likely old. 3.  Atherosclerotic stenosis as noted above.  Einar Pheasant, MD 08/08/2016 1:23 PM     Ct Head Wo Contrast    Result Date: 08/08/2016      Lucency of the left cerebellar cortex in the right medial occipital cortex, findings consistent with infarction, age indeterminant but likely old.    Einar Pheasant, MD 08/08/2016 12:49 PM     Ct Head W Contrast    Result Date: 08/08/2016  1.  Possible very subtle perfusion abnormality about the posterior aspect of left MCA territory. No definite thrombosis or definite penumbra. 2.  Lucency evident involving the right medial occipital cortex and the left cerebellar cortex, consistent with ischemia, age-indeterminate but likely old. 3.  Atherosclerotic stenosis as noted above.  Einar Pheasant, MD 08/08/2016 1:23 PM     Ct Angiogram Cerebral Perfusion W 3d Reconstruction    Result Date: 08/08/2016  1.  Possible very subtle perfusion abnormality about the posterior aspect of left MCA territory. No definite thrombosis or definite penumbra. 2.  Lucency evident involving the right medial occipital cortex and the left cerebellar cortex, consistent with ischemia, age-indeterminate but likely old. 3.  Atherosclerotic  stenosis as noted above.  Einar Pheasant, MD 08/08/2016 1:23 PM     Ct Angiogram Neck    Result Date: 08/08/2016  1.  Possible very subtle perfusion abnormality about the posterior aspect of left MCA territory. No definite thrombosis or definite penumbra. 2.  Lucency evident involving the right medial occipital cortex and the left cerebellar cortex, consistent with ischemia, age-indeterminate but likely old. 3.  Atherosclerotic stenosis as noted above.  Einar Pheasant, MD 08/08/2016 1:23 PM     Mri Brain Wo Contrast    Result Date: 08/09/2016   1. Small, acute cortical infarct involving a single left parietal gyrus without mass effect or hemorrhage. 2. Generalized cerebral volume loss and advanced sequela of chronic microvascular change. 3. Chronic left cerebellar infarct. 4. Limited exam. Waynard Edwards, MD 08/09/2016 9:26 PM     US Renal Kidney    Result Date: 08/12/2016   Renal ultrasound shows no significant change in the degree of right hydronephrosis despite adequate positioning of the ureteral stent.         The urinary bladder is decompressed by a Foley catheter.                           Miguel Dibble, MD 08/12/2016 7:58 AM     US Renal Kidney    Result Date: 08/09/2016   Bilateral collecting system dilatation right more than left. Stent present on the right can be followed from the renal pelvis  into the bladder. Bladder trabeculation. Mild increase in renal cortical echogenicity bilaterally. Comment: Findings discussed with the patient's attending physician at the time of dictation. Annabell Sabal, MD 08/09/2016 5:37 PM     Xr Chest  Ap Portable    Result Date: 08/08/2016   Hypoventilatory change, basilar atelectasis. Geanie Cooley, MD 08/08/2016 1:32 PM         Signed by:  Norvel Richards. Clemon Chambers, FNP-BC  Nurse Practitioner  [facility]  Pager: [phone]  Spectralink [phone]  After Hours: [phone]    Please see attending Neurologist note that accompanies this mid-level encounter note.

## 2016-08-13 NOTE — SLP Progress Note (Signed)
[facility]   SLP Treatment Note  Patient: Glenda Green    MRN#: [id number]     Treatment Type: dysphagia     Recommendations/Plan:   - Diet Recommendations: regular solids/thin liquids   - Aspiration Precautions: upright and alert, small bites and sips, slow rate     SLP Frequency Recommended: 3x per week    Discharge recommendations: Home with supervision/assistance, Outpatient speech    Assessment:   Patient presents with functional oropharyngeal swallow. Timely mastication of solids. Upon palpation, swallow appears grossly timely with mildly reduced hyolaryngeal elevation. No overt s/s aspiration. Recommend continue regular solids and thin liquids.     Patient also presents with improved expressive and receptive language skills. Speech is fluent with mild word finding difficulties in conversation. Adequate ability to follow complex directions and answer complex yes/no questions. Appropriate safety/general reasoning. D/c from acute skilled SLP services for both speech and swallow at this time. If patient continues to notice word finding difficulties after returning home to daily life, recommend outpatient speech therapy services.     Subjective:   Pain: denies  Current Diet: reg/thin   Respiratory Status: RA  Precautions: aspiration, fall    RN cleared patient for session. Family at bedside. Patient alert and agreeable to session. Patient denies any aphasia, family reports patient still with some word finding difficulties in conversation.     Patient left with call bell within reach, all needs met, SCDs in place, fall mat in place, bed alarm activated and all questions answered. RN notified of session outcome and patient response.     Objective:   Objective:   Dysphagia: Patient self administered PO from regular lunch tray and thin via straw. Adequate bolus extraction, manipulation, mastication, and a/p transit. Upon palpation, swallow appears grossly timely with mildly reduced hyolaryngeal  elevation. No overt s/s aspiration.     Speech/language:   Oriented x4  Simple yes/no: 3/3  Complex yes/no: 4/4  Automatic speech: numbers 1-10 and days of week in tact  Months of year backwards: slow, but in tact  Convergent naming: 3/3  Divergent naming: named 5 animals in 60 seconds   Safety/general reasoning: 6/7     Patient Education: verbal, pt and family voiced understanding       Goals:   Patient will tolerate diet of regular solids and thin liquids x24-48 hrs without overt s/s aspiration. -MET    Patient will be oriented x2 with mod cues. -MET  Patient will answer complex yes/no questions 80% acc with min cues. -MET  Patient will follow 1 step commands 80% acc with min cues. -MET  Patient will complete automatic speech sequences in unison with SLP 80% acc. -MET  Patient will repeat single words 80% acc with mod cues. -MET  Patient will name objects 65% acc with max cues. -MET        Luther Redo, M.S., CF-SLP   Pager [phone]    Time of Treatment:  Dysphagia:   SLP Received On: 08/13/16  Start Time: 1200  Stop Time: 1211  Time Calculation (min): 11 min    Speech/language:   SLP Received On: 08/13/16  Start Time: 1211  Stop Time: 1222  Time Calculation (min): 11 min

## 2016-08-13 NOTE — Plan of Care (Signed)
Pt A & OX4, follows command, moves all extremity,uses walker. On tele (NS), RA. CT chest and CT abdomen done. Plavix on hold for LHC. On heparin drip @ 12 unit/kg/hr.APTT drawn around 1730 ; APTT= 31 .on folley cath, Fall precautions in place. Will continue to monitor.

## 2016-08-14 LAB — FERRITIN: Ferritin: 248.34 ng/mL — ABNORMAL HIGH (ref 4.60–204.00)

## 2016-08-14 LAB — CBC
Absolute NRBC: 0 10*3/uL
Hematocrit: 37.8 % (ref 37.0–47.0)
Hgb: 12 g/dL (ref 12.0–16.0)
MCH: 27.3 pg — ABNORMAL LOW (ref 28.0–32.0)
MCHC: 31.7 g/dL — ABNORMAL LOW (ref 32.0–36.0)
MCV: 86.1 fL (ref 80.0–100.0)
MPV: 10.2 fL (ref 9.4–12.3)
Nucleated RBC: 0 /100 WBC (ref 0.0–1.0)
Platelets: 333 10*3/uL (ref 140–400)
RBC: 4.39 10*6/uL (ref 4.20–5.40)
RDW: 15 % (ref 12–15)
WBC: 7.43 10*3/uL (ref 3.50–10.80)

## 2016-08-14 LAB — BASIC METABOLIC PANEL
BUN: 28 mg/dL — ABNORMAL HIGH (ref 7.0–19.0)
CO2: 22 mEq/L (ref 22–29)
Calcium: 8.6 mg/dL (ref 7.9–10.2)
Chloride: 105 mEq/L (ref 100–111)
Creatinine: 1.8 mg/dL — ABNORMAL HIGH (ref 0.6–1.0)
Glucose: 414 mg/dL — ABNORMAL HIGH (ref 70–100)
Potassium: 4.7 mEq/L (ref 3.5–5.1)
Sodium: 135 mEq/L — ABNORMAL LOW (ref 136–145)

## 2016-08-14 LAB — APTT
PTT: 67 s — ABNORMAL HIGH (ref 23–37)
PTT: 59 s — ABNORMAL HIGH (ref 23–37)

## 2016-08-14 LAB — PROTEIN / CREATININE RATIO, URINE
Urine Creatinine, Random: 38.4 mg/dL
Urine Protein Random: 187.3 mg/dL — ABNORMAL HIGH (ref 1.0–14.0)
Urine Protein/Creatinine Ratio: 4.9

## 2016-08-14 LAB — MAGNESIUM: Magnesium: 1.8 mg/dL (ref 1.6–2.6)

## 2016-08-14 LAB — GLUCOSE WHOLE BLOOD - POCT
Whole Blood Glucose POCT: 181 mg/dL — ABNORMAL HIGH (ref 70–100)
Whole Blood Glucose POCT: 411 mg/dL — ABNORMAL HIGH (ref 70–100)
Whole Blood Glucose POCT: 289 mg/dL — ABNORMAL HIGH (ref 70–100)
Whole Blood Glucose POCT: 414 mg/dL — ABNORMAL HIGH (ref 70–100)

## 2016-08-14 LAB — IRON PROFILE
Iron Saturation: 24 % (ref 15–50)
Iron: 52 ug/dL (ref 40–145)
TIBC: 220 ug/dL — ABNORMAL LOW (ref 265–497)
UIBC: 168 ug/dL (ref 126–382)

## 2016-08-14 LAB — GFR: EGFR: 27.2

## 2016-08-14 LAB — HEMOLYSIS INDEX
Hemolysis Index: 13 (ref 0–18)
Hemolysis Index: 17 (ref 0–18)

## 2016-08-14 LAB — PHOSPHORUS: Phosphorus: 4.1 mg/dL (ref 2.3–4.7)

## 2016-08-14 MED ORDER — INSULIN GLARGINE 100 UNIT/ML SC SOLN
8.0000 [IU] | Freq: Every evening | SUBCUTANEOUS | Status: DC
Start: 2016-08-14 — End: 2016-08-21
  Administered 2016-08-14 – 2016-08-20 (×7): 8 [IU] via SUBCUTANEOUS
  Filled 2016-08-14: qty 1
  Filled 2016-08-14: qty 8
  Filled 2016-08-14 (×5): qty 1

## 2016-08-14 MED ORDER — INSULIN LISPRO 100 UNIT/ML SC SOLN
2.0000 [IU] | Freq: Three times a day (TID) | SUBCUTANEOUS | Status: DC | PRN
Start: 2016-08-14 — End: 2016-08-21
  Administered 2016-08-15: 2 [IU] via SUBCUTANEOUS
  Administered 2016-08-15: 8 [IU] via SUBCUTANEOUS
  Administered 2016-08-15 – 2016-08-16 (×3): 6 [IU] via SUBCUTANEOUS
  Administered 2016-08-16 – 2016-08-17 (×2): 2 [IU] via SUBCUTANEOUS
  Administered 2016-08-17: 8 [IU] via SUBCUTANEOUS
  Administered 2016-08-18: 10 [IU] via SUBCUTANEOUS
  Administered 2016-08-18 – 2016-08-19 (×2): 4 [IU] via SUBCUTANEOUS
  Administered 2016-08-20: 2 [IU] via SUBCUTANEOUS
  Administered 2016-08-20: 10 [IU] via SUBCUTANEOUS
  Filled 2016-08-14 (×3): qty 3

## 2016-08-14 MED ORDER — TRAMADOL HCL 50 MG PO TABS
50.0000 mg | ORAL_TABLET | Freq: Four times a day (QID) | ORAL | Status: DC | PRN
Start: 2016-08-14 — End: 2016-08-21
  Administered 2016-08-14: 50 mg via ORAL
  Filled 2016-08-14: qty 1

## 2016-08-14 MED ORDER — INSULIN LISPRO 100 UNIT/ML SC SOLN
6.0000 [IU] | Freq: Once | SUBCUTANEOUS | Status: AC
Start: 2016-08-14 — End: 2016-08-14
  Administered 2016-08-14: 6 [IU] via SUBCUTANEOUS

## 2016-08-14 MED ORDER — INSULIN LISPRO 100 UNIT/ML SC SOLN
1.0000 [IU] | Freq: Every evening | SUBCUTANEOUS | Status: DC | PRN
Start: 2016-08-14 — End: 2016-08-21
  Administered 2016-08-14: 6 [IU] via SUBCUTANEOUS
  Administered 2016-08-15: 4 [IU] via SUBCUTANEOUS
  Administered 2016-08-16: 5 [IU] via SUBCUTANEOUS
  Administered 2016-08-17 – 2016-08-18 (×2): 4 [IU] via SUBCUTANEOUS
  Administered 2016-08-19: 2 [IU] via SUBCUTANEOUS
  Administered 2016-08-20: 5 [IU] via SUBCUTANEOUS
  Filled 2016-08-14 (×2): qty 15
  Filled 2016-08-14: qty 6
  Filled 2016-08-14 (×2): qty 12
  Filled 2016-08-14: qty 3

## 2016-08-14 NOTE — Progress Notes (Signed)
HOSPITALIST PROGRESS NOTE    Date Time: 08/14/16 1:58 PM  Patient Name: Glenda [name]  Attending Physician: Gildardo Griffes, DO        Subjective:   Patient seen and examined. She is very alert and interactive this morning. AAO x 3. No chest pain, SOB, abdominal pain, N/V/D. No fevers or chills. No headache.    RN at bedside - as part of [facility] rounding.    Family updated by me at bedside.     TEE done already - confirms myxoma.     For Cardiac Cath Monday.  For CT surgery next week.    Nephro input noted.      Physical Exam:     Vitals:    08/14/16 1057   BP: 114/59   Pulse: 74   Resp: 16   Temp: (!) 96.7 F (35.9 C)   SpO2: 96%       Intake and Output Summary (Last 24 hours) at Date Time    Intake/Output Summary (Last 24 hours) at 08/14/16 1358  Last data filed at 08/14/16 1001   Gross per 24 hour   Intake              400 ml   Output             1950 ml   Net            -1550 ml       General: awake, alert, oriented x 3; no acute distress, mental status is much improved today  HEENT: perrla, eomi, sclera anicteric  oropharynx clear without lesions, mucous membranes moist  Neck: supple, no lymphadenopathy, no thyromegaly, no JVD, no carotid bruits  Cardiovascular: regular rate and rhythm, ++ systolic murmur, rubs or gallops  Lungs: clear to auscultation bilaterally, without wheezing, rhonchi, or rales  Abdomen: soft, non-tender, non-distended; no palpable masses, no hepatosplenomegaly, normoactive bowel sounds, no rebound or guarding  Extremities: no clubbing, cyanosis, or edema  Neuro: cranial nerves grossly intact, moves all extremities, RUE pronator drift, R Hemineglect -- neuro exam improving  Skin: no rashes or lesions noted      Medications:     Current Facility-Administered Medications   Medication Dose Route Frequency   . amLODIPine  10 mg Oral Daily   . atorvastatin  40 mg Oral QHS   . metoprolol XL  100 mg Oral Daily   . sodium bicarbonate  1,300 mg Oral QID         Labs:     Results     Procedure  Component Value Units Date/Time    APTT [[id number]]  (Abnormal) Collected:  08/14/16 1224     Updated:  08/14/16 1331     PTT 59 (H) sec      APTT Anticoag. Given w/i 48 hrs. heparin    Protein / creatinine ratio, urine [[id number]] Collected:  08/14/16 1109    Specimen:  Urine Updated:  08/14/16 1324    Narrative:       Per low intensity heparin protocol.    GFR [[id number]] Collected:  08/14/16 1225     Updated:  08/14/16 1316     EGFR 27.2    Narrative:       Per low intensity heparin protocol.    Basic Metabolic Panel [[id number]]  (Abnormal) Collected:  08/14/16 1225    Specimen:  Blood Updated:  08/14/16 1316     Glucose 414 (H) mg/dL      BUN 62.9 (H) mg/dL  Creatinine 1.8 (H) mg/dL      Calcium 8.6 mg/dL      Sodium 161 (L) mEq/L      Potassium 4.7 mEq/L      Chloride 105 mEq/L      CO2 22 mEq/L     Narrative:       Per low intensity heparin protocol.    Magnesium [[id number]] Collected:  08/14/16 1225    Specimen:  Blood Updated:  08/14/16 1316     Magnesium 1.8 mg/dL     Narrative:       Per low intensity heparin protocol.    Phosphorus [[id number]] Collected:  08/14/16 1225    Specimen:  Blood Updated:  08/14/16 1316     Phosphorus 4.1 mg/dL     Narrative:       Per low intensity heparin protocol.    CBC [[id number]]  (Abnormal) Collected:  08/14/16 1225    Specimen:  Blood from Blood Updated:  08/14/16 1301     WBC 7.43 x10 3/uL      Hgb 12.0 g/dL      Hematocrit 57.8 %      Platelets 333 x10 3/uL      RBC 4.39 x10 6/uL      MCV 86.1 fL      MCH 27.3 (L) pg      MCHC 31.7 (L) g/dL      RDW 15 %      MPV 10.2 fL      Nucleated RBC 0.0 /100 WBC      Absolute NRBC 0.00 x10 3/uL     Narrative:       Per low intensity heparin protocol.    Glucose Whole Blood - POCT [[id number]]  (Abnormal) Collected:  08/14/16 1102     Updated:  08/14/16 1119     POCT - Glucose Whole blood 411 (H) mg/dL     CULTURE BLOOD AEROBIC AND ANAEROBIC [[id number]] Collected:  08/09/16 0439    Specimen:  Arterial Blood Updated:  08/14/16  1021    Narrative:       ORDER#: [id number]                                    ORDERED BY: Gildardo Griffes  SOURCE: Arterial Blood iv                            COLLECTED:  08/09/16 04:39  ANTIBIOTICS AT COLL.:                                RECEIVED :  08/09/16 07:24  Culture Blood Aerobic and Anaerobic        FINAL       08/14/16 10:21  08/14/16   No growth after 5 days of incubation.      Glucose Whole Blood - POCT [[id number]]  (Abnormal) Collected:  08/14/16 0719     Updated:  08/14/16 0748     POCT - Glucose Whole blood 181 (H) mg/dL     Ferritin [[id number]]  (Abnormal) Collected:  08/14/16 0015    Specimen:  Blood Updated:  08/14/16 0246     Ferritin 248.34 (H) ng/mL     IRON PROFILE [[id number]]  (Abnormal) Collected:  08/14/16 6063     Updated:  08/14/16 0160  Iron 52 ug/dL      UIBC 034 ug/dL      TIBC 742 (L) ug/dL      Iron Saturation 24 %     Hemolysis index [[id number]] Collected:  08/14/16 0015     Updated:  08/14/16 0226     Hemolysis Index 13    Hemolysis index [[id number]] Collected:  08/14/16 0015     Updated:  08/14/16 0217     Hemolysis Index 17    APTT [[id number]]  (Abnormal) Collected:  08/14/16 0015     Updated:  08/14/16 0100     PTT 67 (H) sec      APTT Anticoag. Given w/i 48 hrs. heparin    Glucose Whole Blood - POCT [[id number]]  (Abnormal) Collected:  08/13/16 2111     Updated:  08/13/16 2121     POCT - Glucose Whole blood 306 (H) mg/dL     Blood Culture Aerobic/Anaerobic #2 [[id number]] Collected:  08/08/16 1406    Specimen:  Arm from Blood Updated:  08/13/16 1921    Narrative:       ORDER#: [id number]                                    ORDERED BY: Elizbeth Squires  SOURCE: Blood arm                                    COLLECTED:  08/08/16 14:06  ANTIBIOTICS AT COLL.:                                RECEIVED :  08/08/16 16:26  Culture Blood Aerobic and Anaerobic        FINAL       08/13/16 19:21  08/13/16   No growth after 5 days of incubation.      Protime-INR [[id number]]  (Abnormal) Collected:   08/13/16 1724    Specimen:  Blood Updated:  08/13/16 1805     PT 11.8 (L) sec      PT INR 0.9     PT Anticoag. Given Within 48 hrs. None    APTT [[id number]] Collected:  08/13/16 1724     Updated:  08/13/16 1759     PTT 31 sec     CBC [[id number]]  (Abnormal) Collected:  08/13/16 1724    Specimen:  Blood from Blood Updated:  08/13/16 1757     WBC 7.68 x10 3/uL      Hgb 11.7 (L) g/dL      Hematocrit 35.0 (L) %      Platelets 343 x10 3/uL      RBC 4.25 x10 6/uL      MCV 86.1 fL      MCH 27.5 (L) pg      MCHC 32.0 g/dL      RDW 15 %      MPV 10.0 fL      Nucleated RBC 0.0 /100 WBC      Absolute NRBC 0.00 x10 3/uL     Blood Culture Aerobic/Anaerobic #1 [[id number]] Collected:  08/08/16 1318    Specimen:  Arm from Blood Updated:  08/13/16 1721    Narrative:       ORDER#: [id number]  ORDERED BY: [place], KA  SOURCE: Blood arm                                    COLLECTED:  08/08/16 13:18  ANTIBIOTICS AT COLL.:                                RECEIVED :  08/08/16 14:39  Culture Blood Aerobic and Anaerobic        FINAL       08/13/16 17:21  08/13/16   No growth after 5 days of incubation.      Glucose Whole Blood - POCT [[id number]]  (Abnormal) Collected:  08/13/16 1618     Updated:  08/13/16 1700     POCT - Glucose Whole blood 253 (H) mg/dL     MRSA culture [[id number]] Collected:  08/13/16 1156    Specimen:  Body Fluid from Nares Updated:  08/13/16 1458            Radiology:     Radiology Results (24 Hour)     Procedure Component Value Units Date/Time    CT Abdomen Pelvis W PO Contrast Only [[id number]] Collected:  08/13/16 1910    Order Status:  Completed Updated:  08/13/16 1924    Narrative:       The following dose reduction techniques were utilized: automated  exposure control and/or adjustment of the mA and/or kV according to  patient size, and/or the use iterative reconstruction technique.    Clinical indications: Evaluate for metastatic disease. Musculoskeletal  tumor.    TECHNIQUE: CT  of the abdomen and pelvis without contrast. Oral contrast  was given.    FINDINGS: There is mild atelectasis at the lung bases. Irregular opacity  seen in the right lung base. Mild bronchiectasis in the right lung base.    The liver, spleen, adrenal glands and pancreas are unremarkable on this  noncontrast study. Calcified gallstones are present.    There is moderate bilateral hydronephrosis. Right ureteral stent is in  place. Right lower pole renal calculus is present. Fatty lesion medial  to the lower pole the left kidney contains calcification and internal  stranding and measures 6.6 x 3.8 cm. Mass abuts the left kidney.    There is no bowel obstruction. Colonic diverticulosis without  diverticulitis. Normal appendix. The aorta is normal in caliber. There  is no adenopathy or ascites.    Bladder partially distended. Foley catheter in the bladder. Degenerative  changes are present in the lumbar spine. There are compression fractures  of the lumbar spine.      Impression:         1. No evidence of metastatic disease on this noncontrast study.  2. Bilateral hydronephrosis. Right ureteral stent in place.  3. Fatty lesion in the inferior to the left kidney of uncertain  etiology. Findings may be due to fat necrosis however liposarcoma is not  excluded.  4. Other findings as above.    Jasmine December  D'Heureux, MD   08/13/2016 7:20 PM      CT Chest WO Contrast [[id number]] Collected:  08/13/16 1857    Order Status:  Completed Updated:  08/13/16 1914    Narrative:       The following dose reduction techniques were utilized: automated  exposure control and/or adjustment of the mA and/or kV according to  patient size, and/or the  use iterative reconstruction technique.    Clinical indication: Metastasis. MSK neoplasm. Evaluate for lung  metastasis.    TECHNIQUE: CT of the chest without contrast.    FINDINGS: There is no axillary, mediastinal or hilar adenopathy. No  pleural pericardial effusion. The heart is normal in size.  Coronary  vascular calcifications are present. Calcifications in the region of the  aortic valve. There is no thoracic aortic aneurysm. There is fat within  the interatrial septum likely due to lipomatous hypertrophy of the  interatrial septum. The esophagus is not dilated. Large hiatal hernia is  present.    Calcified gallstones are present. The adrenal glands are normal. Colonic  diverticulosis without diverticulitis. Mild bilateral hydronephrosis.  Otherwise the visualized portions of the upper abdomen are unremarkable.    There is mild atelectasis at the right lung base. Mild bronchiectasis  right lower lobe. 0.9 x 0.9 cm irregular opacity is present at the right  lung base medially. No other pulmonary nodules. Central airways are  patent. Degenerative changes are present in the thoracic spine. There is  [name] sclerotic lesion in the left T4 pedicle.      Impression:         1. Irregular opacity at the right lung base medially. Findings may be  due to scarring however metastatic disease is not excluded. Follow-up in  the prior studies are available then comparison to prior studies is  recommended..  2. Sclerotic lesion left T4 pedicle. This is indeterminate. Metastatic  disease is not excluded. Other findings as above.    Jasmine December  D'Heureux, MD   08/13/2016 7:10 PM              Assessment:     Patient Active Problem List   Diagnosis   . Hydronephrosis of right kidney   . CVA (cerebral vascular accident)   . Atrial myxoma   . Bilateral hydronephrosis   . UTI (urinary tract infection)   . AKI (acute kidney injury)         Plan:   # Left MCA Syndrome with ACUTE CVA  - Continue IV Heparin gtt (without Bolus) - as instructed by Neurology  - For cardiac cath Monday and surgery next week  - Continue Lipitor 40mg   - TEE confirms: Large RA mass taking up most or FRA cavity but not obstructing or prolapsing across the TV orifice. Size: at least 5.5 cm in height and 3.5 cm in width.  - Continue on cardiac monitor  - Noted  MRI/MRA results    # R Atrial Myxoma  - Noted on Echo and confirmed on TEE  - TEE confirms: Large RA mass taking up most or FRA cavity but not obstructing or prolapsing across the TV orifice. Size: at least 5.5 cm in height and 3.5 cm in width.  - Appreciate Cardiology recommendations  - CT Chest/Abdomen/Pelvis noted  - CT Surgery - for OR next seek    # AKI  - Renal function worse today - repeat Renal US shows decompressed bladder (s/p Foley)  - Keep Foley in for now  - F/U Urology recommendations - THINK UROLOGY intervention may be needed  - Appreciate Nephrology input    # Bilateral Hydronephrosis   - Continue Foley as per discussion with urology    # UTI  - Continue IV Ceftriaxone for now -- will switch to PO prior to discharge  - Noted urine cultures    # HTN  - Continue Metoprolol 100mg    - Continue  Amlodipine 10mg   - Needs good and aggressive BP control -- well controlled on the new regimen  - Continue to monitor BP    # DM2  - Continue SSI      DVT Prophylaxis: SQ Heparin  Diet: Cardiac Diet  Anticipated discharge disposition and date:  Next week    Signed by: Gildardo Griffes, D.O.   cc:Pcp, Noneorunknown, MD

## 2016-08-14 NOTE — OT Progress Note (Addendum)
[facility]   Occupational Therapy Treatment     Patient: Glenda [name]    MRN#: [id number]   Unit: [facility] TOWER 6 EAST  Bed: F619/F619.01      Discharge Recommendations:   Discharge Recommendation: SNF-  (pending OR next week for mass removal-will need reassessment)  DME Recommended for Discharge:  (in place: SC, Rollator)    If SNF recommended discharge disposition is not available, patient will need hands on assist for ADLs and functional mobility, BSC, in place equipment, and HHOT.     Assessment:   Pt wanting to complete grooming task- cue to wait for therapist to organize environment for optimal safety. Pt sits EOB SBA with HOB elevated, cues to scoot R hip forward for full alignment before stand, CGA with RW to bathroom and toilet t/f- clothing management max A. Pt completes toothbrushing standing sinkside with SBA-utilizes both extremities for task and identifies items on L side of visual field. Decreased safety returning to bed 2/2 increased fatigue, increased cues for safe use of walker. Pt to benefit from continued OT services to address functional deficits and maximize pt safety and independence.    Treatment Activities: ADL retraining, functional t/f training, activity tolerance, safety trianing    Educated the patient to role of occupational therapy, plan of care, goals of therapy and safety with mobility and ADLs.    Plan:    OT Frequency Recommended: 2-3x/wk     Continue plan of care.       Precautions and Contraindications:   Falls      Updated Medical Status/Imaging/Labs:    Planned surgery Tues or Wed next week    9/21: CT Abdomen Pelvis  IMPRESSION:     1. No evidence of metastatic disease on this noncontrast study.  2. Bilateral hydronephrosis. Right ureteral stent in place.  3. Fatty lesion in the inferior to the left kidney of uncertain  etiology. Findings may be due to fat necrosis however liposarcoma is not  excluded.  4. Other findings as  above.      9/21: CT Chest  IMPRESSION:     1. Irregular opacity at the right lung base medially. Findings may be  due to scarring however metastatic disease is not excluded. Follow-up in  the prior studies are available then comparison to prior studies is  recommended..  2. Sclerotic lesion left T4 pedicle. This is indeterminate. Metastatic  disease is not excluded. Other findings as above.    Subjective: "My only complaint is I'm always tired."   Patient's medical condition is appropriate for Occupational Therapy intervention at this time.  Patient is agreeable to participation in the therapy session.    Pain:   Scale: Denies pain or headache.    Objective:   Patient is in bed with telemetry and peripheral IV in place.      Cognition:    Alert , Oriented  Safety Awareness: impaired safe use of RW, cues for sequencing  Behavior: pleasant  Following Commands: appropriate with cueing  Motor Planning: cues for sequencing with use of walker  Problem Solving: delayed    Vision:  Able to identify self care times on L side without cueing    Functional Mobility  Rolling:  CGA  Supine to Sit: CGA, cueing  Sit to Stand: CGA to RW  Transfers: CGA with RW    Balance  Static Sitting: good  Dynamic Sitting: good-  Static Standing: good with RW  Dynamic Standing: good- with  RW    Self Care and Home Management  Grooming: SBA standing sinkside  Toileting: CGA t/f, max A clothing management    Therapeutic Exercises  With activity    Participation: good  Endurance: fair    Patient left with call bell within reach, all needs met, SCDs on, fall mat in place, bed alarm on, chair alarm n/a and all questions answered. RN notified of session outcome and patient response.     Goals:  Time For Goal Achievement: 5 visits  ADL Goals  Patient will dress upper body: Independent, 5 visits, without AE  Patient will dress lower body: Independent, 5 visits, without AE  Pt will complete bathing: Modified Independent, 5 visits  Mobility and Transfer  Goals  Pt will perform functional transfers: Modified Independent, 5 visits, with rolling walker  Neuro Re-Ed Goals  Pt will perform dynamic sitting balance: Independent, 5 visits, to increase ability to complete ADLs  Pt will perform dynamic standing balance: Modified Independent, 5 visits, to complete standing ADLs safely, for 10 minutes  Musculoskeletal Goals  Pt will demonstrate increase of strength: to 4+/5, to increase ability to complete ADLs, 5 visits  Other Goal: increase activity tolerance to good  Executive Fucntion Goals  Pt will demonstrate safety: independent, 5 visits, to increase ability to complete ADLs, instituting safety techniques                Genia Del, OTR/L  [phone]      Time of Treatment  OT Received On: 08/14/16  Start Time: 1415  Stop Time: 1445  Time Calculation (min): 30 min    Treatment # 2 of 5 visits

## 2016-08-14 NOTE — Progress Notes (Signed)
Spoke with daughter in law to complete assessment.  Lives in [place], [place] with son an DIL.  Will [place] home with local son.  They will get patient to [facility] when ready to travel.  Eligha Bridegroom, RN CCM  Clinical Case Manager  [facility]  [phone]

## 2016-08-14 NOTE — Plan of Care (Signed)
Problem: Every Day - Stroke  Goal: Neurological status is stable or improving  Outcome: Progressing   08/14/16 0520   Goal/Interventions addressed this shift   Neurological status is stable or improving Monitor/assess/document neurological assessment (Stroke: every 4 hours)       Comments: Heparin infusing at 12 u/k/hr, Next APTT at 12:15, Unable to place PIV to RUE by Clin Techs, A&OX4, FC, No c/o pain, O2 RA, PO tolerated, Will continue to monitor for needs and safety, Falls precautions in use.

## 2016-08-14 NOTE — PT Progress Note (Signed)
Physical Therapy Cancellation Note    Patient: Glenda [name]  NFA:[id number]    Unit: Q469/G295.28    Patient not seen for physical therapy secondary to pt sleeping upon entry, daughter requests to allow pt to sleep and return at a later time. Will attempt again as schedule permits.     Renato Gails, PT,DPT pager [phone] 08/14/2016 11:54 AM

## 2016-08-14 NOTE — Consults (Signed)
WOCN Note    Consulted for LUE skin tear.    Assessment: Foam dressing peeled back to reveal skin tear with most of skin flap present.     Recommendation: Place nonadherent on skin tear then foam dressing so epidermis is not pulled off.    Plan: WOCN will continue to follow weekly.    Sherri Sear RN CCRN SCRN CWCN  [facility] WOCN Team  [place] [id number]  Office [phone]

## 2016-08-14 NOTE — Procedures (Signed)
POWER MIDLINE INSERTION PROCEDURE     [name],Glenda A  08/14/2016    INDICATIONS: Therapy less than 28 days       PROCEDURE DETAILS:   The patient was positioned and Ultrasound was used to confirm patency of the Right Basilic vein prior to obtaining venous access. The arm was scrubbed with 2% chlorhexidine per guidelines and a maximal sterile field was established for the patient.  The clinician was attired with cap, mask and sterile gown/gloves prior to start.     4 french -Power Poly Midline:  A sterile cover was sheathed to the Ultrasound probe.  The vein was then revisualized and 1% lidocaine injected prior to puncture of the Right Basilic vein with a 21-gauge single-wall needle under direct sonographic guidance.  The guidewire was advanced through the needle, the needle was removed and a peel-away sheath was placed over the wire.  After measuring from the insertion site to the midline of the upper arm the catheter was trimmed to insure tip location below the axillary.  The catheter was then advanced through the peel-away sheath.  The sheath was removed.  The catheter was flushed with normal saline to confirm brisk blood return and capped.  The catheter was stabilized on the skin using a securement device.  Antimicrobial disk and sterile transparent occlusive dressing applied using aseptic technique.      Patient did tolerate the procedure well.   Catheter Type: 4Fr  power Poly Midline  Insertion Site: Right Basilic vein  Total length: 15 cm  Internal Length:  15 cm  External Length: 0 cm  UAC: 0 cm         Findings/Conclusions:  No signs of bleeding or symptoms of nerve irritation noted at time of insertion procedure.    Tip location is below the level of the axilla in Basilic vein. Midline is ready for immediate use.    Midline is ready for immediate use  Narda Amber, RN

## 2016-08-14 NOTE — PT Progress Note (Signed)
Physical Therapy Note    [facility]   Physical Therapy Treatment  Patient:  Glenda [name] MRN#:  [id number]  Unit: [facility] TOWER 6 EAST  Bed: F619/F619.01    Discharge Recommendations:   D/C Recommendations: SNF   DME Recommendations:  (In place)     If SNF recommended discharge disposition is not available, patient will need hands on assist for ADLs and functional mobility, BSC, in place equipment, and HHPT.         Assessment:   Pt presents with significantly improved strength, decreased visual field deficits, improved memory and improved functional mobility demonstrating Good- sitting balance, no LOB or leaning. Pt continues to require SBA-CGA for ambulation with RW demonstrating mild standing balance deficits initially however with improved ability to negotiate obstacles. Educated pt on decreasing Valsalva maneuver in order to decrease intrathoracic cavity pressure. Performed supine/seated LE strengthening and RUE PROM 2/2 pt c/o R frozen shoulder. Cont with PT POC to progress toward goals.     Assessment: Decreased UE ROM, Decreased UE strength, Decreased LE strength, Decreased safety/judgement during functional mobility, Decreased endurance/activity tolerance, Decreased sensation, Visual deficit, Impaired coordination, Decreased functional mobility, Decreased balance, Gait impairment     Prognosis: Good, With continued PT status post acute discharge  Risks/Benefits/POC Discussed with Pt/Family: With patient/family  Patient left without needs and call bell within reach. RN notified of session outcome.     Treatment Activities: therex, gait, pt education    Educated the patient to role of physical therapy, plan of care, goals of therapy and safety with mobility and ADLs, energy conservation techniques.    Plan:   Treatment/Interventions: Exercise, Gait training, Stair training, Neuromuscular re-education, Functional transfer training, LE strengthening/ROM, Endurance training,  Cognitive reorientation, Patient/family training, Bed mobility, Equipment eval/education        PT Frequency: 2-3x/wk     Continue plan of care.       Precautions and Contraindications:   Precautions  Other Precautions: Falls    Updated Medical Status/Imaging/Labs:   9/21: CT Abdomen Pelvis  IMPRESSION:     1. No evidence of metastatic disease on this noncontrast study.  2. Bilateral hydronephrosis. Right ureteral stent in place.  3. Fatty lesion in the inferior to the left kidney of uncertain  etiology. Findings may be due to fat necrosis however liposarcoma is not  excluded.  4. Other findings as above.      9/21: CT Chest  IMPRESSION:     1. Irregular opacity at the right lung base medially. Findings may be  due to scarring however metastatic disease is not excluded. Follow-up in  the prior studies are available then comparison to prior studies is  recommended..  2. Sclerotic lesion left T4 pedicle. This is indeterminate. Metastatic  disease is not excluded. Other findings as above.  Subjective: "Im tired"        Pain Assessment  Pain Assessment: No/denies pain    Patient's medical condition is appropriate for Physical Therapy intervention at this time.  Patient is agreeable to participation in the therapy session. Nursing clears patient for therapy.    Objective:   Observation of Patient/Vital Signs:  Patient is in bed with telemetry, SCD's and peripheral IV in place.    Cognition/Neuro Status  Arousal/Alertness: Appropriate responses to stimuli  Attention Span: Appears intact  Orientation Level: Oriented X4  Memory: Decreased recall of recent events  Following Commands: Follows all commands and directions without difficulty  Safety Awareness: independent  Insights:  Decreased awareness of deficits;Educated in safety awareness  Problem Solving: Assistance required to identify errors made;Assistance required to generate solutions;Assistance required to implement solutions;supervision  Behavior:  calm;attentive;cooperative  Motor Planning: intact  Coordination: intact  Orientation Level: Oriented X4         Functional Mobility:  Supine to Sit: Supervision  Scooting to EOB: Supervision  Sit to Supine: Supervision  Sit to Stand: Stand by Assist  Stand to Sit: Stand by Assist       Ambulation:  Ambulation: Contact Guard Assist;with front-wheeled walker (CGA- close SBA)  Ambulation Distance (Feet): 15 Feet  Pattern: decreased cadence;decreased step length (mild instability; improved ability to negotiate obstacles)    Therapeutic Exercise  Heelslides: 10  Hip Flexion: 10  Hip Abduction: 10  Knee AROM Short Arc Quad: 10  Ankle Pumps: 10  Shoulder PROM: 10 (RUE ROM, pt c/o frozen shoulder; painful ~95 degrees)     Neuro Re-Ed  Sitting Balance: supervision (pt with improved static/dynamic sitting balance)         Patient Participation: Good  Patient Endurance: Impaired    Patient left with call bell within reach, all needs met, SCDs donned, fall mat in place, bed alarm on,  and all questions answered. RN notified of session outcome and patient response.     Goals:  Goals  Goal Formulation: With patient/family  Time for Goal Acheivement: 5 visits  Goals: Select goal  Pt Will Go Supine To Sit: with supervision, to maximize functional mobility and independence  Pt Will Sit Edge of Bed: 3-5 min, with supervision, to maximize functional mobility and independence (without LOB and neutral sitting balance)  Pt Will Ambulate: 51-100 feet, with rolling walker, with stand by assist, to maximize functional mobility and independence  Pt Will Go Up / Down Stairs: 3-5 stairs, with minimal assist, With rail, to maximize functional mobility and independence      Renato Gails, PT,DPT pager [phone] 08/14/2016 5:01 PM       Time of Treatment  PT Received On: 08/14/16  Start Time: 1520  Stop Time: 1550  Time Calculation (min): 30 min  Treatment # 1 out of 5 visits

## 2016-08-14 NOTE — Consults (Signed)
[facility] [place]  Consultation    Date Time: 08/14/16 10:52 AM  Patient Name: Glenda [name], Glenda [name]  Medical Record Number: [id number]   Primary Care Physician: @[email or web address] @   Requesting Physician: Gildardo Griffes, DO    Reason for Consultation:   CKD  Assessment:   1. Probable CKD3, diabetic, chronic urine retention  2. Bladder outlet obstruction, due to nuerogenic bladder(presumably), right ureteral stenosis, etiology?  4. Left renal mass, needing evaluation per urology  5. DM, many years  6. Hypertension  7. CVA, atrial mass, for cath and surgery  8. H/o urinary incontinence and recurrent UTI's due to #2  9. hyperkalemia  Plan:   Needs urology f/u  Will f/u labs  Need to limit contrast, and suggest N Acetyl cysteine and Iv bicarbonate pre cath  Will quantitate proteinuria  Limit K in diet          We will follow this patient closely with you. Thank you for allowing [place] to participate in the care of this patient.    Sherian Maroon, MD  Office - [phone]  Spectra Link - n/a    History:   Glenda [name] is a 80 y.o. female who presents to the hospital on 08/08/2016 with CVA, recent issue with urologic issues    Past Medical and Surgical  History:     Past Medical History:   Diagnosis Date   . Arthritis    . Coronary artery disease    . Depression    . Difficulty walking     unsteady gait uses walker/cane/wheelchair   . Hyperlipidemia    . Hypertension    . Kidney disorder    . Type 2 diabetes mellitus, controlled    . Urinary tract infection     multiple time on Nitrofurantion maintaining dose     Past Surgical History:   Procedure Laterality Date   . AMPUTATION, TOES, MULTIPLE Left 2014   . CORONARY ARTERY BYPASS  2010   . CYSTOSCOPY, URETERAL STENT INSERTION  08/05/2016    Procedure: CYSTOSCOPY,RIGHT/RETROGRADE, RIGHT/ URETEROSCOPY, STENT PLACEMENT.;  Surgeon: Otelia Santee, MD;  Location: [facility];  Service: Urology;;  CYSTOSCOPY,RIGHT/RETROGRADE, RIGHT/ URETEROSCOPY, STENT  PLACEMENT.   Marland Kitchen FOOT AMPUTATION  2015    x2 three toes   . JOINT REPLACEMENT  2010    left knee     Family History:   History reviewed. No pertinent family history.  Social History:     Social History     Social History   . Marital status: Widowed     Spouse name: N/A   . Number of children: N/A   . Years of education: N/A     Occupational History   . Not on file.     Social History Main Topics   . Smoking status: Never Smoker   . Smokeless tobacco: Never Used   . Alcohol use No   . Drug use: No   . Sexual activity: Not on file     Other Topics Concern   . Not on file     Social History Narrative   . No narrative on file     Allergies:     Allergies   Allergen Reactions   . Tape Itching     Medications:     Current Facility-Administered Medications   Medication Dose Route Frequency   . amLODIPine  10 mg Oral Daily   . atorvastatin  40 mg Oral QHS   . metoprolol XL  100 mg Oral Daily   . sodium bicarbonate  1,300 mg Oral QID     Review of Systems:   12 systems were reviewed and were negative except for the following: weakness, incontinence of urien  Physical Exam:   Temp:  [96.2 F (35.7 C)-99 F (37.2 C)] 96.2 F (35.7 C)  Heart Rate:  [63-81] 72  Resp Rate:  [16-22] 18  BP: (119-159)/(63-78) 119/75    Intake and Output Summary (Last 24 hours) at Date Time    Intake/Output Summary (Last 24 hours) at 08/14/16 1052  Last data filed at 08/14/16 0352   Gross per 24 hour   Intake              900 ml   Output             1225 ml   Net             -325 ml       Gen: Well Developed, Well nourished, No distress  HEENT: Normocephalic, atraumatic  Neck: No JVD  CV: S1 S2, no murmur, no rub  Chest: Good effort, Clear, no rales, no wheezes.  Abdomen: non distended, soft, no organomegaly, has foley  Skin: Dry, no rash  Ext: No edema, no cyanosis  Psych: Appropriate mood and affect      Labs:     Recent Labs  Lab 08/13/16  1325 08/12/16  0330 08/11/16  1331 08/10/16  0400 08/09/16  0440 08/08/16  1318   Glucose 295* 160* 141*  138* 97 136*   BUN 28.0* 25.0* 28.0* 25.0* 23.0* 28.0*   Creatinine 1.6* 1.3* 1.4* 1.7* 1.5* 1.5*   Calcium 8.9 8.4 8.7 8.1 8.4 8.7   Sodium 134* 137 141 138 134* 130*   Potassium 5.3* 3.8 4.6 4.1 4.1 4.8   Chloride 108 111 114* 111 105 99*   CO2 13* 21* 19* 21* 23 22   Albumin  --   --   --   --  3.2* 3.3*   Phosphorus  --  3.5  --  4.0  --   --    Magnesium  --  2.1 1.6 1.4*  --   --        Recent Labs  Lab 08/13/16  1724 08/10/16  0400 08/09/16  0440   WBC 7.68 8.18 10.57   RBC 4.25 3.88* 4.09*   Hgb 11.7* 10.7* 11.3*   Hematocrit 36.6* 33.8* 35.1*   MCV 86.1 87.1 85.8   MCH 27.5* 27.6* 27.6*   MCHC 32.0 31.7* 32.2   RDW 15 15 14    MPV 10.0 9.7 9.6   Platelets 343 281 331     Radiology:   Radiological Procedure reviewed.   EKG:     Prior Records:   I reviewed his old records.

## 2016-08-14 NOTE — Consults (Signed)
[place] HEART PROGRESS NOTE  [facility]      Date Time: 08/14/16 9:52 AM  Patient Name: Glenda [name], Glenda [name]  Medical Record #:  [id number]  Account#:  [id number]  Admission Date:  08/08/2016         Patient Active Problem List   Diagnosis   . Hydronephrosis of right kidney   . CVA (cerebral vascular accident)   . Atrial myxoma   . Bilateral hydronephrosis   . UTI (urinary tract infection)   . AKI (acute kidney injury)       Subjective:   Denies chest pain, SOB or palpitations.  No recurrent neurologic symptoms    LHC postponed, surgery for RA mass removal and possible PFO closure will only take place after 5d plavix washout. LHC planned for Monday.    During washout, as stroke happened on aspirin, neuro elected to place on iv heparin.       Assessment:       Admitted 08/08/16 with aphasia, outside window for lytics, with MRI showing an acute left MCA distribution CVA. Symptoms improving.   RA mass by TTE. Limited image quality. May represent a myxoma. Possible interatrial shunt by TEE. Normal LV function   Moderate AS by TTE 08/08/16   CAD s/p 4V-CABG in 2010 ([place], [place])   HTN labile   Obstructive uropathy recent ureteral stenting 07/2016, creat falling from 1.3-1.7, 1.6 today   Prior renal artery stenosis w stenting   HLD   DM suboptimally controlled a1c 7.7   OA    Recommendations:    CT surgery after 5d washout, I.e.Tues or Wed next week   [facility] Monday.    Informed consent on chart,    pls make npo p MN Sunday   hydrate w NS Sun-Monday overnight as per their instructions pre-cath, follow creat daily    Will see at LHC Monday. Please call us over weekend with any new concerns.  Medications:      Scheduled Meds:        amLODIPine 10 mg Oral Daily   atorvastatin 40 mg Oral QHS   metoprolol XL 100 mg Oral Daily   sodium bicarbonate 1,300 mg Oral QID       Continuous Infusions:  . heparin 25,000 units in dextrose 5% 500 mL 12 Units/kg/hr (08/13/16 1809)            Physical Exam:       VITAL SIGNS  PHYSICAL EXAM   Vitals:    08/14/16 0712   BP: 119/75   Pulse: 72   Resp: 18   Temp: (!) 96.2 F (35.7 C)   SpO2: 95%     Temp (24hrs), Avg:97.3 F (36.3 C), Min:96.2 F (35.7 C), Max:99 F (37.2 C)      Telemetry: Reviewed SR    Intake/Output Summary (Last 24 hours) at 08/14/16 0952  Last data filed at 08/14/16 0352   Gross per 24 hour   Intake              900 ml   Output             18 75 ml   Net             -975 ml    Physical Exam  General: awake, alert, breathing comfortably, no acute distress  Head: normocephalic  Eyes: EOM's intact  Cardiovascular: regular rate and rhythm, normal S1, S2, no S3, no S4,2/6 SEM murmurs, no rubs or gallops  Neck: no carotid bruits or  JVD  Lungs: clear to auscultation bilateraly, without wheezing, rhonchi, or rales  Abdomen: soft, non-tender, non-distended; no palpable masses,  normoactive bowel sounds  Extremities: no edema  Pulse: equal pulses, 4/4 symmetric  Neurological: mild R UE weakness, fluid speech, no facial droop,   Musculoskeletal: normal strength and tone         Labs:             Recent Labs  Lab 08/09/16  0440   Cholesterol 164   Triglycerides 136   HDL 32*   LDL Calculated 105*       Recent Labs  Lab 08/09/16  0440   Bilirubin, Total 0.7   Protein, Total 6.2   Albumin 3.2*   ALT 10   AST (SGOT) 14       Recent Labs  Lab 08/12/16  0330   Magnesium 2.1       Recent Labs  Lab 08/14/16  0015 08/13/16  1724   PT  --  11.8*   PT INR  --  0.9   PTT 67* 31       Recent Labs  Lab 08/13/16  1724 08/10/16  0400 08/09/16  0440   WBC 7.68 8.18 10.57   Hgb 11.7* 10.7* 11.3*   Hematocrit 36.6* 33.8* 35.1*   Platelets 343 281 331       Recent Labs  Lab 08/13/16  1325 08/12/16  0330 08/11/16  1331   Sodium 134* 137 141   Potassium 5.3* 3.8 4.6   Chloride 108 111 114*   CO2 13* 21* 19*   BUN 28.0* 25.0* 28.0*   Creatinine 1.6* 1.3* 1.4*   EGFR 31.2 39.6 36.3   Glucose 295* 160* 141*   Calcium 8.9 8.4 8.7           Invalid input(s): FREET4    .No results found for: BNP      Weight Monitoring 07/25/2016 08/05/2016 08/08/2016 08/08/2016   Height 162.6 cm 162.6 cm - 165.1 cm   Height Method Stated - - Estimated   Weight 65.772 kg 65.772 kg 65 kg 65.772 kg   Weight Method Stated - - Estimated   BMI (calculated) 24.9 kg/m2 24.9 kg/m2 - 24.2 kg/m2       Imaging:       ____________________________________________    Signed by: Brett Fairy, MD      [place] [facility]  NP Spectralink [phone] (8am-5pm)  MD Spectralink [phone] or [phone] (8am-5pm)  Arrhythmia Spectralink [phone] (8am-4:30pm)  After hours, non urgent consult line [phone]  After Hours, urgent consults [phone]

## 2016-08-14 NOTE — Plan of Care (Addendum)
Problem: Every Day - Stroke  Goal: Stable vital signs and fluid balance  Outcome: Progressing   08/14/16 1409   Goal/Interventions addressed this shift   Stable vital signs and fluid balance  Position patient for maximum circulation/cardiac output;Monitor and assess vitals every 4 hours or as ordered and hemodynamic parameters;Monitor intake and output. Notify LIP if urine output is < 30 mL/hour.;Apply telemetry monitor as ordered     VSS throughout shift. PTT 59 at 1224 today. Will continue 12 u/kg/hr and draw next PTT at 0024 08/15/16. Strict I/O, fluid restrictions. Midline placed today in RUE for heparin and IV fluid therapy. Pt appeared tired after working with PT and OT today, but rested comfortably. Acucheck ACHS. Coverage given for each meal. Extra coverage given at 1145 for BS of 411. Pt stable at that time. Dr Lucianne Muss notified. Will continue to monitor.     Problem: Compromised Tissue integrity  Goal: Damaged tissue is healing and protected  Outcome: Progressing   08/14/16 1409   Goal/Interventions addressed this shift   Damaged tissue is healing and protected  Monitor/assess Braden scale every shift;Reposition patient every 2 hours and as needed unless able to reposition self;Increase activity as tolerated/progressive mobility;Avoid shearing injuries;Keep intact skin clean and dry;Use bath wipes, not soap and water, for daily bathing;Use incontinence wipes for cleaning urine, stool and caustic drainage. Foley care as needed;Monitor external devices/tubes for correct placement to prevent pressure, friction and shearing;Encourage use of lotion/moisturizer on skin;Monitor patient's hygiene practices;Consult/collaborate with wound care nurse     Pt has scattered bruising on BUE. Incontinence care provided for bowel incontinence and foley care provided. Pt has small skin tear on RUE cleansed with saline solution and covered with foam dressing. Wound consult requested. Will continue to monitor.

## 2016-08-15 LAB — APTT
PTT: 43 s — ABNORMAL HIGH (ref 23–37)
PTT: 79 s — ABNORMAL HIGH (ref 23–37)
PTT: 64 s — ABNORMAL HIGH (ref 23–37)

## 2016-08-15 LAB — GLUCOSE WHOLE BLOOD - POCT
Whole Blood Glucose POCT: 173 mg/dL — ABNORMAL HIGH (ref 70–100)
Whole Blood Glucose POCT: 282 mg/dL — ABNORMAL HIGH (ref 70–100)
Whole Blood Glucose POCT: 331 mg/dL — ABNORMAL HIGH (ref 70–100)
Whole Blood Glucose POCT: 295 mg/dL — ABNORMAL HIGH (ref 70–100)

## 2016-08-15 LAB — BASIC METABOLIC PANEL
BUN: 26 mg/dL — ABNORMAL HIGH (ref 7.0–19.0)
CO2: 24 mEq/L (ref 22–29)
Calcium: 8.5 mg/dL (ref 7.9–10.2)
Chloride: 104 mEq/L (ref 100–111)
Creatinine: 1.5 mg/dL — ABNORMAL HIGH (ref 0.6–1.0)
Glucose: 313 mg/dL — ABNORMAL HIGH (ref 70–100)
Potassium: 4.3 mEq/L (ref 3.5–5.1)
Sodium: 137 mEq/L (ref 136–145)

## 2016-08-15 LAB — CBC
Absolute NRBC: 0 10*3/uL
Hematocrit: 36 % — ABNORMAL LOW (ref 37.0–47.0)
Hgb: 11.4 g/dL — ABNORMAL LOW (ref 12.0–16.0)
MCH: 27.6 pg — ABNORMAL LOW (ref 28.0–32.0)
MCHC: 31.7 g/dL — ABNORMAL LOW (ref 32.0–36.0)
MCV: 87.2 fL (ref 80.0–100.0)
MPV: 10.1 fL (ref 9.4–12.3)
Nucleated RBC: 0 /100 WBC (ref 0.0–1.0)
Platelets: 379 10*3/uL (ref 140–400)
RBC: 4.13 10*6/uL — ABNORMAL LOW (ref 4.20–5.40)
RDW: 15 % (ref 12–15)
WBC: 8.35 10*3/uL (ref 3.50–10.80)

## 2016-08-15 LAB — GFR: EGFR: 33.6

## 2016-08-15 NOTE — Progress Notes (Addendum)
[facility] Consult Progress Note    Date Time: 08/15/16   Patient Name: Glenda Green  Outpatient Neurologist: n/Green    CC: difficulty speaking    Assessment:   80 yo F visiting from [place] with h/o DM, HTN, HLD, on ASA 81mg , recurrent UTIs s/p renal stent 08/05/16 (was off ASA for it), found aphasic on AM of 08/08/16. Partial L MCA syndrome on arrival to [facility], with NIHSS 10. Was out of window for IV-tPA. No LVO on CTA.     1. Acute ischemic stroke - small acute infarct in L parietal cortex (MCA territory) without hemorrhage       Mechanism of stroke is embolic - suspected to be cardioembolic:      TTE with possible myxoma --> TEE with large myxoma with Green small atrial shunt        LDL 105, A1c 7.7%     Aphasia, mild R hemiparesis, L gaze preference have all markedly improved    2. Recent renal stent 08/05/16    3. H/o DM, HTN, HLD    Recommendations:   - She is neurologically cleared to undergo CT surgery. She is already 5 days post stroke (small infarct) and risk of hemorrhagic conversion by time of surgery would be exceedingly low.   - Continue statin for secondary stroke prevention.  O.K. To hold Plavix for cardiac surgery. Recommend low intensity heparin drip (no bolus) while awaiting cardiac surgery to prevent further stroke  - Continue medical management by primary team  - Supportive therapies:  PT/OT/SLP.     Neurology Attending Addendum:    I have personally reviewed the interval history, images and pertinent test results and I have personally examined the patient and confirmed the major physical findings of the preceding NP/PA note.  Also, I have noted any changes since the note was written as well as added additional findings and recommendations.    Glenda Green was seen for neurological follow-up and is stable.  She is verbal with Green good affect.  .  Transesophageal echocardiogram does reveal in the right atrial cavity.  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  .  At this time.   She is been cleared for surgical intervention.  Marland Kitchen  No change in nerve therapy and no further neurodiagnostic studies indicated at this time.  .  [facility] will follow post surgery.    Glenda Ship, MD  [facility]  Neuro-hospitalist  [facility]  SpectraLink [phone]        Interval History/Subjective:   No new events overnight. Pt is verbal, with good affect, denies new focal weakness, numbness, vision changes, speech changes.     Transesophageal Echocardiogram 08/12/16    -  There was Green probable small atrial septal defect.  -  No defect or patent foramen ovale was identified.  -  The RA cavity is almost entirely occupied by Green large (At least 5.5 x 3.5 cm), echodense, somewhat speckled-appearing echogenic mass. The point of attcahment is not well seen but it is most closely associated with the superior aspects of the RA. It does not directly overlay the tricuspid annulus, nor does it prolapse across the tricuspid orifice during diastole. The visual appearance is most consistent with Green large right atrial myxoma.    Medications:     Current Facility-Administered Medications   Medication Dose Route Frequency   . amLODIPine  10 mg Oral Daily   . atorvastatin  40  mg Oral QHS   . insulin glargine  8 Units Subcutaneous QHS   . metoprolol XL  100 mg Oral Daily   . sodium bicarbonate  1,300 mg Oral QID       Review of Systems:   All systems reviewed and are negative, except for those mentioned above.     Physical Exam:   Temp:  [96.9 F (36.1 C)-98.5 F (36.9 C)] 98 F (36.7 C)  Heart Rate:  [62-70] 66  Resp Rate:  [16-18] 16  BP: (120-142)/(55-81) 138/63    Vital Signs: Reviewed.    General: well developed, well nourished elderly woman in no acute distress. Cooperative with exam.  Follows commands appropriately  Extremities: no pedal edema, extremities normal in color    Mental Status: Patient is awake, alert and oriented to person, place, and time.  Affect is normal  Fund of knowledge  is intact  Recent and remote memory appear intact  Language function is normal.  There is no evidence of aphasia and conversational speech    Cranial nerves:   -CN II: Visual fields testing shows some left-sided visual loss  -CN III, IV, VI: Gaze midline, conjugate. No gaze preference. EOMI on observation.   -CN V: Facial sensation intact   -CN VII: Left facial droop  -CN VIII: Hearing intact to conversational speech.  -CN IX, X: Normal phonation  -CN XI: Symmetric strength of sternocleidomastoid and trapezius muscles  -CN XII: Tongue protrudes midline.    Motor: Muscle bulk normal. Muscle tone normal. RUE pronator drift. Strength seems to be 5/5 throughout UE and LE With the exception of left lower extremity weakness at 4/5.     Sensory: Light touch intact    Reflexes: Toes downgoing on right.  Patient's left great toe is absent. DTRs hyporreflexive throughout UE and LE.     Coordination: FTN intact. No tremors.     Gait: Deferred Due to concern for patient safety due to patient condition    Labs:     Results     Procedure Component Value Units Date/Time    Basic Metabolic Panel [[id number]]  (Abnormal) Collected:  08/15/16 1002    Specimen:  Blood Updated:  08/15/16 1035     Glucose 313 (H) mg/dL      BUN 69.6 (H) mg/dL      Creatinine 1.5 (H) mg/dL      Calcium 8.5 mg/dL      Sodium 295 mEq/L      Potassium 4.3 mEq/L      Chloride 104 mEq/L      CO2 24 mEq/L     GFR [[id number]] Collected:  08/15/16 1002     Updated:  08/15/16 1035     EGFR 33.6    APTT [[id number]]  (Abnormal) Collected:  08/15/16 0821     Updated:  08/15/16 0910     PTT 79 (H) sec      APTT Anticoag. Given w/i 48 hrs. heparin    CBC [[id number]]  (Abnormal) Collected:  08/15/16 0821    Specimen:  Blood from Blood Updated:  08/15/16 0856     WBC 8.35 x10 3/uL      Hgb 11.4 (L) g/dL      Hematocrit 95.6 (L) %      Platelets 379 x10 3/uL      RBC 4.13 (L) x10 6/uL      MCV 87.2 fL      MCH 27.6 (L) pg      MCHC 31.7 (  L) g/dL      RDW 15 %       MPV 10.1 fL      Nucleated RBC 0.0 /100 WBC      Absolute NRBC 0.00 x10 3/uL     Narrative:       Per low intensity heparin protocol.    Glucose Whole Blood - POCT [[id number]]  (Abnormal) Collected:  08/15/16 0727     Updated:  08/15/16 0749     POCT - Glucose Whole blood 173 (H) mg/dL     APTT [[id number]]  (Abnormal) Collected:  08/15/16 0041     Updated:  08/15/16 0121     PTT 43 (H) sec      APTT Anticoag. Given w/i 48 hrs. heparin    Glucose Whole Blood - POCT [[id number]]  (Abnormal) Collected:  08/14/16 2204     Updated:  08/14/16 2214     POCT - Glucose Whole blood 414 (H) mg/dL     Glucose Whole Blood - POCT [[id number]]  (Abnormal) Collected:  08/14/16 1704     Updated:  08/14/16 1709     POCT - Glucose Whole blood 289 (H) mg/dL     MRSA culture [[id number]] Collected:  08/13/16 1156    Specimen:  Body Fluid from Nasal Swab Updated:  08/14/16 1545    Narrative:       ORDER#: [id number]                                    ORDERED BY: Janora Norlander  SOURCE: N[facility]                                        COLLECTED:  08/13/16 11:56  ANTIBIOTICS AT COLL.:                                RECEIVED :  08/13/16 14:58  Culture MRSA Surveillance                  FINAL       08/14/16 15:45  08/14/16   Negative for Methicillin Resistant Staph aureus      Protein / creatinine ratio, urine [[id number]]  (Abnormal) Collected:  08/14/16 1109    Specimen:  Urine Updated:  08/14/16 1407     Urine Protein Random 187.3 (H) mg/dL      Urine Creatinine, Random 38.4 mg/dL      Urine Protein/Creatinine Ratio 4.9    Narrative:       Per low intensity heparin protocol.    APTT [[id number]]  (Abnormal) Collected:  08/14/16 1224     Updated:  08/14/16 1331     PTT 59 (H) sec      APTT Anticoag. Given w/i 48 hrs. heparin    GFR [[id number]] Collected:  08/14/16 1225     Updated:  08/14/16 1316     EGFR 27.2    Narrative:       Per low intensity heparin protocol.    Basic Metabolic Panel [[id number]]  (Abnormal) Collected:  08/14/16 1225     Specimen:  Blood Updated:  08/14/16 1316     Glucose 414 (H) mg/dL      BUN 30.1 (H) mg/dL      Creatinine 1.8 (H)  mg/dL      Calcium 8.6 mg/dL      Sodium 010 (L) mEq/L      Potassium 4.7 mEq/L      Chloride 105 mEq/L      CO2 22 mEq/L     Narrative:       Per low intensity heparin protocol.    Magnesium [[id number]] Collected:  08/14/16 1225    Specimen:  Blood Updated:  08/14/16 1316     Magnesium 1.8 mg/dL     Narrative:       Per low intensity heparin protocol.    Phosphorus [[id number]] Collected:  08/14/16 1225    Specimen:  Blood Updated:  08/14/16 1316     Phosphorus 4.1 mg/dL     Narrative:       Per low intensity heparin protocol.    CBC [[id number]]  (Abnormal) Collected:  08/14/16 1225    Specimen:  Blood from Blood Updated:  08/14/16 1301     WBC 7.43 x10 3/uL      Hgb 12.0 g/dL      Hematocrit 17.6 %      Platelets 333 x10 3/uL      RBC 4.39 x10 6/uL      MCV 86.1 fL      MCH 27.3 (L) pg      MCHC 31.7 (L) g/dL      RDW 15 %      MPV 10.2 fL      Nucleated RBC 0.0 /100 WBC      Absolute NRBC 0.00 x10 3/uL     Narrative:       Per low intensity heparin protocol.          Rads:   Ct Angiogram Head    Result Date: 08/08/2016  1.  Possible very subtle perfusion abnormality about the posterior aspect of left MCA territory. No definite thrombosis or definite penumbra. 2.  Lucency evident involving the right medial occipital cortex and the left cerebellar cortex, consistent with ischemia, age-indeterminate but likely old. 3.  Atherosclerotic stenosis as noted above.  Einar Pheasant, MD 08/08/2016 1:23 PM     Ct Head Wo Contrast    Result Date: 08/08/2016      Lucency of the left cerebellar cortex in the right medial occipital cortex, findings consistent with infarction, age indeterminant but likely old.    Einar Pheasant, MD 08/08/2016 12:49 PM     Ct Head W Contrast    Result Date: 08/08/2016  1.  Possible very subtle perfusion abnormality about the posterior aspect of left MCA territory. No definite thrombosis or  definite penumbra. 2.  Lucency evident involving the right medial occipital cortex and the left cerebellar cortex, consistent with ischemia, age-indeterminate but likely old. 3.  Atherosclerotic stenosis as noted above.  Einar Pheasant, MD 08/08/2016 1:23 PM     Ct Angiogram Cerebral Perfusion W 3d Reconstruction    Result Date: 08/08/2016  1.  Possible very subtle perfusion abnormality about the posterior aspect of left MCA territory. No definite thrombosis or definite penumbra. 2.  Lucency evident involving the right medial occipital cortex and the left cerebellar cortex, consistent with ischemia, age-indeterminate but likely old. 3.  Atherosclerotic stenosis as noted above.  Einar Pheasant, MD 08/08/2016 1:23 PM     Ct Angiogram Neck    Result Date: 08/08/2016  1.  Possible very subtle perfusion abnormality about the posterior aspect of left MCA territory. No definite  thrombosis or definite penumbra. 2.  Lucency evident involving the right medial occipital cortex and the left cerebellar cortex, consistent with ischemia, age-indeterminate but likely old. 3.  Atherosclerotic stenosis as noted above.  Einar Pheasant, MD 08/08/2016 1:23 PM     Ct Chest Wo Contrast    Result Date: 08/13/2016  1. Irregular opacity at the right lung base medially. Findings may be due to scarring however metastatic disease is not excluded. Follow-up in the prior studies are available then comparison to prior studies is recommended.. 2. Sclerotic lesion left T4 pedicle. This is indeterminate. Metastatic disease is not excluded. Other findings as above. Jasmine December  D'Heureux, MD 08/13/2016 7:10 PM     Mri Brain Wo Contrast    Result Date: 08/09/2016   1. Small, acute cortical infarct involving Green single left parietal gyrus without mass effect or hemorrhage. 2. Generalized cerebral volume loss and advanced sequela of chronic microvascular change. 3. Chronic left cerebellar infarct. 4. Limited exam. Waynard Edwards, MD 08/09/2016 9:26 PM     US Renal  Kidney    Result Date: 08/12/2016   Renal ultrasound shows no significant change in the degree of right hydronephrosis despite adequate positioning of the ureteral stent.         The urinary bladder is decompressed by Green Foley catheter.                           Miguel Dibble, MD 08/12/2016 7:58 AM     US Renal Kidney    Result Date: 08/09/2016   Bilateral collecting system dilatation right more than left. Stent present on the right can be followed from the renal pelvis into the bladder. Bladder trabeculation. Mild increase in renal cortical echogenicity bilaterally. Comment: Findings discussed with the patient's attending physician at the time of dictation. Annabell Sabal, MD 08/09/2016 5:37 PM     Xr Chest  Ap Portable    Result Date: 08/08/2016   Hypoventilatory change, basilar atelectasis. Geanie Cooley, MD 08/08/2016 1:32 PM     Ct Abdomen Pelvis W Po Contrast Only    Result Date: 08/13/2016  1. No evidence of metastatic disease on this noncontrast study. 2. Bilateral hydronephrosis. Right ureteral stent in place. 3. Fatty lesion in the inferior to the left kidney of uncertain etiology. Findings may be due to fat necrosis however liposarcoma is not excluded. 4. Other findings as above. Jasmine December  D'Heureux, MD 08/13/2016 7:20 PM         Signed by:  Alvis Lemmings  [facility]  Spectra link:[phone]  After 5:00 pm: [phone]    Please see attending Neurologist note that accompanies this mid-level encounter note.

## 2016-08-15 NOTE — Progress Notes (Signed)
HOSPITALIST PROGRESS NOTE    Date Time: 08/15/16 1:05 PM  Patient Name: Glenda Green  Attending Physician: Gildardo Griffes, DO        Subjective:   Patient seen and examined. She is very alert and interactive this morning. AAO x 3. No chest pain, SOB, abdominal pain, N/V/D. No fevers or chills. No headache.    RN at bedside - as part of [facility] rounding.    Family updated by me at bedside.     TEE done already - confirms myxoma.     For Cardiac Cath Monday.  For CT surgery next week.    Discussed with urology yesterday - no plans for urological intervention. Her L kidney has no obstruction on repeat renal US (she also had Green normal renal lasix scan on left side last month).     Nephro input noted - discussed at bedside with Nephro.      Physical Exam:     Vitals:    08/15/16 1224   BP: 122/60   Pulse: 63   Resp: 16   Temp: (!) 96.6 F (35.9 C)   SpO2: 97%       Intake and Output Summary (Last 24 hours) at Date Time    Intake/Output Summary (Last 24 hours) at 08/15/16 1305  Last data filed at 08/15/16 1003   Gross per 24 hour   Intake             1236 ml   Output             1400 ml   Net             -164 ml       General: awake, alert, oriented x 3; no acute distress, mental status is much improved today  HEENT: perrla, eomi, sclera anicteric  oropharynx clear without lesions, mucous membranes moist  Neck: supple, no lymphadenopathy, no thyromegaly, no JVD, no carotid bruits  Cardiovascular: regular rate and rhythm, ++ systolic murmur, rubs or gallops  Lungs: clear to auscultation bilaterally, without wheezing, rhonchi, or rales  Abdomen: soft, non-tender, non-distended; no palpable masses, no hepatosplenomegaly, normoactive bowel sounds, no rebound or guarding  Extremities: no clubbing, cyanosis, or edema  Neuro: cranial nerves grossly intact, moves all extremities, RUE pronator drift, R Hemineglect -- neuro exam improving  Skin: no rashes or lesions noted      Medications:     Current Facility-Administered  Medications   Medication Dose Route Frequency   . amLODIPine  10 mg Oral Daily   . atorvastatin  40 mg Oral QHS   . insulin glargine  8 Units Subcutaneous QHS   . metoprolol XL  100 mg Oral Daily   . sodium bicarbonate  1,300 mg Oral QID         Labs:     Results     Procedure Component Value Units Date/Time    Kappa/Lambda Light Chain, Total [[id number]] Collected:  08/15/16 1219     Updated:  08/15/16 1231    Glucose Whole Blood - POCT [[id number]]  (Abnormal) Collected:  08/15/16 1213     Updated:  08/15/16 1230     POCT - Glucose Whole blood 282 (H) mg/dL     Immunofixation electrophoresis, Serum [[id number]] Collected:  08/15/16 1219    Specimen:  Blood Updated:  08/15/16 1219    Basic Metabolic Panel [[id number]]  (Abnormal) Collected:  08/15/16 1002    Specimen:  Blood Updated:  08/15/16 1035  Glucose 313 (H) mg/dL      BUN 27.2 (H) mg/dL      Creatinine 1.5 (H) mg/dL      Calcium 8.5 mg/dL      Sodium 536 mEq/L      Potassium 4.3 mEq/L      Chloride 104 mEq/L      CO2 24 mEq/L     GFR [[id number]] Collected:  08/15/16 1002     Updated:  08/15/16 1035     EGFR 33.6    APTT [[id number]]  (Abnormal) Collected:  08/15/16 0821     Updated:  08/15/16 0910     PTT 79 (H) sec      APTT Anticoag. Given w/i 48 hrs. heparin    CBC [[id number]]  (Abnormal) Collected:  08/15/16 0821    Specimen:  Blood from Blood Updated:  08/15/16 0856     WBC 8.35 x10 3/uL      Hgb 11.4 (L) g/dL      Hematocrit 41.6 (L) %      Platelets 379 x10 3/uL      RBC 4.13 (L) x10 6/uL      MCV 87.2 fL      MCH 27.6 (L) pg      MCHC 31.7 (L) g/dL      RDW 15 %      MPV 10.1 fL      Nucleated RBC 0.0 /100 WBC      Absolute NRBC 0.00 x10 3/uL     Narrative:       Per low intensity heparin protocol.    Glucose Whole Blood - POCT [[id number]]  (Abnormal) Collected:  08/15/16 0727     Updated:  08/15/16 0749     POCT - Glucose Whole blood 173 (H) mg/dL     APTT [[id number]]  (Abnormal) Collected:  08/15/16 0041     Updated:  08/15/16 0121     PTT 43 (H)  sec      APTT Anticoag. Given w/i 48 hrs. heparin    Glucose Whole Blood - POCT [[id number]]  (Abnormal) Collected:  08/14/16 2204     Updated:  08/14/16 2214     POCT - Glucose Whole blood 414 (H) mg/dL     Glucose Whole Blood - POCT [[id number]]  (Abnormal) Collected:  08/14/16 1704     Updated:  08/14/16 1709     POCT - Glucose Whole blood 289 (H) mg/dL     MRSA culture [[id number]] Collected:  08/13/16 1156    Specimen:  Body Fluid from Nasal Swab Updated:  08/14/16 1545    Narrative:       ORDER#: [id number]                                    ORDERED BY: Janora Norlander  SOURCE: [facility]ares                                        COLLECTED:  08/13/16 11:56  ANTIBIOTICS AT COLL.:                                RECEIVED :  08/13/16 14:58  Culture MRSA Surveillance  FINAL       08/14/16 15:45  08/14/16   Negative for Methicillin Resistant Staph aureus      Protein / creatinine ratio, urine [[id number]]  (Abnormal) Collected:  08/14/16 1109    Specimen:  Urine Updated:  08/14/16 1407     Urine Protein Random 187.3 (H) mg/dL      Urine Creatinine, Random 38.4 mg/dL      Urine Protein/Creatinine Ratio 4.9    Narrative:       Per low intensity heparin protocol.    APTT [[id number]]  (Abnormal) Collected:  08/14/16 1224     Updated:  08/14/16 1331     PTT 59 (H) sec      APTT Anticoag. Given w/i 48 hrs. heparin    GFR [[id number]] Collected:  08/14/16 1225     Updated:  08/14/16 1316     EGFR 27.2    Narrative:       Per low intensity heparin protocol.    Basic Metabolic Panel [[id number]]  (Abnormal) Collected:  08/14/16 1225    Specimen:  Blood Updated:  08/14/16 1316     Glucose 414 (H) mg/dL      BUN 84.1 (H) mg/dL      Creatinine 1.8 (H) mg/dL      Calcium 8.6 mg/dL      Sodium 324 (L) mEq/L      Potassium 4.7 mEq/L      Chloride 105 mEq/L      CO2 22 mEq/L     Narrative:       Per low intensity heparin protocol.    Magnesium [[id number]] Collected:  08/14/16 1225    Specimen:  Blood Updated:  08/14/16 1316      Magnesium 1.8 mg/dL     Narrative:       Per low intensity heparin protocol.    Phosphorus [[id number]] Collected:  08/14/16 1225    Specimen:  Blood Updated:  08/14/16 1316     Phosphorus 4.1 mg/dL     Narrative:       Per low intensity heparin protocol.            Radiology:     Radiology Results (24 Hour)     ** No results found for the last 24 hours. **            Assessment:     Patient Active Problem List   Diagnosis   . Hydronephrosis of right kidney   . CVA (cerebral vascular accident)   . Atrial myxoma   . Bilateral hydronephrosis   . UTI (urinary tract infection)   . AKI (acute kidney injury)         Plan:   # Left MCA Syndrome with ACUTE CVA  - Continue IV Heparin gtt (without Bolus) - as instructed by Neurology  - For cardiac cath Monday and surgery next week  - Continue Lipitor 40mg   - TEE confirms: Large RA mass taking up most or FRA cavity but not obstructing or prolapsing across the TV orifice. Size: at least 5.5 cm in height and 3.5 cm in width.  - Continue on cardiac monitor  - Noted MRI/MRA results    # R Atrial Myxoma  - Noted on Echo and confirmed on TEE  - TEE confirms: Large RA mass taking up most or FRA cavity but not obstructing or prolapsing across the TV orifice. Size: at least 5.5 cm in height and 3.5 cm in width.  - Appreciate Cardiology recommendations  -  CT Chest/Abdomen/Pelvis noted  - CT Surgery - for OR next seek    # AKI  - Renal function improving today - repeat Renal US shows decompressed bladder (s/p Foley)  - Keep Foley in for now  - Discussed with urology yesterday - no plans for any acute intervention  - Appreciate Nephrology input - Nephro sending out few new labs / work up    # Bilateral Hydronephrosis   - Continue Foley as per discussion with urology    # UTI  - Continue IV Ceftriaxone for now -- will most likely complete her course here prior to discharge  - Noted urine cultures    # HTN  - Continue Metoprolol 100mg    - Continue Amlodipine 10mg   - Needs good and  aggressive BP control -- well controlled on the new regimen  - Continue to monitor BP    # DM2  - Continue SSI      DVT Prophylaxis: SQ Heparin  Diet: Cardiac Diet  Anticipated discharge disposition and date:  Next week    Signed by: Gildardo Griffes, D.O.   cc:Pcp, Noneorunknown, MD

## 2016-08-15 NOTE — Plan of Care (Addendum)
Problem: Every Day - Stroke  Goal: Neurological status is stable or improving  Patient neurologically stable.  PTT=43, nontherapeutic, heparin drip increased to 14 u/kg/hr from 12 u/kg/hr.  Next PTT @ 0740, order placed in system, Clin tech [place] aware of next scheduled draw.  Bloodsugar @ HS= 414. CNS Dr. Cyndie Chime made aware. Lantus started.  SSI administered as ordered.

## 2016-08-15 NOTE — Progress Notes (Signed)
[facility], [employer].  Progress Note    Assessment:    -AKI due to hemodynamics and fluid status; serum creatinine better at 1.5 mg/dL;   -DM with CKD; probable diabetic nephropathy   -Nephrotic range proteinuria; possible due to diabetes but we'll rule out other etiologies   -Right atrial mass; plan for LHC on Monday   -CVA  -History of bilateral hydronephrosis; per urology no evidence of obstruction or hydro at this time     Plan:    -supportive care   -labs   -check serum and urine immunofixation   -avoid nephrotoxins   -We'll start NS at 1 mL/Kg/hr midnight tomorrow night in anticipation for LHC on [facility]    Burnis Medin, MD  Office - [phone]  [employer] - [phone]  ++++++++++++++++++++++++++++++++++++++++++++++++++++++++++++++  Subjective:  No new complaints    Medications:  Scheduled Meds:  Current Facility-Administered Medications   Medication Dose Route Frequency   . amLODIPine  10 mg Oral Daily   . atorvastatin  40 mg Oral QHS   . insulin glargine  8 Units Subcutaneous QHS   . metoprolol XL  100 mg Oral Daily   . sodium bicarbonate  1,300 mg Oral QID     Continuous Infusions:  . heparin 25,000 units in dextrose 5% 500 mL 13 Units/kg/hr (08/15/16 1003)     PRN Meds:acetaminophen **OR** acetaminophen, Nursing communication: Adult Hypoglycemia Treatment Algorithm **AND** dextrose **AND** dextrose **AND** glucagon (rDNA) **AND** Nursing communication: Document Significant Event Note, hydrALAZINE, insulin lispro, insulin lispro, LORazepam, ondansetron **OR** ondansetron, traMADol    Objective:  Vital signs in last 24 hours:  Temp:  [96.9 F (36.1 C)-98.5 F (36.9 C)] 98 F (36.7 C)  Heart Rate:  [62-70] 66  Resp Rate:  [16-18] 16  BP: (120-142)/(55-81) 138/63  Intake/Output last 24 hours:    Intake/Output Summary (Last 24 hours) at 08/15/16 1102  Last data filed at 08/15/16 0800   Gross per 24 hour   Intake             1036 ml   Output             1400 ml   Net              -364 ml     Intake/Output this shift:  I/O this shift:  In: 318 [P.O.:318]  Out: -     Physical Exam:   Gen: WD WN NAD   CV: S1 S2 N RRR   Chest: CTAB   Ab: ND NT soft no HSM +BS   Ext: No C/E    Labs:    Recent Labs  Lab 08/15/16  1002 08/14/16  1225 08/13/16  1325 08/12/16  0330 08/11/16  1331 08/10/16  0400  08/09/16  0440 08/08/16  1318   Glucose 313* 414* 295* 160* 141* 138*  --  97 136*   BUN 26.0* 28.0* 28.0* 25.0* 28.0* 25.0*  --  23.0* 28.0*   Creatinine 1.5* 1.8* 1.6* 1.3* 1.4* 1.7*  --  1.5* 1.5*   Calcium 8.5 8.6 8.9 8.4 8.7 8.1  --  8.4 8.7   Sodium 137 135* 134* 137 141 138  --  134* 130*   Potassium 4.3 4.7 5.3* 3.8 4.6 4.1  --  4.1 4.8   Chloride 104 105 108 111 114* 111  --  105 99*   CO2 24 22 13* 21* 19* 21*  --  23 22   Albumin  --   --   --   --   --   --   --  3.2* 3.3*   Phosphorus  --  4.1  --  3.5  --  4.0  --   --   --    Magnesium  --  1.8  --  2.1 1.6 1.4* More results in Results Review  --   --    More results in Results Review = values in this interval not displayed.    Recent Labs  Lab 08/15/16  0821 08/14/16  1225 08/13/16  1724   WBC 8.35 7.43 7.68   Hgb 11.4* 12.0 11.7*   Hematocrit 36.0* 37.8 36.6*   MCV 87.2 86.1 86.1   MCH 27.6* 27.3* 27.5*   MCHC 31.7* 31.7* 32.0   RDW 15 15 15    MPV 10.1 10.2 10.0   Platelets [phone]

## 2016-08-15 NOTE — Plan of Care (Signed)
Pt A & OX4, follows command, moves all extremity,uses walker. On tele (NS), RA. On heparin drip @ 13 unit/kg/hr.APTT drawn around 1610 ; APTT= 64. Next APTT to be drawen @0410 . on foley, foley care done. Finger stick, insuline given per sliding scale. Son at the bed side.  Fall precautions in place. Will continue to monitor.

## 2016-08-16 LAB — CBC
Absolute NRBC: 0 10*3/uL
Hematocrit: 35 % — ABNORMAL LOW (ref 37.0–47.0)
Hgb: 11.2 g/dL — ABNORMAL LOW (ref 12.0–16.0)
MCH: 27.6 pg — ABNORMAL LOW (ref 28.0–32.0)
MCHC: 32 g/dL (ref 32.0–36.0)
MCV: 86.2 fL (ref 80.0–100.0)
MPV: 10.1 fL (ref 9.4–12.3)
Nucleated RBC: 0 /100 WBC (ref 0.0–1.0)
Platelets: 360 10*3/uL (ref 140–400)
RBC: 4.06 10*6/uL — ABNORMAL LOW (ref 4.20–5.40)
RDW: 15 % (ref 12–15)
WBC: 8.72 10*3/uL (ref 3.50–10.80)

## 2016-08-16 LAB — GLUCOSE WHOLE BLOOD - POCT
Whole Blood Glucose POCT: 161 mg/dL — ABNORMAL HIGH (ref 70–100)
Whole Blood Glucose POCT: 253 mg/dL — ABNORMAL HIGH (ref 70–100)
Whole Blood Glucose POCT: 251 mg/dL — ABNORMAL HIGH (ref 70–100)
Whole Blood Glucose POCT: 335 mg/dL — ABNORMAL HIGH (ref 70–100)

## 2016-08-16 LAB — BASIC METABOLIC PANEL
BUN: 28 mg/dL — ABNORMAL HIGH (ref 7.0–19.0)
CO2: 22 mEq/L (ref 22–29)
Calcium: 8.8 mg/dL (ref 7.9–10.2)
Chloride: 106 mEq/L (ref 100–111)
Creatinine: 1.5 mg/dL — ABNORMAL HIGH (ref 0.6–1.0)
Glucose: 153 mg/dL — ABNORMAL HIGH (ref 70–100)
Potassium: 4.3 mEq/L (ref 3.5–5.1)
Sodium: 138 mEq/L (ref 136–145)

## 2016-08-16 LAB — APTT
PTT: 70 s — ABNORMAL HIGH (ref 23–37)
PTT: 53 s — ABNORMAL HIGH (ref 23–37)
PTT: 71 s — ABNORMAL HIGH (ref 23–37)

## 2016-08-16 LAB — KAPPA/LAMBDA LIGHT CHAIN, TOTAL
Kappa, Quantitative: 205 mg/dL (ref 176–443)
Kappa/Lambda Ratio: 1.51 (ref 1.29–2.55)
Lambda, Quantitative: 136 mg/dL (ref 91–240)

## 2016-08-16 LAB — GFR: EGFR: 33.6

## 2016-08-16 LAB — PHOSPHORUS: Phosphorus: 4.5 mg/dL (ref 2.3–4.7)

## 2016-08-16 LAB — MAGNESIUM: Magnesium: 1.7 mg/dL (ref 1.6–2.6)

## 2016-08-16 MED ORDER — SODIUM CHLORIDE 0.9 % IV SOLN
INTRAVENOUS | Status: DC
Start: 2016-08-17 — End: 2016-08-17

## 2016-08-16 NOTE — Progress Notes (Signed)
HOSPITALIST PROGRESS NOTE    Date Time: 08/16/16 2:58 PM  Patient Name: Glenda Green  Attending Physician: Gildardo Griffes, DO        Subjective:   Patient seen and examined. She is very alert and interactive this morning. AAO x 3. No chest pain, SOB, abdominal pain, N/V/D. No fevers or chills. No headache.    RN at bedside - as part of Trio rounding.    Family updated by me at bedside.     TEE done already - confirms myxoma.     For Cardiac Cath Monday.  For CT surgery next week.      Physical Exam:     Vitals:    08/16/16 1132   BP: 121/72   Pulse: 71   Resp: 16   Temp: (!) 96.5 F (35.8 C)   SpO2: 94%       Intake and Output Summary (Last 24 hours) at Date Time    Intake/Output Summary (Last 24 hours) at 08/16/16 1458  Last data filed at 08/16/16 1610   Gross per 24 hour   Intake              400 ml   Output             1350 ml   Net             -950 ml       General: awake, alert, oriented x 3; no acute distress, mental status is much improved today  HEENT: perrla, eomi, sclera anicteric  oropharynx clear without lesions, mucous membranes moist  Neck: supple, no lymphadenopathy, no thyromegaly, no JVD, no carotid bruits  Cardiovascular: regular rate and rhythm, ++ systolic murmur, rubs or gallops  Lungs: clear to auscultation bilaterally, without wheezing, rhonchi, or rales  Abdomen: soft, non-tender, non-distended; no palpable masses, no hepatosplenomegaly, normoactive bowel sounds, no rebound or guarding  Extremities: no clubbing, cyanosis, or edema  Neuro: cranial nerves grossly intact, moves all extremities, RUE pronator drift, R Hemineglect -- neuro exam improving  Skin: no rashes or lesions noted      Medications:     Current Facility-Administered Medications   Medication Dose Route Frequency   . amLODIPine  10 mg Oral Daily   . atorvastatin  40 mg Oral QHS   . insulin glargine  8 Units Subcutaneous QHS   . metoprolol XL  100 mg Oral Daily   . sodium bicarbonate  1,300 mg Oral QID         Labs:      Results     Procedure Component Value Units Date/Time    Glucose Whole Blood - POCT [[id number]]  (Abnormal) Collected:  08/16/16 1131     Updated:  08/16/16 1135     POCT - Glucose Whole blood 253 (H) mg/dL     Glucose Whole Blood - POCT [[id number]]  (Abnormal) Collected:  08/16/16 0732     Updated:  08/16/16 0740     POCT - Glucose Whole blood 161 (H) mg/dL     APTT [[id number]]  (Abnormal) Collected:  08/16/16 0410     Updated:  08/16/16 0512     PTT 70 (H) sec      APTT Anticoag. Given w/i 48 hrs. heparin    Narrative:       Per low intensity heparin protocol.    Phosphorus [[id number]] Collected:  08/16/16 0410    Specimen:  Blood Updated:  08/16/16 0505     Phosphorus 4.5 mg/dL  Narrative:       Per low intensity heparin protocol.    GFR [[id number]] Collected:  08/16/16 0410     Updated:  08/16/16 0505     EGFR 33.6    Narrative:       Per low intensity heparin protocol.    Basic Metabolic Panel [[id number]]  (Abnormal) Collected:  08/16/16 0410    Specimen:  Blood Updated:  08/16/16 0505     Glucose 153 (H) mg/dL      BUN 78.2 (H) mg/dL      Creatinine 1.5 (H) mg/dL      Calcium 8.8 mg/dL      Sodium 956 mEq/L      Potassium 4.3 mEq/L      Chloride 106 mEq/L      CO2 22 mEq/L     Narrative:       Per low intensity heparin protocol.    Magnesium [[id number]] Collected:  08/16/16 0410    Specimen:  Blood Updated:  08/16/16 0505     Magnesium 1.7 mg/dL     Narrative:       Per low intensity heparin protocol.    CBC [[id number]]  (Abnormal) Collected:  08/16/16 0410    Specimen:  Blood from Blood Updated:  08/16/16 0447     WBC 8.72 x10 3/uL      Hgb 11.2 (L) g/dL      Hematocrit 41.3 (L) %      Platelets 360 x10 3/uL      RBC 4.06 (L) x10 6/uL      MCV 86.2 fL      MCH 27.6 (L) pg      MCHC 32.0 g/dL      RDW 15 %      MPV 10.1 fL      Nucleated RBC 0.0 /100 WBC      Absolute NRBC 0.00 x10 3/uL     Narrative:       Per low intensity heparin protocol.    Glucose Whole Blood - POCT [[id number]]  (Abnormal)  Collected:  08/15/16 2115     Updated:  08/15/16 2122     POCT - Glucose Whole blood 295 (H) mg/dL     URINE IMMUNOFIXATION [[id number]] Collected:  08/15/16 1453     Updated:  08/15/16 1841    APTT [[id number]]  (Abnormal) Collected:  08/15/16 1612     Updated:  08/15/16 1707     PTT 64 (H) sec      APTT Anticoag. Given w/i 48 hrs. heparin    Glucose Whole Blood - POCT [[id number]]  (Abnormal) Collected:  08/15/16 1611     Updated:  08/15/16 1619     POCT - Glucose Whole blood 331 (H) mg/dL             Radiology:     Radiology Results (24 Hour)     ** No results found for the last 24 hours. **            Assessment:     Patient Active Problem List   Diagnosis   . Hydronephrosis of right kidney   . CVA (cerebral vascular accident)   . Atrial myxoma   . Bilateral hydronephrosis   . UTI (urinary tract infection)   . AKI (acute kidney injury)         Plan:   # Left MCA Syndrome with ACUTE CVA  - Continue IV Heparin gtt (without Bolus) - as instructed by Neurology  -  For cardiac cath Monday and surgery next week  - Continue Lipitor 40mg   - TEE confirms: Large RA mass taking up most or FRA cavity but not obstructing or prolapsing across the TV orifice. Size: at least 5.5 cm in height and 3.5 cm in width.  - Continue on cardiac monitor  - Noted MRI/MRA results    # R Atrial Myxoma  - Noted on Echo and confirmed on TEE  - TEE confirms: Large RA mass taking up most or FRA cavity but not obstructing or prolapsing across the TV orifice. Size: at least 5.5 cm in height and 3.5 cm in width.  - Appreciate Cardiology recommendations  - CT Chest/Abdomen/Pelvis noted  - CT Surgery - for OR next seek    # AKI  - Renal function improving today - repeat Renal US shows decompressed bladder (s/p Foley)  - Keep Foley in for now  - Discussed with urology yesterday - no plans for any acute intervention  - Appreciate Nephrology input - Nephro sending out few new labs / work up  - Starting IVFs tonight before cath for contrast nephropathy  prevention    # Bilateral Hydronephrosis   - Continue Foley as per discussion with urology    # UTI  - Continue IV Ceftriaxone for now -- will most likely complete her course here prior to discharge  - Noted urine cultures    # HTN  - Continue Metoprolol 100mg    - Continue Amlodipine 10mg   - Needs good and aggressive BP control -- well controlled on the new regimen  - Continue to monitor BP    # DM2  - Continue SSI      DVT Prophylaxis: SQ Heparin  Diet: Cardiac Diet  Anticipated discharge disposition and date:  Next week    Signed by: Gildardo Griffes, D.O.   cc:Pcp, Noneorunknown, MD

## 2016-08-16 NOTE — Plan of Care (Addendum)
Problem: Every Day - Stroke  Goal: Stable vital signs and fluid balance   08/16/16 0357   Goal/Interventions addressed this shift   Stable vital signs and fluid balance  Position patient for maximum circulation/cardiac output;Monitor and assess vitals every 4 hours or as ordered and hemodynamic parameters;Monitor intake and output. Notify LIP if urine output is < 30 mL/hour.;Apply telemetry monitor as ordered       Comments: Tylenol administered for headache around 2300.  Afforded relief.    PTT=70, thrapeutic.  Next PTT @ 1610. Order in system.

## 2016-08-16 NOTE — Plan of Care (Signed)
Pt A &OX4, follows command, moves all extremity,uses walker. On tele (NS), RA. On heparin drip @14  unit/kg/hr.APTT drawn around 1610 ; APTT=53  Next APTT to be drawen @ 2210, on foley, foley care done. BM noted today. Finger stick, insuline given per sliding scale. NPO after mid night. Son at the bed side.  Fall precautions in place. Will continue to monitor.

## 2016-08-16 NOTE — Progress Notes (Signed)
[facility] [employer].  Progress Note    Assessment:    -AKI due to hemodynamics and fluid status; serum creatinine 1.5 mg/dL;   -DM with CKD; probable diabetic nephropathy   -Nephrotic range proteinuria; possible due to diabetes but we'll rule out other etiologies   -Right atrial mass; plan for LHC on Monday   -CVA  -History of bilateral hydronephrosis; per urology no evidence of obstruction or hydro at this time     Plan:    -supportive care   -labs   -check serum and urine immunofixation   -avoid nephrotoxins   -start NS at 1 mL/Kg/hr at midnight in anticipation for [facility] on [facility]    Glenda Medin, MD  Office - [phone]  [employer] - [phone]  ++++++++++++++++++++++++++++++++++++++++++++++++++++++++++++++  Subjective:  No new complaints    Medications:  Scheduled Meds:  Current Facility-Administered Medications   Medication Dose Route Frequency   . amLODIPine  10 mg Oral Daily   . atorvastatin  40 mg Oral QHS   . insulin glargine  8 Units Subcutaneous QHS   . metoprolol XL  100 mg Oral Daily   . sodium bicarbonate  1,300 mg Oral QID     Continuous Infusions:  . heparin 25,000 units in dextrose 5% 500 mL 13 Units/kg/hr (08/16/16 0227)     PRN Meds:acetaminophen **OR** acetaminophen, Nursing communication: Adult Hypoglycemia Treatment Algorithm **AND** dextrose **AND** dextrose **AND** glucagon (rDNA) **AND** Nursing communication: Document Significant Event Note, hydrALAZINE, insulin lispro, insulin lispro, LORazepam, ondansetron **OR** ondansetron, traMADol    Objective:  Vital signs in last 24 hours:  Temp:  [96.3 F (35.7 C)-97.8 F (36.6 C)] 96.4 F (35.8 C)  Heart Rate:  [59-78] 69  Resp Rate:  [16] 16  BP: (110-142)/(59-86) 110/59  Intake/Output last 24 hours:    Intake/Output Summary (Last 24 hours) at 08/16/16 1116  Last data filed at 08/16/16 0942   Gross per 24 hour   Intake              600 ml   Output             1350 ml   Net             -750 ml     Intake/Output  this shift:  I/O this shift:  In: 200 [P.O.:200]  Out: 500 [Urine:500]    Physical Exam:   Gen: WD WN NAD   CV: S1 S2 N RRR   Chest: CTAB   Ab: ND NT soft no HSM +BS   Ext: No C/E    Labs:    Recent Labs  Lab 08/16/16  0410 08/15/16  1002 08/14/16  1225  08/12/16  0330   Glucose 153* 313* 414* More results in Results Review 160*   BUN 28.0* 26.0* 28.0* More results in Results Review 25.0*   Creatinine 1.5* 1.5* 1.8* More results in Results Review 1.3*   Calcium 8.8 8.5 8.6 More results in Results Review 8.4   Sodium 138 137 135* More results in Results Review 137   Potassium 4.3 4.3 4.7 More results in Results Review 3.8   Chloride 106 104 105 More results in Results Review 111   CO2 22 24 22  More results in Results Review 21*   Phosphorus 4.5  --  4.1  --  3.5   Magnesium 1.7  --  1.8  --  2.1   More results in Results Review = values in this interval not displayed.  Recent Labs  Lab 08/16/16  0410 08/15/16  0821 08/14/16  1225   WBC 8.72 8.35 7.43   Hgb 11.2* 11.4* 12.0   Hematocrit 35.0* 36.0* 37.8   MCV 86.2 87.2 86.1   MCH 27.6* 27.6* 27.3*   MCHC 32.0 31.7* 31.7*   RDW 15 15 15    MPV 10.1 10.1 10.2   Platelets [phone]

## 2016-08-17 ENCOUNTER — Encounter
Admission: EM | Disposition: A | Discharge status: Rehabilitation Facility | Payer: Self-pay | Source: Home / Self Care | Attending: Internal Medicine

## 2016-08-17 LAB — CBC
Absolute NRBC: 0 10*3/uL
Hematocrit: 32.6 % — ABNORMAL LOW (ref 37.0–47.0)
Hgb: 10.4 g/dL — ABNORMAL LOW (ref 12.0–16.0)
MCH: 28 pg (ref 28.0–32.0)
MCHC: 31.9 g/dL — ABNORMAL LOW (ref 32.0–36.0)
MCV: 87.9 fL (ref 80.0–100.0)
MPV: 10.1 fL (ref 9.4–12.3)
Nucleated RBC: 0 /100 WBC (ref 0.0–1.0)
Platelets: 361 10*3/uL (ref 140–400)
RBC: 3.71 10*6/uL — ABNORMAL LOW (ref 4.20–5.40)
RDW: 15 % (ref 12–15)
WBC: 8.93 10*3/uL (ref 3.50–10.80)

## 2016-08-17 LAB — MAGNESIUM: Magnesium: 1.9 mg/dL (ref 1.6–2.6)

## 2016-08-17 LAB — BASIC METABOLIC PANEL
BUN: 33 mg/dL — ABNORMAL HIGH (ref 7.0–19.0)
CO2: 26 mEq/L (ref 22–29)
Calcium: 8.6 mg/dL (ref 7.9–10.2)
Chloride: 107 mEq/L (ref 100–111)
Creatinine: 1.8 mg/dL — ABNORMAL HIGH (ref 0.6–1.0)
Glucose: 186 mg/dL — ABNORMAL HIGH (ref 70–100)
Potassium: 4.1 mEq/L (ref 3.5–5.1)
Sodium: 139 mEq/L (ref 136–145)

## 2016-08-17 LAB — IMMUNOFIXATION ELECTROPHORESIS

## 2016-08-17 LAB — GLUCOSE WHOLE BLOOD - POCT
Whole Blood Glucose POCT: 174 mg/dL — ABNORMAL HIGH (ref 70–100)
Whole Blood Glucose POCT: 193 mg/dL — ABNORMAL HIGH (ref 70–100)
Whole Blood Glucose POCT: 166 mg/dL — ABNORMAL HIGH (ref 70–100)
Whole Blood Glucose POCT: 324 mg/dL — ABNORMAL HIGH (ref 70–100)
Whole Blood Glucose POCT: 251 mg/dL — ABNORMAL HIGH (ref 70–100)

## 2016-08-17 LAB — APTT
PTT: 25 s (ref 23–37)
PTT: 46 s — ABNORMAL HIGH (ref 23–37)

## 2016-08-17 LAB — PHOSPHORUS: Phosphorus: 4.6 mg/dL (ref 2.3–4.7)

## 2016-08-17 LAB — GFR: EGFR: 27.2

## 2016-08-17 SURGERY — LEFT HEART CATH POSS PCI
Anesthesia: Conscious Sedation | Laterality: Left

## 2016-08-17 MED ORDER — ACETAMINOPHEN 325 MG PO TABS
650.0000 mg | ORAL_TABLET | ORAL | Status: DC | PRN
Start: 2016-08-17 — End: 2016-08-21

## 2016-08-17 MED ORDER — SODIUM CHLORIDE 0.9 % IV SOLN
INTRAVENOUS | Status: AC
Start: 2016-08-17 — End: 2016-08-17

## 2016-08-17 MED ORDER — HEPARIN WASH BOWL 5 UNITS/ML SOLN (CATH LAB)
Status: AC
Start: 2016-08-17 — End: 2016-08-17
  Filled 2016-08-17: qty 2000

## 2016-08-17 MED ORDER — FENTANYL CITRATE (PF) 50 MCG/ML IJ SOLN (WRAP)
INTRAMUSCULAR | Status: AC
Start: 2016-08-17 — End: 2016-08-17
  Administered 2016-08-17: 50 ug via INTRAVENOUS
  Filled 2016-08-17: qty 2

## 2016-08-17 MED ORDER — IODIXANOL 320 MG/ML IV SOLN
75.0000 mL | Freq: Once | INTRAVENOUS | Status: AC | PRN
Start: 2016-08-17 — End: 2016-08-17
  Administered 2016-08-17: 75 mL via INTRA_ARTERIAL

## 2016-08-17 MED ORDER — MIDAZOLAM HCL 2 MG/2ML IJ SOLN
INTRAMUSCULAR | Status: AC
Start: 2016-08-17 — End: 2016-08-17
  Administered 2016-08-17: 1 mg via INTRAVENOUS
  Filled 2016-08-17: qty 2

## 2016-08-17 MED ORDER — LIDOCAINE HCL (PF) 1 % IJ SOLN
INTRAMUSCULAR | Status: AC
Start: 2016-08-17 — End: 2016-08-17
  Filled 2016-08-17: qty 30

## 2016-08-17 MED ORDER — SODIUM CHLORIDE 0.9 % IV SOLN
INTRAVENOUS | Status: DC
Start: 2016-08-17 — End: 2016-08-22

## 2016-08-17 NOTE — Brief Op Note (Signed)
PRELIMINARY CATH NOTE  [facility]    Date Time: 08/17/16 8:02 AM    Patient Name:   Glenda Green    Date of Cardiac Cath:   08/17/2016    Providers Performing:   Surgeon(s):  Ty Hilts, MD      Operative Procedure:   LHC, CORS and grafts    Preoperative Diagnosis:   CABG, coronary artery disease and ischemic heart disease    Postoperative Diagnosis:   3/4 grafts patent  CABG, coronary artery disease and ischemic heart disease      Anesthesia:     Fentanyl 50 mcg and versed 1 mg      Stents   * No implants in log *      Findings:   LVEF 55% by echo    LMCA 0    LAD   Proximal         0%        Mid          100%         Distal        0%                         D1          %                   D2               %            CIRC  Proximal         100%         Mid            %       Distal       %                         OM1        0%                   OM2          %  RCA  Proximal         100 %        Mid            %      Distal             %                        RPDA      fills by collaterals         %         RPVB                %    LIMA bridge LAD /D1 patent  SVG-OM patent  SVG RCA occluded    Complications:   none    Plan:     May procees with myxoma resection as planned  BMP in am      Signed by: Ty Hilts, MD  [facility]  NP [place] (8am-5pm)  MD Spectralink [phone] or [phone] (8am-5pm)  Arrhythmia Spectralink [phone] (8am-4:30pm)  After hours, non urgent consult line [phone]  After Hours, urgent consults [phone]

## 2016-08-17 NOTE — Progress Notes (Signed)
HOSPITALIST PROGRESS NOTE    Date Time: 08/17/16 1:15 PM  Patient Name: Glenda Green  Attending Physician: Gildardo Griffes, DO        Subjective:   Patient seen and examined. She is very alert and interactive this morning. AAO x 3. No chest pain, SOB, abdominal pain, N/V/D. No fevers or chills. No headache.    RN at bedside - as part of Trio rounding.    Family updated by me at bedside.     TEE done already - confirms myxoma.     Cath done today - cleared for myxoma resection.    For CT surgery later this week.      Physical Exam:     Vitals:    08/17/16 1133   BP: 130/71   Pulse: 60   Resp: 18   Temp: (!) 96.4 F (35.8 C)   SpO2: 98%       Intake and Output Summary (Last 24 hours) at Date Time    Intake/Output Summary (Last 24 hours) at 08/17/16 1315  Last data filed at 08/17/16 0431   Gross per 24 hour   Intake              200 ml   Output             1600 ml   Net            -1400 ml       General: awake, alert, oriented x 3; no acute distress, mental status is much improved today  HEENT: perrla, eomi, sclera anicteric  oropharynx clear without lesions, mucous membranes moist  Neck: supple, no lymphadenopathy, no thyromegaly, no JVD, no carotid bruits  Cardiovascular: regular rate and rhythm, ++ systolic murmur, rubs or gallops  Lungs: clear to auscultation bilaterally, without wheezing, rhonchi, or rales  Abdomen: soft, non-tender, non-distended; no palpable masses, no hepatosplenomegaly, normoactive bowel sounds, no rebound or guarding  Extremities: no clubbing, cyanosis, or edema, + R groin vascular access incision (C/D/I)  Neuro: cranial nerves grossly intact, moves all extremities, RUE pronator drift, R Hemineglect -- neuro exam improving  Skin: no rashes or lesions noted      Medications:     Current Facility-Administered Medications   Medication Dose Route Frequency   . amLODIPine  10 mg Oral Daily   . atorvastatin  40 mg Oral QHS   . insulin glargine  8 Units Subcutaneous QHS   . metoprolol XL  100  mg Oral Daily         Labs:     Results     Procedure Component Value Units Date/Time    Glucose Whole Blood - POCT [[id number]]  (Abnormal) Collected:  08/17/16 1131     Updated:  08/17/16 1147     POCT - Glucose Whole blood 166 (H) mg/dL     Glucose Whole Blood - POCT [[id number]]  (Abnormal) Collected:  08/17/16 0917     Updated:  08/17/16 0919     POCT - Glucose Whole blood 193 (H) mg/dL     Basic Metabolic Panel [[id number]]  (Abnormal) Collected:  08/17/16 0556    Specimen:  Blood Updated:  08/17/16 0648     Glucose 186 (H) mg/dL      BUN 08.6 (H) mg/dL      Creatinine 1.8 (H) mg/dL      Calcium 8.6 mg/dL      Sodium 578 mEq/L      Potassium 4.1 mEq/L  Chloride 107 mEq/L      CO2 26 mEq/L     Narrative:       Per low intensity heparin protocol.    Magnesium [[id number]] Collected:  08/17/16 0556    Specimen:  Blood Updated:  08/17/16 4098     Magnesium 1.9 mg/dL     Narrative:       Per low intensity heparin protocol.    Phosphorus [[id number]] Collected:  08/17/16 0556    Specimen:  Blood Updated:  08/17/16 5621     Phosphorus 4.6 mg/dL     Narrative:       Per low intensity heparin protocol.    GFR [[id number]] Collected:  08/17/16 0556     Updated:  08/17/16 9629     EGFR 27.2    Narrative:       Per low intensity heparin protocol.    CBC [[id number]]  (Abnormal) Collected:  08/17/16 0556    Specimen:  Blood from Blood Updated:  08/17/16 0629     WBC 8.93 x10 3/uL      Hgb 10.4 (L) g/dL      Hematocrit 01.0 (L) %      Platelets 361 x10 3/uL      RBC 3.71 (L) x10 6/uL      MCV 87.9 fL      MCH 28.0 pg      MCHC 31.9 (L) g/dL      RDW 15 %      MPV 10.1 fL      Nucleated RBC 0.0 /100 WBC      Absolute NRBC 0.00 x10 3/uL     Narrative:       Per low intensity heparin protocol.    Glucose Whole Blood - POCT [[id number]]  (Abnormal) Collected:  08/17/16 0623     Updated:  08/17/16 0625     POCT - Glucose Whole blood 174 (H) mg/dL     APTT [[id number]]  (Abnormal) Collected:  08/16/16 2211     Updated:  08/16/16 2255      PTT 71 (H) sec      APTT Anticoag. Given w/i 48 hrs. heparin    Glucose Whole Blood - POCT [[id number]]  (Abnormal) Collected:  08/16/16 2102     Updated:  08/16/16 2104     POCT - Glucose Whole blood 335 (H) mg/dL     Glucose Whole Blood - POCT [[id number]]  (Abnormal) Collected:  08/16/16 1712     Updated:  08/16/16 1845     POCT - Glucose Whole blood 251 (H) mg/dL     Kappa/Lambda Light Chain, Total [[id number]] Collected:  08/15/16 1219     Updated:  08/16/16 1735     Kappa, Quantitative 205 mg/dL      Lambda, Quantitative 136 mg/dL      Kappa/Lambda Ratio 1.51    APTT [[id number]]  (Abnormal) Collected:  08/16/16 1612     Updated:  08/16/16 1702     PTT 53 (H) sec      APTT Anticoag. Given w/i 48 hrs. heparin            Radiology:     Radiology Results (24 Hour)     ** No results found for the last 24 hours. **            Assessment:     Patient Active Problem List   Diagnosis   . Hydronephrosis of right kidney   . Cerebrovascular accident (CVA), unspecified mechanism   .  Atrial myxoma   . Bilateral hydronephrosis   . UTI (urinary tract infection)   . AKI (acute kidney injury)         Plan:   # Left MCA Syndrome with ACUTE CVA  - Continue IV Heparin gtt (without Bolus) - as instructed by Neurology - can be resumed after cardiac cath  - Cath done today - cleared for surgery  - Continue Lipitor 40mg   - TEE confirms: Large RA mass taking up most or FRA cavity but not obstructing or prolapsing across the TV orifice. Size: at least 5.5 cm in height and 3.5 cm in width.  - Continue on cardiac monitor  - Noted MRI/MRA results    # R Atrial Myxoma  - Noted on Echo and confirmed on TEE  - TEE confirms: Large RA mass taking up most or FRA cavity but not obstructing or prolapsing across the TV orifice. Size: at least 5.5 cm in height and 3.5 cm in width.  - Appreciate Cardiology recommendations  - CT Chest/Abdomen/Pelvis noted  - CT Surgery - for OR later this week    # AKI  - Renal function worsening today - repeat Renal  US shows decompressed bladder (s/p Foley)  - Keep Foley in for now  - Discussed with urology - no plans for any acute intervention  - Appreciate Nephrology input - Nephro sending out few new labs / work up  - Continue IVFs in view of worsening renal function    # Bilateral Hydronephrosis   - Continue Foley as per discussion with urology    # UTI  - Continue IV Ceftriaxone for now -- will most likely complete her course here prior to discharge  - Noted urine cultures    # HTN  - Continue Metoprolol 100mg    - Continue Amlodipine 10mg   - Needs good and aggressive BP control -- well controlled on the new regimen  - Continue to monitor BP    # DM2  - Continue SSI      DVT Prophylaxis: SQ Heparin  Diet: Cardiac Diet  Anticipated discharge disposition and date:  Later this week (end of week)    Signed by: Gildardo Griffes, D.O.   cc:Pcp, Noneorunknown, MD

## 2016-08-17 NOTE — Progress Notes (Signed)
Pt. Denies any pain or other  Discomfort,  Pt. Received BP medications , report given to [facility]  RN for room 619. The  Access  Site is  Dry and intact.  V/s are in normal limitno distress noted.

## 2016-08-17 NOTE — Progress Notes (Signed)
H&P Update with ASA/MALLAMPATTI     Date Time: 08/17/16 6:50 AM    PROCEDURE   LHC with grafts    MALLAMPATTI AIRWAY CLASSIFICATION (2)      CLASS I- Full visibility of tonsils, uvula, and soft palate     CLASS II- Visibility of hard and soft palate, upper portion of tonsils and uvula  CLASS III-Soft and hard palate and base of uvula are visible    CLASS IV- Only hard palate visible      ASA PHYSICAL STATUS (3)     ASA 1   HEALTHY PATIENT  ASA 2   MILD SYSTEMIC ILLNESS  ASA 3   SYSTEMIC DISEASE, NOT INCAPACITATING  ASA 4   SEVERE SYSTEMIC DISEASE, IS CONSTANT THREAT TO LIFE  ASA 5   MORIBUND CONDITION, NOT EXPECTED TO LIVE >24 HOURS           IRRESPECTIVE OF PROCEDURE  E           EMERGENCY PROCEDURE       CONCLUSION:     The history and physical currently available in the chart has been reviewed and there are no significant interval changes since prior evaluation.  She has no complaints.  She was seen and examined by me prior to the procedure.  The risks, benefits and alternatives of the procedure have been discussed in detail and she has indicated that she understands the procedure, indications, and risks inherent to the procedure and is amenable to proceeding.  All questions were answered. Informed consent was signed and verified.        Signed by: Ty Hilts, MD

## 2016-08-17 NOTE — Progress Notes (Signed)
Received report and patient into IC[facility] 17 s/p stable, v/s are in normal limit,  Pt, denies any pain or other  Discomfort, the rt. Groin site access area is intact And  Dry dressing,  No bleeding or hematoma noted T=97.5. Assumed patient's care. ID band verified, alert and oriented x 3, VSS, aldrete 10, pain0/10, rhythm SR.  Oriented to room and call bell. Call bell within reach.

## 2016-08-17 NOTE — Plan of Care (Signed)
Problem: Pain  Goal: Pain at adequate level as identified by patient  Outcome: Progressing  Pt A&OX3. Speech clear. Follows commands. Able to move all extremities. VSS. Denies any pain or discomfort. Continues on heparin drip. Next aptt at 10am. NPO after midnight for cath. Foley intact. Call bell within reach. Will continue to monitor.

## 2016-08-17 NOTE — Plan of Care (Signed)
Pt was off unit for procedure at beginning of shift. Received pt @1100 . Pt A/Ox4, very pleasant. MAE, limited movement noted in RUE d/t shoulder, FC. Speech clear. Pt denies any pain or discomfort on assessment and reassessment. On room air, no complaints of SOB. NSR on tele. Procedure site (R-groin). Assessed, dressing C/D/I. No signs of bleeding. BLE pluses assessed as well. RUE midline in place. Family at bedside, MDR completed, awaiting decision on date/time for surgery and NM kidney flow/function procedure. PRN insulin coverage and tylenol given later in the shift. Heparin restarted per telephone order from Dr Tery Sanfilippo at 12units/kg/hr. APTT@14 :49 was 25, to be rechecked at 20:49. Safety measures in place, will continue to monitor.

## 2016-08-17 NOTE — OT Progress Note (Signed)
Occupational Therapy Cancellation Note    Patient: Glenda Green  ZOX:[id number]    Unit: J811/B147.82    Patient not seen for occupational therapy secondary to off the floor for test.     Ander Purpura, MA/OTR, CSRS, LSVT-BIG  [phone]    Time 9:00

## 2016-08-17 NOTE — Progress Notes (Signed)
Pt  Transferred into ICAR room 9  here for left heart cath with Dr Tery Sanfilippo.  Oriented to unit and plan of care. Call bell at bedside.  Assessment and pre procedure admission requirements completed.     Pre and post  procedure instructions reviewed with pt    Heparin gtt discontinued per order from Dr Tery Sanfilippo

## 2016-08-17 NOTE — Progress Notes (Signed)
[facility], [employer].  Progress Note    Assessment:  1.  Acute on CRF; multifactorial etiology; fluctuating serum creatinine  2.  R/o right atrial myxoma/PFO with ? Paradoxical embolus -> CVA  3.  MMPs    Plan:  1. Restart NS @ 60cc/hr  2.  Lasix renal scan to assess patency of right ureter  3.  Continue current medication regimen  4.  qam labs to include Mg/PO4  5.  Cardiac surgery assessment    Further recommendations to follow    Thank you    Louellen Molder, MD  Office - [phone]  [employer] - [phone]  ++++++++++++++++++++++++++++++++++++++++++++++++++++++++++++++  Subjective:  No new complaints    Medications:  Scheduled Meds:  Current Facility-Administered Medications   Medication Dose Route Frequency   . amLODIPine  10 mg Oral Daily   . atorvastatin  40 mg Oral QHS   . insulin glargine  8 Units Subcutaneous QHS   . metoprolol XL  100 mg Oral Daily     Continuous Infusions:  . sodium chloride     . heparin 25,000 units in dextrose 5% 500 mL 12 Units/kg/hr (08/17/16 1502)     PRN Meds:acetaminophen **OR** acetaminophen, acetaminophen, Nursing communication: Adult Hypoglycemia Treatment Algorithm **AND** dextrose **AND** dextrose **AND** glucagon (rDNA) **AND** Nursing communication: Document Significant Event Note, hydrALAZINE, insulin lispro, insulin lispro, LORazepam, ondansetron **OR** ondansetron, traMADol    Objective:  Vital signs in last 24 hours:  Temp:  [96.2 F (35.7 C)-98.8 F (37.1 C)] 96.8 F (36 C)  Heart Rate:  [60-88] 64  Resp Rate:  [16-18] 18  BP: (123-177)/(62-82) 123/73  Intake/Output last 24 hours:    Intake/Output Summary (Last 24 hours) at 08/17/16 1720  Last data filed at 08/17/16 0431   Gross per 24 hour   Intake              200 ml   Output             1600 ml   Net            -1400 ml     Intake/Output this shift:  No intake/output data recorded.    Physical Exam:   Gen: Alert/pleasant   CV: S1 S2 N RRR   Chest: No rales/no rhonchi   Ab: ND NT soft no  HSM +BS   Ext: No edema    Labs:    Recent Labs  Lab 08/17/16  0556 08/16/16  0410 08/15/16  1002 08/14/16  1225   Glucose 186* 153* 313* 414*   BUN 33.0* 28.0* 26.0* 28.0*   Creatinine 1.8* 1.5* 1.5* 1.8*   Calcium 8.6 8.8 8.5 8.6   Sodium 139 138 137 135*   Potassium 4.1 4.3 4.3 4.7   Chloride 107 106 104 105   CO2 26 22 24 22    Phosphorus 4.6 4.5  --  4.1   Magnesium 1.9 1.7  --  1.8       Recent Labs  Lab 08/17/16  0556 08/16/16  0410 08/15/16  0821   WBC 8.93 8.72 8.35   Hgb 10.4* 11.2* 11.4*   Hematocrit 32.6* 35.0* 36.0*   MCV 87.9 86.2 87.2   MCH 28.0 27.6* 27.6*   MCHC 31.9* 32.0 31.7*   RDW 15 15 15    MPV 10.1 10.1 10.1   Platelets 361 360 379

## 2016-08-17 NOTE — UM Notes (Signed)
Stroke: Ischemic - Care Day 9 (08/17/2016)      08/09/16 1459  Admit to Inpatient Once         Continued stay review for 9/25:  Inpt floor  Telemetry bed    Medicine progress note:    Subjective:   Patient seen and examined. She is very alert and interactive this morning. AAO x 3. No chest pain, SOB, abdominal pain, N/V/D. No fevers or chills. No headache.    RN at bedside - as part of Trio rounding.    Family updated by me at bedside.     TEE done already - confirms myxoma.     Cath done today - cleared for myxoma resection.    For CT surgery later this week.  Vitals:    08/17/16 1133   BP: 130/71   Pulse: 60   Resp: 18   Temp: (!) 96.4 F (35.8 C)   SpO2: 98%          Intake and Output Summary (Last 24 hours) at Date Time    Intake/Output Summary (Last 24 hours) at 08/17/16 1315  Last data filed at 08/17/16 0431   Gross per 24 hour   Intake              200 ml   Output             1600 ml   Net            -1400 ml     Basic Metabolic Panel [[id number]]  (Abnormal) Collected:  08/17/16 0556     Specimen:  Blood Updated:  08/17/16 0648     Glucose 186 (H) mg/dL      BUN 21.3 (H) mg/dL      Creatinine 1.8 (H) mg/dL      Assessment:         Patient Active Problem List   Diagnosis   . Hydronephrosis of right kidney   . Cerebrovascular accident (CVA), unspecified mechanism   . Atrial myxoma   . Bilateral hydronephrosis   . UTI (urinary tract infection)   . AKI (acute kidney injury)         Plan:   # Left MCA Syndrome with ACUTE CVA  - Continue IV Heparin gtt (without Bolus) - as instructed by Neurology - can be resumed after cardiac cath  - Cath done today - cleared for surgery  - Continue Lipitor 40mg   - TEE confirms: Large RA mass taking up most or FRA cavity but not obstructing or prolapsing across the TV orifice. Size: at least 5.5 cm in height and 3.5 cm in width.  - Continue on cardiac monitor  - Noted MRI/MRA results    # R Atrial Myxoma  - Noted on Echo and confirmed on TEE  - TEE  confirms: Large RA mass taking up most or FRA cavity but not obstructing or prolapsing across the TV orifice. Size: at least 5.5 cm in height and 3.5 cm in width.  - Appreciate Cardiology recommendations  - CT Chest/Abdomen/Pelvis noted  - CT Surgery - for OR later this week    # AKI  - Renal function worsening today - repeat Renal US shows decompressed bladder (s/p Foley)  - Keep Foley in for now  - Discussed with urology - no plans for any acute intervention  - Appreciate Nephrology input - Nephro sending out few new labs / work up  - Continue IVFs in view of worsening renal function    #  Bilateral Hydronephrosis   - Continue Foley as per discussion with urology    # UTI  - Continue IV Ceftriaxone for now -- will most likely complete her course here prior to discharge  - Noted urine cultures    # HTN  - Continue Metoprolol 100mg    - Continue Amlodipine 10mg   - Needs good and aggressive BP control -- well controlled on the new regimen  - Continue to monitor BP    # DM2  - Continue SSI      DVT Prophylaxis: SQ Heparin  Diet: Cardiac Diet  Anticipated discharge disposition and date:  Later this week (end of week)      Meds:  Scheduled Meds:  Current Facility-Administered Medications   Medication Dose Route Frequency   . amLODIPine  10 mg Oral Daily   . atorvastatin  40 mg Oral QHS   . insulin glargine  8 Units Subcutaneous QHS   . metoprolol XL  100 mg Oral Daily     Continuous Infusions:  . sodium chloride 60 mL/hr at 08/17/16 1735   . heparin 25,000 units in dextrose 5% 500 mL 12 Units/kg/hr (08/17/16 1502)       ___________________________________  Zenia Resides, RN BSN  UR Case Manager  Case Management Department  [facility]  [phone]

## 2016-08-17 NOTE — Progress Notes (Signed)
Handoff completed at the bedside. Right groin remains CDI, soft, no hematoma or oozing. Pulses +. Handoff completed with bedside RN.

## 2016-08-18 ENCOUNTER — Inpatient Hospital Stay: Payer: Medicare Other

## 2016-08-18 LAB — CBC
Absolute NRBC: 0 10*3/uL
Hematocrit: 32.2 % — ABNORMAL LOW (ref 37.0–47.0)
Hgb: 10 g/dL — ABNORMAL LOW (ref 12.0–16.0)
MCH: 27.3 pg — ABNORMAL LOW (ref 28.0–32.0)
MCHC: 31.1 g/dL — ABNORMAL LOW (ref 32.0–36.0)
MCV: 88 fL (ref 80.0–100.0)
MPV: 10.3 fL (ref 9.4–12.3)
Nucleated RBC: 0 /100 WBC (ref 0.0–1.0)
Platelets: 361 10*3/uL (ref 140–400)
RBC: 3.66 10*6/uL — ABNORMAL LOW (ref 4.20–5.40)
RDW: 15 % (ref 12–15)
WBC: 10.28 10*3/uL (ref 3.50–10.80)

## 2016-08-18 LAB — GLUCOSE WHOLE BLOOD - POCT
Whole Blood Glucose POCT: 149 mg/dL — ABNORMAL HIGH (ref 70–100)
Whole Blood Glucose POCT: 356 mg/dL — ABNORMAL HIGH (ref 70–100)
Whole Blood Glucose POCT: 201 mg/dL — ABNORMAL HIGH (ref 70–100)
Whole Blood Glucose POCT: 287 mg/dL — ABNORMAL HIGH (ref 70–100)
Whole Blood Glucose POCT: 285 mg/dL — ABNORMAL HIGH (ref 70–100)

## 2016-08-18 LAB — GFR: EGFR: 31.2

## 2016-08-18 LAB — IFE REVIEW, SERUM

## 2016-08-18 LAB — BASIC METABOLIC PANEL
BUN: 32 mg/dL — ABNORMAL HIGH (ref 7.0–19.0)
CO2: 22 mEq/L (ref 22–29)
Calcium: 8.7 mg/dL (ref 7.9–10.2)
Chloride: 109 mEq/L (ref 100–111)
Creatinine: 1.6 mg/dL — ABNORMAL HIGH (ref 0.6–1.0)
Glucose: 104 mg/dL — ABNORMAL HIGH (ref 70–100)
Potassium: 4.4 mEq/L (ref 3.5–5.1)
Sodium: 141 mEq/L (ref 136–145)

## 2016-08-18 LAB — APTT
PTT: 43 s — ABNORMAL HIGH (ref 23–37)
PTT: 80 s — ABNORMAL HIGH (ref 23–37)
PTT: 68 s — ABNORMAL HIGH (ref 23–37)

## 2016-08-18 LAB — URINE IMMUNOFIXATION

## 2016-08-18 LAB — MAGNESIUM: Magnesium: 1.7 mg/dL (ref 1.6–2.6)

## 2016-08-18 LAB — IFE REVIEW, URINE

## 2016-08-18 LAB — PHOSPHORUS: Phosphorus: 4.8 mg/dL — ABNORMAL HIGH (ref 2.3–4.7)

## 2016-08-18 MED ORDER — FUROSEMIDE 10 MG/ML IJ SOLN
0.6080 mg/kg | Freq: Once | INTRAMUSCULAR | Status: DC | PRN
Start: 2016-08-18 — End: 2016-08-23

## 2016-08-18 MED ORDER — TECHNETIUM TC 99M MERTIATIDE
12.5000 | Freq: Once | Status: AC | PRN
Start: 2016-08-18 — End: 2016-08-18
  Administered 2016-08-18: 13 via INTRAVENOUS
  Filled 2016-08-18: qty 15

## 2016-08-18 NOTE — Plan of Care (Addendum)
Problem: Safety  Goal: Patient will be free from injury during hospitalization  Outcome: Progressing  Pt A&OX3. Speech clear. Follows commands. Able to move all extremities. VSS. Denies any pain or discomfort. Right groin dressing C/D/I. Continues on heparin drip. Next aptt at 0300. Foley intact. Call bell within reach. Will continue to monitor.       Pt Aptt 43. MD notified. No bolus to be given. Just rate change. Next aptt at 0900

## 2016-08-18 NOTE — PT Progress Note (Signed)
Physical Therapy Note    [facility]   Physical Therapy Treatment  Patient:  Glenda [name] MRN#:  [id number]  Unit: [facility] TOWER 6 EAST  Bed: F619/F619.01    Discharge Recommendations:   D/C Recommendations: SNF   DME Recommendations: In place    If SNF recommended discharge disposition is not available, patient will need contact guard assist for safety with functional mobility, in place equipment, and HHPT.        Assessment:   Patient currently requires standby assist/contact guard assist for ambulation with a front wheeled walker. Fatigued with mobility and therex.  Patient presents with Decreased functional mobility, impaired gait, decreased balance, and decreased activity tolerance. Patient would benefit from PT services to maximize independence and safety with functional mobility prior to discharging to the next level of care as recommended above.     Treatment Activities: gait training, functional mobility training, therex, assistive device training    Educated the patient to role of physical therapy, plan of care, goals of therapy and HEP, safety with mobility and ADLs, energy conservation techniques, pursed lip breathing.    Plan:   PT Frequency: 2-3x/wk    Continue plan of care.       Precautions and Contraindications:   Falls  Activity: up as tolerated    Updated Medical Status/Imaging/Labs:     Recent Labs  Lab 08/18/16  0309   WBC 10.28   Hgb 10.0*   Hematocrit 32.2*   Platelets 361     Subjective:   Patient's medical condition is appropriate for Physical Therapy intervention at this time.  Patient is agreeable to participation in the therapy session. Nursing clears patient for therapy.    Pain:   Scale: 0/10    "I'm really tired."    Objective:   Patient received in bed with PIV access, midline, telemetry, no SCD's, bed alarm on in place.    Cognition  Level of alertness: alert, cooperative, appropriate responses to questions   Orientation: to person, place, time,  situation  Follows commands: one-step, 100% of time,  without difficulty     Functional Mobility  Rolling: modified independent   Supine to Sit: modified independent   Scooting: independent scooting edge of bed   Sit to Stand: standby assist with front wheeled walker   Stand to Sit: standby assist with front wheeled walker   Transfers: standby assist with front wheeled walker     Ambulation  Level of Assistance required: standby assist/contact guard assist   Ambulation Distance: 100 feet  Pattern: decreased speed, decreased step length, no overt loss of balance noted, trunk flexion  Device Used: front wheeled walker   Weightbearing Status: no restrictions   Stair Management: NT    Balance  Static Sitting: independent   Dynamic Sitting: independent   Static Standing: standby assist with front wheeled walker   Dynamic Standing: contact guard assist with front wheeled walker     Therapeutic Exercises  Ankle pumps, LAQ, hip flexion    Patient Participation: good  Patient Endurance: fair+    Patient left in bed with call bell within reach, all needs met and all questions answered, bed alarm on, fall mat in place, no SCDs in place as found, RN notified of session outcome and patient response.     Goals:  Goals  Goal Formulation: With patient/family  Time for Goal Acheivement: 5 visits  Goals: Select goal  Pt Will Go Supine To Sit: with supervision, to maximize  functional mobility and independence  Pt Will Sit Edge of Bed: 3-5 min, with supervision, to maximize functional mobility and independence (without LOB and neutral sitting balance)  Pt Will Ambulate: 51-100 feet, with rolling walker, with stand by assist, to maximize functional mobility and independence  Pt Will Go Up / Down Stairs: 3-5 stairs, with minimal assist, With rail, to maximize functional mobility and independence    Lorna Few, PT, DPT  [phone]     Time of Treatment:  PT Received On: 08/18/16  Start Time: 1535  Stop Time: 1605  Time Calculation (min):  30 min  Treatment # 2 out of 5 visits

## 2016-08-18 NOTE — Progress Notes (Signed)
HOSPITALIST PROGRESS NOTE    Date Time: 08/18/16 3:36 PM  Patient Name: Glenda [name]  Attending Physician: Gildardo Griffes, DO        Subjective:   Patient seen and examined. She is very alert and interactive this morning. AAO x 3. No chest pain, SOB, abdominal pain, N/V/D. No fevers or chills. No headache.    RN at bedside - as part of Trio rounding.    Family updated by me at bedside.     TEE done already - confirms myxoma.     Cath done today - cleared for myxoma resection.    Had Renal Lasix done today    For CT surgery this Friday (as per documentation).      Physical Exam:     Vitals:    08/18/16 1136   BP: 115/73   Pulse: 66   Resp: 17   Temp: (!) 96.9 F (36.1 C)   SpO2: 97%       Intake and Output Summary (Last 24 hours) at Date Time    Intake/Output Summary (Last 24 hours) at 08/18/16 1536  Last data filed at 08/18/16 1000   Gross per 24 hour   Intake                0 ml   Output             2375 ml   Net            -2375 ml       General: awake, alert, oriented x 3; no acute distress, mental status is much improved today  HEENT: perrla, eomi, sclera anicteric  oropharynx clear without lesions, mucous membranes moist  Neck: supple, no lymphadenopathy, no thyromegaly, no JVD, no carotid bruits  Cardiovascular: regular rate and rhythm, ++ systolic murmur, rubs or gallops  Lungs: clear to auscultation bilaterally, without wheezing, rhonchi, or rales  Abdomen: soft, non-tender, non-distended; no palpable masses, no hepatosplenomegaly, normoactive bowel sounds, no rebound or guarding  Extremities: no clubbing, cyanosis, or edema, + R groin vascular access incision (C/D/I)  Neuro: cranial nerves grossly intact, moves all extremities, RUE pronator drift, R Hemineglect -- neuro exam improving  Skin: no rashes or lesions noted      Medications:     Current Facility-Administered Medications   Medication Dose Route Frequency   . amLODIPine  10 mg Oral Daily   . atorvastatin  40 mg Oral QHS   . insulin glargine   8 Units Subcutaneous QHS   . metoprolol XL  100 mg Oral Daily         Labs:     Results     Procedure Component Value Units Date/Time    Glucose Whole Blood - POCT [[id number]]  (Abnormal) Collected:  08/18/16 1135     Updated:  08/18/16 1421     POCT - Glucose Whole blood 356 (H) mg/dL     APTT [[id number]]  (Abnormal) Collected:  08/18/16 0856     Updated:  08/18/16 0928     PTT 80 (H) sec      APTT Anticoag. Given w/i 48 hrs. heparin    Glucose Whole Blood - POCT [[id number]]  (Abnormal) Collected:  08/18/16 0758     Updated:  08/18/16 0818     POCT - Glucose Whole blood 149 (H) mg/dL     APTT [[id number]]  (Abnormal) Collected:  08/18/16 0309     Updated:  08/18/16 0426     PTT 43 (H)  sec      APTT Anticoag. Given w/i 48 hrs. heparin    Narrative:       In the AM, If pre procedural GFR less than 60.  Per low intensity heparin protocol.    Phosphorus [[id number]]  (Abnormal) Collected:  08/18/16 0309    Specimen:  Blood Updated:  08/18/16 0403     Phosphorus 4.8 (H) mg/dL     Narrative:       In the AM, If pre procedural GFR less than 60.  Per low intensity heparin protocol.    Basic metabolic panel [[id number]]  (Abnormal) Collected:  08/18/16 0309    Specimen:  Blood Updated:  08/18/16 0403     Glucose 104 (H) mg/dL      BUN 78.2 (H) mg/dL      Creatinine 1.6 (H) mg/dL      Calcium 8.7 mg/dL      Sodium 956 mEq/L      Potassium 4.4 mEq/L      Chloride 109 mEq/L      CO2 22 mEq/L     Narrative:       In the AM, If pre procedural GFR less than 60.  Per low intensity heparin protocol.    GFR [[id number]] Collected:  08/18/16 0309     Updated:  08/18/16 0403     EGFR 31.2    Narrative:       In the AM, If pre procedural GFR less than 60.  Per low intensity heparin protocol.    Magnesium [[id number]] Collected:  08/18/16 0309    Specimen:  Blood Updated:  08/18/16 0403     Magnesium 1.7 mg/dL     Narrative:       In the AM, If pre procedural GFR less than 60.  Per low intensity heparin protocol.    CBC [[id number]]   (Abnormal) Collected:  08/18/16 0309    Specimen:  Blood from Blood Updated:  08/18/16 0344     WBC 10.28 x10 3/uL      Hgb 10.0 (L) g/dL      Hematocrit 27.2 (L) %      Platelets 361 x10 3/uL      RBC 3.66 (L) x10 6/uL      MCV 88.0 fL      MCH 27.3 (L) pg      MCHC 31.1 (L) g/dL      RDW 15 %      MPV 10.3 fL      Nucleated RBC 0.0 /100 WBC      Absolute NRBC 0.00 x10 3/uL     Narrative:       In the AM, If pre procedural GFR less than 60.  Per low intensity heparin protocol.    APTT [[id number]]  (Abnormal) Collected:  08/17/16 2043     Updated:  08/17/16 2123     PTT 46 (H) sec      APTT Anticoag. Given w/i 48 hrs. heparin    Glucose Whole Blood - POCT [[id number]]  (Abnormal) Collected:  08/17/16 2103     Updated:  08/17/16 2123     POCT - Glucose Whole blood 251 (H) mg/dL     Glucose Whole Blood - POCT [[id number]]  (Abnormal) Collected:  08/17/16 1631     Updated:  08/17/16 1704     POCT - Glucose Whole blood 324 (H) mg/dL             Radiology:     Radiology Results (  24 Hour)     Procedure Component Value Units Date/Time    NM Kidney W Flow And Function Doylene Canard [[id number]] Resulted:  08/18/16 1345    Order Status:  Sent Updated:  08/18/16 1347            Assessment:     Patient Active Problem List   Diagnosis   . Hydronephrosis of right kidney   . Cerebrovascular accident (CVA), unspecified mechanism   . Atrial myxoma   . Bilateral hydronephrosis   . UTI (urinary tract infection)   . AKI (acute kidney injury)         Plan:   # Left MCA Syndrome with ACUTE CVA  - Continue IV Heparin gtt (without Bolus) - as instructed by Neurology - until CT surgery this Fri  - Cath done - cleared for surgery  - Continue Lipitor 40mg   - TEE confirms: Large RA mass taking up most or FRA cavity but not obstructing or prolapsing across the TV orifice. Size: at least 5.5 cm in height and 3.5 cm in width.  - Continue on cardiac monitor  - Noted MRI/MRA results    # R Atrial Myxoma  - Noted on Echo and confirmed on TEE  - TEE  confirms: Large RA mass taking up most or FRA cavity but not obstructing or prolapsing across the TV orifice. Size: at least 5.5 cm in height and 3.5 cm in width.  - Appreciate Cardiology recommendations  - CT Chest/Abdomen/Pelvis noted  - CT Surgery - for OR later this Fri    # AKI  - Renal function improving today - repeat Renal US shows decompressed bladder (s/p Foley)  - Keep Foley in for now  - Discussed with urology - no plans for any acute intervention  - Appreciate Nephrology input   - Continue IVFs for now (managed by Nephrology)     # Bilateral Hydronephrosis   - Continue Foley as per discussion with urology    # UTI  - Completed course of IV Ceftriaxone     # HTN  - Continue Metoprolol 100mg    - Continue Amlodipine 10mg   - Needs good and aggressive BP control -- well controlled on the new regimen  - Continue to monitor BP    # DM2  - Continue SSI      DVT Prophylaxis: SQ Heparin  Diet: Cardiac Diet  Anticipated discharge disposition and date:  Later this week (end of week)    Signed by: Gildardo Griffes, D.O.   cc:Pcp, Noneorunknown, MD

## 2016-08-18 NOTE — Progress Notes (Signed)
[facility] Consult Progress Note    Date Time: 08/18/16   Patient Name: Glenda [name]  Outpatient Neurologist: n/[name]    CC: difficulty speaking    Assessment:   80 yo F visiting from [place] with h/o DM, HTN, HLD, on ASA 81mg , recurrent UTIs s/p renal stent 08/05/16 (was off ASA for it), found aphasic on AM of 08/08/16. Partial L MCA syndrome on arrival to [facility], with NIHSS 10. Was out of window for IV-tPA. No LVO on CTA.     1. Acute ischemic stroke - small acute infarct in L parietal cortex (MCA territory) without hemorrhage       Mechanism of stroke is embolic - suspected to be cardioembolic:      TTE with possible myxoma --> TEE with large myxoma with [name] small atrial shunt        LDL 105, A1c 7.7%     Aphasia, mild R hemiparesis, L gaze preference have all resolved     2. Recent renal stent 08/05/16    3. H/o DM, HTN, HLD    Recommendations:   - She is neurologically cleared to undergo CT surgery (planned for later this week). She is already 9 days post stroke (small infarct) and risk of hemorrhagic conversion by time of surgery would be exceedingly low.   - Continue statin for secondary stroke prevention.  O.K. to hold Plavix for cardiac surgery. Continue low intensity heparin drip (no bolus) while awaiting cardiac surgery to prevent further stroke  - Continue medical management by primary team  - Supportive therapies:  PT/OT/SLP.     NEUROLOGY ATTENDING NOTE:  I have reviewed the history, imaging and pertinent test results. I have NOT personally examined the patient today, but have discussed case with mid-level and agree with the assessment and plan as outlined in this note, with any additional considerations as noted below.     - Patient not personally seen today. Discussed with midlevel.   - Remains neurologically stable. No new symptoms. Exam unchanged, essentially intact from neuro standpoint.   - Awaiting remainder of pre-op preparation. Per chart there are plans for surgery on Friday.     Will continue to  follow at intervals.  Please call with any questions/concerns - Neurology Spectralink [phone].    Glenda Form, MD  Neurohospitalist, [facility], Spectralink [phone]  Neurology Consults: Days- Spectralink [phone] (8a-4:30p), [phone]    Interval History/Subjective:   No new events overnight.   Pt states that she is feeling well, just wondering when her surgery will be. She denies speech issues, headaches, N/V, visual changes, dizziness, weakness or paresthesias.    Medications:     Current Facility-Administered Medications   Medication Dose Route Frequency   . amLODIPine  10 mg Oral Daily   . atorvastatin  40 mg Oral QHS   . insulin glargine  8 Units Subcutaneous QHS   . metoprolol XL  100 mg Oral Daily       Review of Systems:   All systems reviewed and are negative, except for those mentioned above.     Physical Exam:   Temp:  [96.1 F (35.6 C)-97.5 F (36.4 C)] 97 F (36.1 C)  Heart Rate:  [60-88] 71  Resp Rate:  [16-18] 18  BP: (123-177)/(64-82) 147/78    Vital Signs: Reviewed.    General: well developed, well nourished elderly woman in no acute distress. Cooperative with exam.   Extremities: no pedal edema, extremities normal in color    Mental Status:  Patient was sleeping but easily woke up to voice. She was oriented to person, place, and time.  Affect is normal  Fund of knowledge is intact  Recent and remote memory appear intact  Language function is normal.  There is no evidence of aphasia and conversational speech    Cranial nerves:   -CN III, IV, VI: Gaze midline, conjugate. No gaze preference. EOMI.   -CN V: Facial sensation intact   -CN VII: Left nasolabial fold flattening  -CN VIII: Hearing intact to conversational speech.  -CN IX, X: Normal phonation  -CN XI: Symmetric strength of trapezius muscles  -CN XII: Tongue protrudes midline.    Motor: Muscle bulk normal. Muscle tone normal. RUE pronator drift. Strength seems to be 5/5 throughout.    Sensory: Light touch intact    Reflexes:  Toes downgoing on right.  Patient's left great toe is absent. DTRs 0 in BLE and 1+in BUE.     Coordination: FTN intact. No tremors.     Gait: Deferred    Labs:   Labs reviewed. Managed by primary team.     Results     Procedure Component Value Units Date/Time    Glucose Whole Blood - POCT [[id number]]  (Abnormal) Collected:  08/18/16 0758     Updated:  08/18/16 0818     POCT - Glucose Whole blood 149 (H) mg/dL     APTT [[id number]]  (Abnormal) Collected:  08/18/16 0309     Updated:  08/18/16 0426     PTT 43 (H) sec      APTT Anticoag. Given w/i 48 hrs. heparin    Narrative:       In the AM, If pre procedural GFR less than 60.  Per low intensity heparin protocol.    Phosphorus [[id number]]  (Abnormal) Collected:  08/18/16 0309    Specimen:  Blood Updated:  08/18/16 0403     Phosphorus 4.8 (H) mg/dL     Narrative:       In the AM, If pre procedural GFR less than 60.  Per low intensity heparin protocol.    Basic metabolic panel [[id number]]  (Abnormal) Collected:  08/18/16 0309    Specimen:  Blood Updated:  08/18/16 0403     Glucose 104 (H) mg/dL      BUN 01.0 (H) mg/dL      Creatinine 1.6 (H) mg/dL      Calcium 8.7 mg/dL      Sodium 272 mEq/L      Potassium 4.4 mEq/L      Chloride 109 mEq/L      CO2 22 mEq/L     Narrative:       In the AM, If pre procedural GFR less than 60.  Per low intensity heparin protocol.    GFR [[id number]] Collected:  08/18/16 0309     Updated:  08/18/16 0403     EGFR 31.2    Narrative:       In the AM, If pre procedural GFR less than 60.  Per low intensity heparin protocol.    Magnesium [[id number]] Collected:  08/18/16 0309    Specimen:  Blood Updated:  08/18/16 0403     Magnesium 1.7 mg/dL     Narrative:       In the AM, If pre procedural GFR less than 60.  Per low intensity heparin protocol.    CBC [[id number]]  (Abnormal) Collected:  08/18/16 0309    Specimen:  Blood from Blood Updated:  08/18/16 0344  WBC 10.28 x10 3/uL      Hgb 10.0 (L) g/dL      Hematocrit 54.0 (L) %      Platelets 361  x10 3/uL      RBC 3.66 (L) x10 6/uL      MCV 88.0 fL      MCH 27.3 (L) pg      MCHC 31.1 (L) g/dL      RDW 15 %      MPV 10.3 fL      Nucleated RBC 0.0 /100 WBC      Absolute NRBC 0.00 x10 3/uL     Narrative:       In the AM, If pre procedural GFR less than 60.  Per low intensity heparin protocol.    APTT [[id number]]  (Abnormal) Collected:  08/17/16 2043     Updated:  08/17/16 2123     PTT 46 (H) sec      APTT Anticoag. Given w/i 48 hrs. heparin    Glucose Whole Blood - POCT [[id number]]  (Abnormal) Collected:  08/17/16 2103     Updated:  08/17/16 2123     POCT - Glucose Whole blood 251 (H) mg/dL     Glucose Whole Blood - POCT [[id number]]  (Abnormal) Collected:  08/17/16 1631     Updated:  08/17/16 1704     POCT - Glucose Whole blood 324 (H) mg/dL     Immunofixation electrophoresis, Serum [[id number]] Collected:  08/15/16 1219    Specimen:  Blood Updated:  08/17/16 1527     IFE Serum Interp See IFE Review    APTT [[id number]] Collected:  08/17/16 1449     Updated:  08/17/16 1509     PTT 25 sec      APTT Anticoag. Given w/i 48 hrs. heparin    Glucose Whole Blood - POCT [[id number]]  (Abnormal) Collected:  08/17/16 1131     Updated:  08/17/16 1147     POCT - Glucose Whole blood 166 (H) mg/dL     Glucose Whole Blood - POCT [[id number]]  (Abnormal) Collected:  08/17/16 0917     Updated:  08/17/16 0919     POCT - Glucose Whole blood 193 (H) mg/dL           Rads:   Ct Chest Wo Contrast    Result Date: 08/13/2016  1. Irregular opacity at the right lung base medially. Findings may be due to scarring however metastatic disease is not excluded. Follow-up in the prior studies are available then comparison to prior studies is recommended.. 2. Sclerotic lesion left T4 pedicle. This is indeterminate. Metastatic disease is not excluded. Other findings as above. Jasmine December  D'Heureux, MD 08/13/2016 7:10 PM     US Renal Kidney    Result Date: 08/12/2016   Renal ultrasound shows no significant change in the degree of right hydronephrosis despite  adequate positioning of the ureteral stent.         The urinary bladder is decompressed by [name] Foley catheter.                           Miguel Dibble, MD 08/12/2016 7:58 AM     Ct Abdomen Pelvis W Po Contrast Only    Result Date: 08/13/2016  1. No evidence of metastatic disease on this noncontrast study. 2. Bilateral hydronephrosis. Right ureteral stent in place. 3. Fatty lesion in the inferior to the left kidney of uncertain etiology. Findings may  be due to fat necrosis however liposarcoma is not excluded. 4. Other findings as above. Jasmine December  D'Heureux, MD 08/13/2016 7:20 PM         Signed by:  Barnie Mort, PA-C  Physician Assistant  [facility]  Daytime: Spectralink [phone]  Consult requests: Spectralink [phone]  After 5:00 pm: [phone]    Please see attending Neurologist note that accompanies this mid-level encounter note.

## 2016-08-18 NOTE — Progress Notes (Addendum)
[facility] PROGRESS NOTE  [facility]      Date Time: 08/18/16 11:00 AM  Patient Name: Glenda [name], Glenda [name]  Medical Record #:  [id number]  Account#:  [id number]  Admission Date:  08/08/2016         Patient Active Problem List   Diagnosis   . Hydronephrosis of right kidney   . Cerebrovascular accident (CVA), unspecified mechanism   . Atrial myxoma   . Bilateral hydronephrosis   . UTI (urinary tract infection)   . AKI (acute kidney injury)       Subjective:   Denies chest pain, SOB or palpitations.  Anxiously awaiting surgery.   Son at bedside.    Assessment:    Admitted 08/08/16 with aphasia, outside window for lytics, with MRI showing an acute left MCA distribution CVA. Symptoms improving.   Large RA mass occupying much of the RA seen on TEE, likely representing a myxoma. Possible interatrial shunt by TEE. Normal LV function   Moderate AS by TTE 08/08/16   CAD s/p 4V-CABG in 2010 [facility] [place])   [facility] 08/17/16 - 3/4 bypass grafts patent with chronic occlusion of RCA and right coronary bypass graft   HTN    Obstructive uropathy recent ureteral stenting 07/2016, creat falling from 1.3-1.7, 1.6 today   Prior renal artery stenosis w stenting   HLD   DM suboptimally controlled a1c 7.7   OA      Recommendations:    Noted plan for CT surgery after Plavix washout.  Spoke with CT surgery - scheduled for Friday.    Creatinine 1.6 today post cath   Continue amlodipine, metoprolol, and statin   Heparin per neuro     Patient seen and examined, my assessment and plan as above. She has a 1/6 SEM but no tumor plop. Awaiting surgery likely at end of the week, as above. Will see again Thursday.        Medications:      Scheduled Meds:      amLODIPine 10 mg Oral Daily   atorvastatin 40 mg Oral QHS   insulin glargine 8 Units Subcutaneous QHS   metoprolol XL 100 mg Oral Daily       Continuous Infusions:  . sodium chloride 60 mL/hr at 08/17/16 2134   . heparin 25,000 units in dextrose 5% 500 mL 14 Units/kg/hr  (08/18/16 0937)            Physical Exam:       VITAL SIGNS PHYSICAL EXAM   Vitals:    08/18/16 0736   BP: 147/78   Pulse: 71   Resp: 18   Temp: 97 F (36.1 C)   SpO2: 98%     Temp (24hrs), Avg:96.6 F (35.9 C), Min:96.1 F (35.6 C), Max:97 F (36.1 C)      Telemetry: Reviewed SR 70-80s      Intake/Output Summary (Last 24 hours) at 08/18/16 1100  Last data filed at 08/18/16 0537   Gross per 24 hour   Intake                0 ml   Output             1975 ml   Net            -1975 ml    Physical Exam  General: awake, alert, breathing comfortably, no acute distress  Head: normocephalic  Eyes: EOM's intact  Cardiovascular: regular rate and rhythm, normal S1, S2, no S3, no S4, grade 2/6  SEM  Neck: no carotid bruits or JVD  Lungs: clear to auscultation bilateraly, without wheezing, rhonchi, or rales  Abdomen: soft, non-tender, non-distended; no palpable masses,  normoactive bowel sounds  Extremities: no edema. Right fem cath site with no redness, swelling. No hematoma. NV intact.   Pulse: equal pulses, 4/4 symmetric  Neurological: Alert and oriented X3, mood and affect normal  Musculoskeletal: normal strength and tone         Labs:                     Recent Labs  Lab 08/18/16  0309   Magnesium 1.7       Recent Labs  Lab 08/18/16  0856  08/13/16  1724   PT  --   --  11.8*   PT INR  --   --  0.9   PTT 80* More results in Results Review 31   More results in Results Review = values in this interval not displayed.    Recent Labs  Lab 08/18/16  0309 08/17/16  0556 08/16/16  0410   WBC 10.28 8.93 8.72   Hgb 10.0* 10.4* 11.2*   Hematocrit 32.2* 32.6* 35.0*   Platelets 361 361 360       Recent Labs  Lab 08/18/16  0309 08/17/16  0556 08/16/16  0410   Sodium 141 139 138   Potassium 4.4 4.1 4.3   Chloride 109 107 106   CO2 22 26 22    BUN 32.0* 33.0* 28.0*   Creatinine 1.6* 1.8* 1.5*   EGFR 31.2 27.2 33.6   Glucose 104* 186* 153*   Calcium 8.7 8.6 8.8           Invalid input(s): FREET4    .No results found for: BNP     Weight  Monitoring 07/25/2016 08/05/2016 08/08/2016 08/08/2016   Height 162.6 cm 162.6 cm - 165.1 cm   Height Method Stated - - Estimated   Weight 65.772 kg 65.772 kg 65 kg 65.772 kg   Weight Method Stated - - Estimated   BMI (calculated) 24.9 kg/m2 24.9 kg/m2 - 24.2 kg/m2       Imaging:       ____________________________________________    Signed by: Queen Slough, NP      [facility]  NP Spectralink [phone] (8am-5pm)  MD Spectralink [phone] or [phone] (8am-5pm)  Arrhythmia Spectralink [phone] (8am-4:30pm)  After hours, non urgent consult line [phone]  After Hours, urgent consults [phone]

## 2016-08-18 NOTE — Progress Notes (Signed)
[facility] [employer].  Progress Note    Assessment:   AKI - Due to hemodynamics    CKD Stage 3 -  Probable Diabetic Nephropathy    History of bilateral hydronephrosis - right ureteral stent, per urology nointervention at this time    DM 2 with CKD   Nephrotic range proteinuria   Atrial myxoma   CVA    Plan:   Continue IVFs as she is doing well renal wise post cath   Possible OR on Friday   Follow up MAG3 scan results    Troy Sine, MD  Office - [phone]  Spectra Link - [phone] or TigerText  ++++++++++++++++++++++++++++++++++++++++++++++++++++++++++++++  Subjective:  No new complaints    Medications:  Scheduled Meds:  Current Facility-Administered Medications   Medication Dose Route Frequency   . amLODIPine  10 mg Oral Daily   . atorvastatin  40 mg Oral QHS   . insulin glargine  8 Units Subcutaneous QHS   . metoprolol XL  100 mg Oral Daily     Continuous Infusions:  . sodium chloride 60 mL/hr at 08/17/16 2134   . heparin 25,000 units in dextrose 5% 500 mL 14 Units/kg/hr (08/18/16 0937)     PRN Meds:acetaminophen **OR** acetaminophen, acetaminophen, Nursing communication: Adult Hypoglycemia Treatment Algorithm **AND** dextrose **AND** dextrose **AND** glucagon (rDNA) **AND** Nursing communication: Document Significant Event Note, furosemide, hydrALAZINE, insulin lispro, insulin lispro, LORazepam, ondansetron **OR** ondansetron, traMADol    Objective:  Vital signs in last 24 hours:  Temp:  [96.1 F (35.6 C)-97 F (36.1 C)] 96.3 F (35.7 C)  Heart Rate:  [64-79] 68  Resp Rate:  [16-18] 18  BP: (115-147)/(64-78) 115/73  Intake/Output last 24 hours:    Intake/Output Summary (Last 24 hours) at 08/18/16 1726  Last data filed at 08/18/16 1500   Gross per 24 hour   Intake                0 ml   Output             3625 ml   Net            -3625 ml     Intake/Output this shift:  I/O this shift:  In: -   Out: 1650 [Urine:1650]    Physical Exam:   Gen: WD WN NAD   CV: S1 S2 N  RRR   Chest: CTAB   Ab: ND NT soft no HSM +BS   Ext: No C/E    Labs:    Recent Labs  Lab 08/18/16  0309 08/17/16  0556 08/16/16  0410   Glucose 104* 186* 153*   BUN 32.0* 33.0* 28.0*   Creatinine 1.6* 1.8* 1.5*   Calcium 8.7 8.6 8.8   Sodium 141 139 138   Potassium 4.4 4.1 4.3   Chloride 109 107 106   CO2 22 26 22    Phosphorus 4.8* 4.6 4.5   Magnesium 1.7 1.9 1.7       Recent Labs  Lab 08/18/16  0309 08/17/16  0556 08/16/16  0410   WBC 10.28 8.93 8.72   Hgb 10.0* 10.4* 11.2*   Hematocrit 32.2* 32.6* 35.0*   MCV 88.0 87.9 86.2   MCH 27.3* 28.0 27.6*   MCHC 31.1* 31.9* 32.0   RDW 15 15 15    MPV 10.3 10.1 10.1   Platelets 361 361 360

## 2016-08-18 NOTE — Plan of Care (Signed)
Pt A/Ox4, MAE, FC, speech clear. Pt denies pain on assessment and reassessment. Encouraged to self monitor. On room air, no signs of respiratory distress. Continuous heparin infusing. APTT @0900  was 80, per protocol dose decreased by 1 unit to 14 units/kg/hr. Next APTT @1500 . NS infusing @60cc /hr as well. Pt sent to NM for renal test, escorted by Joyce Gross, RN due to heparin infusing. Neurology and cardiology rounds completed. Foley care done and documented.  Lunch time BS was 356, 10 units insulin coverage provided, MD notified per protocol. Per cardiology note, surgery scheduled for Friday. Family present at bedside all day, updated during MDR.  1545 APTT was 68, per protocol no change in dose/rate, next APTT to be drawn at 0345. Safety measures in place, will continue to monitor.

## 2016-08-18 NOTE — Progress Notes (Signed)
08/14/16 1034   Healthcare Decisions   Interviewed: Family   Orientation/Decision Making Abilities of Patient Alert and Oriented x3, able to make decisions  (patient off unit at time of assessment)   Advance Directive Patient does not have advance directive   Healthcare Agent Appointed No   Healthcare Agent's Name (son and daighter in law active with her care)   Prior to admission   Prior level of function Independent with ADLs;Ambulates independently   Type of Residence Private residence   Home Layout One level;Stairs to enter with rails (add number in comment)   Have running water, electricity, heat, etc? Yes   Living Arrangements Children  (Lives with other son and daughter in law  in [place] however; will [place] to SNF; to local son's home, and they will get her back to [facility] when she's able to travel.)   How do you get to your MD appointments? (family)   How do you get your groceries? (family)   Who fixes your meals? (family)   Who does your laundry? (family)   Who picks up your prescriptions? (family)   Dressing Independent   Grooming Independent   Feeding Independent   Bathing Independent   Toileting Independent   DME Currently at Home Single point cane;Front wheel walker;3-in-1 [facility]   Name of Prior Assisted Living Facility (none)   Home Care/Community Services (none)   Prior SNF admission? (Detail) (none)   Prior Rehab admission? (Detail) (none)   Adult Protective Services (APS) involved? No  (none)   Discharge Planning   Support Systems Children;Family members;Friends/neighbors   Patient expects to be discharged to: (SNF)   Anticipated [place] plan discussed with: Same as interviewed   Follow up appointment scheduled? No   Potential barriers to discharge: Decreased mobility   Mode of transportation: (TBD)   Important Message from Medicare Notice   Patient received 1st IMM Letter? Yes   Date of most recent IMM given: 08/08/16   Eligha Bridegroom, RN CCM  Clinical Case Manager  [facility]  [phone]

## 2016-08-19 LAB — BASIC METABOLIC PANEL
BUN: 31 mg/dL — ABNORMAL HIGH (ref 7.0–19.0)
CO2: 22 mEq/L (ref 22–29)
Calcium: 8.5 mg/dL (ref 7.9–10.2)
Chloride: 109 mEq/L (ref 100–111)
Creatinine: 1.7 mg/dL — ABNORMAL HIGH (ref 0.6–1.0)
Glucose: 122 mg/dL — ABNORMAL HIGH (ref 70–100)
Potassium: 4.1 mEq/L (ref 3.5–5.1)
Sodium: 139 mEq/L (ref 136–145)

## 2016-08-19 LAB — CBC
Absolute NRBC: 0 10*3/uL
Hematocrit: 31.1 % — ABNORMAL LOW (ref 37.0–47.0)
Hgb: 9.8 g/dL — ABNORMAL LOW (ref 12.0–16.0)
MCH: 27.7 pg — ABNORMAL LOW (ref 28.0–32.0)
MCHC: 31.5 g/dL — ABNORMAL LOW (ref 32.0–36.0)
MCV: 87.9 fL (ref 80.0–100.0)
MPV: 10.1 fL (ref 9.4–12.3)
Nucleated RBC: 0 /100 WBC (ref 0.0–1.0)
Platelets: 348 10*3/uL (ref 140–400)
RBC: 3.54 10*6/uL — ABNORMAL LOW (ref 4.20–5.40)
RDW: 15 % (ref 12–15)
WBC: 8.58 10*3/uL (ref 3.50–10.80)

## 2016-08-19 LAB — GLUCOSE WHOLE BLOOD - POCT
Whole Blood Glucose POCT: 133 mg/dL — ABNORMAL HIGH (ref 70–100)
Whole Blood Glucose POCT: 206 mg/dL — ABNORMAL HIGH (ref 70–100)
Whole Blood Glucose POCT: 206 mg/dL — ABNORMAL HIGH (ref 70–100)
Whole Blood Glucose POCT: 213 mg/dL — ABNORMAL HIGH (ref 70–100)

## 2016-08-19 LAB — APTT
PTT: 73 s — ABNORMAL HIGH (ref 23–37)
PTT: 77 s — ABNORMAL HIGH (ref 23–37)
PTT: 54 s — ABNORMAL HIGH (ref 23–37)

## 2016-08-19 LAB — PHOSPHORUS: Phosphorus: 4.9 mg/dL — ABNORMAL HIGH (ref 2.3–4.7)

## 2016-08-19 LAB — MAGNESIUM: Magnesium: 1.6 mg/dL (ref 1.6–2.6)

## 2016-08-19 LAB — GFR: EGFR: 29

## 2016-08-19 MED ORDER — MAGNESIUM SULFATE IN D5W 1-5 GM/100ML-% IV SOLN
1.0000 g | INTRAVENOUS | Status: AC
Start: 2016-08-19 — End: 2016-08-19
  Administered 2016-08-19 (×2): 1 g via INTRAVENOUS
  Filled 2016-08-19 (×2): qty 100

## 2016-08-19 NOTE — Progress Notes (Signed)
HOSPITALIST PROGRESS NOTE    Date Time: 08/19/16 12:51 PM  Patient Name: Glenda Green  Attending Physician: Gildardo Griffes, DO        Subjective:   Patient seen and examined. She is very alert and interactive this morning. AAO x 3. No chest pain, SOB, abdominal pain, N/V/D. No fevers or chills. No headache.    RN at bedside - as part of [facility] rounding.    Family updated by me at bedside.     TEE done already - confirms myxoma.     Cath done - cleared for myxoma resection.    Had Renal Lasix done - results reviewed     For CT surgery this Friday (7am is scheduled time).      Physical Exam:     Vitals:    08/19/16 1106   BP: 139/73   Pulse: 64   Resp: 17   Temp: 97.7 F (36.5 C)   SpO2: 99%       Intake and Output Summary (Last 24 hours) at Date Time    Intake/Output Summary (Last 24 hours) at 08/19/16 1251  Last data filed at 08/19/16 0502   Gross per 24 hour   Intake                0 ml   Output             4125 ml   Net            -4125 ml       General: awake, alert, oriented x 3; no acute distress, mental status is much improved today  HEENT: perrla, eomi, sclera anicteric  oropharynx clear without lesions, mucous membranes moist  Neck: supple, no lymphadenopathy, no thyromegaly, no JVD, no carotid bruits  Cardiovascular: regular rate and rhythm, ++ systolic murmur, rubs or gallops  Lungs: clear to auscultation bilaterally, without wheezing, rhonchi, or rales  Abdomen: soft, non-tender, non-distended; no palpable masses, no hepatosplenomegaly, normoactive bowel sounds, no rebound or guarding  Extremities: no clubbing, cyanosis, or edema, + R groin vascular access incision (C/D/I)  Neuro: cranial nerves grossly intact, moves all extremities (5/5 all extremities), RUE pronator drift, R Hemineglect -- neuro exam improving  Skin: no rashes or lesions noted      Medications:     Current Facility-Administered Medications   Medication Dose Route Frequency   . amLODIPine  10 mg Oral Daily   . atorvastatin  40 mg  Oral QHS   . insulin glargine  8 Units Subcutaneous QHS   . magnesium sulfate  1 g Intravenous Q1H   . metoprolol XL  100 mg Oral Daily         Labs:     Results     Procedure Component Value Units Date/Time    Glucose Whole Blood - POCT [[id number]]  (Abnormal) Collected:  08/19/16 1110     Updated:  08/19/16 1157     POCT - Glucose Whole blood 206 (H) mg/dL     Glucose Whole Blood - POCT [[id number]]  (Abnormal) Collected:  08/19/16 0809     Updated:  08/19/16 0831     POCT - Glucose Whole blood 133 (H) mg/dL     APTT [[id number]]  (Abnormal) Collected:  08/19/16 0405     Updated:  08/19/16 0451     PTT 73 (H) sec      APTT Anticoag. Given w/i 48 hrs. heparin    Magnesium [[id number]] Collected:  08/19/16 0405  Specimen:  Blood Updated:  08/19/16 0442     Magnesium 1.6 mg/dL     Narrative:       Per low intensity heparin protocol.    Phosphorus [[id number]]  (Abnormal) Collected:  08/19/16 0405    Specimen:  Blood Updated:  08/19/16 0442     Phosphorus 4.9 (H) mg/dL     Narrative:       Per low intensity heparin protocol.    GFR [[id number]] Collected:  08/19/16 0405     Updated:  08/19/16 0442     EGFR 29.0    Narrative:       Per low intensity heparin protocol.    Basic Metabolic Panel [[id number]]  (Abnormal) Collected:  08/19/16 0405    Specimen:  Blood Updated:  08/19/16 0442     Glucose 122 (H) mg/dL      BUN 08.6 (H) mg/dL      Creatinine 1.7 (H) mg/dL      Calcium 8.5 mg/dL      Sodium 578 mEq/L      Potassium 4.1 mEq/L      Chloride 109 mEq/L      CO2 22 mEq/L     Narrative:       Per low intensity heparin protocol.    CBC [[id number]]  (Abnormal) Collected:  08/19/16 0405    Specimen:  Blood from Blood Updated:  08/19/16 0432     WBC 8.58 x10 3/uL      Hgb 9.8 (L) g/dL      Hematocrit 41.3 (L) %      Platelets 348 x10 3/uL      RBC 3.54 (L) x10 6/uL      MCV 87.9 fL      MCH 27.7 (L) pg      MCHC 31.5 (L) g/dL      RDW 15 %      MPV 10.1 fL      Nucleated RBC 0.0 /100 WBC      Absolute NRBC 0.00 x10 3/uL      Narrative:       Per low intensity heparin protocol.    Glucose Whole Blood - POCT [[id number]]  (Abnormal) Collected:  08/18/16 2102     Updated:  08/18/16 2109     POCT - Glucose Whole blood 285 (H) mg/dL     Glucose Whole Blood - POCT [[id number]]  (Abnormal) Collected:  08/18/16 1642     Updated:  08/18/16 1703     POCT - Glucose Whole blood 287 (H) mg/dL     Glucose Whole Blood - POCT [[id number]]  (Abnormal) Collected:  08/18/16 1643     Updated:  08/18/16 1703     POCT - Glucose Whole blood 201 (H) mg/dL     IFE Review, Urine [[id number]] Collected:  08/15/16 1219     Updated:  08/18/16 1647     IFE Urine Review See Note    URINE IMMUNOFIXATION [[id number]] Collected:  08/15/16 1453     Updated:  08/18/16 1645     IFE Urine Interp See IFE Review    APTT [[id number]]  (Abnormal) Collected:  08/18/16 1545     Updated:  08/18/16 1638     PTT 68 (H) sec      APTT Anticoag. Given w/i 48 hrs. heparin    IFE Review, Serum [[id number]] Collected:  08/15/16 1219     Updated:  08/18/16 1634     IFE Serum Review See Note    Immunofixation  electrophoresis, Serum [[id number]] Collected:  08/15/16 1219    Specimen:  Blood Updated:  08/18/16 1634     IFE Serum Interp See IFE Review    Glucose Whole Blood - POCT [[id number]]  (Abnormal) Collected:  08/18/16 1135     Updated:  08/18/16 1421     POCT - Glucose Whole blood 356 (H) mg/dL             Radiology:     Radiology Results (24 Hour)     Procedure Component Value Units Date/Time    NM Kidney W Flow And Function Doylene Canard [[id number]] Collected:  08/18/16 1358    Order Status:  Completed Updated:  08/18/16 1714    Narrative:       History: Status post right ureteral stent exchange with continuing  right-sided hydronephrosis.    Comment:    The patient was injected with 13 mCi Tc-24m MAG3. Dynamic images over  the abdomen were obtained in posterior view. At 10 minutes, the patient  was injected with 40 mg lasix intravenously and images were acquired for  Green total of44  minutes.    There is prompt symmetric blood flow to both kidneys. Central photopenia  right kidney corresponding to hydronephrosis.  Evaluation of the split  renal function reveals 54 % to the left and 46 % to the right.    There  is progressive clearance by the right kidney with persistent trace  accumulation in the renal pelvis. There is rapid clearance by the left  kidney. Both ureters visualized.    Whole kidney activity curve: Peak activity left 9 minutes and the right  13 minutes, diuretic half clearance time left 7 minutes and right  greater than 20 minutes.      Impression:         1. Progressive clearance by right kidney with persistent tracer  accumulation in renal pelvis with prolonged Lasix half clearance time in  the obstructive range. No complete obstruction. Patulous collecting  system can blunt Lasix response.  2. No scintigraphic evidence of urinary obstruction on the left.  3. Split renal function left 54% and right 46%.          Clide Cliff, MD   08/18/2016 5:10 PM              Assessment:     Patient Active Problem List   Diagnosis   . Hydronephrosis of right kidney   . Cerebrovascular accident (CVA), unspecified mechanism   . Atrial myxoma   . Bilateral hydronephrosis   . UTI (urinary tract infection)   . AKI (acute kidney injury)         Plan:   # Left MCA Syndrome with ACUTE CVA  - Continue IV Heparin gtt (without Bolus) - as instructed by Neurology - until CT surgery this Fri at 7am  - Cath done - cleared for surgery  - Continue Lipitor 40mg   - TEE confirms: Large RA mass taking up most or FRA cavity but not obstructing or prolapsing across the TV orifice. Size: at least 5.5 cm in height and 3.5 cm in width.  - Continue on cardiac monitor  - Noted MRI/MRA results    # R Atrial Myxoma  - Noted on Echo and confirmed on TEE  - TEE confirms: Large RA mass taking up most or FRA cavity but not obstructing or prolapsing across the TV orifice. Size: at least 5.5 cm in height and 3.5 cm in  width.  -  Appreciate Cardiology recommendations  - CT Chest/Abdomen/Pelvis noted  - CT Surgery - for OR later this Fri    # AKI  - Renal function fluctuating   - Keep Foley in for now  - Discussed with urology - no plans for any acute intervention  - Appreciate Nephrology input   - Continue IVFs for now (managed by Nephrology)   - Lasix renal scan reviewed     # Bilateral Hydronephrosis   - Continue Foley as per discussion with urology    # UTI  - Completed course of IV Ceftriaxone     # HTN  - Continue Metoprolol 100mg    - Continue Amlodipine 10mg   - Needs good and aggressive BP control -- well controlled on the new regimen  - Continue to monitor BP    # DM2  - Continue SSI      DVT Prophylaxis: IV Heparin drip  Diet: Cardiac Diet  Anticipated discharge disposition and date:  Later this week (end of week)    Signed by: Gildardo Griffes, D.O.   cc:Pcp, Noneorunknown, MD

## 2016-08-19 NOTE — Plan of Care (Signed)
Neuro status stable. Pt A/Ox4, very pleasant, clear speech, MAE, FC. Pt denies pain on assessment and reassessment. Tolerating PO without distress. All due medications given as ordered. 2 doses of IV magnesium given per order. Continuous heparin infusing. APTT  at 1600 was 77, per protocol dose decreased by unit to 13 units/kg/hr, recheck aPTT at 2300. Cardiac surgery consult completed today. Surgery scheduled for 7am on Friday. Family present and updated. Foley in place, foley care performed and documented. Safety measures in place, will continue to monitor.

## 2016-08-19 NOTE — Plan of Care (Signed)
Problem: Safety  Goal: Patient will be free from injury during hospitalization  Outcome: Progressing   08/19/16 0447   Goal/Interventions addressed this shift   Patient will be free from injury during hospitalization  Assess patient's risk for falls and implement fall prevention plan of care per policy;Provide and maintain safe environment       Problem: Every Day - Stroke  Goal: Neurological status is stable or improving  Outcome: Progressing   08/19/16 0447   Goal/Interventions addressed this shift   Neurological status is stable or improving Monitor/assess/document neurological assessment (Stroke: every 4 hours);Perform CAM Assessment     Problem: Compromised Tissue integrity  Goal: Damaged tissue is healing and protected  Outcome: Progressing   08/19/16 0447   Goal/Interventions addressed this shift   Damaged tissue is healing and protected  Keep intact skin clean and dry;Reposition patient every 2 hours and as needed unless able to reposition self;Monitor/assess Braden scale every shift       Comments: Pt A/Ox4, MAE: moderate strength all around. follows commands. Denies pain. NSR on tele HR=80s. On low intensity heparin drip running at 14 units/kg/hr, next draw at 0345 APTT = 73, per protocol no change. Next APTT due 1605. IVF NaCl running @ 60 ml/hr through midline. Accucheck AC/HS, BS=285, given 4 units and scheduled lantus. Foley care completed. Fall precautions in place: call bell within reach, fall mat on floor, bed alarm on. Will continue to monitor.

## 2016-08-19 NOTE — Progress Notes (Signed)
[facility], [employer].  Progress Note    Assessment:  1.  Acute on CRF; multifactorial etiology; fluctuating serum creatinine  2.  R/o right atrial myxoma/PFO with ? Paradoxical embolus -> CVA  3.  MMPs    Plan:  1. Continue  NS @ 60cc/hr  2.  Lasix renal scan reviewed - right partial obstruction; would not pursue PCN at this time  3.  Continue current medication regimen  4.  qam labs to include Mg/PO4  5.  Cardiac surgery 9/29    Further recommendations to follow    Thank you    Louellen Molder, MD  Office - [phone]  Spectra Link - [phone]  ++++++++++++++++++++++++++++++++++++++++++++++++++++++++++++++  Subjective:  No new complaints    Medications:  Scheduled Meds:  Current Facility-Administered Medications   Medication Dose Route Frequency   . amLODIPine  10 mg Oral Daily   . atorvastatin  40 mg Oral QHS   . insulin glargine  8 Units Subcutaneous QHS   . magnesium sulfate  1 g Intravenous Q1H   . metoprolol XL  100 mg Oral Daily     Continuous Infusions:  . sodium chloride 60 mL/hr at 08/19/16 0102   . heparin 25,000 units in dextrose 5% 500 mL 14 Units/kg/hr (08/19/16 0512)     PRN Meds:acetaminophen **OR** acetaminophen, acetaminophen, Nursing communication: Adult Hypoglycemia Treatment Algorithm **AND** dextrose **AND** dextrose **AND** glucagon (rDNA) **AND** Nursing communication: Document Significant Event Note, furosemide, hydrALAZINE, insulin lispro, insulin lispro, LORazepam, ondansetron **OR** ondansetron, traMADol    Objective:  Vital signs in last 24 hours:  Temp:  [96.2 F (35.7 C)-98 F (36.7 C)] 97.7 F (36.5 C)  Heart Rate:  [64-76] 64  Resp Rate:  [16-18] 17  BP: (119-139)/(63-84) 139/73  Intake/Output last 24 hours:    Intake/Output Summary (Last 24 hours) at 08/19/16 1210  Last data filed at 08/19/16 0502   Gross per 24 hour   Intake                0 ml   Output             4125 ml   Net            -4125 ml     Intake/Output this shift:  No intake/output data  recorded.    Physical Exam:   Gen: Alert/pleasant   CV: S1 S2 N RRR   Chest: No rales/no rhonchi   Ab: ND NT soft no HSM +BS   Ext: No edema    Labs:    Recent Labs  Lab 08/19/16  0405 08/18/16  0309 08/17/16  0556   Glucose 122* 104* 186*   BUN 31.0* 32.0* 33.0*   Creatinine 1.7* 1.6* 1.8*   Calcium 8.5 8.7 8.6   Sodium 139 141 139   Potassium 4.1 4.4 4.1   Chloride 109 109 107   CO2 22 22 26    Phosphorus 4.9* 4.8* 4.6   Magnesium 1.6 1.7 1.9       Recent Labs  Lab 08/19/16  0405 08/18/16  0309 08/17/16  0556   WBC 8.58 10.28 8.93   Hgb 9.8* 10.0* 10.4*   Hematocrit 31.1* 32.2* 32.6*   MCV 87.9 88.0 87.9   MCH 27.7* 27.3* 28.0   MCHC 31.5* 31.1* 31.9*   RDW 15 15 15    MPV 10.1 10.3 10.1   Platelets 348 361 361

## 2016-08-19 NOTE — OT Progress Note (Signed)
Occupational Therapy Cancellation Note    Patient: Glenda Green  ZOX:[id number]    Unit: J811/B147.82    Patient not seen for occupational therapy secondary to pt declined status she does not feel up to participating in tx appears concerned about her cardiac surgery. Son at bed side. Encouraged pt to perform ROm EXS and get OOB for meals .     Ander Purpura, MA/OTR, CSRS, LSVT-BIG  [phone]

## 2016-08-19 NOTE — Progress Notes (Addendum)
I met with patient and son, discussed upcoming surgery tenatively Friday September 29 @0700  with Dr Garen Grams.    Preop orders will be placed.  RN present at bedside    Silvano Rusk, ACNP  CV Surgery Consult   [phone]

## 2016-08-20 ENCOUNTER — Inpatient Hospital Stay: Payer: Medicare Other

## 2016-08-20 LAB — MAGNESIUM: Magnesium: 2.1 mg/dL (ref 1.6–2.6)

## 2016-08-20 LAB — URINALYSIS WITH MICROSCOPIC
Bilirubin, UA: NEGATIVE
Glucose, UA: >=500 — AB
Ketones UA: NEGATIVE
Nitrite, UA: NEGATIVE
Protein, UR: 100 — AB
Specific Gravity UA: 1.01 (ref 1.001–1.035)
Urine pH: 6 (ref 5.0–8.0)
Urobilinogen, UA: NORMAL mg/dL

## 2016-08-20 LAB — GLUCOSE WHOLE BLOOD - POCT
Whole Blood Glucose POCT: 151 mg/dL — ABNORMAL HIGH (ref 70–100)
Whole Blood Glucose POCT: 355 mg/dL — ABNORMAL HIGH (ref 70–100)
Whole Blood Glucose POCT: 188 mg/dL — ABNORMAL HIGH (ref 70–100)
Whole Blood Glucose POCT: 328 mg/dL — ABNORMAL HIGH (ref 70–100)

## 2016-08-20 LAB — CBC
Absolute NRBC: 0 10*3/uL
Hematocrit: 31.9 % — ABNORMAL LOW (ref 37.0–47.0)
Hgb: 10.1 g/dL — ABNORMAL LOW (ref 12.0–16.0)
MCH: 27.9 pg — ABNORMAL LOW (ref 28.0–32.0)
MCHC: 31.7 g/dL — ABNORMAL LOW (ref 32.0–36.0)
MCV: 88.1 fL (ref 80.0–100.0)
MPV: 10.3 fL (ref 9.4–12.3)
Nucleated RBC: 0 /100 WBC (ref 0.0–1.0)
Platelets: 360 10*3/uL (ref 140–400)
RBC: 3.62 10*6/uL — ABNORMAL LOW (ref 4.20–5.40)
RDW: 15 % (ref 12–15)
WBC: 8.09 10*3/uL (ref 3.50–10.80)

## 2016-08-20 LAB — APTT
PTT: 55 s — ABNORMAL HIGH (ref 23–37)
PTT: 57 s — ABNORMAL HIGH (ref 23–37)

## 2016-08-20 LAB — BASIC METABOLIC PANEL
BUN: 31 mg/dL — ABNORMAL HIGH (ref 7.0–19.0)
CO2: 23 mEq/L (ref 22–29)
Calcium: 8.9 mg/dL (ref 7.9–10.2)
Chloride: 110 mEq/L (ref 100–111)
Creatinine: 1.8 mg/dL — ABNORMAL HIGH (ref 0.6–1.0)
Glucose: 168 mg/dL — ABNORMAL HIGH (ref 70–100)
Potassium: 5 mEq/L (ref 3.5–5.1)
Sodium: 139 mEq/L (ref 136–145)

## 2016-08-20 LAB — PT/INR
PT INR: 0.9 (ref 0.9–1.1)
PT: 12.3 s — ABNORMAL LOW (ref 12.6–15.0)

## 2016-08-20 LAB — TYPE AND SCREEN
AB Screen Gel: NEGATIVE
ABO Rh: B POS

## 2016-08-20 LAB — PHOSPHORUS: Phosphorus: 4.8 mg/dL — ABNORMAL HIGH (ref 2.3–4.7)

## 2016-08-20 LAB — COLD AGGLUTININ SCREEN: Cold Screen: NEGATIVE

## 2016-08-20 LAB — GFR: EGFR: 27.2

## 2016-08-20 MED ORDER — MUPIROCIN CALCIUM 2 % NA OINT
TOPICAL_OINTMENT | NASAL | Status: DC
Start: 2016-08-20 — End: 2016-08-20

## 2016-08-20 MED ORDER — CHLORHEXIDINE GLUCONATE 0.12 % MT SOLN
15.0000 mL | OROMUCOSAL | Status: DC
Start: 2016-08-21 — End: 2016-08-21
  Filled 2016-08-20: qty 15

## 2016-08-20 MED ORDER — MUPIROCIN CALCIUM 2 % NA OINT
TOPICAL_OINTMENT | Freq: Two times a day (BID) | NASAL | Status: AC
Start: 2016-08-20 — End: 2016-08-21
  Administered 2016-08-20: 0.9 via NASAL
  Filled 2016-08-20 (×2): qty 0.9

## 2016-08-20 NOTE — Plan of Care (Signed)
Problem: Pain  Goal: Pain at adequate level as identified by patient  Outcome: Progressing  Pt A&OX3. Speech clear. Follows commands. Able to move all extremities. VSS. Headache earlier resolved with prn tylenol. . Continues on heparin drip. Next at 1000. Foley intact. Call bell within reach. Will continue to monitor

## 2016-08-20 NOTE — Progress Notes (Signed)
[place] HEART PROGRESS NOTE  [facility]      Date Time: 08/20/16 8:05 AM  Patient Name: Glenda Green, Glenda Green  Medical Record #:  [id number]  Account#:  [id number]  Admission Date:  08/08/2016         Patient Active Problem List   Diagnosis   . Hydronephrosis of right kidney   . Cerebrovascular accident (CVA), unspecified mechanism   . Atrial myxoma   . Bilateral hydronephrosis   . UTI (urinary tract infection)   . AKI (acute kidney injury)       Subjective:   Denies chest pain, SOB or palpitations. Awake, alert - in good spirits    Assessment:    Admitted 08/08/16 with aphasia, outside window for lytics, with MRI showing an acute left MCA distribution CVA. Symptoms improving.   Large RA mass occupying much of the RA seen on TEE, likely representing a myxoma. Possible interatrial shunt by TEE. Normal LV function   Moderate AS by TTE 08/08/16   CAD s/p 4V-CABG in 2010 [facility] [place])   [facility] 08/17/16 - 3/4 bypass grafts patent with chronic occlusion of RCA and right coronary bypass graft - stable anatomy.   HTN    Obstructive uropathy recent ureteral stenting 07/2016, creat falling from 1.3-1.7, 1.8 today   Prior renal artery stenosis w stenting   HLD   DM suboptimally controlled a1c 7.7   OA      Recommendations:    For CT surgery tomorrow - plavix is washed out.   Continue other CV meds peri-operatively   We will follow along        Medications:      Scheduled Meds:      amLODIPine 10 mg Oral Daily   atorvastatin 40 mg Oral QHS   insulin glargine 8 Units Subcutaneous QHS   metoprolol XL 100 mg Oral Daily       Continuous Infusions:  . sodium chloride 60 mL/hr at 08/19/16 0102   . heparin 25,000 units in dextrose 5% 500 mL 13 Units/kg/hr (08/19/16 1700)            Physical Exam:       VITAL SIGNS PHYSICAL EXAM   Vitals:    08/20/16 0758   BP: 133/70   Pulse: 68   Resp: 16   Temp: 97 F (36.1 C)   SpO2: 93%     Temp (24hrs), Avg:97.2 F (36.2 C), Min:96.6 F (35.9 C), Max:97.7 F (36.5  C)      Telemetry: Reviewed no changes      Intake/Output Summary (Last 24 hours) at 08/20/16 0805  Last data filed at 08/20/16 0516   Gross per 24 hour   Intake                0 ml   Output             4050 ml   Net            -4050 ml    Physical Exam  General: awake, alert, breathing comfortably, no acute distress  Head: normocephalic  Eyes: EOM's intact  Cardiovascular: regular rate and rhythm, normal S1, S2, no S3, no S4, no murmurs, rubs or gallops  Neck: no carotid bruits or JVD  Lungs: clear to auscultation bilateraly, without wheezing, rhonchi, or rales  Abdomen: soft, non-tender, non-distended; no palpable masses,  normoactive bowel sounds  Extremities: no edema  Pulse: equal pulses, 4/4 symmetric  Neurological: Alert and oriented X3, mood and affect  normal  Musculoskeletal: normal strength and tone         Labs:                     Recent Labs  Lab 08/20/16  0604   Magnesium 2.1       Recent Labs  Lab 08/19/16  2205  08/13/16  1724   PT  --   --  11.8*   PT INR  --   --  0.9   PTT 54* More results in Results Review 31   More results in Results Review = values in this interval not displayed.    Recent Labs  Lab 08/20/16  0604 08/19/16  0405 08/18/16  0309   WBC 8.09 8.58 10.28   Hgb 10.1* 9.8* 10.0*   Hematocrit 31.9* 31.1* 32.2*   Platelets 360 348 361       Recent Labs  Lab 08/20/16  0604 08/19/16  0405 08/18/16  0309   Sodium 139 139 141   Potassium 5.0 4.1 4.4   Chloride 110 109 109   CO2 23 22 22    BUN 31.0* 31.0* 32.0*   Creatinine 1.8* 1.7* 1.6*   EGFR 27.2 29.0 31.2   Glucose 168* 122* 104*   Calcium 8.9 8.5 8.7           Invalid input(s): FREET4    .No results found for: BNP     Weight Monitoring 07/25/2016 08/05/2016 08/08/2016 08/08/2016   Height 162.6 cm 162.6 cm - 165.1 cm   Height Method Stated - - Estimated   Weight 65.772 kg 65.772 kg 65 kg 65.772 kg   Weight Method Stated - - Estimated   BMI (calculated) 24.9 kg/m2 24.9 kg/m2 - 24.2 kg/m2       Imaging:        ____________________________________________    Signed by: Jonetta Speak, MD      [facility]  NP Spectralink [phone] (8am-5pm)  MD Spectralink [phone] or [phone] (8am-5pm)  Arrhythmia Spectralink [phone] (8am-4:30pm)  After hours, non urgent consult line [phone]  After Hours, urgent consults [phone]

## 2016-08-20 NOTE — PT Progress Note (Signed)
Physical Therapy Cancellation Note    Patient: Glenda Green  ZOX:[id number]    Unit: J811/B147.82   08/20/2016  Time:  1344      Patient not seen for physical therapy secondary to pt unavailable 2/2 nursing care needs. Pt scheduled for CT surgery tomorrow. Encouraged OOB as tolerated with nurses. Pt acknowledges understanding.   Anticipate new PT orders when medically cleared following cardiac surgery.   Will cont to follow.     Alfonso Ellis, MSPT  Pager [phone]

## 2016-08-20 NOTE — Progress Notes (Signed)
HOSPITALIST PROGRESS NOTE    Date Time: 08/20/16 2:43 PM  Patient Name: Glenda Green  Attending Physician: Gildardo Griffes, DO        Subjective:   Patient seen and examined. She is alert and interactive this morning. AAO x 3. No chest pain, SOB, abdominal pain, N/V/D. No fevers or chills. No headache.    RN at bedside - as part of [facility] rounding.    Family updated by me at bedside.     TEE done - confirms myxoma.     Cath done - cleared for myxoma resection.    For CT surgery tomorrow (7am is scheduled time).      Physical Exam:     Vitals:    08/20/16 1115   BP: 123/68   Pulse: 85   Resp: 18   Temp: 97.5 F (36.4 C)   SpO2: 97%       Intake and Output Summary (Last 24 hours) at Date Time    Intake/Output Summary (Last 24 hours) at 08/20/16 1443  Last data filed at 08/20/16 1003   Gross per 24 hour   Intake                0 ml   Output             4150 ml   Net            -4150 ml       General: awake, alert, oriented x 3; no acute distress, mental status is much improved today  HEENT: perrla, eomi, sclera anicteric  oropharynx clear without lesions, mucous membranes moist  Neck: supple, no lymphadenopathy, no thyromegaly, no JVD, no carotid bruits  Cardiovascular: regular rate and rhythm, ++ systolic murmur, rubs or gallops  Lungs: clear to auscultation bilaterally, without wheezing, rhonchi, or rales  Abdomen: soft, non-tender, non-distended; no palpable masses, no hepatosplenomegaly, normoactive bowel sounds, no rebound or guarding  Extremities: no clubbing, cyanosis, or edema, + R groin vascular access incision (C/D/I)  Neuro: cranial nerves grossly intact, moves all extremities (5/5 all extremities), RUE pronator drift, R Hemineglect -- neuro exam improving  Skin: no rashes or lesions noted      Medications:     Current Facility-Administered Medications   Medication Dose Route Frequency   . amLODIPine  10 mg Oral Daily   . atorvastatin  40 mg Oral QHS   . insulin glargine  8 Units Subcutaneous QHS   .  metoprolol XL  100 mg Oral Daily         Labs:     Results     Procedure Component Value Units Date/Time    Glucose Whole Blood - POCT [[id number]]  (Abnormal) Collected:  08/20/16 1200     Updated:  08/20/16 1231     POCT - Glucose Whole blood 355 (H) mg/dL     Glucose Whole Blood - POCT [[id number]]  (Abnormal) Collected:  08/20/16 0754     Updated:  08/20/16 1231     POCT - Glucose Whole blood 151 (H) mg/dL     APTT [[id number]]  (Abnormal) Collected:  08/20/16 1155     Updated:  08/20/16 1229     PTT 55 (H) sec      APTT Anticoag. Given w/i 48 hrs. heparin    CBC [[id number]]  (Abnormal) Collected:  08/20/16 0604    Specimen:  Blood from Blood Updated:  08/20/16 0643     WBC 8.09 x10 3/uL  Hgb 10.1 (L) g/dL      Hematocrit 16.1 (L) %      Platelets 360 x10 3/uL      RBC 3.62 (L) x10 6/uL      MCV 88.1 fL      MCH 27.9 (L) pg      MCHC 31.7 (L) g/dL      RDW 15 %      MPV 10.3 fL      Nucleated RBC 0.0 /100 WBC      Absolute NRBC 0.00 x10 3/uL     Narrative:       Per low intensity heparin protocol.    Basic Metabolic Panel [[id number]]  (Abnormal) Collected:  08/20/16 0604    Specimen:  Blood Updated:  08/20/16 0638     Glucose 168 (H) mg/dL      BUN 81.1 (H) mg/dL      Creatinine 1.8 (H) mg/dL      Calcium 8.9 mg/dL      Sodium 914 mEq/L      Potassium 5.0 mEq/L      Chloride 110 mEq/L      CO2 23 mEq/L     Narrative:       Per low intensity heparin protocol.    Magnesium [[id number]] Collected:  08/20/16 0604    Specimen:  Blood Updated:  08/20/16 0865     Magnesium 2.1 mg/dL     Narrative:       Per low intensity heparin protocol.    Phosphorus [[id number]]  (Abnormal) Collected:  08/20/16 0604    Specimen:  Blood Updated:  08/20/16 2841     Phosphorus 4.8 (H) mg/dL     Narrative:       Per low intensity heparin protocol.    GFR [[id number]] Collected:  08/20/16 0604     Updated:  08/20/16 2536     EGFR 27.2    Narrative:       Per low intensity heparin protocol.    Glucose Whole Blood - POCT [[id number]]   (Abnormal) Collected:  08/19/16 2101     Updated:  08/20/16 0513     POCT - Glucose Whole blood 213 (H) mg/dL     APTT [[id number]]  (Abnormal) Collected:  08/19/16 2205     Updated:  08/19/16 2304     PTT 54 (H) sec      APTT Anticoag. Given w/i 48 hrs. heparin    APTT [[id number]]  (Abnormal) Collected:  08/19/16 1601     Updated:  08/19/16 1652     PTT 77 (H) sec      APTT Anticoag. Given w/i 48 hrs. heparin    Glucose Whole Blood - POCT [[id number]]  (Abnormal) Collected:  08/19/16 1606     Updated:  08/19/16 1613     POCT - Glucose Whole blood 206 (H) mg/dL             Radiology:     Radiology Results (24 Hour)     ** No results found for the last 24 hours. **            Assessment:     Patient Active Problem List   Diagnosis   . Hydronephrosis of right kidney   . Cerebrovascular accident (CVA), unspecified mechanism   . Atrial myxoma   . Bilateral hydronephrosis   . UTI (urinary tract infection)   . AKI (acute kidney injury)         Plan:   # Left  MCA Syndrome with ACUTE CVA  - Continue IV Heparin gtt (without Bolus) - as instructed by Neurology - until CT surgery this Fri at 7am  - Cath done - cleared for surgery  - Continue Lipitor 40mg   - TEE confirms: Large RA mass taking up most or FRA cavity but not obstructing or prolapsing across the TV orifice. Size: at least 5.5 cm in height and 3.5 cm in width.  - Continue on cardiac monitor  - Noted MRI/MRA results    # R Atrial Myxoma  - Noted on Echo and confirmed on TEE  - TEE confirms: Large RA mass taking up most or FRA cavity but not obstructing or prolapsing across the TV orifice. Size: at least 5.5 cm in height and 3.5 cm in width.  - Appreciate Cardiology recommendations  - CT Chest/Abdomen/Pelvis noted  - CT Surgery - for OR later this Fri    # AKI  - Renal function fluctuating - but today stable  - Keep Foley in for now  - Discussed with urology - no plans for any acute intervention  - Appreciate Nephrology input   - Continue IVFs for now (managed by  Nephrology)   - Lasix renal scan reviewed     # Bilateral Hydronephrosis   - Continue Foley as per discussion with urology    # UTI  - Completed course of IV Ceftriaxone     # HTN  - Continue Metoprolol 100mg    - Continue Amlodipine 10mg   - Needs good and aggressive BP control -- well controlled on the new regimen  - Continue to monitor BP    # DM2  - Continue SSI      DVT Prophylaxis: IV Heparin drip  Diet: Cardiac Diet - will be NPO for surgery in the morning  Anticipated discharge disposition and date:  Later this week (end of week)    Signed by: Gildardo Griffes, D.O.   cc:Pcp, Noneorunknown, MD

## 2016-08-20 NOTE — Plan of Care (Signed)
Patient is a/ox4, denies pain. CHG bath done this evening. Midline dressing changed. Foley care done. Incontinent of stool today. Pre-op x-ray done. Heparin running @ 13u/kg/hr. Per NP Shanna Cisco, patient my go to pre-op with the heparin drip still running.

## 2016-08-21 ENCOUNTER — Ambulatory Visit: Payer: Self-pay

## 2016-08-21 ENCOUNTER — Inpatient Hospital Stay: Payer: Medicare Other | Admitting: Anesthesiology

## 2016-08-21 ENCOUNTER — Inpatient Hospital Stay: Payer: Medicare Other

## 2016-08-21 ENCOUNTER — Encounter
Admission: EM | Disposition: A | Discharge status: Rehabilitation Facility | Payer: Self-pay | Source: Home / Self Care | Attending: Internal Medicine

## 2016-08-21 LAB — BLOOD GAS, ARTERIAL
Arterial Total CO2: 24.5 mEq/L (ref 24.0–30.0)
Arterial Total CO2: 24.3 mEq/L (ref 24.0–30.0)
Base Excess, Arterial: -3.1 mEq/L — ABNORMAL LOW (ref <=2.0)
Base Excess, Arterial: -0.9 mEq/L (ref <=2.0)
FIO2: 100 %
FIO2: 40 %
HCO3, Arterial: 23 mEq/L (ref 23.0–29.0)
HCO3, Arterial: 23.1 mEq/L (ref 23.0–29.0)
O2 Sat, Arterial: >100 % (ref 95.0–100.0)
O2 Sat, Arterial: 99.5 % (ref 95.0–100.0)
PEEP: 5
PEEP: 5
Pressure Support: 5
Rate: 10
Rate: 19
Temperature: 33.4
Temperature: 35.9
Tidal vol.: 418
Tidal vol.: 500
pCO2, Arterial: 41 mmhg (ref 35.0–45.0)
pCO2, Arterial: 36.1 mmhg (ref 35.0–45.0)
pH, Arterial: 7.345 — ABNORMAL LOW (ref 7.350–7.450)
pH, Arterial: 7.417 (ref 7.350–7.450)
pO2, Arterial: 399 mmhg — ABNORMAL HIGH (ref 80.0–90.0)
pO2, Arterial: 96.6 mmhg — ABNORMAL HIGH (ref 80.0–90.0)

## 2016-08-21 LAB — COOXIMETRY PROFILE
Carboxyhemoglobin: 1.3 % (ref 0.0–3.0)
Carboxyhemoglobin: 1.7 % (ref 0.0–3.0)
Carboxyhemoglobin: 1.9 % (ref 0.0–3.0)
Carboxyhemoglobin: 1.6 % (ref 0.0–3.0)
Hematocrit Total Calculated: 23.6 % — ABNORMAL LOW (ref 37.0–47.0)
Hematocrit Total Calculated: 19.8 % — ABNORMAL LOW (ref 37.0–47.0)
Hematocrit Total Calculated: 22.5 % — ABNORMAL LOW (ref 37.0–47.0)
Hematocrit Total Calculated: 29.5 % — ABNORMAL LOW (ref 37.0–47.0)
Hemoglobin Total: 7.6 g/dL — ABNORMAL LOW (ref 12.0–16.0)
Hemoglobin Total: 6.3 g/dL — ABNORMAL LOW (ref 12.0–16.0)
Hemoglobin Total: 7.2 g/dL — ABNORMAL LOW (ref 12.0–16.0)
Hemoglobin Total: 9.5 g/dL — ABNORMAL LOW (ref 12.0–16.0)
Methemoglobin: 0.6 % (ref 0.0–3.0)
Methemoglobin: 0.3 % (ref 0.0–3.0)
Methemoglobin: 0.6 % (ref 0.0–3.0)
Methemoglobin: 0.6 % (ref 0.0–3.0)
O2 Content: 9.1
O2 Content: 7.9
O2 Content: 9.8
O2 Content: 10
Oxygenated Hemoglobin: 84.8 % — ABNORMAL LOW (ref 85.0–98.0)
Oxygenated Hemoglobin: 88 % (ref 85.0–98.0)
Oxygenated Hemoglobin: 95 % (ref 85.0–98.0)
Oxygenated Hemoglobin: 75 % — ABNORMAL LOW (ref 85.0–98.0)

## 2016-08-21 LAB — BLOOD GAS, VENOUS
Base Excess, Ven: -5.5 mEq/L
Base Excess, Ven: -3.3 mEq/L
Base Excess, Ven: 0.3 mEq/L
Base Excess, Ven: -0.2 mEq/L
HCO3, Ven: 20.5 mEq/L
HCO3, Ven: 22.6 mEq/L
HCO3, Ven: 26.1 mEq/L
HCO3, Ven: 25.4 mEq/L
O2 Sat, Venous: 86.5 %
O2 Sat, Venous: 90.1 %
O2 Sat, Venous: 97.1 %
O2 Sat, Venous: 76.6 %
Temperature: 37
Temperature: 37
Temperature: 37
Temperature: 37
Venous Total CO2: 21.9 mEq/L
Venous Total CO2: 24.2 mEq/L
Venous Total CO2: 27.7 mEq/L
Venous Total CO2: 26.9 mEq/L
pCO2, Venous: 46.1 mmhg
pCO2, Venous: 50.1 mmhg
pCO2, Venous: 52.8 mmhg
pCO2, Venous: 49 mmhg
pH, Ven: 7.271
pH, Ven: 7.277
pH, Ven: 7.315
pH, Ven: 7.334
pO2, Venous: 58.5 mmhg
pO2, Venous: 64.4 mmhg
pO2, Venous: 88.2 mmhg
pO2, Venous: 42.7 mmhg

## 2016-08-21 LAB — ABG WITH NA/K/CA IONIZED
Arterial Total CO2: 22.1 mEq/L — ABNORMAL LOW (ref 24.0–30.0)
Arterial Total CO2: 19.9 mEq/L — ABNORMAL LOW (ref 24.0–30.0)
Arterial Total CO2: 22.9 mEq/L — ABNORMAL LOW (ref 24.0–30.0)
Arterial Total CO2: 25.7 mEq/L (ref 24.0–30.0)
Arterial Total CO2: 24.7 mEq/L (ref 24.0–30.0)
Arterial Total CO2: 25.6 mEq/L (ref 24.0–30.0)
Base Excess, Arterial: -3.9 mEq/L — ABNORMAL LOW (ref <=2.0)
Base Excess, Arterial: -6 mEq/L — ABNORMAL LOW (ref <=2.0)
Base Excess, Arterial: -0.4 mEq/L (ref <=2.0)
Base Excess, Arterial: -4 mEq/L — ABNORMAL LOW (ref <=2.0)
Base Excess, Arterial: -0.6 mEq/L (ref <=2.0)
Base Excess, Arterial: -0.8 mEq/L (ref <=2.0)
Calcium, Ionized: 2.47 mEq/L (ref 2.30–2.58)
Calcium, Ionized: 2.22 mEq/L — ABNORMAL LOW (ref 2.30–2.58)
Calcium, Ionized: 2.01 mEq/L — ABNORMAL LOW (ref 2.30–2.58)
Calcium, Ionized: 2.2 mEq/L — ABNORMAL LOW (ref 2.30–2.58)
Calcium, Ionized: 1.95 mEq/L — ABNORMAL LOW (ref 2.30–2.58)
Calcium, Ionized: 2.53 mEq/L (ref 2.30–2.58)
HCO3, Arterial: 20.9 mEq/L — ABNORMAL LOW (ref 23.0–29.0)
HCO3, Arterial: 18.8 mEq/L — ABNORMAL LOW (ref 23.0–29.0)
HCO3, Arterial: 21.5 mEq/L — ABNORMAL LOW (ref 23.0–29.0)
HCO3, Arterial: 24.4 mEq/L (ref 23.0–29.0)
HCO3, Arterial: 23.4 mEq/L (ref 23.0–29.0)
HCO3, Arterial: 24.3 mEq/L (ref 23.0–29.0)
O2 Sat, Arterial: >100 % (ref 95.0–100.0)
O2 Sat, Arterial: 100 % (ref 95.0–100.0)
O2 Sat, Arterial: 100 % (ref 95.0–100.0)
O2 Sat, Arterial: >100 % (ref 95.0–100.0)
O2 Sat, Arterial: 100 % (ref 95.0–100.0)
O2 Sat, Arterial: >100 % (ref 95.0–100.0)
Temperature: 37
Temperature: 37
Temperature: 37
Temperature: 37
Temperature: 37
Temperature: 37
Whole Blood Potassium: 4.7 mEq/L (ref 3.5–5.3)
Whole Blood Potassium: 4.7 mEq/L (ref 3.5–5.3)
Whole Blood Potassium: 3.2 mEq/L — ABNORMAL LOW (ref 3.5–5.3)
Whole Blood Potassium: 3.5 mEq/L (ref 3.5–5.3)
Whole Blood Potassium: 4.1 mEq/L (ref 3.5–5.3)
Whole Blood Potassium: 4.2 mEq/L (ref 3.5–5.3)
Whole Blood Sodium: 140 mEq/L (ref 136–146)
Whole Blood Sodium: 137 mEq/L (ref 136–146)
Whole Blood Sodium: 144 mEq/L (ref 136–146)
Whole Blood Sodium: 144 mEq/L (ref 136–146)
Whole Blood Sodium: 143 mEq/L (ref 136–146)
Whole Blood Sodium: 144 mEq/L (ref 136–146)
pCO2, Arterial: 39.2 mmhg (ref 35.0–45.0)
pCO2, Arterial: 36.6 mmhg (ref 35.0–45.0)
pCO2, Arterial: 44 mmhg (ref 35.0–45.0)
pCO2, Arterial: 45.1 mmhg — ABNORMAL HIGH (ref 35.0–45.0)
pCO2, Arterial: 39.6 mmhg (ref 35.0–45.0)
pCO2, Arterial: 43.8 mmhg (ref 35.0–45.0)
pH, Arterial: 7.347 — ABNORMAL LOW (ref 7.350–7.450)
pH, Arterial: 7.331 — ABNORMAL LOW (ref 7.350–7.450)
pH, Arterial: 7.299 — ABNORMAL LOW (ref 7.350–7.450)
pH, Arterial: 7.362 (ref 7.350–7.450)
pH, Arterial: 7.363 (ref 7.350–7.450)
pH, Arterial: 7.39 (ref 7.350–7.450)
pO2, Arterial: 398 mmhg — ABNORMAL HIGH (ref 80.0–90.0)
pO2, Arterial: 367 mmhg — ABNORMAL HIGH (ref 80.0–90.0)
pO2, Arterial: 268 mmhg — ABNORMAL HIGH (ref 80.0–90.0)
pO2, Arterial: 277 mmhg — ABNORMAL HIGH (ref 80.0–90.0)
pO2, Arterial: 275 mmhg — ABNORMAL HIGH (ref 80.0–90.0)
pO2, Arterial: 275 mmhg — ABNORMAL HIGH (ref 80.0–90.0)

## 2016-08-21 LAB — CBC
Absolute NRBC: 0 10*3/uL
Absolute NRBC: 0 10*3/uL
Hematocrit: 33.5 % — ABNORMAL LOW (ref 37.0–47.0)
Hematocrit: 31.8 % — ABNORMAL LOW (ref 37.0–47.0)
Hgb: 10.4 g/dL — ABNORMAL LOW (ref 12.0–16.0)
Hgb: 10.6 g/dL — ABNORMAL LOW (ref 12.0–16.0)
MCH: 27.7 pg — ABNORMAL LOW (ref 28.0–32.0)
MCH: 29.4 pg (ref 28.0–32.0)
MCHC: 31 g/dL — ABNORMAL LOW (ref 32.0–36.0)
MCHC: 33.3 g/dL (ref 32.0–36.0)
MCV: 89.1 fL (ref 80.0–100.0)
MCV: 88.1 fL (ref 80.0–100.0)
MPV: 10.4 fL (ref 9.4–12.3)
MPV: 10.2 fL (ref 9.4–12.3)
Nucleated RBC: 0 /100 WBC (ref 0.0–1.0)
Nucleated RBC: 0 /100 WBC (ref 0.0–1.0)
Platelets: 351 10*3/uL (ref 140–400)
Platelets: 184 10*3/uL (ref 140–400)
RBC: 3.76 10*6/uL — ABNORMAL LOW (ref 4.20–5.40)
RBC: 3.61 10*6/uL — ABNORMAL LOW (ref 4.20–5.40)
RDW: 16 % — ABNORMAL HIGH (ref 12–15)
RDW: 15 % (ref 12–15)
WBC: 8.77 10*3/uL (ref 3.50–10.80)
WBC: 15.69 10*3/uL — ABNORMAL HIGH (ref 3.50–10.80)

## 2016-08-21 LAB — RED BLOOD CELLS OR HOLD
Expiration Date: 201710292359
Expiration Date: 201710302359
Expiration Date: 201710302359
Expiration Date: 201710302359
Status: TRANSFUSED
Status: TRANSFUSED
UTYPE: B POS
UTYPE: B POS
UTYPE: B POS
UTYPE: B POS

## 2016-08-21 LAB — TOTAL HEMOGLOBIN GROUP
Hematocrit Total Calculated: 30 % — ABNORMAL LOW (ref 37.0–47.0)
Hematocrit Total Calculated: 22.9 % — ABNORMAL LOW (ref 37.0–47.0)
Hematocrit Total Calculated: 19.8 % — ABNORMAL LOW (ref 37.0–47.0)
Hematocrit Total Calculated: 22.5 % — ABNORMAL LOW (ref 37.0–47.0)
Hematocrit Total Calculated: 30.5 % — ABNORMAL LOW (ref 37.0–47.0)
Hematocrit Total Calculated: 30.9 % — ABNORMAL LOW (ref 37.0–47.0)
Hemoglobin Total: 9.7 g/dL — ABNORMAL LOW (ref 12.0–16.0)
Hemoglobin Total: 7.3 g/dL — ABNORMAL LOW (ref 12.0–16.0)
Hemoglobin Total: 6.3 g/dL — ABNORMAL LOW (ref 12.0–16.0)
Hemoglobin Total: 7.2 g/dL — ABNORMAL LOW (ref 12.0–16.0)
Hemoglobin Total: 10 g/dL — ABNORMAL LOW (ref 12.0–16.0)
Hemoglobin Total: 9.9 g/dL — ABNORMAL LOW (ref 12.0–16.0)

## 2016-08-21 LAB — BASIC METABOLIC PANEL
BUN: 29 mg/dL — ABNORMAL HIGH (ref 7.0–19.0)
BUN: 24 mg/dL — ABNORMAL HIGH (ref 7.0–19.0)
CO2: 20 mEq/L — ABNORMAL LOW (ref 22–29)
CO2: 21 mEq/L — ABNORMAL LOW (ref 22–29)
Calcium: 8.9 mg/dL (ref 7.9–10.2)
Calcium: 8.5 mg/dL (ref 7.9–10.2)
Chloride: 112 mEq/L — ABNORMAL HIGH (ref 100–111)
Chloride: 109 mEq/L (ref 100–111)
Creatinine: 1.6 mg/dL — ABNORMAL HIGH (ref 0.6–1.0)
Creatinine: 1.3 mg/dL — ABNORMAL HIGH (ref 0.6–1.0)
Glucose: 145 mg/dL — ABNORMAL HIGH (ref 70–100)
Glucose: 161 mg/dL — ABNORMAL HIGH (ref 70–100)
Potassium: 5.2 mEq/L — ABNORMAL HIGH (ref 3.5–5.1)
Potassium: 3.8 mEq/L (ref 3.5–5.1)
Sodium: 141 mEq/L (ref 136–145)
Sodium: 145 mEq/L (ref 136–145)

## 2016-08-21 LAB — HEPARIN ASSAY
Heparin Assay Test Concentration: 3.5 mg/kg
Heparin Assay Test Concentration: 3.5 mg/kg
Heparin Assay Test Concentration: 3.5 mg/kg
Heparin Assay Test Concentration: 3.5 mg/kg
Heparin Assay Test Concentration: 3.5 mg/kg
Heparin Assay Test Concentration: 3.5 mg/kg
Heparin Assay Test Concentration: 0 mg/kg
Heparin Assay Test Concentration: 3.5 mg/kg
Total Protamine Dose: 0 mg
Total Protamine Dose: 0 mg
Total Protamine Dose: 0 mg
Total Protamine Dose: 0 mg
Total Protamine Dose: 0 mg
Total Protamine Dose: 0 mg
Total Protamine Dose: 0 mg
Total Protamine Dose: 300 mg
Total Units of Heparin Required: 30000
Total Units of Heparin Required: 0
Total Units of Heparin Required: 5000
Total Units of Heparin Required: 5000
Total Units of Heparin Required: 0
Total Units of Heparin Required: 0
Total Units of Heparin Required: 0
Total Units of Heparin Required: 0

## 2016-08-21 LAB — APTT: PTT: 53 s — ABNORMAL HIGH (ref 23–37)

## 2016-08-21 LAB — GFR
EGFR: 31.2
EGFR: 39.6

## 2016-08-21 LAB — PT AND APTT
PT INR: 1.4 — ABNORMAL HIGH (ref 0.9–1.1)
PT: 17.1 s — ABNORMAL HIGH (ref 12.6–15.0)
PTT: 30 s (ref 23–37)

## 2016-08-21 LAB — ACTIVATED CLOTTING TIME
ACT POCT: 147 — ABNORMAL HIGH (ref 85–120)
ACT POCT: 417 — ABNORMAL HIGH (ref 85–120)
ACT POCT: 451 — ABNORMAL HIGH (ref 85–120)
ACT POCT: 564 — ABNORMAL HIGH (ref 85–120)
ACT POCT: 620 — ABNORMAL HIGH (ref 85–120)
ACT POCT: 650 — ABNORMAL HIGH (ref 85–120)
ACT POCT: 127 — ABNORMAL HIGH (ref 85–120)
ACT POCT: 600 — ABNORMAL HIGH (ref 85–120)

## 2016-08-21 LAB — GLUCOSE WHOLE BLOOD
Whole Blood Glucose: 183 mg/dL — ABNORMAL HIGH (ref 70–100)
Whole Blood Glucose: 129 mg/dL — ABNORMAL HIGH (ref 70–100)
Whole Blood Glucose: 103 mg/dL — ABNORMAL HIGH (ref 70–100)
Whole Blood Glucose: 120 mg/dL — ABNORMAL HIGH (ref 70–100)
Whole Blood Glucose: 121 mg/dL — ABNORMAL HIGH (ref 70–100)
Whole Blood Glucose: 129 mg/dL — ABNORMAL HIGH (ref 70–100)

## 2016-08-21 LAB — TEG HEPARIN NEUTRALIZED
TEG Angle Neutralized: 76.6 (ref 55.2–78.4)
TEG CI Neutralized: 3.3 — ABNORMAL HIGH (ref <=3.00)
TEG K Time Neutralized: 0.9 (ref 0.8–2.8)
TEG R Time Neutralized: 5.8 (ref 2.5–7.5)
Teg MA Neutralized: 76.5 — ABNORMAL HIGH (ref 50.6–69.4)

## 2016-08-21 LAB — GLUCOSE WHOLE BLOOD - POCT
Whole Blood Glucose POCT: 107 mg/dL — ABNORMAL HIGH (ref 70–100)
Whole Blood Glucose POCT: 109 mg/dL — ABNORMAL HIGH (ref 70–100)
Whole Blood Glucose POCT: 111 mg/dL — ABNORMAL HIGH (ref 70–100)
Whole Blood Glucose POCT: 113 mg/dL — ABNORMAL HIGH (ref 70–100)
Whole Blood Glucose POCT: 119 mg/dL — ABNORMAL HIGH (ref 70–100)
Whole Blood Glucose POCT: 128 mg/dL — ABNORMAL HIGH (ref 70–100)
Whole Blood Glucose POCT: 129 mg/dL — ABNORMAL HIGH (ref 70–100)
Whole Blood Glucose POCT: 142 mg/dL — ABNORMAL HIGH (ref 70–100)
Whole Blood Glucose POCT: 150 mg/dL — ABNORMAL HIGH (ref 70–100)
Whole Blood Glucose POCT: 93 mg/dL (ref 70–100)

## 2016-08-21 LAB — PT/INR
PT INR: 1.2 — ABNORMAL HIGH (ref 0.9–1.1)
PT: 15.2 s — ABNORMAL HIGH (ref 12.6–15.0)

## 2016-08-21 LAB — MAGNESIUM
Magnesium: 1.8 mg/dL (ref 1.6–2.6)
Magnesium: 3.3 mg/dL — ABNORMAL HIGH (ref 1.6–2.6)

## 2016-08-21 LAB — PLATELET AGGREGATION CV OR
Functional Platelet Count: 51
Total Platelet Count CVOR: 167 (ref 140–400)

## 2016-08-21 LAB — PHOSPHORUS: Phosphorus: 4.7 mg/dL (ref 2.3–4.7)

## 2016-08-21 LAB — FIBRINOGEN: Fibrinogen: 263 mg/dL (ref 189–458)

## 2016-08-21 SURGERY — CLOSURE, PATENT FORAMEN OVALE (OPEN)
Anesthesia: Anesthesia General | Site: Esophagus | Wound class: Clean

## 2016-08-21 MED ORDER — NICARDIPINE IV BOLUS SYRINGE (ANESTHESIA)
INTRAVENOUS | Status: AC
Start: 2016-08-21 — End: ?
  Filled 2016-08-21: qty 10

## 2016-08-21 MED ORDER — SODIUM CHLORIDE 0.9% BAG (IRRIGATION USE)
INTRAVENOUS | Status: DC | PRN
Start: 2016-08-21 — End: 2016-08-21
  Administered 2016-08-21: 4000 mL

## 2016-08-21 MED ORDER — SODIUM CHLORIDE 0.9 % IV SOLN
INTRAVENOUS | Status: DC | PRN
Start: 2016-08-21 — End: 2016-08-21

## 2016-08-21 MED ORDER — SODIUM BICARBONATE 8.4 % IV SOLN
INTRAVENOUS | Status: AC
Start: 2016-08-21 — End: ?
  Filled 2016-08-21: qty 50

## 2016-08-21 MED ORDER — NITROGLYCERIN IN D5W 200-5 MCG/ML-% IV SOLN
10.0000 ug/min | INTRAVENOUS | Status: DC | PRN
Start: 2016-08-21 — End: 2016-08-22
  Administered 2016-08-21: 66.667 ug/min via INTRAVENOUS

## 2016-08-21 MED ORDER — TRANEXAMIC ACID 2000MG/100 ML ADULT BOLUS
Status: DC | PRN
Start: 2016-08-21 — End: 2016-08-21
  Administered 2016-08-21: 1316 mg via INTRAVENOUS

## 2016-08-21 MED ORDER — MIDAZOLAM HCL 2 MG/2ML IJ SOLN
INTRAMUSCULAR | Status: AC
Start: 2016-08-21 — End: ?
  Filled 2016-08-21: qty 4

## 2016-08-21 MED ORDER — CEFUROXIME SODIUM 1.5 G IJ/IV SOLR (WRAP)
Status: AC
Start: 2016-08-21 — End: ?
  Filled 2016-08-21: qty 1500

## 2016-08-21 MED ORDER — LUBRIFRESH P.M. OP OINT
TOPICAL_OINTMENT | OPHTHALMIC | Status: AC
Start: 2016-08-21 — End: ?
  Filled 2016-08-21: qty 3.5

## 2016-08-21 MED ORDER — OXYCODONE-ACETAMINOPHEN 5-325 MG PO TABS
1.0000 | ORAL_TABLET | ORAL | Status: DC | PRN
Start: 2016-08-21 — End: 2016-08-21

## 2016-08-21 MED ORDER — EPHEDRINE SULFATE 50 MG/ML IJ SOLN
INTRAMUSCULAR | Status: DC | PRN
Start: 2016-08-21 — End: 2016-08-21
  Administered 2016-08-21: 10 mg via INTRAVENOUS

## 2016-08-21 MED ORDER — LACTATED RINGERS IV BOLUS
250.0000 mL | INTRAVENOUS | Status: DC | PRN
Start: 2016-08-21 — End: 2016-08-23

## 2016-08-21 MED ORDER — DEXTROSE 10 % IV BOLUS
250.0000 mL | INTRAVENOUS | Status: DC | PRN
Start: 2016-08-21 — End: 2016-08-24

## 2016-08-21 MED ORDER — ATORVASTATIN CALCIUM 10 MG PO TABS
20.0000 mg | ORAL_TABLET | Freq: Every evening | ORAL | Status: DC
Start: 2016-08-21 — End: 2016-08-22
  Administered 2016-08-21: 20 mg via ORAL
  Filled 2016-08-21: qty 2

## 2016-08-21 MED ORDER — PHENYLEPHRINE 100 MCG/ML IN NACL 0.9% IV SOSY
PREFILLED_SYRINGE | INTRAVENOUS | Status: AC
Start: 2016-08-21 — End: ?
  Filled 2016-08-21: qty 10

## 2016-08-21 MED ORDER — ONDANSETRON 4 MG PO TBDP
4.0000 mg | ORAL_TABLET | Freq: Three times a day (TID) | ORAL | Status: DC | PRN
Start: 2016-08-21 — End: 2016-08-25

## 2016-08-21 MED ORDER — GLYCOPYRROLATE 0.2 MG/ML IJ SOLN
0.0100 mg/kg | Freq: Once | INTRAMUSCULAR | Status: AC
Start: 2016-08-21 — End: 2016-08-21
  Administered 2016-08-21: 0.658 mg via INTRAVENOUS
  Filled 2016-08-21: qty 4

## 2016-08-21 MED ORDER — PROPOFOL INFUSION 10 MG/ML
INTRAVENOUS | Status: DC | PRN
Start: 2016-08-21 — End: 2016-08-21
  Administered 2016-08-21: 50 ug/kg/min via INTRAVENOUS

## 2016-08-21 MED ORDER — LACTATED RINGERS IV SOLN
INTRAVENOUS | Status: DC | PRN
Start: 2016-08-21 — End: 2016-08-21

## 2016-08-21 MED ORDER — ROPIVACAINE HCL 5 MG/ML IJ SOLN
INTRAMUSCULAR | Status: DC | PRN
Start: 2016-08-21 — End: 2016-08-21
  Administered 2016-08-21: 20 mL via PERINEURAL

## 2016-08-21 MED ORDER — AMIODARONE HCL 150 MG/3ML IV SOLN
INTRAVENOUS | Status: AC
Start: 2016-08-21 — End: ?
  Filled 2016-08-21: qty 3

## 2016-08-21 MED ORDER — PROPOFOL 10 MG/ML IV EMUL (WRAP)
INTRAVENOUS | Status: AC
Start: 2016-08-21 — End: ?
  Filled 2016-08-21: qty 100

## 2016-08-21 MED ORDER — MIDAZOLAM HCL 2 MG/2ML IJ SOLN
INTRAMUSCULAR | Status: DC | PRN
Start: 2016-08-21 — End: 2016-08-21
  Administered 2016-08-21: 2 mg via INTRAVENOUS

## 2016-08-21 MED ORDER — CHLORHEXIDINE GLUCONATE 0.12 % MT SOLN
15.0000 mL | Freq: Once | OROMUCOSAL | Status: AC
Start: 2016-08-21 — End: 2016-08-21

## 2016-08-21 MED ORDER — ONDANSETRON HCL 4 MG/2ML IJ SOLN
4.0000 mg | Freq: Three times a day (TID) | INTRAMUSCULAR | Status: DC | PRN
Start: 2016-08-21 — End: 2016-08-25
  Administered 2016-08-21 – 2016-08-22 (×3): 4 mg via INTRAVENOUS
  Filled 2016-08-21 (×3): qty 2

## 2016-08-21 MED ORDER — PROTAMINE SULFATE 10 MG/ML IV SOLN
INTRAVENOUS | Status: AC
Start: 2016-08-21 — End: ?
  Filled 2016-08-21: qty 25

## 2016-08-21 MED ORDER — SODIUM CHLORIDE 0.9 % IV SOLN
INTRAVENOUS | Status: DC | PRN
Start: 2016-08-21 — End: 2016-08-21
  Administered 2016-08-21: 4 [IU]/h via INTRAVENOUS

## 2016-08-21 MED ORDER — PHENYLEPHRINE HCL 10 MG/ML IV SOLN (WRAP)
Status: DC | PRN
Start: 2016-08-21 — End: 2016-08-21
  Administered 2016-08-21 (×2): 100 ug via INTRAVENOUS
  Administered 2016-08-21: 200 ug via INTRAVENOUS
  Administered 2016-08-21 (×3): 100 ug via INTRAVENOUS
  Administered 2016-08-21 (×2): 200 ug via INTRAVENOUS
  Administered 2016-08-21 (×2): 100 ug via INTRAVENOUS

## 2016-08-21 MED ORDER — METOPROLOL TARTRATE 5 MG/5ML IV SOLN
INTRAVENOUS | Status: DC | PRN
Start: 2016-08-21 — End: 2016-08-21
  Administered 2016-08-21: 1 mg via INTRAVENOUS

## 2016-08-21 MED ORDER — SENNOSIDES-DOCUSATE SODIUM 8.6-50 MG PO TABS
1.0000 | ORAL_TABLET | Freq: Every evening | ORAL | Status: DC
Start: 2016-08-21 — End: 2016-08-22

## 2016-08-21 MED ORDER — ALBUMIN HUMAN 5 % IV SOLN
INTRAVENOUS | Status: AC
Start: 2016-08-21 — End: ?
  Filled 2016-08-21: qty 500

## 2016-08-21 MED ORDER — PROTAMINE SULFATE 10 MG/ML IV SOLN
INTRAVENOUS | Status: DC | PRN
Start: 2016-08-21 — End: 2016-08-21
  Administered 2016-08-21: 300 mg via INTRAVENOUS

## 2016-08-21 MED ORDER — ASPIRIN 325 MG PO TBEC
325.0000 mg | DELAYED_RELEASE_TABLET | Freq: Every day | ORAL | Status: DC
Start: 2016-08-22 — End: 2016-08-24
  Administered 2016-08-22 – 2016-08-24 (×3): 325 mg via ORAL
  Filled 2016-08-21 (×3): qty 1

## 2016-08-21 MED ORDER — LIDOCAINE HCL 1 % IJ SOLN
INTRAMUSCULAR | Status: AC
Start: 2016-08-21 — End: ?
  Filled 2016-08-21: qty 10

## 2016-08-21 MED ORDER — POTASSIUM CHLORIDE 20 MEQ/50ML IV SOLN
20.0000 meq | Freq: Once | INTRAVENOUS | Status: DC
Start: 2016-08-21 — End: 2016-08-22

## 2016-08-21 MED ORDER — AMIODARONE HCL 200 MG PO TABS
200.0000 mg | ORAL_TABLET | Freq: Two times a day (BID) | ORAL | Status: DC
Start: 2016-08-21 — End: 2016-08-25
  Administered 2016-08-21 – 2016-08-25 (×8): 200 mg via ORAL
  Filled 2016-08-21 (×8): qty 1

## 2016-08-21 MED ORDER — METOPROLOL TARTRATE 25 MG PO TABS
12.5000 mg | ORAL_TABLET | Freq: Two times a day (BID) | ORAL | Status: DC
Start: 2016-08-22 — End: 2016-08-22
  Administered 2016-08-21: 12.5 mg via ORAL
  Filled 2016-08-21: qty 1

## 2016-08-21 MED ORDER — SUFENTANIL CITRATE 250 MCG/5ML IV SOLN
INTRAVENOUS | Status: AC
Start: 2016-08-21 — End: ?
  Filled 2016-08-21: qty 5

## 2016-08-21 MED ORDER — DEXAMETHASONE SOD PHOSPHATE PF 10 MG/ML IJ SOLN
INTRAMUSCULAR | Status: DC | PRN
Start: 2016-08-21 — End: 2016-08-21
  Administered 2016-08-21: 5 mg

## 2016-08-21 MED ORDER — ROCURONIUM BROMIDE 50 MG/5ML IV SOLN
INTRAVENOUS | Status: DC | PRN
Start: 2016-08-21 — End: 2016-08-21
  Administered 2016-08-21: 60 mg via INTRAVENOUS
  Administered 2016-08-21: 40 mg via INTRAVENOUS

## 2016-08-21 MED ORDER — SUFENTANIL CITRATE 250 MCG/5ML IV SOLN
INTRAVENOUS | Status: DC | PRN
Start: 2016-08-21 — End: 2016-08-21
  Administered 2016-08-21 (×3): 25 ug via INTRAVENOUS

## 2016-08-21 MED ORDER — DEXTROSE 10 % IV BOLUS
250.0000 mL | INTRAVENOUS | Status: DC | PRN
Start: 2016-08-21 — End: 2016-08-25

## 2016-08-21 MED ORDER — ROPIVACAINE HCL 2 MG/ML IJ SOLN
INTRAMUSCULAR | Status: DC
Start: 2016-08-21 — End: 2016-08-23
  Filled 2016-08-21: qty 550

## 2016-08-21 MED ORDER — ALBUMIN HUMAN 5 % IV SOLN
INTRAVENOUS | Status: DC | PRN
Start: 2016-08-21 — End: 2016-08-21

## 2016-08-21 MED ORDER — NEOSTIGMINE METHYLSULFATE 1 MG/ML IJ/IV SOLN (WRAP)
0.0500 mg/kg | Freq: Once | Status: AC
Start: 2016-08-21 — End: 2016-08-21
  Administered 2016-08-21: 3.29 mg via INTRAVENOUS
  Filled 2016-08-21: qty 10

## 2016-08-21 MED ORDER — ACETAMINOPHEN 325 MG PO TABS
650.0000 mg | ORAL_TABLET | ORAL | Status: DC | PRN
Start: 2016-08-21 — End: 2016-08-25
  Administered 2016-08-22: 650 mg via ORAL
  Filled 2016-08-21: qty 2

## 2016-08-21 MED ORDER — OXYCODONE-ACETAMINOPHEN 5-325 MG PO TABS
2.0000 | ORAL_TABLET | ORAL | Status: DC | PRN
Start: 2016-08-21 — End: 2016-08-23
  Administered 2016-08-22: 1 via ORAL
  Administered 2016-08-23: 2 via ORAL
  Filled 2016-08-21 (×2): qty 2

## 2016-08-21 MED ORDER — PROPRANOLOL HCL 10 MG PO TABS
10.0000 mg | ORAL_TABLET | Freq: Four times a day (QID) | ORAL | Status: DC
Start: 2016-08-21 — End: 2016-08-21
  Administered 2016-08-21: 10 mg via ORAL
  Filled 2016-08-21: qty 1

## 2016-08-21 MED ORDER — TRAMADOL HCL 50 MG PO TABS
50.0000 mg | ORAL_TABLET | ORAL | Status: DC | PRN
Start: 2016-08-21 — End: 2016-08-25
  Administered 2016-08-23 – 2016-08-24 (×2): 50 mg via ORAL
  Filled 2016-08-21 (×2): qty 1

## 2016-08-21 MED ORDER — SODIUM CHLORIDE 0.9 % IV SOLN
20.0000 meq | Freq: Once | INTRAVENOUS | Status: AC
Start: 2016-08-21 — End: 2016-08-21
  Administered 2016-08-21: 20 meq via INTRAVENOUS
  Filled 2016-08-21: qty 10

## 2016-08-21 MED ORDER — CHLORHEXIDINE GLUCONATE 0.12 % MT SOLN
15.0000 mL | Freq: Two times a day (BID) | OROMUCOSAL | Status: DC
Start: 2016-08-21 — End: 2016-08-23
  Administered 2016-08-21 – 2016-08-22 (×3): 15 mL via OROMUCOSAL
  Filled 2016-08-21 (×5): qty 15

## 2016-08-21 MED ORDER — CEFUROXIME SODIUM 1.5 G IJ/IV SOLR (WRAP)
Status: DC | PRN
Start: 2016-08-21 — End: 2016-08-21
  Administered 2016-08-21 (×2): 1.5 g via INTRAVENOUS

## 2016-08-21 MED ORDER — MAGNESIUM SULFATE IN D5W 1-5 GM/100ML-% IV SOLN
1.0000 g | INTRAVENOUS | Status: DC | PRN
Start: 2016-08-21 — End: 2016-08-21

## 2016-08-21 MED ORDER — PROPOFOL INFUSION 10 MG/ML
INTRAVENOUS | Status: DC | PRN
Start: 2016-08-21 — End: 2016-08-21
  Administered 2016-08-21: 50 mg via INTRAVENOUS
  Administered 2016-08-21: 20 mg via INTRAVENOUS

## 2016-08-21 MED ORDER — PHENYLEPHRINE 100 MCG/ML IN NACL 0.9% IV SOSY
PREFILLED_SYRINGE | INTRAVENOUS | Status: AC
Start: 2016-08-21 — End: ?
  Filled 2016-08-21: qty 5

## 2016-08-21 MED ORDER — HEPARIN SODIUM (PORCINE) 1000 UNIT/ML IJ SOLN
INTRAMUSCULAR | Status: DC | PRN
Start: 2016-08-21 — End: 2016-08-21
  Administered 2016-08-21: 5000 [IU] via INTRAVENOUS
  Administered 2016-08-21: 30000 [IU] via INTRAVENOUS

## 2016-08-21 MED ORDER — CALCIUM CHLORIDE 10 % IV SOLN
INTRAVENOUS | Status: AC
Start: 2016-08-21 — End: ?
  Filled 2016-08-21: qty 10

## 2016-08-21 MED ORDER — BISACODYL 10 MG RE SUPP
10.0000 mg | Freq: Every day | RECTAL | Status: DC | PRN
Start: 2016-08-21 — End: 2016-08-25

## 2016-08-21 MED ORDER — SODIUM CHLORIDE 0.9 % IV SOLN
INTRAVENOUS | Status: DC | PRN
Start: 2016-08-21 — End: 2016-08-21
  Administered 2016-08-21: 6 [IU] via INTRAVENOUS

## 2016-08-21 MED ORDER — PROPOFOL 10 MG/ML IV EMUL (WRAP)
INTRAVENOUS | Status: AC
Start: 2016-08-21 — End: ?
  Filled 2016-08-21: qty 20

## 2016-08-21 MED ORDER — HYDRALAZINE HCL 20 MG/ML IJ SOLN
10.0000 mg | INTRAMUSCULAR | Status: DC | PRN
Start: 2016-08-21 — End: 2016-08-23

## 2016-08-21 MED ORDER — CEFUROXIME SODIUM 1.5 G IJ/IV SOLR (WRAP)
1.5000 g | Freq: Three times a day (TID) | Status: AC
Start: 2016-08-21 — End: 2016-08-22
  Administered 2016-08-21 – 2016-08-22 (×3): 1.5 g via INTRAVENOUS
  Filled 2016-08-21 (×3): qty 1500

## 2016-08-21 MED ORDER — LIDOCAINE HCL 2 % IJ SOLN
INTRAMUSCULAR | Status: DC | PRN
Start: 2016-08-21 — End: 2016-08-21
  Administered 2016-08-21: 40 mg

## 2016-08-21 MED ORDER — METOCLOPRAMIDE HCL 5 MG/ML IJ SOLN
INTRAMUSCULAR | Status: AC
Start: 2016-08-21 — End: ?
  Filled 2016-08-21: qty 2

## 2016-08-21 MED ORDER — EPINEPHRINE HCL 1 MG/ML IJ SOLN (WRAP)
Status: DC | PRN
Start: 2016-08-21 — End: 2016-08-21
  Administered 2016-08-21: 12:00:00 2 ug/min via INTRAVENOUS

## 2016-08-21 MED ORDER — NOREPINEPHRINE BITARTRATE 1 MG/ML IV SOLN
INTRAVENOUS | Status: DC | PRN
Start: 2016-08-21 — End: 2016-08-21
  Administered 2016-08-21: 2 ug/min via INTRAVENOUS

## 2016-08-21 MED ORDER — MUPIROCIN CALCIUM 2 % NA OINT
TOPICAL_OINTMENT | Freq: Two times a day (BID) | NASAL | Status: DC
Start: 2016-08-21 — End: 2016-08-25
  Administered 2016-08-21 – 2016-08-25 (×9): 0.9 via NASAL
  Filled 2016-08-21 (×8): qty 0.9

## 2016-08-21 MED ORDER — ROCURONIUM BROMIDE 50 MG/5ML IV SOLN
INTRAVENOUS | Status: AC
Start: 2016-08-21 — End: ?
  Filled 2016-08-21: qty 10

## 2016-08-21 MED ORDER — PROPOFOL INFUSION 10 MG/ML
10.0000 ug/kg/min | INTRAVENOUS | Status: DC
Start: 2016-08-21 — End: 2016-08-22
  Administered 2016-08-21: 50 ug/kg/min via INTRAVENOUS

## 2016-08-21 MED ORDER — EPHEDRINE SULFATE 50 MG/ML IJ/IV SOLN (WRAP)
Status: AC
Start: 2016-08-21 — End: ?
  Filled 2016-08-21: qty 1

## 2016-08-21 MED ORDER — NOREPINEPHRINE 8 MG/100 ML (SIMPLE)
1.0000 ug/min | Status: DC | PRN
Start: 2016-08-21 — End: 2016-08-22
  Administered 2016-08-21: 2 ug/min via INTRAVENOUS

## 2016-08-21 MED ORDER — TRANEXAMIC ACID 1000 MG/10ML IV SOLN
INTRAVENOUS | Status: DC | PRN
Start: 2016-08-21 — End: 2016-08-21
  Administered 2016-08-21: 2 mg/kg/h via INTRAVENOUS

## 2016-08-21 MED ORDER — HYDROMORPHONE HCL 1 MG/ML IJ SOLN
0.2500 mg | INTRAMUSCULAR | Status: DC | PRN
Start: 2016-08-21 — End: 2016-08-21
  Administered 2016-08-21: 0.25 mg via INTRAVENOUS
  Filled 2016-08-21: qty 1

## 2016-08-21 MED ORDER — AMIODARONE HCL 150 MG/3ML IV SOLN
INTRAVENOUS | Status: DC | PRN
Start: 2016-08-21 — End: 2016-08-21
  Administered 2016-08-21: 150 mg via INTRAVENOUS

## 2016-08-21 MED ORDER — DULOXETINE HCL 30 MG PO CPEP
60.0000 mg | ORAL_CAPSULE | Freq: Every day | ORAL | Status: DC
Start: 2016-08-22 — End: 2016-08-25
  Administered 2016-08-22 – 2016-08-25 (×4): 60 mg via ORAL
  Filled 2016-08-21 (×2): qty 2
  Filled 2016-08-21: qty 1
  Filled 2016-08-21: qty 2

## 2016-08-21 MED ORDER — FAMOTIDINE 20 MG PO TABS
20.0000 mg | ORAL_TABLET | Freq: Every day | ORAL | Status: AC
Start: 2016-08-21 — End: 2016-08-22
  Administered 2016-08-21 – 2016-08-22 (×2): 20 mg via ORAL
  Filled 2016-08-21 (×2): qty 1

## 2016-08-21 MED ORDER — CHLORHEXIDINE GLUCONATE 0.12 % MT SOLN
OROMUCOSAL | Status: AC
Start: 2016-08-21 — End: 2016-08-21
  Administered 2016-08-21: 15 mL via OROMUCOSAL
  Filled 2016-08-21: qty 15

## 2016-08-21 MED ORDER — ON-Q PUMP SINGLE FLOW
Status: DC
Start: 2016-08-21 — End: 2016-08-21
  Filled 2016-08-21: qty 550

## 2016-08-21 MED ORDER — HYDROMORPHONE HCL 1 MG/ML IJ SOLN
0.5000 mg | INTRAMUSCULAR | Status: DC | PRN
Start: 2016-08-21 — End: 2016-08-22
  Administered 2016-08-21 (×3): 0.5 mg via INTRAVENOUS
  Filled 2016-08-21 (×3): qty 1

## 2016-08-21 MED ORDER — SODIUM CHLORIDE 0.9 % IV SOLN
1.0000 [IU]/h | INTRAVENOUS | Status: DC
Start: 2016-08-21 — End: 2016-08-24
  Administered 2016-08-21 – 2016-08-23 (×2): 1 [IU]/h via INTRAVENOUS
  Filled 2016-08-21: qty 1

## 2016-08-21 MED ORDER — SODIUM PHOSPHATES 3 MMOLE/ML IV SOLN (WRAP)
15.0000 mmol | INTRAVENOUS | Status: DC | PRN
Start: 2016-08-21 — End: 2016-08-21

## 2016-08-21 MED ORDER — SODIUM PHOSPHATES 3 MMOLE/ML IV SOLN (WRAP)
25.0000 mmol | INTRAVENOUS | Status: DC | PRN
Start: 2016-08-21 — End: 2016-08-21

## 2016-08-21 MED ORDER — SODIUM PHOSPHATES 3 MMOLE/ML IV SOLN (WRAP)
35.0000 mmol | INTRAVENOUS | Status: DC | PRN
Start: 2016-08-21 — End: 2016-08-21

## 2016-08-21 MED ORDER — SODIUM CHLORIDE 0.9 % IV SOLN
0.0000 mg/h | INTRAVENOUS | Status: DC | PRN
Start: 2016-08-21 — End: 2016-08-22
  Administered 2016-08-21: 2.5 mg/h via INTRAVENOUS
  Administered 2016-08-21 – 2016-08-22 (×4): 5 mg/h via INTRAVENOUS
  Filled 2016-08-21 (×4): qty 10

## 2016-08-21 MED ORDER — EPINEPHRINE 10 MCG/ML IV IN NS SYRINGE LOW DOSE (PEDS)
INJECTION | INTRAVENOUS | Status: AC
Start: 2016-08-21 — End: ?
  Filled 2016-08-21: qty 10

## 2016-08-21 MED ORDER — SODIUM CHLORIDE 0.9 % IV SOLN
INTRAVENOUS | Status: DC
Start: 2016-08-21 — End: 2016-08-24
  Administered 2016-08-22: 85 mL/h via INTRAVENOUS

## 2016-08-21 MED FILL — Lidocaine HCl Local Inj 1%: INTRAMUSCULAR | Qty: 20 | Status: AC

## 2016-08-21 MED FILL — Potassium Chloride Inj 2 mEq/ML: INTRAVENOUS | Qty: 20 | Status: AC

## 2016-08-21 MED FILL — Water For Inject, Bacteriostatic Benzyl Alcohol: INTRAMUSCULAR | Qty: 30 | Status: AC

## 2016-08-21 MED FILL — Magnesium Sulfate Inj 50%: INTRAMUSCULAR | Qty: 8 | Status: AC

## 2016-08-21 MED FILL — Methylprednisolone Sod Succ For Inj 500 MG (Base Equiv): INTRAMUSCULAR | Qty: 16 | Status: AC

## 2016-08-21 MED FILL — Sodium Bicarbonate IV Soln 8.4%: INTRAVENOUS | Qty: 300 | Status: AC

## 2016-08-21 MED FILL — Albumin, Human Inj 5%: INTRAVENOUS | Qty: 500 | Status: AC

## 2016-08-21 MED FILL — Calcium Chloride Inj 10%: INTRAVENOUS | Qty: 10 | Status: AC

## 2016-08-21 MED FILL — Mannitol IV Soln 25%: INTRAVENOUS | Qty: 50 | Status: AC

## 2016-08-21 MED FILL — Heparin Sodium (Porcine) Inj 1000 Unit/ML: INTRAMUSCULAR | Qty: 60 | Status: AC

## 2016-08-21 MED FILL — Vancomycin HCl For IV Soln 1 GM (Base Equivalent): INTRAVENOUS | Qty: 1000 | Status: AC

## 2016-08-21 SURGICAL SUPPLY — 94 items
ADAPTER PERFUSION 1/4IN (Perfusion Supplies) ×4 IMPLANT
ADHESIVE SKIN CLOSURE DERMABOND MINI .36 (Suture) ×2
ADHESIVE SKIN CLOSURE DERMABOND MINI .36 ML LIQUID APPLICATOR (Suture) ×2 IMPLANT
ADHESIVE SKNCLS 2 OCTYL CYNCRLT .36ML MN (Suture) ×2
BLADE STERNUM 32MM 8TTH/CM (Blade) ×4 IMPLANT
CABLE DISPOSABLE PACING (Cable) ×2
CABLE PACING SAFE-CONNECT L8 FT ATRIAL (Cable) ×2
CABLE PACING SAFE-CONNECT L8 FT ATRIAL VENTRICULAR SMALL ALLIGATOR (Cable) ×2 IMPLANT
CABLE PATIENT MYO/LEAD L1.8 MR WHITE (Cable) ×2 IMPLANT
CABLE PT MYOLD 1.8MR DISP WHT (Cable) ×2
CATHETER IV JELCO 14GA 2IN STRL RADOPQ (IV Supply) ×2
CATHETER IV OD14 GA L2 IN RADIOPAQUE (IV Supply) ×2
CATHETER IV OD14 GA L2 IN RADIOPAQUE JELCO (IV Supply) ×2 IMPLANT
CLIP INTERNAL LARGE CHEVRON HEART LIGATE (Clips) ×2
CLIP INTERNAL LARGE CHEVRON LIGATE TRIANGULATE CROSS SECTION HEART (Clips) ×2 IMPLANT
CLIP INTERNAL MEDIUM LARGE CHEVRON 6 (Clips) ×2 IMPLANT
CLIP INTNL TI LG CHEVRON HRT HRZN LF (Clips) ×2
CLIP INTNL TI MED LG CHEVRON WECK HRZN 6 (Clips) ×2
CLIP SURGICAL SPRING SOFT LIGH (Clips) ×8 IMPLANT
CONNECTOR PERFUSION REDUCE 3/8IN Y STERILE (Connector) ×2 IMPLANT
CONNECTOR PRFSN Y 3/8IN STRL RDC (Connector) ×4
DEVICE KNOT PUSHER 38CM (Endoscopic Supplies) ×4
DEVICE PUSHER KNOT CONTOURED ACTUATOR BUTTON STRGHT SHFT 1.1X20X38CM (Endoscopic Supplies) ×2 IMPLANT
DRAIN CHEST THORACIC TUBE DRY SUCTION (Drain) ×2
DRAIN CHEST THORACIC TUBE DRY SUCTION COLLECTION CHAMBER ATRIUM OASIS (Drain) ×2 IMPLANT
DRAIN INCS PLS ATR OAS LF STRL TUBE DRY (Drain) ×2
DRAIN INCS SIL RND 24FR 5/16IN LF STRL (Drain) ×4
DRAIN OD24 FR RADIOPAQUE 4 FREE FLOW (Drain) ×4
DRAIN OD24 FR RADIOPAQUE 4 FREE FLOW CHANNEL FULL FLUTE BLAKE L5/16 IN (Drain) ×4 IMPLANT
DRAPE MAG FM DVN 20X16IN LF STRL HNDFR (Drape) ×2
DRAPE MAGNETIC HAND FREE TRANSFER (Drape) ×2 IMPLANT
DRAPE SRG IOBN 23X17IN STRL 2 INCS FLM (Drape) ×4
DRAPE SURGICAL 2 INCISE FILM (Drape) ×4 IMPLANT
DRESSING HEMOSTAT 2X14IN ABSOR (Dressing) ×4 IMPLANT
DRESSING TRANSPARENT L4 3/4 IN X W4 IN (Dressing) ×2
DRESSING TRANSPARENT L4 3/4 IN X W4 IN POLYURETHANE ADHESIVE (Dressing) ×2 IMPLANT
DRESSING TRNS PU STD TGDRM 4.75X4IN LF (Dressing) ×2
ELECTRODE ELECTROSURGICAL BLADE L2.5 IN (Blade) ×2
ELECTRODE ELECTROSURGICAL BLADE L2.5 IN OD3/32 IN EDGE L1.1 IN (Blade) ×2 IMPLANT
ELECTRODE ESURG BLDE EDG 3/32IN 2.5IN LF (Blade) ×2
GAUZE SPONGE CURITY 12PLY 4X8 (Dressing) ×4 IMPLANT
GAUZE SPONGE VERSLN 4PLY 4X4IN (Dressing) ×4 IMPLANT
INSERT CLAMP LIGHT BLUE 66MM INTRACK ULTRA WHT INLAY SFT PAD EMBEDDED (Insert) ×2 IMPLANT
INSERT CLP INTRACK 66MM ULT WHT INLAY (Insert) ×4
PACK LOOP 3/8 (Pack) ×4 IMPLANT
PACK SET VALVE CUSTOM ~~LOC~~ (Pack) ×8 IMPLANT
PACK Y VENOUS (Pack) ×4 IMPLANT
PAD ELECTROSRG GRND REM W CRD (Procedure Accessories) ×4 IMPLANT
PLEDGET CARDIOVASCULAR L3/8 IN X W3/16 (Vascular) ×6
PLEDGET CARDIOVASCULAR L3/8 IN X W3/16 IN THK1/16 IN LARGE SOFT SUTURE (Vascular) ×6 IMPLANT
PLEDGET CV TFLN THK1/16IN LG 3/8X3/16IN (Vascular) ×6
PROTECTOR TISS MED ALEXIS LF FLXB RTRCT (Retractor) ×2
PROTECTOR TISSUE MEDIUM FLEXBLE (Retractor) ×2
PROTECTOR TISSUE MEDIUM FLEXIBLE (Retractor) ×2
RETRACTOR TISSUE MEDIUM FLEXIBLE RETRACTION RING ATRAUMATIC SELF (Retractor) ×2 IMPLANT
SCALPEL BARD-PARKER STAINLESS STEEL (Blade) ×4
SCALPEL BARD-PARKER STAINLESS STEEL PLASTIC SHARP BLADE SURGICAL 11 (Blade) ×2 IMPLANT
SCALPEL BARD-PARKER STAINLESS STEEL PLASTIC SHARP BLADE SURGICAL 15 (Blade) ×2 IMPLANT
SCALPEL SRG SS PLS 11 BP LF STRL SHRP (Blade) ×2
SCALPEL SRG SS PLS 15 BP LF STRL SHRP (Blade) ×2
SUMP INTRACARDIAC SUCTION MULTIPORT VENT (Perfusion Supplies) ×6
SUMP INTRACARDIAC SUCTION MULTIPORT VENT CONNECTOR MAYO STYLE OD20 FR (Perfusion Supplies) ×6 IMPLANT
SUMP INTRCRD SCT DLP 20FR .25IN ML PRT (Perfusion Supplies) ×6
SUTURE ABS 2 TP-1 VCL 54IN BRD COAT UD (Suture) ×2
SUTURE COATED VICRYL 2 TP-1 L54 IN BRAID (Suture) ×2 IMPLANT
SUTURE D SPECIAL PROLENE 4-0 (Suture) ×8 IMPLANT
SUTURE MONOCRYL 4-0 PS2 27IN (Suture) ×12 IMPLANT
SUTURE NABSB 4-0 RB1 PRLN 36IN 2 ARM MFL (Suture) ×6
SUTURE NABSB 4-0 SH-1 PRLN 30IN 2 ARM (Suture) ×2
SUTURE POLYPROPYLENE PROLENE BLUE 4-0 (Suture) ×6
SUTURE POLYPROPYLENE PROLENE BLUE 4-0 RB-1 1/2 CIRLE L36 IN 2 ARM (Suture) ×6 IMPLANT
SUTURE PROLENE 4-0 TF (Suture) ×8 IMPLANT
SUTURE PROLENE 5-0 RB2 30IN (Suture) ×16 IMPLANT
SUTURE PROLENE 5.0 (Suture) ×24 IMPLANT
SUTURE PROLENE BLUE 4-0 SH-1 L30 IN 2 (Suture) ×2
SUTURE PROLENE BLUE 4-0 SH-1 L30 IN 2 ARM MONOFILAMENT NONABSORBABLE (Suture) ×2 IMPLANT
SUTURE SILK 2.0 FSL 30IN (Suture) ×24 IMPLANT
SUTURE VICRYL 0 CT1 36IN (Suture) ×8 IMPLANT
SUTURE VICRYL 2-0 CT1 36IN (Suture) ×8 IMPLANT
SYRINGE LEUR LOK TIP 30 ML (Syringes, Needles) ×4 IMPLANT
TAPE SRG CLTH MDPR 10YDX2IN LF NS STD (Tape) ×4
TAPE SURGICAL L10 YD X W2 IN STANDARD (Tape) ×4
TAPE SURGICAL L10 YD X W2 IN STANDARD MEDIPORE CLOTH (Tape) ×4 IMPLANT
TIP SCT TPR BLBS MDVC YNKR LF STRL CNTRL (Suction) ×2
TIP SUCTION MEDI-VAC YANKAUER TAPER (Suction) ×2
TIP SUCTION MEDI-VAC YANKAUER TAPER BULBOUS CONTROL VENT HANDLE (Suction) ×2 IMPLANT
TOURNIQUET VASC STD DLP 5.5IN SET SNR (Procedure Accessories) ×2
TOURNIQUET VASCULAR L5.5 IN STANDARD SET (Procedure Accessories) ×2
TOURNIQUET VASCULAR L5.5 IN STANDARD SET SNARE TUBE DLP PEDIATRIC (Procedure Accessories) ×2 IMPLANT
TRAY SSTEP CATH UMETER 16FR (Tray) ×4 IMPLANT
TROCAR BLADELESS ENDO 5X75MM (Laparoscopy Supplies) ×4 IMPLANT
TUBING SUCTION UNIV 5/16X100FT (Tubing) ×4 IMPLANT
WIRE PACE MYO ATRIAL (W) (Pacemaker Supply) ×8 IMPLANT
WIRE PACING TEMPORARY (Procedure Accessories) ×8 IMPLANT

## 2016-08-21 NOTE — Plan of Care (Signed)
Problem: Safety  Goal: Patient will be free from injury during hospitalization  Outcome: Progressing   08/21/16 0157   Goal/Interventions addressed this shift   Patient will be free from injury during hospitalization  Assess patient's risk for falls and implement fall prevention plan of care per policy;Provide and maintain safe environment;Use appropriate transfer methods;Ensure appropriate safety devices are available at the bedside;Include patient/ family/ care giver in decisions related to safety;Hourly rounding   Call light and bedside table within reach.     Problem: Pain  Goal: Pain at adequate level as identified by patient  Outcome: Progressing   08/21/16 0157   Goal/Interventions addressed this shift   Pain at adequate level as identified by patient Assess pain on admission, during daily assessment and/or before any "as needed" intervention(s);Reassess pain within 30-60 minutes of any procedure/intervention, per Pain Assessment, Intervention, Reassessment (AIR) Cycle;Evaluate if patient comfort function goal is met;Evaluate patient's satisfaction with pain management progress   No complaints of pain    Problem: Every Day - Stroke  Goal: Neurological status is stable or improving  Outcome: Progressing   08/21/16 0157   Goal/Interventions addressed this shift   Neurological status is stable or improving Monitor/assess/document neurological assessment (Stroke: every 4 hours);Perform CAM Assessment   Alert and oriented X 3, VSS. Heparin gtt infusing @ 13 unit/kg/hr. MAE, OOB with assist X 1.     Comments: Pt remains NPO for surgery in the morning.

## 2016-08-21 NOTE — Anesthesia Procedure Notes (Signed)
TEE      Performed by: Fortunato Curling    Authorized by: Fortunato Curling       Procedure Info    Indication:  Myocardial Function, Surgeon Request and Other(comment) (Atrial Myxoma)  Surgery Class:  Other (Intracardiac mass)  Examiner:  Fortunato Curling  TEE CPT Codes:  [id number] Adult TEE with interpretation, 93320 Doppler ECHO and 16109 Doppler ECHO (Color Flow)  History of Esophageal Stricture?: No    History of Radiation Therapy?: No    Hemoptysis?: No    Probe Placement:  Atraumatic and Bite Block used    Aorta    Asc Aortic Disease:  Difuse  Asc Aortic Atheroma:  Grade 1  Desc Aortic Disease:  Diffuse  Desc Aortic Atheroma:  Grade 1    Pericardium    Pericardial Effusion:  None    Left Ventricle    LViD Exceeds 9cm: No    LVH-Wall exceeds 1cm: Yes    Ejection Fraction:  Normal  LV Thrombus:  None  LV Septum:  Normal  Pre-surgery RWM Anterior wall:  Normal  Pre-surgery RWM Lateral Wall:  Normal  Pre-surgery RWM Septum:  Normal  Pre-surgery RWM Inferior Wall:  Normal  Pre-surgery RWM Apex:  Normal  Post-surgery RWM Anterior wall:  Normal  Post-surgery RWM Lateral Wall:  Normal  Post-surgery RWM Septum:  Normal  Post-surgery RWM Inferior Wall:  Normal  Post-surgery RWM Apex:  Normal    Left Atrium    Pulmonary Vein Flow:  Restricted  Left Atrial Septum:  Deviated to R  ASD:  Secundum  LA Appendage:  Normal    Right Ventricle    RV Size:  Normal  RV Function:  Normal    Right Atrium    Right Atrium Enlargement: Yes    : Seen in intrahepatic IVC proximal to mass.    Aortic Valve    Native AV Type:  Tricuspid  Native AV:  Normal    AR Grade:  None    Mitral Valve    Native Mitral Valve Type:  Normal  Native Mitral Valve:  Normal and Central Regurg    MR Grade:  1+    Tricuspid Valve    Native Tricuspid Valve Type:  Normal  Native Tricuspid Valve:  Normal  TR Grade:  None    Intracardiac Masses    RA Mass:  Tumor (5.8 x 2.7cm that appears to originate posteriorly, no stalk noted attached to interatrial septum.)

## 2016-08-21 NOTE — Anesthesia Procedure Notes (Signed)
Peripheral    Patient location during procedure: ICU  Reason for block: Post-op pain managment  Injection technique: Catheter  Block Region: Paravertebral - Thoracic  Laterality: Right  Block at surgeon's request Yes    Staffing  Anesthesiologist: Rupert Stacks P  Performed: Anesthesiologist     Pre-procedure Checklist   Completed: patient identified, surgical consent, pre-op evaluation, timeout performed, risks and benefits discussed, anesthesia consent given and correct site      Peripheral Block  Patient monitoring: NIBP, Pulse oximetry, EKG and Circuit O2  Patient position: Left lateral decubitus  Premedication: Yes and Meaningful contact maintained  Local infiltration: Lidocaine 1%    Needle  Needle type: Tuohy   Needle gauge: 18 G  Needle length: 10 cm  Catheter size: 20 G  Catheter at skin depth: 9 cm    Procedures: ultrasound guided  Ultrasound Guided: LA spread visualized, Image stored or printed, Needle visualized, Relevant anatomy identified (nerve, vessels, muscle), Air contrast visualized and Catheter visualized      Assessment   Incremental injection: yes  Injection made incrementally with aspirations every 5 mL.  Injection Resistance: no  Paresthesia Pain: no    Blood Aspirated: No  no suspected intravascular injection  Patient tolerated procedure well: Yes  Block Outcome: No complications, Successful block and Pain improved

## 2016-08-21 NOTE — Progress Notes (Signed)
Patient seen and examined, H&P reviewed.  No interval changes of significance.  Discussed operative plan with patient and son.  Informed consent obtained and on chart.    Edenilson Austad L. Garen Grams, MD  Cardiac Surgeon

## 2016-08-21 NOTE — Anesthesia Preprocedure Evaluation (Addendum)
Anesthesia Evaluation    AIRWAY    Mallampati: I    TM distance: >3 FB  Neck ROM: full  Mouth Opening:full   CARDIOVASCULAR    cardiovascular exam normal       DENTAL    no notable dental hx     PULMONARY    pulmonary exam normal     OTHER FINDINGS    Patient with right atrial myxoma s/p CABG in 2010 in [place]. Worked up on initial symptoms of CVA, then found this myxoma. Occludes the IVC and SVC to a great degree. EF is preserved.    ===============================================================  Anesthesia Inpatient Evaluation    Patient Name: [name],[name]  Surgeon: Christene Lye, MD  Patient Age / Sex: 80 y.o. / female    Recent Labs in EPIC:  CBC:  Lab Results       Component                Value               Date                       WBC                      8.77                08/21/2016                 HGB                      10.4 (L)            08/21/2016                 HCT                      33.5 (L)            08/21/2016                 PLT                      351                 08/21/2016              Chemistries:  Lab Results       Component                Value               Date                       NA                       141                 08/21/2016                 K                        5.2 (H)             08/21/2016                 CL  112 (H)             08/21/2016                 CO2                      20 (L)              08/21/2016                 BUN                      29.0 (H)            08/21/2016                 CREAT                    1.6 (H)             08/21/2016                 GLU                      145 (H)             08/21/2016                 HGBA1C                   7.7 (H)             08/09/2016                 CA                       8.9                 08/21/2016                 AST                      14                  08/09/2016              Coags:  Lab Results       Component                Value               Date                        PT                       12.3 (L)            08/20/2016                 PTT                      53 (H)              08/21/2016                 INR                      0.9  08/20/2016              Recent ECHO:  Echo Results     Procedure Component Value Units Date/Time    Echocardiogram Adult Comp W Clr/Dop/Bubble ([id number]) Collected:    08/09/16 0000     Updated:  08/09/16 1250    Narrative:       [facility]  ,    Transthoracic Echocardiogram  2D, M-mode, Doppler, and Color Doppler  Study date:  09-Aug-2016    Patient: LEONARD HENDLER  MR #: [id number]  Account #: [id number]  DOB: 1936-10-08  Age: 52 years  Gender: Female  Height: 65 in  Weight: 144.8 lb  BSA: 1.73 m  BP: 162/ 79    Allergies: TAPE    Sonographer:  Hillery Jacks, RDCS  Cardiologist:  Elspeth Cho, MD    CLINICAL QUESTION: Evaluate for suspected cardiac source of embolism.    HISTORY: PRIOR HISTORY: DM2,HLD,Depression, Arthritis,CAD,CABG    PROCEDURE: The procedure was performed at the bedside. This was a routine  study. The transthoracic approach was used. The study included complete 2D  imaging, M-mode, complete spectral Doppler, and color Doppler. Intravenous  contrast (agitated saline) was administered to evaluate shunting.  Echocardiographic views were limited by restricted patient mobility and   poor  patient compliance. This was a technically difficult study.    MEASUREMENT TABLES    DOPPLER MEASUREMENTS  Aortic valve   (Reference normals)  Peak vel   233 cm/s   (--)  Mean vel   165 cm/s   (--)  VTI   46 cm   (--)  Peak gradient   22 mmHg   (--)  Mean gradient   12 mmHg   (--)  Valve area   1.26 cm   (--)    SYSTEM MEASUREMENT TABLES    2D mode  AoR Diam (2D): 36 mm  Asc Aorta Diam (2D): 32 mm  LA Dimension (2D): 34 mm  LA ESV: 55700 mm3  LA/Ao (2D): 0.9  LASV: 32.2 ml/m2  EF Teichholz (2D mode): 65.7 %  FS (2D-Teich): 35.6 %  IVSd (2D): 13.4 mm  IVSd (2D): 13.4 mm  LVIDd (2D): 39.6 mm  LVIDs (2D):  25.5 mm  LVOT diam (2D mode): 314 mm  LVPWd (2D): 13 mm  RVIDd (2D): 21.9 mm    Apical four chamber  LA ESV Index (A4C): 46.1 ml/m2  LV Area Systolic (A4C): 1750 mm  LV ESV (A4C): 41800 mm3  LV Length Systolic (A4C): 62.7 mm    Apical two chamber  LA ESV Index (A2C): 20.5 ml/m2    Unspecified Scan Mode  AVA (VTI): 124 mm  AVA (Vmax): 126 mm  Mean Gradient: 12 mm(Hg)  Mean Velocity: 1650 mm/s  Vmax: 2330 mm/s  LVOT Diameter: 20 mm  LVOT Peak Gradient: 4 mm(Hg)  LVOT Peak Velocity: 938 mm/s  LVOT VTI: 182 mm  Deceleration Time: 169 ms  MV Peak A Vel: 1010 mm/s  Acceleration Time: 143 ms  PI End Diag Vel: 887 mm/s  Vmax: 751 mm/s  RVSP: 30 mm(Hg)  Regurgitation Vmax: 2430 mm/s    LEFT VENTRICLE: Size was normal. Systolic function was normal. Ejection  fraction was estimated to be 65 %. There were no regional wall motion  abnormalities. Wall thickness was mildly to moderately increased. Doppler:  There was an increased relative contribution of atrial contraction to  ventricular filling. Doppler parameters were consistent with abnormal left  ventricular relaxation (grade 1 diastolic dysfunction).  AORTIC VALVE: The valve was trileaflet. Leaflets exhibited mildly to   moderately  increased thickness and mildly reduced cuspal separation. Doppler: There   was  moderate stenosis.    AORTA: The root exhibited normal size. The ascending aorta was normal in   size.    MITRAL VALVE: Valve structure was normal. There was normal leaflet   separation.  Doppler: The transmitral velocity was within the normal range. There was   no  evidence for stenosis. There was no regurgitation.    LEFT ATRIUM: The atrium was mildly dilated.    ATRIAL SEPTUM: No defect or patent foramen ovale was identified.    RIGHT VENTRICLE: The size was normal. Systolic function was normal.   Doppler:  Systolic pressure was within the normal range. Estimated peak pressure was   30  mmHg.    PULMONIC VALVE: Doppler: There was no  regurgitation.    TRICUSPID VALVE: The valve structure was normal. There was normal leaflet  separation. Doppler: The transtricuspid velocity was within the normal   range.  There was no evidence for tricuspid stenosis. There was mild   regurgitation.    RIGHT ATRIUM: Size was normal. There was a medium-sized, spherical,   echogenic,  fixed mass on the right side of the interatrial septum. It may represent a  myxoma.    SYSTEMIC VEINS: IVC: The inferior vena cava was not well visualized.    PERICARDIUM: There was no pericardial effusion. The pericardium was normal   in  appearance.    SUMMARY:    -  Left ventricle:  -  Size was normal.  -  Systolic function was normal. Ejection fraction was estimated to be 65   %.  -  There were no regional wall motion abnormalities.  -  Wall thickness was mildly to moderately increased.  -  There was an increased relative contribution of atrial contraction to  ventricular filling. -  Doppler parameters were consistent with abnormal   left  ventricular relaxation (grade 1 diastolic dysfunction).    -  Right atrium:  -  Size was normal.  -  There was a medium-sized, spherical, echogenic, fixed mass on the right   side  of the interatrial septum. 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    COMPARISONS:  The previous study was not available for direct comparison.    RECOMMENDATIONS:  If clinically indicated, transesophageal echocardiography could provide  additional information. The attending physician was notified of these   results.    Prepared and signed by    Elspeth Cho, MD  Signed 09-Aug-2016 12:50:23      Echocardiogram Adult Transesophageal W Color Doppler Waveform ([id number])   Collected:  08/11/16 0000     Updated:  08/12/16 1832    Narrative:       [facility]  ,    Transesophageal Echocardiogram  2D, Doppler, and Color Doppler  Study date:  12-Aug-2016    Patient: JISSEL SLAVENS  MR #: [id number]  Account #: [id number]  DOB: 1936-11-01  Age: 72 years  Gender: Female  Height: 65  in  Weight: 145 lb  BSA: 1.73 m  BP: 191/ 84    Allergies: TAPE    Cardiologist:  Bruce Donath, MD  Assisting Technician:  Ali Al-Qaraghuli, RDCS, RVT    CLINICAL QUESTION: CVA. Evaluate RA Myxoma    HISTORY: PRIOR HISTORY: RA Myxoma, CVA, HTN, HLD, DM II, CAD, CABG    PROCEDURE: The procedure was performed in the echo lab. This  was a routine  study. The transesophageal approach was used. The study included complete   2D  imaging, limited spectral Doppler, and color Doppler. Image quality was  adequate.    LEFT VENTRICLE: Size was normal. Systolic function was normal. Ejection  fraction was estimated to be 55 %. There were no regional wall motion  abnormalities. There was hypokinesis of the basal anteroseptal wall(s).   Wall  thickness was normal.    AORTIC VALVE: The valve was trileaflet. Leaflets exhibited moderately   increased  thickness, moderate calcification, and moderately reduced cuspal   separation.  There was no evidence for vegetation. Doppler: Transaortic velocity was   within  the normal range. There was moderate stenosis. There was no regurgitation.    AORTA: The root exhibited normal size. There was no atheroma. There was   mild  atheroma. There was no evidence for dissection. There was no evidence for  aneurysm.    MITRAL VALVE: Valve structure was normal. There was mild thickening. There   was  normal leaflet separation. There was no evidence for vegetation. Doppler:   The  transmitral velocity was within the normal range. There was no evidence   for  stenosis. There was trivial regurgitation.    LEFT ATRIUM: The atrium was dilated. No thrombus was identified.   Appendage: The  appendage was small. No thrombus was identified.    ATRIAL SEPTUM: There was a probable small atrial septal defect. No defect   or  patent foramen ovale was identified. Doppler evaluation was performed.   There  was a right-to-left shunt, in the baseline state.    RIGHT VENTRICLE: The size was normal. Systolic function was  reduced. Wall  thickness was normal.    PULMONIC VALVE: There was no evidence for vegetation. Doppler: The  transpulmonic velocity was within the normal range. There was no   significant  regurgitation.    TRICUSPID VALVE: The valve structure was normal. There was mild   thickening.  There was normal leaflet separation. There was no evidence for vegetation.  Doppler: There was no regurgitation.    RIGHT ATRIUM: The RA cavity is almost entirely occupied by a large (At   least  5.5 x 3.5 cm), echodense, somewhat speckled-appearing echogenic mass. The   point  of attcahment is not well seen but it is most closely associated with the  superior aspects of the RA. It does not directly overlay the tricuspid   annulus,  nor does it prolapse across the tricuspid orifice during diastole. The   visual  appearance is most consistent with a large right atrial myxoma. No   thrombus was  identified. There was a large, solid mass in the atrial cavity. It may  represent a myxoma.    SYSTEMIC VEINS: IVC: The inferior vena cava was normal in size.    PERICARDIUM: There was no pericardial effusion.    SUMMARY:    -  Left ventricle:  -  Size was normal.  -  Systolic function was normal. Ejection fraction was estimated to be 55   %.  -  There were no regional wall motion abnormalities.  -  There was hypokinesis of the basal anteroseptal wall(s).  -  Wall thickness was normal.    -  Aortic valve:  -  The valve was trileaflet. Leaflets exhibited moderately increased   thickness,  moderate calcification, and moderately reduced cuspal separation.  -  Transaortic velocity was within the normal range.  -  There was  moderate stenosis.  -  There was no regurgitation.  -  There was no evidence for vegetation.    -  Aorta, systemic arteries:  -  There was mild atheroma.    -  Mitral valve:  -  There was trivial regurgitation.    -  Left atrial appendage:  -  The appendage was small.  -  No thrombus was identified.    -  Atrial septum:  -  There  was a probable small atrial septal defect.  -  No defect or patent foramen ovale was identified.  -  Doppler evaluation was performed. There was a right-to-left shunt, in   the  baseline state.    -  Right ventricle:  -  Systolic function was reduced.    -  Pulmonic valve:  -  There was no significant regurgitation.    -  Right atrium:  -  The RA cavity is almost entirely occupied by a large (At least 5.5 x   3.5  cm), echodense, somewhat speckled-appearing echogenic mass. The point of  attcahment is not well seen but it is most closely associated with the   superior  aspects of the RA. It does not directly overlay the tricuspid annulus, nor   does  it prolapse across the tricuspid orifice during diastole. The visual   appearance  is most consistent with a large right atrial myxoma.    -  Pericardium:  -  There was no pericardial effusion.    -  Recommendation:  -  Have consulted cardiac surgery as she has had a CVA, has a large RA   mass and  what appears to be a small atrial shunt that could have allowed tumor or  thrombotic material to ebolize fromt he RA into the systemic circulation.  ALthought he echo appearance is suggestive of a myxoma, given her age and   the  massive nature of the mass A CT scan of the chest and abdomen would be  suggested to assess for any systemic malignant process that could have   invaded  the right heart via venous extension.    RECOMMENDATIONS:  Have consulted cardiac surgery as she has had a CVA, has a large RA mass   and  what appears to be a small atrial shunt that could have allowed tumor or  thrombotic material to ebolize fromt he RA into the systemic circulation.  ALthought he echo appearance is suggestive of a myxoma, given her age and   the  massive nature of the mass A CT scan of the chest and abdomen would be  suggested to assess for any systemic malignant process that could have   invaded  the right heart via venous extension.    Prepared and signed by    Bruce Donath,  MD  Signed 12-Aug-2016 18:32:12          Recent Imaging Studies:  Xr Chest 2 Views    Result Date: 08/20/2016    Negative for acute process Gerlene Burdock, MD 08/20/2016 6:20 PM       Medical History:  Past Medical History:  No date: Arthritis  No date: Coronary artery disease  No date: Depression  No date: Difficulty walking      Comment: unsteady gait uses walker/cane/wheelchair  No date: Hyperlipidemia  No date: Hypertension  No date: Kidney disorder  No date: Type 2 diabetes mellitus, controlled  No date: Urinary tract infection      Comment: multiple  time on Nitrofurantion maintaining                dose    Past Surgical History:  2014: AMPUTATION, TOES, MULTIPLE Left  2010: CORONARY ARTERY BYPASS  08/05/2016: CYSTOSCOPY, URETERAL STENT INSERTION      Comment: Procedure: CYSTOSCOPY,RIGHT/RETROGRADE, RIGHT/               URETEROSCOPY, STENT PLACEMENT.;  Surgeon:                Otelia Santee, MD;  Location:  [facility]               OR;  Service: Urology;;                 CYSTOSCOPY,RIGHT/RETROGRADE, RIGHT/                URETEROSCOPY, STENT PLACEMENT.  2015: FOOT AMPUTATION      Comment: x2 three toes  2010: JOINT REPLACEMENT      Comment: left knee      Allergies:   -- Tape -- Itching    Vitals  Temp:  (35.6 C (96 F)-36.4 C (97.5 F)) 36.3 C (97.4 F)  Heart Rate:  (81-91) 81  Resp Rate:  (16-18) 16  BP: (123-162)/(64-79) 140/79    Wt Readings from Last 3 Encounters:  08/08/16 : 65.8 kg (145 lb)  08/05/16 : 65.8 kg (145 lb)    BMI (Estimated body mass index is 24.13 kg/m as calculated from the following:    Height as of this encounter: 1.651 m (5\' 5" ).    Weight as of this encounter: 65.8 kg (145 lb).)  Temp Readings from Last 3 Encounters:  08/21/16 : 36.3 C (97.4 F) (Temporal Artery)  08/05/16 : 36.8 C (98.3 F)    BP Readings from Last 3 Encounters:  08/21/16 : 140/79  08/05/16 : 170/80    Pulse Readings from Last 3 Encounters:  08/21/16 : 81  08/05/16 : 76    _____________________      Signed by:  Fortunato Curling  08/21/16   10:14 AM    =============================================================          Relevant Problems   No active problems are marked relevant to this note.               Anesthesia Plan    ASA 3     general                     intravenous induction   Detailed anesthesia plan: general endotracheal  Monitors/Adjuncts: arterial line, CVP, BIS, PA cath and TEE    Post Op: ICU and post-op ventilation  Post op pain management: per surgeon          pertinent labs reviewed               Signed by: Fortunato Curling 08/21/16 6:28 AM    ===============================================================  Inpatient Anesthesia Evaluation    Patient Name: [facility]  Surgeon: * Surgery not found *  Patient Age / Sex: 80 y.o. / female    Medical History:     Past Medical History:   Diagnosis Date   . Arthritis    . Coronary artery disease    . Depression    . Difficulty walking     unsteady gait uses walker/cane/wheelchair   . Hyperlipidemia    . Hypertension    . Kidney disorder    . Type 2 diabetes  mellitus, controlled    . Urinary tract infection     multiple time on Nitrofurantion maintaining dose       Past Surgical History:   Procedure Laterality Date   . AMPUTATION, TOES, MULTIPLE Left 2014   . CORONARY ARTERY BYPASS  2010   . CYSTOSCOPY, URETERAL STENT INSERTION  08/05/2016    Procedure: CYSTOSCOPY,RIGHT/RETROGRADE, RIGHT/ URETEROSCOPY, STENT PLACEMENT.;  Surgeon: Otelia Santee, MD;  Location: [facility];  Service: Urology;;  CYSTOSCOPY,RIGHT/RETROGRADE, RIGHT/ URETEROSCOPY, STENT PLACEMENT.   Marland Kitchen FOOT AMPUTATION  2015    x2 three toes   . JOINT REPLACEMENT  2010    left knee         Allergies:     Allergies   Allergen Reactions   . Tape Itching         Medications:     No current facility-administered medications for this visit.      Facility-Administered Medications Ordered in Other Visits   Medication Dose Route Frequency Last Rate Last Dose   . 0.9%  NaCl infusion   Intravenous  Continuous   Stopped at 08/21/16 0612   . [MAR Hold] acetaminophen (TYLENOL) tablet 650 mg  650 mg Oral Q4H PRN   650 mg at 08/19/16 2111    Or   . [MAR Hold] acetaminophen (TYLENOL) suppository 650 mg  650 mg Rectal Q4H PRN       . [MAR Hold] acetaminophen (TYLENOL) tablet 650 mg  650 mg Oral Q4H PRN       . [MAR Hold] amLODIPine (NORVASC) tablet 10 mg  10 mg Oral Daily   10 mg at 08/19/16 0931   . [MAR Hold] atorvastatin (LIPITOR) tablet 40 mg  40 mg Oral QHS   40 mg at 08/20/16 2157   . [MAR Hold] chlorhexidine (PERIDEX) 0.12 % solution 15 mL  15 mL Mouth/Throat Pre-Op       . [MAR Hold] dextrose (GLUCOSE) 40 % oral gel 15 g of glucose  15 g of glucose Oral PRN        And   . [MAR Hold] dextrose (D10W) 10% bolus 125 mL  125 mL Intravenous PRN        And   . [MAR Hold] glucagon (rDNA) (GLUCAGEN) injection 1 mg  1 mg Intramuscular PRN       . [MAR Hold] furosemide (LASIX) injection 40 mg  0.608 mg/kg Intravenous ONCE PRN       . heparin 25,000 units in dextrose 5% 500 mL infusion (premix)  12 Units/kg/hr Intravenous Continuous 17.1 mL/hr at 08/20/16 2309 13 Units/kg/hr at 08/20/16 2309   . [MAR Hold] hydrALAZINE (APRESOLINE) injection 10 mg  10 mg Intravenous Q6H PRN       . [MAR Hold] insulin glargine (LANTUS) injection 8 Units  8 Units Subcutaneous QHS   8 Units at 08/20/16 2157   . [MAR Hold] insulin lispro (HumaLOG) injection 1-6 Units  1-6 Units Subcutaneous QHS PRN   5 Units at 08/20/16 2157   . [MAR Hold] insulin lispro (HumaLOG) injection 2-10 Units  2-10 Units Subcutaneous TID AC PRN   2 Units at 08/20/16 1820   . [MAR Hold] LORazepam (ATIVAN) injection 1 mg  1 mg Intravenous Q8H PRN   1 mg at 08/09/16 1939   . [MAR Hold] metoprolol XL (TOPROL-XL) 24 hr tablet 100 mg  100 mg Oral Daily   100 mg at 08/19/16 0931   . [MAR Hold] mupirocin (BACTROBAN NASAL) 2 % ointment  Nasal BID   0.9 each at 08/20/16 2157   . [MAR Hold] ondansetron (ZOFRAN-ODT) disintegrating tablet 4 mg  4 mg Oral Q6H PRN        Or   .  [MAR Hold] ondansetron (ZOFRAN) injection 4 mg  4 mg Intravenous Q6H PRN       . [MAR Hold] traMADol (ULTRAM) tablet 50 mg  50 mg Oral Q6H PRN   50 mg at 08/14/16 2113              Prior to Admission medications    Medication Sig Start Date End Date Taking? Authorizing Provider   aspirin EC 81 MG EC tablet Take 81 mg by mouth daily.    [provider]   atorvastatin (LIPITOR) 40 MG tablet Take 40 mg by mouth daily.    [provider]   DULoxetine (CYMBALTA) 60 MG capsule Take 60 mg by mouth daily.    [provider]   ferrous sulfate 325 (65 FE) MG tablet Take 325 mg by mouth every morning with breakfast.    [provider]   Insulin Glargine (LANTUS SC) Inject 10 Unit into the skin nightly.Took 5 units 08/04/16        [provider]   liraglutide (VICTOZA) 18 MG/3ML Solution Pen-injector Inject 1.8 each into the skin daily.        [provider]   metoprolol XL (TOPROL-XL) 25 MG 24 hr tablet Take 25 mg by mouth daily.    [provider]   nitrofurantoin (MACRODANTIN) 50 MG capsule Take 2 capsules (100 mg total) by mouth every 12 (twelve) hours. 08/05/16   Tescher, Hoyle Sauer, MD   Probiotic Product (PROBIOTIC ADVANCED PO) Take by mouth.    [provider]     Vitals   Temp:  [35.6 C (96 F)-36.4 C (97.5 F)] 36.3 C (97.4 F)  Heart Rate:  [68-91] 81  Resp Rate:  [16-18] 16  BP: (120-162)/(61-79) 140/79    Wt Readings from Last 3 Encounters:   08/08/16 65.8 kg (145 lb)   08/05/16 65.8 kg (145 lb)     BMI (Estimated body mass index is 24.13 kg/m as calculated from the following:    Height as of 08/08/16: 1.651 m (5\' 5" ).    Weight as of 08/08/16: 65.8 kg (145 lb).)  Temp Readings from Last 3 Encounters:   08/21/16 36.3 C (97.4 F) (Temporal Artery)   08/05/16 36.8 C (98.3 F)     BP Readings from Last 3 Encounters:   08/21/16 140/79   08/05/16 170/80     Pulse Readings from Last 3 Encounters:   08/21/16 81   08/05/16 76           Labs:    CBC:  Lab Results   Component Value Date    WBC 8.77 08/21/2016    HGB 10.4 (L) 08/21/2016    HCT 33.5 (L) 08/21/2016    PLT 351 08/21/2016       Chemistries:  Lab Results   Component Value Date    NA 141 08/21/2016    K 5.2 (H) 08/21/2016    CL 112 (H) 08/21/2016    CO2 20 (L) 08/21/2016    BUN 29.0 (H) 08/21/2016    CREAT 1.6 (H) 08/21/2016    GLU 145 (H) 08/21/2016    HGBA1C 7.7 (H) 08/09/2016    CA 8.9 08/21/2016    AST 14 08/09/2016       Coags:  Lab Results   Component Value Date    PT 12.3 (L) 08/20/2016    PTT 53 (H) 08/21/2016    INR 0.9 08/20/2016     _____________________      Signed by: Fortunato Curling  08/21/16   6:28 AM    =============================================================      ===============================================================  Inpatient Anesthesia Evaluation    Patient Name: [facility] A  Surgeon: * Surgery not found *  Patient Age / Sex: 80 y.o. / female    Medical History:     Past Medical History:   Diagnosis Date   . Arthritis    . Coronary artery disease    . Depression    . Difficulty walking     unsteady gait uses walker/cane/wheelchair   . Hyperlipidemia    . Hypertension    . Kidney disorder    . Type 2 diabetes mellitus, controlled    . Urinary tract infection     multiple time on Nitrofurantion maintaining dose       Past Surgical History:   Procedure Laterality Date   . AMPUTATION, TOES, MULTIPLE Left 2014   . CORONARY ARTERY BYPASS  2010   . CYSTOSCOPY, URETERAL STENT INSERTION  08/05/2016    Procedure: CYSTOSCOPY,RIGHT/RETROGRADE, RIGHT/ URETEROSCOPY, STENT PLACEMENT.;  Surgeon: Otelia Santee, MD;  Location: [facility];  Service: Urology;;  CYSTOSCOPY,RIGHT/RETROGRADE, RIGHT/ URETEROSCOPY, STENT PLACEMENT.   Marland Kitchen FOOT AMPUTATION  2015    x2 three toes   . JOINT REPLACEMENT  2010    left knee         Allergies:     Allergies   Allergen Reactions   . Tape Itching         Medications:     No current facility-administered medications for this visit.       Facility-Administered Medications Ordered in Other Visits   Medication Dose Route Frequency Last Rate Last Dose   . 0.9%  NaCl infusion   Intravenous Continuous   Stopped at 08/21/16 0612   . [MAR Hold] acetaminophen (TYLENOL) tablet 650 mg  650 mg Oral Q4H PRN   650 mg at 08/19/16 2111    Or   . [MAR Hold] acetaminophen (TYLENOL) suppository 650 mg  650 mg Rectal Q4H PRN       . [MAR Hold] acetaminophen (TYLENOL) tablet 650 mg  650 mg Oral Q4H PRN       . [MAR Hold] amLODIPine (NORVASC) tablet 10 mg  10 mg Oral Daily   10 mg at 08/19/16 0931   . [MAR Hold] atorvastatin (LIPITOR) tablet 40 mg  40 mg Oral QHS   40 mg at 08/20/16 2157   . [MAR Hold] chlorhexidine (PERIDEX) 0.12 % solution 15 mL  15 mL Mouth/Throat Pre-Op       . [MAR Hold] dextrose (GLUCOSE) 40 % oral gel 15 g of glucose  15 g of glucose Oral PRN        And   . [MAR Hold] dextrose (D10W) 10% bolus 125 mL  125 mL Intravenous PRN        And   . [MAR Hold] glucagon (rDNA) (GLUCAGEN) injection 1 mg  1 mg Intramuscular PRN       . [MAR Hold] furosemide (LASIX) injection 40 mg  0.608 mg/kg Intravenous ONCE PRN       . heparin 25,000 units in dextrose 5% 500 mL infusion (premix)  12 Units/kg/hr Intravenous Continuous 17.1 mL/hr at 08/20/16 2309 13 Units/kg/hr at 08/20/16  2309   Mitzi Hansen Hold] hydrALAZINE (APRESOLINE) injection 10 mg  10 mg Intravenous Q6H PRN       . [MAR Hold] insulin glargine (LANTUS) injection 8 Units  8 Units Subcutaneous QHS   8 Units at 08/20/16 2157   . [MAR Hold] insulin lispro (HumaLOG) injection 1-6 Units  1-6 Units Subcutaneous QHS PRN   5 Units at 08/20/16 2157   . [MAR Hold] insulin lispro (HumaLOG) injection 2-10 Units  2-10 Units Subcutaneous TID AC PRN   2 Units at 08/20/16 1820   . [MAR Hold] LORazepam (ATIVAN) injection 1 mg  1 mg Intravenous Q8H PRN   1 mg at 08/09/16 1939   . [MAR Hold] metoprolol XL (TOPROL-XL) 24 hr tablet 100 mg  100 mg Oral Daily   100 mg at 08/19/16 0931   . [MAR Hold] mupirocin (BACTROBAN NASAL) 2  % ointment   Nasal BID   0.9 each at 08/20/16 2157   . [MAR Hold] ondansetron (ZOFRAN-ODT) disintegrating tablet 4 mg  4 mg Oral Q6H PRN        Or   . [MAR Hold] ondansetron (ZOFRAN) injection 4 mg  4 mg Intravenous Q6H PRN       . [MAR Hold] traMADol (ULTRAM) tablet 50 mg  50 mg Oral Q6H PRN   50 mg at 08/14/16 2113              Prior to Admission medications    Medication Sig Start Date End Date Taking? Authorizing Provider   aspirin EC 81 MG EC tablet Take 81 mg by mouth daily.    [provider]   atorvastatin (LIPITOR) 40 MG tablet Take 40 mg by mouth daily.    [provider]   DULoxetine (CYMBALTA) 60 MG capsule Take 60 mg by mouth daily.    [provider]   ferrous sulfate 325 (65 FE) MG tablet Take 325 mg by mouth every morning with breakfast.    [provider]   Insulin Glargine (LANTUS SC) Inject 10 Unit into the skin nightly.Took 5 units 08/04/16        [provider]   liraglutide (VICTOZA) 18 MG/3ML Solution Pen-injector Inject 1.8 each into the skin daily.        [provider]   metoprolol XL (TOPROL-XL) 25 MG 24 hr tablet Take 25 mg by mouth daily.    [provider]   nitrofurantoin (MACRODANTIN) 50 MG capsule Take 2 capsules (100 mg total) by mouth every 12 (twelve) hours. 08/05/16   Tescher, Hoyle Sauer, MD   Probiotic Product (PROBIOTIC ADVANCED PO) Take by mouth.    [provider]     Vitals   Temp:  [35.6 C (96 F)-36.4 C (97.5 F)] 36.3 C (97.4 F)  Heart Rate:  [68-91] 81  Resp Rate:  [16-18] 16  BP: (120-162)/(61-79) 140/79    Wt Readings from Last 3 Encounters:   08/08/16 65.8 kg (145 lb)   08/05/16 65.8 kg (145 lb)     BMI (Estimated body mass index is 24.13 kg/m as calculated from the following:    Height as of 08/08/16: 1.651 m (5\' 5" ).    Weight as of 08/08/16: 65.8 kg (145 lb).)  Temp Readings from Last 3 Encounters:   08/21/16 36.3 C (97.4 F) (Temporal Artery)   08/05/16 36.8 C (98.3 F)     BP Readings  from Last 3 Encounters:   08/21/16 140/79   08/05/16 170/80  Pulse Readings from Last 3 Encounters:   08/21/16 81   08/05/16 76           Labs:   CBC:  Lab Results   Component Value Date    WBC 8.77 08/21/2016    HGB 10.4 (L) 08/21/2016    HCT 33.5 (L) 08/21/2016    PLT 351 08/21/2016       Chemistries:  Lab Results   Component Value Date    NA 141 08/21/2016    K 5.2 (H) 08/21/2016    CL 112 (H) 08/21/2016    CO2 20 (L) 08/21/2016    BUN 29.0 (H) 08/21/2016    CREAT 1.6 (H) 08/21/2016    GLU 145 (H) 08/21/2016    HGBA1C 7.7 (H) 08/09/2016    CA 8.9 08/21/2016    AST 14 08/09/2016       Coags:  Lab Results   Component Value Date    PT 12.3 (L) 08/20/2016    PTT 53 (H) 08/21/2016    INR 0.9 08/20/2016     _____________________      Signed by: Fortunato Curling  08/21/16   6:28 AM    =============================================================  [facility]  ,    Transthoracic Echocardiogram  2D, M-mode, Doppler, and Color Doppler  Study date:  09-Aug-2016    Patient: JULIANNAH OHMANN  MR #: [id number]  Account #: [id number]  DOB: 1936/11/15  Age: 54 years  Gender: Female  Height: 65 in  Weight: 144.8 lb  BSA: 1.73 m  BP: 162/ 79    Allergies: TAPE    Sonographer:  Hillery Jacks, RDCS  Cardiologist:  Elspeth Cho, MD    CLINICAL QUESTION: Evaluate for suspected cardiac source of embolism.    HISTORY: PRIOR HISTORY: DM2,HLD,Depression, Arthritis,CAD,CABG    PROCEDURE: The procedure was performed at the bedside. This was a routine  study. The transthoracic approach was used. The study included complete 2D  imaging, M-mode, complete spectral Doppler, and color Doppler. Intravenous  contrast (agitated saline) was administered to evaluate shunting.  Echocardiographic views were limited by restricted patient mobility and poor  patient compliance. This was a technically difficult study.    MEASUREMENT TABLES    DOPPLER MEASUREMENTS  Aortic valve   (Reference normals)  Peak vel   233 cm/s   (--)  Mean vel   165  cm/s   (--)  VTI   46 cm   (--)  Peak gradient   22 mmHg   (--)  Mean gradient   12 mmHg   (--)  Valve area   1.26 cm   (--)    SYSTEM MEASUREMENT TABLES    2D mode  AoR Diam (2D): 36 mm  Asc Aorta Diam (2D): 32 mm  LA Dimension (2D): 34 mm  LA ESV: 55700 mm3  LA/Ao (2D): 0.9  LASV: 32.2 ml/m2  EF Teichholz (2D mode): 65.7 %  FS (2D-Teich): 35.6 %  IVSd (2D): 13.4 mm  IVSd (2D): 13.4 mm  LVIDd (2D): 39.6 mm  LVIDs (2D): 25.5 mm  LVOT diam (2D mode): 314 mm  LVPWd (2D): 13 mm  RVIDd (2D): 21.9 mm    Apical four chamber  LA ESV Index (A4C): 46.1 ml/m2  LV Area Systolic (A4C): 1750 mm  LV ESV (A4C): 41800 mm3  LV Length Systolic (A4C): 62.7 mm    Apical two chamber  LA ESV Index (A2C): 20.5 ml/m2    Unspecified Scan Mode  AVA (VTI): 124 mm  AVA (  Vmax): 126 mm  Mean Gradient: 12 mm[Hg]  Mean Velocity: 1650 mm/s  Vmax: 2330 mm/s  LVOT Diameter: 20 mm  LVOT Peak Gradient: 4 mm[Hg]  LVOT Peak Velocity: 938 mm/s  LVOT VTI: 182 mm  Deceleration Time: 169 ms  MV Peak A Vel: 1010 mm/s  Acceleration Time: 143 ms  PI End Diag Vel: 887 mm/s  Vmax: 751 mm/s  RVSP: 30 mm[Hg]  Regurgitation Vmax: 2430 mm/s    LEFT VENTRICLE: Size was normal. Systolic function was normal. Ejection  fraction was estimated to be 65 %. There were no regional wall motion  abnormalities. Wall thickness was mildly to moderately increased. Doppler:  There was an increased relative contribution of atrial contraction to  ventricular filling. Doppler parameters were consistent with abnormal left  ventricular relaxation (grade 1 diastolic dysfunction).    AORTIC VALVE: The valve was trileaflet. Leaflets exhibited mildly to moderately  increased thickness and mildly reduced cuspal separation. Doppler: There was  moderate stenosis.    AORTA: The root exhibited normal size. The ascending aorta was normal in size.    MITRAL VALVE: Valve structure was normal. There was normal leaflet separation.  Doppler: The transmitral velocity was within  the normal range. There was no  evidence for stenosis. There was no regurgitation.    LEFT ATRIUM: The atrium was mildly dilated.    ATRIAL SEPTUM: No defect or patent foramen ovale was identified.    RIGHT VENTRICLE: The size was normal. Systolic function was normal. Doppler:  Systolic pressure was within the normal range. Estimated peak pressure was 30  mmHg.    PULMONIC VALVE: Doppler: There was no regurgitation.    TRICUSPID VALVE: The valve structure was normal. There was normal leaflet  separation. Doppler: The transtricuspid velocity was within the normal range.  There was no evidence for tricuspid stenosis. There was mild regurgitation.    RIGHT ATRIUM: Size was normal. There was a medium-sized, spherical, echogenic,  fixed mass on the right side of the interatrial septum. It may represent a  myxoma.    SYSTEMIC VEINS: IVC: The inferior vena cava was not well visualized.    PERICARDIUM: There was no pericardial effusion. The pericardium was normal in  appearance.    SUMMARY:    -  Left ventricle:  -  Size was normal.  -  Systolic function was normal. Ejection fraction was estimated to be 65 %.  -  There were no regional wall motion abnormalities.  -  Wall thickness was mildly to moderately increased.  -  There was an increased relative contribution of atrial contraction to  ventricular filling. -  Doppler parameters were consistent with abnormal left  ventricular relaxation (grade 1 diastolic dysfunction).    -  Right atrium:  -  Size was normal.  -  There was a medium-sized, spherical, echogenic, fixed mass on the right side  of the interatrial septum. 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    COMPARISONS:  The previous study was not available for direct comparison.    RECOMMENDATIONS:  If clinically indicated, transesophageal echocardiography could provide  additional information. The attending physician was notified of these results.    Prepared and signed by    Elspeth Cho, MD  Signed 09-Aug-2016  12:50:23

## 2016-08-21 NOTE — OR Nursing (Signed)
0630 - Patient and family seen in pre-op holding area.  Patient verifies, name, date of birth, and surgical procedure being performed today.  H&P has been reviewed and all consents have been signed and are present in patient's chart.  Family acknowledges teachings with no further questions at this time.

## 2016-08-21 NOTE — Brief Op Note (Signed)
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]     * Patent foramen ovale [Q21.1]    Postoperative Diagnosis:   Post-Op Diagnosis Codes:     * 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]     * Patent foramen ovale [Q21.1]    Anesthesia:   General    Estimated Blood Loss:    * No values recorded between 08/21/2016  6:00 AM and 08/21/2016 12:47 PM *    Implants:   * No implants in log *    Drains:   24 Fr blake drains x2    Specimens:        SPECIMENS (last 24 hours)      Pathology Specimens     Row Name 08/21/16 1100                Additional Information    Send final report to: Clarene Duke          Specimen Information    Specimen Testing Required Frozen Section       Specimen ID  [id number]       Specimen Description Cardiac Mass           Findings:   Large lipomatous lesion in the interatrial septum, no evidence of myxoma.  Frozen section consistent with lipoma    Complications:   none    Signed by: Christene Lye, MD                                                                           [facility] HEART OR

## 2016-08-21 NOTE — Transfer of Care (Signed)
Anesthesia Transfer of Care Note    Patient: Glenda Green    Procedures performed: Procedure(s) with comments:  Closure, Patent Foramen Ovalis  Via Thoracotomy  Excision, Mass - excision of cardiac mass  TEE IN CVOR    Anesthesia type: General ETT    Patient location:ICU    Last vitals:   Vitals:    08/21/16 1402   BP: 118/57   Pulse: 80   Resp: (!) 10   Temp:    SpO2: 100%   vss. Full update given to CVICU team.    Post pain: Patient not complaining of pain, continue current therapy      Mental Status:sedated and unresponsive    Respiratory Function: intubated    Cardiovascular: stable    Nausea/Vomiting: patient not complaining of nausea or vomiting    Hydration Status: adequate    Post assessment: no apparent anesthetic complications, no reportable events and will follow up    Signed by: Hetty Ely  08/21/16 2:02 PM

## 2016-08-21 NOTE — OT Progress Note (Signed)
Occupational Therapy Cancellation Note    Patient: Glenda Green  UEA:[id number]    Unit: FI203/FI203-01    Patient not seen for occupational therapy secondary to pt went OR this AM transferred to heart center will new order.     Ander Purpura, MA/OTR, CSRS, LSVT-BIG  [phone]

## 2016-08-21 NOTE — Progress Notes (Signed)
[facility], [employer].  Progress Note    Assessment:  1.  Acute on CRF; multifactorial etiology; fluctuating serum creatinine  2. S/p excison of right atrial interseptum lipoma; s/p PFO closure  3. S/p CVA; r/o paradoxical embolism  4  Partial right sided renal obstruction  5. MMPs    Plan:  1.  Continue current medication regimen; IVFs NS @ 85cc/hr  2.  Suspect can remove Swan by tomorrow  3,  qam labs    Further recommendations to follow    Thank you    Louellen Molder, MD  Office - [phone]  [employer] - [phone]  ++++++++++++++++++++++++++++++++++++++++++++++++++++++++++++++  Subjective:  No new complaints    Medications:  Scheduled Meds:  Current Facility-Administered Medications   Medication Dose Route Frequency   . amiodarone  200 mg Oral Q12H SCH   . amLODIPine  10 mg Oral Daily   . [START ON 08/22/2016] aspirin EC  325 mg Oral Daily   . atorvastatin  40 mg Oral QHS   . cefuroxime  1.5 g Intravenous Q8H   . chlorhexidine  15 mL Mouth/Throat Q12H SCH   . famotidine  20 mg Oral Daily   . insulin glargine  8 Units Subcutaneous QHS   . mupirocin   Nasal BID   . mupirocin   Nasal Q12H SCH   . propranolol  10 mg Oral 4 times per day   . senna-docusate  1 tablet Oral QHS     Continuous Infusions:  . sodium chloride Stopped (08/21/16 0612)   . sodium chloride 85 mL/hr at 08/21/16 1600   . heparin 25,000 units in dextrose 5% 500 mL 13 Units/kg/hr (08/20/16 2309)   . insulin (regular) infusion 4.5 Units/hr (08/21/16 1600)   . niCARdipine 5 mg/hr (08/21/16 1642)   . nitroglycerin 66.667 mcg/min (08/21/16 1600)   . norepinephrine (LEVOPHED) infusion Stopped (08/21/16 1410)   . propofol Stopped (08/21/16 1625)   . ropivacaine 10 mL/hr at 08/21/16 1606     PRN Meds:acetaminophen **OR** acetaminophen, acetaminophen, acetaminophen, bisacodyl, Nursing communication: Adult Hypoglycemia Treatment Algorithm **AND** dextrose **AND** dextrose **AND** glucagon (rDNA) **AND** Nursing communication: Document  Significant Event Note, dextrose, dextrose, furosemide, hydrALAZINE, hydrALAZINE, HYDROmorphone, insulin lispro, insulin lispro, lactated ringers, LORazepam, niCARdipine, nitroglycerin, norepinephrine (LEVOPHED) infusion, ondansetron **OR** ondansetron, oxyCODONE-acetaminophen, traMADol, traMADol    Objective:  Vital signs in last 24 hours:  Temp:  [96 F (35.6 C)-97.4 F (36.3 C)] 97.4 F (36.3 C)  Heart Rate:  [79-91] 79  Resp Rate:  [10-22] 21  BP: (105-162)/(55-79) 113/55  Arterial Line BP: (101-150)/(46-73) 120/55  FiO2:  [40 %-100 %] 40 %  Intake/Output last 24 hours:    Intake/Output Summary (Last 24 hours) at 08/21/16 1732  Last data filed at 08/21/16 1600   Gross per 24 hour   Intake          3431.23 ml   Output             3645 ml   Net          -213.77 ml     Intake/Output this shift:  I/O this shift:  In: 3431.23 [I.V.:2201.23; Blood:380; IV Piggyback:850]  Out: 845 [Urine:755; Chest Tube:90]    Physical Exam:   Gen: Seen post-op; just extubated   CV: S1 S2 N RRR   Chest: No rales/no rhonchi   Ab: ND NT soft no HSM +BS   Ext: No edema    Labs:    Recent Labs  Lab  08/21/16  1409 08/21/16  0451 08/20/16  0604 08/19/16  0405   Glucose 161* 145* 168* 122*   BUN 24.0* 29.0* 31.0* 31.0*   Creatinine 1.3* 1.6* 1.8* 1.7*   Calcium 8.5 8.9 8.9 8.5   Sodium 145 141 139 139   Potassium 3.8 5.2* 5.0 4.1   Chloride 109 112* 110 109   CO2 21* 20* 23 22   Phosphorus  --  4.7 4.8* 4.9*   Magnesium 3.3* 1.8 2.1 1.6       Recent Labs  Lab 08/21/16  1409 08/21/16  0449 08/20/16  0604   WBC 15.69* 8.77 8.09   Hgb 10.6* 10.4* 10.1*   Hematocrit 31.8* 33.5* 31.9*   MCV 88.1 89.1 88.1   MCH 29.4 27.7* 27.9*   MCHC 33.3 31.0* 31.7*   RDW 15 16* 15   MPV 10.2 10.4 10.3   Platelets 184 351 360

## 2016-08-21 NOTE — Progress Notes (Signed)
Time out pre-procedure pause performed with Nino Glow, RN.  Physician/APP Chauncy Lean gave go ahead.  All necessary equipment was available.  Patient extubated to 6L nasal cannula.

## 2016-08-21 NOTE — Plan of Care (Signed)
Problem: Post-op Phase - Cardiac Surgery  Goal: Effective breathing pattern is maintained   08/21/16 1850   Goal/Interventions addressed this shift   Effective breathing pattern is maintained  Position patient for maximum ventilatory efficiency with HOB to a minimum of 30 degrees when hemodynamically stable;Maintain SpO2 level per LIP order;Reinforce use of ordered respiratory interventions (i.e. CPAP, BiPAP, Incentive Spirometer, Acapella);Monitor for sleep apnea;Monitor end-tidal CO2 level per LIP order;Monitor for medication-induced respiratory depression   Pt extubated at 1728, ABG WDLs, pt O2sats >96% on 6L NC, pt reports pain on right side, pt has nerve block and was given 1 dose of dilaudid. Will cont to monitor and follow the plan of care

## 2016-08-21 NOTE — Progress Notes (Signed)
08/21/16 0516   Spirometry Pre and/or Post   Procedure Location and Type Bedside- Pre Only   Pre FVC 1.87 L   Pre FEV1 1.74 L   Pre FEV1/FVC % (Calculated) 93   Patient follows commands Yes   Cough during exhalation No   Early termination No   Leak No   Variable effort Yes   2 FVC 150 ml of each other Yes   2 FEV1 150 ml of each other Yes   3 maneuvers performed Yes   Patient Effort Fair   Adverse Reactions None   Primary Charges   $ Spirometry Type Performed Simple - CPT 94010     Patient gave a fair effort. A preliminary copy has been faxed to medical records and a copy has been placed in the bedside chart.

## 2016-08-22 ENCOUNTER — Inpatient Hospital Stay: Payer: Medicare Other

## 2016-08-22 LAB — HEPATIC FUNCTION PANEL
ALT: 32 U/L (ref 0–55)
AST (SGOT): 46 U/L — ABNORMAL HIGH (ref 5–34)
Albumin/Globulin Ratio: 1.9 (ref 0.9–2.2)
Albumin: 3.8 g/dL (ref 3.5–5.0)
Alkaline Phosphatase: 60 U/L (ref 37–106)
Bilirubin Direct: 0.3 mg/dL (ref 0.0–0.5)
Bilirubin Indirect: 0.2 mg/dL (ref 0.0–1.1)
Bilirubin, Total: 0.5 mg/dL (ref 0.2–1.2)
Globulin: 2 g/dL (ref 2.0–3.6)
Protein, Total: 5.8 g/dL — ABNORMAL LOW (ref 6.0–8.3)

## 2016-08-22 LAB — CBC
Absolute NRBC: 0 10*3/uL
Hematocrit: 33.5 % — ABNORMAL LOW (ref 37.0–47.0)
Hgb: 11 g/dL — ABNORMAL LOW (ref 12.0–16.0)
MCH: 28.6 pg (ref 28.0–32.0)
MCHC: 32.8 g/dL (ref 32.0–36.0)
MCV: 87.2 fL (ref 80.0–100.0)
MPV: 10.3 fL (ref 9.4–12.3)
Nucleated RBC: 0 /100 WBC (ref 0.0–1.0)
Platelets: 210 10*3/uL (ref 140–400)
RBC: 3.84 10*6/uL — ABNORMAL LOW (ref 4.20–5.40)
RDW: 16 % — ABNORMAL HIGH (ref 12–15)
WBC: 20.88 10*3/uL — ABNORMAL HIGH (ref 3.50–10.80)

## 2016-08-22 LAB — GLUCOSE WHOLE BLOOD - POCT
Whole Blood Glucose POCT: 145 mg/dL — ABNORMAL HIGH (ref 70–100)
Whole Blood Glucose POCT: 143 mg/dL — ABNORMAL HIGH (ref 70–100)
Whole Blood Glucose POCT: 137 mg/dL — ABNORMAL HIGH (ref 70–100)
Whole Blood Glucose POCT: 133 mg/dL — ABNORMAL HIGH (ref 70–100)
Whole Blood Glucose POCT: 135 mg/dL — ABNORMAL HIGH (ref 70–100)
Whole Blood Glucose POCT: 152 mg/dL — ABNORMAL HIGH (ref 70–100)
Whole Blood Glucose POCT: 164 mg/dL — ABNORMAL HIGH (ref 70–100)
Whole Blood Glucose POCT: 143 mg/dL — ABNORMAL HIGH (ref 70–100)
Whole Blood Glucose POCT: 111 mg/dL — ABNORMAL HIGH (ref 70–100)
Whole Blood Glucose POCT: 180 mg/dL — ABNORMAL HIGH (ref 70–100)
Whole Blood Glucose POCT: 174 mg/dL — ABNORMAL HIGH (ref 70–100)
Whole Blood Glucose POCT: 155 mg/dL — ABNORMAL HIGH (ref 70–100)

## 2016-08-22 LAB — BASIC METABOLIC PANEL
BUN: 25 mg/dL — ABNORMAL HIGH (ref 7.0–19.0)
CO2: 24 mEq/L (ref 22–29)
Calcium: 8.8 mg/dL (ref 7.9–10.2)
Chloride: 111 mEq/L (ref 100–111)
Creatinine: 1.4 mg/dL — ABNORMAL HIGH (ref 0.6–1.0)
Glucose: 153 mg/dL — ABNORMAL HIGH (ref 70–100)
Potassium: 5.2 mEq/L — ABNORMAL HIGH (ref 3.5–5.1)
Sodium: 141 mEq/L (ref 136–145)

## 2016-08-22 LAB — BLOOD GAS, ARTERIAL
Arterial Total CO2: 21.2 mEq/L — ABNORMAL LOW (ref 24.0–30.0)
Base Excess, Arterial: -3.8 mEq/L — ABNORMAL LOW (ref <=2.0)
HCO3, Arterial: 20.1 mEq/L — ABNORMAL LOW (ref 23.0–29.0)
O2 Sat, Arterial: 97.7 % (ref 95.0–100.0)
Temperature: 37
pCO2, Arterial: 34.3 mmhg — ABNORMAL LOW (ref 35.0–45.0)
pH, Arterial: 7.385 (ref 7.350–7.450)
pO2, Arterial: 87.7 mmhg (ref 80.0–90.0)

## 2016-08-22 LAB — MAGNESIUM: Magnesium: 2.6 mg/dL (ref 1.6–2.6)

## 2016-08-22 LAB — PHOSPHORUS: Phosphorus: 4.2 mg/dL (ref 2.3–4.7)

## 2016-08-22 LAB — GFR: EGFR: 36.3

## 2016-08-22 MED ORDER — SENNOSIDES-DOCUSATE SODIUM 8.6-50 MG PO TABS
2.0000 | ORAL_TABLET | Freq: Two times a day (BID) | ORAL | Status: DC
Start: 2016-08-22 — End: 2016-08-25
  Administered 2016-08-22 – 2016-08-23 (×2): 2 via ORAL
  Filled 2016-08-22: qty 2
  Filled 2016-08-22 (×2): qty 1

## 2016-08-22 MED ORDER — ATORVASTATIN CALCIUM 10 MG PO TABS
20.0000 mg | ORAL_TABLET | Freq: Every evening | ORAL | Status: DC
Start: 2016-08-22 — End: 2016-08-25
  Administered 2016-08-22 – 2016-08-24 (×3): 20 mg via ORAL
  Filled 2016-08-22 (×3): qty 2

## 2016-08-22 MED ORDER — METOPROLOL TARTRATE 25 MG PO TABS
25.0000 mg | ORAL_TABLET | Freq: Two times a day (BID) | ORAL | Status: DC
Start: 2016-08-22 — End: 2016-08-22

## 2016-08-22 MED ORDER — FUROSEMIDE 10 MG/ML IJ SOLN
20.0000 mg | Freq: Every day | INTRAMUSCULAR | Status: DC
Start: 2016-08-22 — End: 2016-08-24
  Administered 2016-08-22 – 2016-08-23 (×2): 20 mg via INTRAVENOUS
  Filled 2016-08-22 (×2): qty 4

## 2016-08-22 MED ORDER — FENTANYL CITRATE (PF) 50 MCG/ML IJ SOLN (WRAP)
25.0000 ug | INTRAMUSCULAR | Status: DC | PRN
Start: 2016-08-22 — End: 2016-08-23
  Administered 2016-08-22: 25 ug via INTRAVENOUS
  Filled 2016-08-22: qty 2

## 2016-08-22 MED ORDER — METOPROLOL TARTRATE 25 MG PO TABS
25.0000 mg | ORAL_TABLET | Freq: Two times a day (BID) | ORAL | Status: DC
Start: 2016-08-22 — End: 2016-08-25
  Administered 2016-08-22 – 2016-08-25 (×7): 25 mg via ORAL
  Filled 2016-08-22 (×7): qty 1

## 2016-08-22 NOTE — Anesthesia Postprocedure Evaluation (Signed)
Anesthesia Post Evaluation    Patient: Glenda [name]    Procedure(s) with comments:  Closure, Patent Foramen Ovalis  Via Thoracotomy  Excision, Mass - excision of cardiac mass  TEE IN CVOR    Anesthesia type: general    Last Vitals:   Vitals:    08/22/16 1200   BP:    Pulse: 94   Resp: (!) 25   Temp: 36.7 C (98.1 F)   SpO2: 98%       Patient Location: ICU      Post Pain: Patient not complaining of pain, continue current therapy    Mental Status: awake and alert    Respiratory Function: tolerating face mask    Cardiovascular: stable    Nausea/Vomiting: patient not complaining of nausea or vomiting    Hydration Status: adequate    Post Assessment: no apparent anesthetic complications, no reportable events and no evidence of recall    QCDR IS INCOMPLETE      Anesthesia Qualified Clinical Data Registry    Central Line      CVC insertion : YES              > Maximal sterile barrier technique (cap/mask/sterile gown/sterile gloves/sterile sheet/hand hygiene/2% chlorhedinine or acceptable antiseptic alternative) : YES              > Pneumothorax requiring thoracostomy secondary to CVC : NO                > Arterial injury secondary to CVC placement : NO    Perioperative temperature management      General/neuraxial anesthesia > or = 60 minutes (excluding CABG) : YES              > Use of intraoperative active warming : YES              > Temperature > or = 36 degrees Centigrade (96.8 degrees Farenheit) during time span from 30 minutes before up to 15 minutes after anesthesia end time : YES      Administration of antibiotic prophylaxis      Age > or = 18, with IV access, with surgical procedure for which antibiotic prophylaxis indicated, and not on chronic antibiotics : YES              > Prophylactic antibiotics within 1 hour of incision (or fluroroquinolone/vancomycin within 2 hours of incision) : YES    Medication Administration      Ordering or administration of drug inconsistent with intended drug, dose, delivery  or timing : NO      Dental loss/damage      Dental injury with administration of anesthesia : NO      Difficult intubation due to unrecognized difficult airway        Elective airway procedure including but not limited to: tracheostomy, fiberoptic bronchoscopy, rigid bronchoscopy; jet ventilation; or elective use of a device to facilitate airway management such as a Glidescope : NO                > Unanticipated difficult intubation post pre-evaluation : NO      Aspiration of gastric contents        Aspiration of gastric contents : NO                    Surgical fire        Procedure requiring electrocautery/laser : YES                >  Ignition/burning in invasive procedure location : NO      Immediate perioperative cardiac arrest        Cardiac arrest in OR or PACU : NO                    Unplanned hospital admission        Unplanned hospital admission for initially intended outpatient anesthesia service : NO      Unplanned ICU admission        Unplanned ICU admission related to anesthesia occurring within 24 hours of induction or start of MAC : NO      Surgical case cancellation        Cancellation of procedure after care already started by anesthesia care team : NO      Post-anesthesia transfer of care checklist/protocol to PACU        Transfer from OR to PACU upon case conclusion : YES              > Use of PACU transfer checklist/protocol : YES     (Includes the key elements of: patient identification, responsible practitioner identification (PACU nurse or advanced practitioner), discussion of pertinent history and procedure course, intraoperative anesthetic management, post-procedure plans, acknowledgement/questions)    Post-anesthesia transfer of care checklist/protocol to ICU        Transfer from OR to ICU upon case conclusion : YES                > Use of ICU transfer checklist/protocol : YES     (Includes the key elements of: patient identification, responsible practitioner identification (ICU nurse or  advanced practitioner), discussion of pertinent history and procedure course, intraoperative anesthetic management, post-procedure plans, acknowledgement/questions)     Post-operative nausea/vomiting risk protocol        Post-operative nausea/vomiting risk protocol : YES  Patient > or = 18 with care initiated by anesthesia team that has a risk factor screen for post-op nausea/vomiting (Includes female, hx PONV, or motion sickness, non-smoker, intended opioid administration for post-op analgesia.)    Anaphylaxis        Anaphylaxis during anesthesia services : NO    (Inclusive of any suspected transfusion reaction in association with blood-bank confirmed blood product incompatibility)              Ardith Dark, 08/22/2016 12:20 PM

## 2016-08-22 NOTE — Progress Notes (Signed)
[facility], [employer].  Progress Note    Assessment:   AKI - Due to hemodynamics    CKD Stage 3 -  Probable Diabetic Nephropathy    History of bilateral hydronephrosis - right ureteral stent, per urology nointervention at this time    DM 2 with CKD   Nephrotic range proteinuria   Atrial myxoma - s/p resectoin   CVA    Plan:   Cr stable   Continue supportive care    Troy Sine, MD  Office - [phone]  Spectra Link [phone] or TigerText  ++++++++++++++++++++++++++++++++++++++++++++++++++++++++++++++  Subjective:  No new complaints    Medications:  Scheduled Meds:  Current Facility-Administered Medications   Medication Dose Route Frequency   . amiodarone  200 mg Oral Q12H SCH   . aspirin EC  325 mg Oral Daily   . atorvastatin  20 mg Oral QHS   . chlorhexidine  15 mL Mouth/Throat Q12H SCH   . DULoxetine  60 mg Oral Daily   . furosemide  20 mg Intravenous Daily   . metoprolol  25 mg Oral Q12H SCH   . mupirocin   Nasal Q12H SCH   . potassium chloride  20 mEq Intravenous Once   . senna-docusate  2 tablet Oral BID     Continuous Infusions:  . sodium chloride 10 mL/hr at 08/22/16 1000   . insulin (regular) infusion 1.2 Units/hr (08/22/16 1500)   . ropivacaine 12 mL/hr at 08/21/16 2130     PRN Meds:acetaminophen, bisacodyl, dextrose, dextrose, fentaNYL (PF), furosemide, hydrALAZINE, hydrALAZINE, lactated ringers, ondansetron **OR** ondansetron, oxyCODONE-acetaminophen, traMADol    Objective:  Vital signs in last 24 hours:  Temp:  [97 F (36.1 C)-98.1 F (36.7 C)] 98.1 F (36.7 C)  Heart Rate:  [62-94] 73  Resp Rate:  [12-35] 19  BP: (113-148)/(55-75) 135/61  Arterial Line BP: (120-190)/(43-81) 144/57  FiO2:  [40 %] 40 %  Intake/Output last 24 hours:    Intake/Output Summary (Last 24 hours) at 08/22/16 1559  Last data filed at 08/22/16 1500   Gross per 24 hour   Intake          2303.44 ml   Output             4999 ml   Net         -2695.56 ml     Intake/Output this shift:  I/O this  shift:  In: 552.89 [I.V.:552.89]  Out: 2290 [Urine:2180; Chest Tube:110]    Physical Exam:   Gen: WD WN NAD   CV: S1 S2 N RRR   Chest: CTAB   Ab: ND NT soft no HSM +BS   Ext: No C/E    Labs:    Recent Labs  Lab 08/22/16  0157 08/22/16  0156 08/21/16  1409 08/21/16  0451 08/20/16  0604   Glucose  --  153* 161* 145* 168*   BUN  --  25.0* 24.0* 29.0* 31.0*   Creatinine  --  1.4* 1.3* 1.6* 1.8*   Calcium  --  8.8 8.5 8.9 8.9   Sodium  --  141 145 141 139   Potassium  --  5.2* 3.8 5.2* 5.0   Chloride  --  111 109 112* 110   CO2  --  24 21* 20* 23   Albumin 3.8  --   --   --   --    Phosphorus  --  4.2  --  4.7 4.8*   Magnesium  --  2.6 3.3* 1.8 2.1       Recent Labs  Lab 08/22/16  0156 08/21/16  1409 08/21/16  0449   WBC 20.88* 15.69* 8.77   Hgb 11.0* 10.6* 10.4*   Hematocrit 33.5* 31.8* 33.5*   MCV 87.2 88.1 89.1   MCH 28.6 29.4 27.7*   MCHC 32.8 33.3 31.0*   RDW 16* 15 16*   MPV 10.3 10.2 10.4   Platelets 210 184 351

## 2016-08-22 NOTE — Plan of Care (Signed)
Problem: Pain  Goal: Pain at adequate level as identified by patient  Outcome: Progressing  Patient pain levels being controlled adequately.  Patient state comfort

## 2016-08-22 NOTE — Progress Notes (Signed)
[facility] Consult Progress Note    Date Time: 08/22/16   Patient Name: Glenda [name]  Outpatient Neurologist: n/[name]    CC: difficulty speaking    Assessment:   80 yo F visiting from [place] with h/o DM, HTN, HLD, on ASA 81mg , recurrent UTIs s/p renal stent 08/05/16 (was off ASA for it), found aphasic on AM of 08/08/16. Partial L MCA syndrome on arrival to [facility], with NIHSS 10. Was out of window for IV-tPA. No LVO on CTA.     1. Acute ischemic stroke - small acute infarct in L parietal cortex (MCA territory) without hemorrhage       Mechanism of stroke is embolic - suspected to be cardioembolic:      TTE with possible myxoma --> TEE with large mass (myxoma reported) with [name] small atrial shunt        LDL 105, A1c 7.7%     Aphasia, mild R hemiparesis, L gaze preference have all resolved     2. Right atrial mass, PFO      Resected 9/29 and PFO closed. Frozen path showed lipoma.       Presumed to be the source/mechanism of her stroke, but an alternative consideration is that these were incidental and his stroke is related to occult Afib    3. Recent renal stent 08/05/16     4. H/o DM, HTN, HLD    Recommendations:   - Continue home medications  - Continue statin for secondary stroke prevention.  Plavix held for cardiac surgery but will be resumed when wires removed.  - Continue medical management by primary team  - Supportive therapies:  PT/OT/SLP    NEUROLOGY ATTENDING NOTE:  I have reviewed the history, imaging and pertinent test results. I have personally examined the patient and confirmed the major physical findings of the NP/PA note. Agree with the assessment and plan as outlined by the mid-level provider above, with any additional considerations as noted below.     Pt went to OR yesterday for cardiac surgery to resect right atrial mass, also go PFO closure.   Frozen shows lipoma. Formal pathology pending    IMP: see the above impression, which has been modified with my additions.  RECS: see the above plan, which  includes my additions.     Will follow as needed.   Please call with any questions/concerns - Neurology Consult Spectralink [phone].    Peggye Form, MD  Neurohospitalist, [facility], Spectralink [phone]  Neurology Consults:       Days (8a-4:30p): Spectra [phone]      Nights (4:30p-8a): [phone] for answering service or Spectra [phone] to leave non-urgent consult    Interval History/Subjective:   No acute neurological events reported or documented overnight.  In the interval since last seen, patient's plavix was held prior to cardiac surgery which was performed 08/21/16 and large lipomatous lesion in the interatrial septum was removed and PFO was closed.  Frozen section of mass consistent with lipoma.   Patient is awake, alert, answering questions and following commands appropriately.  Patient very slightly confused and anxious on exam.  No focal neurological deficits.  Patient had difficulty answering some questions, but quickly corrected herself or chose correct answer when presented with [name] short list.  She denies headache, dizziness, n/v, weakness, paresthesias and visual disturbance.    Medications:     Current Facility-Administered Medications   Medication Dose Route Frequency   . amiodarone  200 mg Oral Q12H SCH   . aspirin EC  325 mg Oral Daily   . atorvastatin  20 mg Oral QHS   . chlorhexidine  15 mL Mouth/Throat Q12H SCH   . DULoxetine  60 mg Oral Daily   . furosemide  20 mg Intravenous Daily   . metoprolol  25 mg Oral Q12H SCH   . mupirocin   Nasal Q12H SCH   . potassium chloride  20 mEq Intravenous Once   . senna-docusate  2 tablet Oral BID       Review of Systems:   All systems reviewed and are negative, except for those mentioned above.     Physical Exam:   Temp:  [97 F (36.1 C)-98.1 F (36.7 C)] 98.1 F (36.7 C)  Heart Rate:  [62-94] 77  Resp Rate:  [10-35] 16  BP: (105-148)/(55-75) 135/61  Arterial Line BP: (101-190)/(43-81) 142/54  FiO2:  [40 %-100 %] 40 %    Vital Signs: Reviewed.    General: well  developed, well nourished elderly woman in no acute distress. Cooperative with exam.  Follows commands appropriately   Extremities: no pedal edema, extremities normal in color    Mental Status: Patient was awake, alert, reclining in bed.  She was oriented to person, and "[facility]."  Patient initially reported the date as October 30, then changed to September and reported the year as 1917 repeatedly.  She was not able to name the president but was able to choose correctly from [name] list of three. Patient correctly reported her date of birth.  Affect is anxious.  Fund of knowledge is slightly reduced  Recent and remote memory appear slightly reduced  Language function is normal.  There is no evidence of aphasia and conversational speech.  Patient had mild difficulty with naming son at bedside but arrived at his name after brief hesitation    Cranial nerves:   -CN III, IV, VI: Gaze midline, conjugate. No gaze preference. EOMI.   -CN V: Facial sensation intact   -CN VII: Very mild left nasolabial fold flattening  -CN VIII: Hearing intact to conversational speech.  -CN IX, X: Normal phonation  -CN XI: Symmetric strength of trapezius muscles  -CN XII: Tongue protrudes midline.    Motor: Muscle bulk normal. Muscle tone normal. RUE pronator drift. Strength seems to be 5/5 throughout.    Sensory: Light touch intact  Temperature intact  Vibration reduced to ankle level of BLE    Reflexes: Toes downgoing on right.  Patient's left great toe is absent. DTRs 0 in BLE and 1+in BUE.     Coordination: FTN intact. RAM intact. No tremors.     Gait: Deferred    Labs:   Labs reviewed. Managed by primary team.     Results     Procedure Component Value Units Date/Time    Glucose Whole Blood - POCT [[id number]]  (Abnormal) Collected:  08/22/16 1209     Updated:  08/22/16 1211     POCT - Glucose Whole blood 164 (H) mg/dL     Glucose Whole Blood - POCT [[id number]]  (Abnormal) Collected:  08/22/16 1023     Updated:  08/22/16 1025      POCT - Glucose Whole blood 152 (H) mg/dL     Glucose Whole Blood - POCT [[id number]]  (Abnormal) Collected:  08/22/16 0821     Updated:  08/22/16 0823     POCT - Glucose Whole blood 135 (H) mg/dL     Glucose Whole Blood - POCT [[id number]]  (Abnormal) Collected:  08/22/16 6295  Updated:  08/22/16 0618     POCT - Glucose Whole blood 133 (H) mg/dL     Blood gas, arterial [[id number]]  (Abnormal) Collected:  08/22/16 0556    Specimen:  Blood, Arterial Updated:  08/22/16 0607     pH, Arterial 7.385     pCO2, Arterial 34.3 (L) mmhg      pO2, Arterial 87.7 mmhg      HCO3, Arterial 20.1 (L) mEq/L      Arterial Total CO2 21.2 (L) mEq/L      Base Excess, Arterial -3.8 (L) mEq/L      O2 Sat, Arterial 97.7 %      ABG CollectionSite Art Line     Temperature 37.0     FIO2 6lpm %      O2 Delivery Nasal Cannula    Glucose Whole Blood - POCT [[id number]]  (Abnormal) Collected:  08/22/16 0200     Updated:  08/22/16 0434     POCT - Glucose Whole blood 143 (H) mg/dL     Glucose Whole Blood - POCT [[id number]]  (Abnormal) Collected:  08/22/16 0427     Updated:  08/22/16 0434     POCT - Glucose Whole blood 137 (H) mg/dL     Basic Metabolic Panel [[id number]]  (Abnormal) Collected:  08/22/16 0156    Specimen:  Blood Updated:  08/22/16 0244     Glucose 153 (H) mg/dL      BUN 34.7 (H) mg/dL      Creatinine 1.4 (H) mg/dL      Calcium 8.8 mg/dL      Sodium 425 mEq/L      Potassium 5.2 (H) mEq/L      Chloride 111 mEq/L      CO2 24 mEq/L     Narrative:       Per low intensity heparin protocol.    Magnesium [[id number]] Collected:  08/22/16 0156    Specimen:  Blood Updated:  08/22/16 0244     Magnesium 2.6 mg/dL     Narrative:       Per low intensity heparin protocol.    Phosphorus [[id number]] Collected:  08/22/16 0156    Specimen:  Blood Updated:  08/22/16 0244     Phosphorus 4.2 mg/dL     Narrative:       Per low intensity heparin protocol.    GFR [[id number]] Collected:  08/22/16 0156     Updated:  08/22/16 0244     EGFR 36.3    Narrative:        Per low intensity heparin protocol.    Hepatic function panel (once POD1) [[id number]]  (Abnormal) Collected:  08/22/16 0157    Specimen:  Blood Updated:  08/22/16 0240     Bilirubin, Total 0.5 mg/dL      Bilirubin, Direct 0.3 mg/dL      Bilirubin, Indirect 0.2 mg/dL      AST (SGOT) 46 (H) U/L      ALT 32 U/L      Alkaline Phosphatase 60 U/L      Protein, Total 5.8 (L) g/dL      Albumin 3.8 g/dL      Globulin 2.0 g/dL      Albumin/Globulin Ratio 1.9    Narrative:       To be drawn on POD 1    CBC (QAM) [[id number]]  (Abnormal) Collected:  08/22/16 0156    Specimen:  Blood from Blood Updated:  08/22/16 0229     WBC 20.88 (H) x10  3/uL      Hgb 11.0 (L) g/dL      Hematocrit 16.1 (L) %      Platelets 210 x10 3/uL      RBC 3.84 (L) x10 6/uL      MCV 87.2 fL      MCH 28.6 pg      MCHC 32.8 g/dL      RDW 16 (H) %      MPV 10.3 fL      Nucleated RBC 0.0 /100 WBC      Absolute NRBC 0.00 x10 3/uL     Narrative:       To be drawn on POD 1    Glucose Whole Blood - POCT [[id number]]  (Abnormal) Collected:  08/21/16 2356     Updated:  08/22/16 0111     POCT - Glucose Whole blood 129 (H) mg/dL     Glucose Whole Blood - POCT [[id number]]  (Abnormal) Collected:  08/22/16 0105     Updated:  08/22/16 0111     POCT - Glucose Whole blood 145 (H) mg/dL     RBC OR HOLD Blood Product [[id number]] Collected:  08/20/16 1828     Updated:  08/22/16 0042     RBC Leukoreduced RBC Leukoreduced     BLUNIT [id number]     Status transfused     PRODUCT CODE (NON READABLE) E0336V00     Expiration Date 132440102725     UTYPE B POS     RBC Leukoreduced RBC Leukoreduced     BLUNIT [id number]     Status transfused     PRODUCT CODE (NON READABLE) E0336V00     Expiration Date [id number]     UTYPE B POS    Glucose Whole Blood - POCT [[id number]]  (Abnormal) Collected:  08/21/16 2304     Updated:  08/21/16 2306     POCT - Glucose Whole blood 128 (H) mg/dL     Glucose Whole Blood - POCT [[id number]]  (Abnormal) Collected:  08/21/16 2206     Updated:   08/21/16 2207     POCT - Glucose Whole blood 119 (H) mg/dL     Glucose Whole Blood - POCT [[id number]]  (Abnormal) Collected:  08/21/16 2120     Updated:  08/21/16 2123     POCT - Glucose Whole blood 109 (H) mg/dL     Glucose Whole Blood - POCT [[id number]] Collected:  08/21/16 2039     Updated:  08/21/16 2041     POCT - Glucose Whole blood 93 mg/dL     Glucose Whole Blood - POCT [[id number]]  (Abnormal) Collected:  08/21/16 1921     Updated:  08/21/16 1923     POCT - Glucose Whole blood 113 (H) mg/dL     Blood gas, arterial [[id number]]  (Abnormal) Collected:  08/21/16 1659    Specimen:  Blood, Arterial Updated:  08/21/16 1712     pH, Arterial 7.417     pCO2, Arterial 36.1 mmhg      pO2, Arterial 96.6 (H) mmhg      HCO3, Arterial 23.1 mEq/L      Arterial Total CO2 24.3 mEq/L      Base Excess, Arterial -0.9 mEq/L      O2 Sat, Arterial 99.5 %      ABG CollectionSite Art Line     Temperature 35.9     FIO2 40 %      O2 Delivery Ventilator     Rate 19  Mode: cpap     PEEP 5     Pressure Support 5     Tidal vol. 500    Glucose Whole Blood - POCT [[id number]]  (Abnormal) Collected:  08/21/16 1703     Updated:  08/21/16 1706     POCT - Glucose Whole blood 107 (H) mg/dL     Glucose Whole Blood - POCT [[id number]]  (Abnormal) Collected:  08/21/16 1646     Updated:  08/21/16 1658     POCT - Glucose Whole blood 111 (H) mg/dL     Glucose Whole Blood - POCT [[id number]]  (Abnormal) Collected:  08/21/16 1421     Updated:  08/21/16 1525     POCT - Glucose Whole blood 142 (H) mg/dL     Glucose Whole Blood - POCT [[id number]]  (Abnormal) Collected:  08/21/16 1522     Updated:  08/21/16 1525     POCT - Glucose Whole blood 150 (H) mg/dL     Protime-INR (once) [[id number]]  (Abnormal) Collected:  08/21/16 1409    Specimen:  Blood Updated:  08/21/16 1518     PT 15.2 (H) sec      PT INR 1.2 (H)     PT Anticoag. Given Within 48 hrs. heparin    GFR [[id number]] Collected:  08/21/16 1409     Updated:  08/21/16 1509     EGFR 39.6    Basic Metabolic  Panel (once) [[id number]]  (Abnormal) Collected:  08/21/16 1409    Specimen:  Blood Updated:  08/21/16 1509     Glucose 161 (H) mg/dL      BUN 95.6 (H) mg/dL      Creatinine 1.3 (H) mg/dL      Calcium 8.5 mg/dL      Sodium 387 mEq/L      Potassium 3.8 mEq/L      Chloride 109 mEq/L      CO2 21 (L) mEq/L     Magnesium (once) [[id number]]  (Abnormal) Collected:  08/21/16 1409    Specimen:  Blood Updated:  08/21/16 1509     Magnesium 3.3 (H) mg/dL     CBC (once) [[id number]]  (Abnormal) Collected:  08/21/16 1409    Specimen:  Blood from Blood Updated:  08/21/16 1436     WBC 15.69 (H) x10 3/uL      Hgb 10.6 (L) g/dL      Hematocrit 01.6 (L) %      Platelets 184 x10 3/uL      RBC 3.61 (L) x10 6/uL      MCV 88.1 fL      MCH 29.4 pg      MCHC 33.3 g/dL      RDW 15 %      MPV 10.2 fL      Nucleated RBC 0.0 /100 WBC      Absolute NRBC 0.00 x10 3/uL     Blood gas, arterial [[id number]]  (Abnormal) Collected:  08/21/16 1409    Specimen:  Blood, Arterial Updated:  08/21/16 1433     pH, Arterial 7.345 (L)     pCO2, Arterial 41.0 mmhg      pO2, Arterial 399.0 (H) mmhg      HCO3, Arterial 23.0 mEq/L      Arterial Total CO2 24.5 mEq/L      Base Excess, Arterial -3.1 (L) mEq/L      O2 Sat, Arterial >100.0 %      ABG CollectionSite Art Line     Temperature 33.4  FIO2 100 %      Rate 10     PEEP 5     Tidal vol. 418    Activated clotting time [[id number]]  (Abnormal) Collected:  08/21/16 1220    Specimen:  Blood Updated:  08/21/16 1408     ACT 127 (H)    HEPARIN ASSAY [[id number]] Collected:  08/21/16 1220     Updated:  08/21/16 1408     Heparin Assay Test Concentration 0.0 mg/kg      Total Units of Heparin Required 0     Total Protamine Dose 0 mg     Activated clotting time [[id number]]  (Abnormal) Collected:  08/21/16 1158    Specimen:  Blood Updated:  08/21/16 1408     ACT 600 (H)    HEPARIN ASSAY [[id number]] Collected:  08/21/16 1158     Updated:  08/21/16 1408     Heparin Assay Test Concentration 3.5 mg/kg      Total Units of Heparin  Required 0     Total Protamine Dose 300 mg     Activated clotting time [[id number]]  (Abnormal) Collected:  08/21/16 1128    Specimen:  Blood Updated:  08/21/16 1407     ACT 620 (H)    HEPARIN ASSAY [[id number]] Collected:  08/21/16 1128     Updated:  08/21/16 1407     Heparin Assay Test Concentration 3.5 mg/kg      Total Units of Heparin Required 0     Total Protamine Dose 0 mg     Activated clotting time [[id number]]  (Abnormal) Collected:  08/21/16 1058    Specimen:  Blood Updated:  08/21/16 1407     ACT 650 (H)    HEPARIN ASSAY [[id number]] Collected:  08/21/16 1058     Updated:  08/21/16 1407     Heparin Assay Test Concentration 3.5 mg/kg      Total Units of Heparin Required 0     Total Protamine Dose 0 mg     Activated clotting time [[id number]]  (Abnormal) Collected:  08/21/16 1028    Specimen:  Blood Updated:  08/21/16 1407     ACT 564 (H)    HEPARIN ASSAY [[id number]] Collected:  08/21/16 1028     Updated:  08/21/16 1407     Heparin Assay Test Concentration 3.5 mg/kg      Total Units of Heparin Required 0     Total Protamine Dose 0 mg     Activated clotting time [[id number]]  (Abnormal) Collected:  08/21/16 0958    Specimen:  Blood Updated:  08/21/16 1407     ACT 451 (H)    HEPARIN ASSAY [[id number]] Collected:  08/21/16 0958     Updated:  08/21/16 1407     Heparin Assay Test Concentration 3.5 mg/kg      Total Units of Heparin Required 5,000     Total Protamine Dose 0 mg     Activated clotting time [[id number]]  (Abnormal) Collected:  08/21/16 0936    Specimen:  Blood Updated:  08/21/16 1407     ACT 417 (H)    HEPARIN ASSAY [[id number]] Collected:  08/21/16 0936     Updated:  08/21/16 1407     Heparin Assay Test Concentration 3.5 mg/kg      Total Units of Heparin Required 5,000     Total Protamine Dose 0 mg     Activated clotting time [[id number]]  (Abnormal) Collected:  08/21/16 0820    Specimen:  Blood Updated:  08/21/16 1406  ACT 147 (H)    HEPARIN ASSAY [[id number]] Collected:  08/21/16 0820     Updated:   08/21/16 1406     Heparin Assay Test Concentration 3.5 mg/kg      Total Units of Heparin Required =30,000     Total Protamine Dose 0 mg     RBC OR HOLD Blood Product [[id number]] Collected:  08/20/16 1828     Updated:  08/21/16 1358     RBC Leukoreduced RBC Leukoreduced     BLUNIT [id number]     Status released     PRODUCT CODE (NON READABLE) H8469G29     Expiration Date 528413244010     UTYPE B POS     RBC Leukoreduced RBC Leukoreduced     BLUNIT [id number]     Status released     PRODUCT CODE (NON READABLE) Q2595G38     Expiration Date [id number]     UTYPE B POS          Rads:   Xr Chest 2 Views    Result Date: 08/20/2016    Negative for acute process Gerlene Burdock, MD 08/20/2016 6:20 PM     Nm Kidney W Flow And Function Mag3 W Medc    Result Date: 08/18/2016  1. Progressive clearance by right kidney with persistent tracer accumulation in renal pelvis with prolonged Lasix half clearance time in the obstructive range. No complete obstruction. Patulous collecting system can blunt Lasix response. 2. No scintigraphic evidence of urinary obstruction on the left. 3. Split renal function left 54% and right 46%.   Clide Cliff, MD 08/18/2016 5:10 PM     X-ray Chest Ap Portable (daily)    Result Date: 08/22/2016   Postop change in the chest with hypoventilatory change and atelectasis. Geanie Cooley, MD 08/22/2016 8:35 AM     X-ray Chest Ap Portable (once)    Result Date: 08/21/2016  Postoperative changes. Hypoaeration. Latanya Maudlin, MD 08/21/2016 2:23 PM         Signed by:  Norvel Richards. Clemon Chambers, FNP-BC  Nurse Practitioner  [facility]  Pager: [phone]  Spectralink [phone]  After Hours: [phone]    Please see attending Neurologist note that accompanies this mid-level encounter note.

## 2016-08-22 NOTE — Plan of Care (Signed)
Problem: Safety  Goal: Patient will be free from injury during hospitalization  Outcome: Progressing  Patient and family educated on importance of calling before trying to get up out of chair, or bed

## 2016-08-22 NOTE — Progress Notes (Signed)
[facility] ([facility] Inpatient Progress Note  ==================================================================    Date:  08/22/2016  Time: 11:41 AM      Patient Name: OZHYQMV,HQIONGEX A  Surgeon: Christene Lye, MD    Visit Type:  Inpatient Room #   FI203/FI203-01    Surgical Procedure(s): Procedure(s) (LRB):  Closure, Patent Foramen Ovalis (N/A)  Via Thoracotomy (N/A)  Excision, Mass (N/A)  TEE IN CVOR (N/A)  Post-op Day: 1 Day Post-Op    Continuous Peripheral Nerve Catheter Present?: No    Catheter #1  Catheter Location: Thoracic Paravertebral  Laterality: Right  Days Catheter In Situ: 1  Infusion Medication and Rate: Ropivacaine 0.2% currently infusing at 12 mL/hr      Opioid Medication(s):       Tramadol 50 mg PO x 4 doses in 24 hours.       Percocet 5/325 mg PO x 2 doses in 24 hours.       fentanyl 25 iv q 3 hours prn    Adjuvant Analgesic Medication(s):       Tramadol 50 mg PO x / doses in 24 hours.       Percocet 5/325 mg PO x 4 doses in 24 hours.       fentanyl 25 mcg iv q 3 hours prn    24 Hour Vital Signs and Pain Score(s): Temp:  [36.1 C (97 F)-36.6 C (97.8 F)]   Heart Rate:  [62-80]   Resp Rate:  [10-35]   BP: (105-148)/(55-75)   Arterial Line BP: (101-154)/(43-73)   SpO2:  [94 %-100 %]   Height:  [165.1 cm (5\' 5" )]   Weight:  [69.3 kg (152 lb 12.5 oz)]     Last recorded pain score:  Pain Scale Used: Numeric Scale (0-10)  Pain Score: 3-mild pain  CPOT -Total Score: 0       Subjective:  Symptoms Include:  The patient had confusion and delerium after getting a dose of dilaudid    Objective: Pain score at rest:  6    Pain score with activity:  6                     Motor block:  No                     Sensory block:  Yes                     Catheter site:   Clean, dry and intact.    Assessment:   Catheter is functioning as expected.    Plan:       - Continue local anesthetic infusion at current rate.       - [facility] will follow up in morning.    Would patient receive block  again?  Yes    Level of Care: 2    ------------------------------------------------------------------------------------------------------------------  Ardith Dark  Department of Anesthesiology  [facility]  [facility]    [facility] General Phone (All hours): [phone]  [facility] Pain Nurse (8am - 4pm): [phone]

## 2016-08-22 NOTE — Progress Notes (Addendum)
CV SURGERY ICU PROGRESS NOTE    POD: 1    Surgery: atrial septal mass excision via right thoracotomy, PFO closure    EF: 65%    Surgeon: Christene Lye, MD    Assessment/Plan:   80/F with recent CVA, found to have intracardiac mass.  Now doing well.    Neurological/?:    Acute CVA: no residual deficits, cardioembolic in etiology, Neurology following, resume Plavix when wires removed    Cardiovascular:   HD stable in SR   HTN: wean nicardipine, increase Lopressor and restart Norvasc as needed   Core Measures: Aspirin - Y; Beta blocker - Y; ACE - N; Statin - Y   Pacing wires: in    Pulmonary :   Extubated to NC, wean O2   Chest tubes (past 12 hours): 124, consider early removal if doesn't dump with standing   Chest X-ray: stable    Gastrointestinal:   Nutrition: ADAT   GI prophylaxis: Pepcid    Renal/GU:   AKI on CKD: resolving, Cr 1.4, good UOP   I/O past 24 hours: +1.6L   Foley: remove this AM    Hematological:   H&H 11/33.5, Platelets: 210   DVT prophylaxis: SCDs, consider SCH on POD #2    Infectious Disease:   Afebrile, WBC: 20.9   Lines: RIJ introducer    Endocrinological:   DM: A1c 7.7, consult Endocrine    Disposition:   Transfer to CVSDU    Subjective:   Interval History: As above.    Objective:   Vital signs for last 24 hours:  Temp:  [97.8 F (36.6 C)] 97.8 F (36.6 C)  Heart Rate:  [62-80] 68  Resp Rate:  [10-32] 22  BP: (105-148)/(55-75) 115/58  Arterial Line BP: (101-154)/(43-73) 135/46  FiO2:  [40 %-100 %] 40 %    General: in bed, NAD, bright affect  Lungs: ctab  Heart: RRR, s1/s2 present, no m/r/g  Abd: nd, +bs, soft, nt  Neuro: initially confused but then became A&Ox3, no focal deficits  Extremities: no edema  Incisions: dressings dry    Invasive ICU Hemodynamics:  PAP: (20-30)/(6-15) 29/10  CVP:  [0 mmHg-9 mmHg] 5 mmHg  CO:  [4.4 L/min-5 L/min] 5 L/min  CI:  [2.5 L/min/m2-2.9 L/min/m2] 2.9 L/min/m2    Vent Settings:  FiO2:  [40 %-100 %] 40 %  S RR:  [10] 10  S VT:  [455 mL] 455  mL  PEEP/EPAP:  [5 cm H20-6 cm H20] 5 cm H20    Medications:   Scheduled Meds:  Current Facility-Administered Medications   Medication Dose Route Frequency   . amiodarone  200 mg Oral Q12H SCH   . aspirin EC  325 mg Oral Daily   . cefuroxime  1.5 g Intravenous Q8H   . chlorhexidine  15 mL Mouth/Throat Q12H SCH   . DULoxetine  60 mg Oral Daily   . famotidine  20 mg Oral Daily   . metoprolol  25 mg Oral Q12H SCH   . mupirocin   Nasal Q12H SCH   . potassium chloride  20 mEq Intravenous Once   . senna-docusate  1 tablet Oral QHS     Continuous Infusions:  . sodium chloride Stopped (08/21/16 0612)   . sodium chloride 85 mL/hr (08/22/16 0612)   . insulin (regular) infusion 1 Units/hr (08/22/16 0600)   . niCARdipine 5 mg/hr (08/22/16 0600)   . ropivacaine 12 mL/hr at 08/21/16 2130     Labs:   CBC:  Recent Labs      08/22/16   0156   WBC  20.88*   Hgb  11.0*   Hematocrit  33.5*   Platelets  210     BMP:   Recent Labs      08/22/16   0156   Sodium  141   Potassium  5.2*   Chloride  111   CO2  24   BUN  25.0*   Creatinine  1.4*   Glucose  153*   Magnesium  2.6   Calcium  8.8   Phosphorus  4.2     LFTs:   Recent Labs      08/22/16   0157   AST (SGOT)  46*   ALT  32   Alkaline Phosphatase  60   Bilirubin, Total  0.5   Bilirubin, Direct  0.3   Bilirubin, Indirect  0.2   Protein, Total  5.8*   Albumin  3.8     INR:   Recent Labs      08/21/16   1409   PT  15.2*   PT INR  1.2*     ABG:   Recent Labs      08/22/16   0556   pH, Arterial  7.385   pO2, Arterial  87.7   pCO2, Arterial  34.3*   HCO3, Arterial  20.1*   Base Excess, Arterial  -3.8*     Microbiology (past 7 days):   None    Radiology (past 7 days):   Chest xray: see above      Jacelyn Pi, PA-C  Cardiovascular Surgery

## 2016-08-23 ENCOUNTER — Inpatient Hospital Stay: Payer: Medicare Other

## 2016-08-23 LAB — GLUCOSE WHOLE BLOOD - POCT
Whole Blood Glucose POCT: 105 mg/dL — ABNORMAL HIGH (ref 70–100)
Whole Blood Glucose POCT: 105 mg/dL — ABNORMAL HIGH (ref 70–100)
Whole Blood Glucose POCT: 106 mg/dL — ABNORMAL HIGH (ref 70–100)
Whole Blood Glucose POCT: 108 mg/dL — ABNORMAL HIGH (ref 70–100)
Whole Blood Glucose POCT: 115 mg/dL — ABNORMAL HIGH (ref 70–100)
Whole Blood Glucose POCT: 116 mg/dL — ABNORMAL HIGH (ref 70–100)
Whole Blood Glucose POCT: 118 mg/dL — ABNORMAL HIGH (ref 70–100)
Whole Blood Glucose POCT: 127 mg/dL — ABNORMAL HIGH (ref 70–100)
Whole Blood Glucose POCT: 129 mg/dL — ABNORMAL HIGH (ref 70–100)
Whole Blood Glucose POCT: 129 mg/dL — ABNORMAL HIGH (ref 70–100)
Whole Blood Glucose POCT: 131 mg/dL — ABNORMAL HIGH (ref 70–100)
Whole Blood Glucose POCT: 131 mg/dL — ABNORMAL HIGH (ref 70–100)
Whole Blood Glucose POCT: 174 mg/dL — ABNORMAL HIGH (ref 70–100)
Whole Blood Glucose POCT: 193 mg/dL — ABNORMAL HIGH (ref 70–100)
Whole Blood Glucose POCT: 210 mg/dL — ABNORMAL HIGH (ref 70–100)
Whole Blood Glucose POCT: 245 mg/dL — ABNORMAL HIGH (ref 70–100)
Whole Blood Glucose POCT: 52 mg/dL — ABNORMAL LOW (ref 70–100)
Whole Blood Glucose POCT: 96 mg/dL (ref 70–100)

## 2016-08-23 LAB — CBC
Absolute NRBC: 0 10*3/uL
Hematocrit: 32.4 % — ABNORMAL LOW (ref 37.0–47.0)
Hgb: 10.2 g/dL — ABNORMAL LOW (ref 12.0–16.0)
MCH: 28.5 pg (ref 28.0–32.0)
MCHC: 31.5 g/dL — ABNORMAL LOW (ref 32.0–36.0)
MCV: 90.5 fL (ref 80.0–100.0)
MPV: 11.6 fL (ref 9.4–12.3)
Nucleated RBC: 0 /100 WBC (ref 0.0–1.0)
Platelets: 196 10*3/uL (ref 140–400)
RBC: 3.58 10*6/uL — ABNORMAL LOW (ref 4.20–5.40)
RDW: 16 % — ABNORMAL HIGH (ref 12–15)
WBC: 18.92 10*3/uL — ABNORMAL HIGH (ref 3.50–10.80)

## 2016-08-23 LAB — BASIC METABOLIC PANEL
BUN: 38 mg/dL — ABNORMAL HIGH (ref 7.0–19.0)
CO2: 24 mEq/L (ref 22–29)
Calcium: 8.9 mg/dL (ref 7.9–10.2)
Chloride: 105 mEq/L (ref 100–111)
Creatinine: 1.5 mg/dL — ABNORMAL HIGH (ref 0.6–1.0)
Glucose: 105 mg/dL — ABNORMAL HIGH (ref 70–100)
Potassium: 4.2 mEq/L (ref 3.5–5.1)
Sodium: 137 mEq/L (ref 136–145)

## 2016-08-23 LAB — MAGNESIUM: Magnesium: 2 mg/dL (ref 1.6–2.6)

## 2016-08-23 LAB — GFR: EGFR: 33.6

## 2016-08-23 LAB — PHOSPHORUS: Phosphorus: 4 mg/dL (ref 2.3–4.7)

## 2016-08-23 MED ORDER — POLYETHYLENE GLYCOL 3350 17 G PO PACK
17.0000 g | PACK | Freq: Every day | ORAL | Status: DC
Start: 2016-08-23 — End: 2016-08-25
  Administered 2016-08-23: 17 g via ORAL
  Filled 2016-08-23: qty 1

## 2016-08-23 MED ORDER — SODIUM CHLORIDE 0.9 % IJ SOLN
3.0000 mL | Freq: Three times a day (TID) | INTRAMUSCULAR | Status: DC
Start: 2016-08-23 — End: 2016-08-25
  Administered 2016-08-24 – 2016-08-25 (×3): 3 mL via INTRAVENOUS

## 2016-08-23 MED ORDER — ROPIVACAINE HCL 2 MG/ML IJ SOLN
INTRAMUSCULAR | Status: DC
Start: 2016-08-23 — End: 2016-08-24
  Filled 2016-08-23: qty 550

## 2016-08-23 MED ORDER — HEPARIN SODIUM (PORCINE) 5000 UNIT/ML IJ SOLN
5000.0000 [IU] | Freq: Three times a day (TID) | INTRAMUSCULAR | Status: DC
Start: 2016-08-23 — End: 2016-08-25
  Administered 2016-08-23 – 2016-08-25 (×7): 5000 [IU] via SUBCUTANEOUS
  Filled 2016-08-23 (×7): qty 1

## 2016-08-23 NOTE — Plan of Care (Signed)
Problem: Safety  Goal: Patient will be free from injury during hospitalization   08/23/16 2337   Goal/Interventions addressed this shift   Patient will be free from injury during hospitalization  Assess patient's risk for falls and implement fall prevention plan of care per policy;Provide and maintain safe environment;Use appropriate transfer methods;Ensure appropriate safety devices are available at the bedside;Hourly rounding;Include patient/ family/ care giver in decisions related to safety;Assess for patients risk for elopement and implement Elopement Risk Plan per policy;Provide alternative method of communication if needed (communication boards, writing)       Problem: Pain  Goal: Pain at adequate level as identified by patient  Outcome: Progressing   08/23/16 2337   Goal/Interventions addressed this shift   Pain at adequate level as identified by patient Identify patient comfort function goal;Assess for risk of opioid induced respiratory depression, including snoring/sleep apnea. Alert healthcare team of risk factors identified.;Assess pain on admission, during daily assessment and/or before any "as needed" intervention(s);Reassess pain within 30-60 minutes of any procedure/intervention, per Pain Assessment, Intervention, Reassessment (AIR) Cycle;Evaluate if patient comfort function goal is met;Evaluate patient's satisfaction with pain management progress;Offer non-pharmacological pain management interventions;Consult/collaborate with Pain Service;Consult/collaborate with Physical Therapy, Occupational Therapy, and/or Speech Therapy       Problem: Post-op Phase - Cardiac Surgery  Goal: Mobility/activity is maintained at optimal level  Outcome: Progressing   08/23/16 2337   Goal/Interventions addressed this shift   Mobility/activity is maintained at optimal level  Increase mobility as tolerated/progressive mobility;Get patient OOB to chair for meals;Encourage independent activity per ability. Reinforce sternal  precautions.;Plan activities to conserve energy. Plan rest periods.       Comments: Pt. Alert,follows command,afib rate control on tele,on nerve block intact,undteady,needs 1-2 person assist with activity,Insulin drip/NS at [facility] infusing well,chest/ groin incisions OTA,CDI,incontinent of urine,diaper,pad,changed as needed,SCDs in use,safety precaution measures maintained-high fall risk,BP elevated at start of shift but down after BP meds given,will continue plan of care.

## 2016-08-23 NOTE — Progress Notes (Signed)
CV SURGERY SDU PROGRESS NOTE    POD: 2    Surgery: atrial septal mass excision via right thoracotomy, PFO closure    EF: 65%    Surgeon: Christene Lye, MD    Assessment/Plan:       Neurological/?:    Acute CVA: no residual deficits, cardioembolic in etiology, Neurology following, resume Plavix when wires removed    Cardiovascular:   Afib- 50's-70's,  amio   HTN: [place]. Stable   Core Measures: Aspirin - Y; Beta blocker - Y; ACE - N; Statin - Y   Pacing wires: in    Pulmonary :   2 Lnc- wean.    Chest tubes: remove   Chest X-ray: stable    Gastrointestinal:   Nutrition: ADAT   GI prophylaxis: Pepcid    Renal/GU:   AKI on CKD: resolving, Cr 1.5, good UOP   Lasix daily   I/O past 24 hours:- 2.7L   Foley: removed    Hematological:   Hct 11/32.4.   ASA   DVT prophylaxis: SCDs, Hep SQ    Infectious Disease:   Afebrile, WBC:18.2    Endocrinological:   DM: A1c 7.7, consult Endocrine    Plan:   Afib- amio. Leave temp pacing wires in.    HTN- continue metop   AKI/CKD- as per Nephrol. Lasix daily       Subjective:   Interval History: As above.    Objective:   Vital signs for last 24 hours:  Temp:  [98.1 F (36.7 C)-98.6 F (37 C)] 98.4 F (36.9 C)  Heart Rate:  [62-94] 74  Resp Rate:  [14-29] 16  BP: (135-162)/(61-74) 162/74  Arterial Line BP: (142-190)/(54-81) 152/59    Neuro: Awake, alert  Lungs: decreased at bases  Heart: Irreg,irreg  Abd: soft + BS  Extremities: warm, some edema  Incisions: dressings dry        Medications:   Scheduled Meds:  Current Facility-Administered Medications   Medication Dose Route Frequency   . amiodarone  200 mg Oral Q12H SCH   . aspirin EC  325 mg Oral Daily   . atorvastatin  20 mg Oral QHS   . DULoxetine  60 mg Oral Daily   . furosemide  20 mg Intravenous Daily   . heparin (porcine)  5,000 Units Subcutaneous Q8H SCH   . metoprolol  25 mg Oral Q12H SCH   . mupirocin   Nasal Q12H SCH   . polyethylene glycol  17 g Oral Daily   . senna-docusate  2 tablet Oral BID   .  sodium chloride (PF)  3 mL Intravenous Q8H     Continuous Infusions:  . sodium chloride 10 mL/hr at 08/22/16 1000   . insulin (regular) infusion 0.9 Units/hr (08/23/16 1135)   . ropivacaine 12 mL/hr at 08/23/16 0953     Labs:   CBC:   Recent Labs      08/23/16   0449   WBC  18.92*   Hgb  10.2*   Hematocrit  32.4*   Platelets  196     BMP:   Recent Labs      08/23/16   0643   Sodium  137   Potassium  4.2   Chloride  105   CO2  24   BUN  38.0*   Creatinine  1.5*   Glucose  105*   Magnesium  2.0   Calcium  8.9   Phosphorus  4.0     LFTs:   Recent  Labs      08/22/16   0157   AST (SGOT)  46*   ALT  32   Alkaline Phosphatase  60   Bilirubin, Total  0.5   Bilirubin, Direct  0.3   Bilirubin, Indirect  0.2   Protein, Total  5.8*   Albumin  3.8     INR:   Recent Labs      08/21/16   1409   PT  15.2*   PT INR  1.2*     ABG:   Recent Labs      08/22/16   0556   pH, Arterial  7.385   pO2, Arterial  87.7   pCO2, Arterial  34.3*   HCO3, Arterial  20.1*   Base Excess, Arterial  -3.8*     Microbiology (past 7 days):   None    Radiology (past 7 days):   Chest xray: see above      Roma Schanz, PA-C  Cardiovascular Surgery

## 2016-08-23 NOTE — Progress Notes (Signed)
[facility] ([facility] Inpatient Progress Note  ==================================================================    Date:  08/23/2016  Time: 8:22 AM      Patient Name: Glenda Green  Surgeon: Christene Lye, MD    Visit Type:  Inpatient Room #   [phone]    Surgical Procedure(s): Procedure(s) (LRB):  Closure, Patent Foramen Ovalis (N/Green)  Via Thoracotomy (N/Green)  Excision, Mass (N/Green)  TEE IN CVOR (N/Green)  Post-op Day: 2 Days Post-Op    Continuous Peripheral Nerve Catheter Present?: No    Catheter #1  Catheter Location: Thoracic Paravertebral  Laterality: Right  Days Catheter In Situ:2   Infusion Medication and Rate: Ropivacaine 0.2% currently infusing at 12 mL/hr      Opioid Medication(s):       Tramadol 50 mg PO x 4 doses in 24 hours.       Percocet 5/325 mg PO x 2 doses in 24 hours.       fentanyl 25 iv q 3 hours prn    Adjuvant Analgesic Medication(s):  Tylenol 650 mg q 4 hours prn    24 Hour Vital Signs and Pain Score(s): Temp:  [36.1 C (97 F)-37 C (98.6 F)]   Heart Rate:  [62-94]   Resp Rate:  [12-35]   BP: (135-153)/(61-69)   Arterial Line BP: (135-190)/(48-81)   SpO2:  [92 %-99 %]   Weight:  [72.4 kg (159 lb 9.6 oz)]     Last recorded pain score:  Pain Scale Used: Numeric Scale (0-10)  Pain Score: 3-mild pain       Subjective:  Symptoms Include:  The patient had confusion and delerium after getting Green dose of dilaudid    Objective: Pain score at rest:  6    Pain score with activity:  6                     Motor block:  No                     Sensory block:  Yes                     Catheter site:   Clean, dry and intact.    Assessment:   Catheter is functioning as expected.    Plan:       - Continue local anesthetic infusion at current rate.       - [facility] will follow up in morning.        - Consider starting neurontin 100 tid if appropriate    Would patient receive block again?  Yes    Level of Care:  2    ------------------------------------------------------------------------------------------------------------------  Ardith Dark  Department of Anesthesiology  [facility]  [facility]    [facility] General Phone (All hours): [phone]  [facility] Pain Nurse (8am - 4pm): [phone]

## 2016-08-23 NOTE — Plan of Care (Signed)
Problem: Post-op Phase - Cardiac Surgery  Goal: Effective breathing pattern is maintained  Outcome: Progressing   08/23/16 0428   Goal/Interventions addressed this shift   Effective breathing pattern is maintained  Monitor for medication-induced respiratory depression;Position patient for maximum ventilatory efficiency with HOB to a minimum of 30 degrees when hemodynamically stable;Maintain SpO2 level per LIP order;Monitor for sleep apnea       Comments: Patient has been stable alert and obeys command. Patient is mildly forgetful. She is POD 2 with chest tube to drainage. On Insulin drip with stable BG. VS have been stable. Patient has small BM and has been urinating ok.

## 2016-08-23 NOTE — Plan of Care (Signed)
Problem: Post-op Phase - Cardiac Surgery  Goal: Patient will remain free from post-op complications  Outcome: Progressing

## 2016-08-23 NOTE — Plan of Care (Signed)
Problem: Safety  Goal: Patient will be free from injury during hospitalization  Outcome: Progressing

## 2016-08-23 NOTE — Progress Notes (Signed)
[facility] [employer]  Progress Note    Assessment:   AKI - Due to hemodynamics    CKD Stage 3 -  Probable Diabetic Nephropathy    History of bilateral hydronephrosis - right ureteral stent, per urology nointervention at this time    DM 2 with CKD   Nephrotic range proteinuria   Atrial myxoma - s/p resectoin   CVA    Plan:   Cr remains stable   Post op care per CT Surgery    Troy Sine, MD  Office - [phone]  [employer] [phone] or TigerText  ++++++++++++++++++++++++++++++++++++++++++++++++++++++++++++++  Subjective:  No new complaints    Medications:  Scheduled Meds:  Current Facility-Administered Medications   Medication Dose Route Frequency   . amiodarone  200 mg Oral Q12H SCH   . aspirin EC  325 mg Oral Daily   . atorvastatin  20 mg Oral QHS   . DULoxetine  60 mg Oral Daily   . furosemide  20 mg Intravenous Daily   . heparin (porcine)  5,000 Units Subcutaneous Q8H SCH   . metoprolol  25 mg Oral Q12H SCH   . mupirocin   Nasal Q12H SCH   . polyethylene glycol  17 g Oral Daily   . senna-docusate  2 tablet Oral BID   . sodium chloride (PF)  3 mL Intravenous Q8H     Continuous Infusions:  . sodium chloride 10 mL/hr at 08/22/16 1000   . insulin (regular) infusion 0.9 Units/hr (08/23/16 1135)   . ropivacaine 12 mL/hr at 08/23/16 0953     PRN Meds:acetaminophen, bisacodyl, dextrose, dextrose, ondansetron **OR** ondansetron, oxyCODONE-acetaminophen, traMADol    Objective:  Vital signs in last 24 hours:  Temp:  [98.1 F (36.7 C)-98.6 F (37 C)] 98.4 F (36.9 C)  Heart Rate:  [62-94] 74  Resp Rate:  [14-29] 16  BP: (135-162)/(61-74) 162/74  Arterial Line BP: (142-190)/(54-81) 152/59  Intake/Output last 24 hours:    Intake/Output Summary (Last 24 hours) at 08/23/16 1145  Last data filed at 08/23/16 0500   Gross per 24 hour   Intake           368.77 ml   Output             2331 ml   Net         -1962.23 ml     Intake/Output this shift:  No intake/output data  recorded.    Physical Exam:   Gen: WD WN NAD   CV: S1 S2 N RRR   Chest: CTAB   Ab: ND NT soft no HSM +BS   Ext: No C/E    Labs:    Recent Labs  Lab 08/23/16  0643 08/22/16  0157 08/22/16  0156 08/21/16  1409 08/21/16  0451   Glucose 105*  --  153* 161* 145*   BUN 38.0*  --  25.0* 24.0* 29.0*   Creatinine 1.5*  --  1.4* 1.3* 1.6*   Calcium 8.9  --  8.8 8.5 8.9   Sodium 137  --  141 145 141   Potassium 4.2  --  5.2* 3.8 5.2*   Chloride 105  --  111 109 112*   CO2 24  --  24 21* 20*   Albumin  --  3.8  --   --   --    Phosphorus 4.0  --  4.2  --  4.7   Magnesium 2.0  --  2.6 3.3* 1.8  Recent Labs  Lab 08/23/16  0449 08/22/16  0156 08/21/16  1409   WBC 18.92* 20.88* 15.69*   Hgb 10.2* 11.0* 10.6*   Hematocrit 32.4* 33.5* 31.8*   MCV 90.5 87.2 88.1   MCH 28.5 28.6 29.4   MCHC 31.5* 32.8 33.3   RDW 16* 16* 15   MPV 11.6 10.3 10.2   Platelets 196 210 184

## 2016-08-23 NOTE — Plan of Care (Signed)
A&O x4. VSS. Afib in 70s since 0700 today. Pacer wires grounded. 02 sats 94% on RA. Chest tube d/c'd. Tolerating consistent carb/cardiac diet. -BM. Pt is incontinent of urine and stool. Out of bed with 2 person assist and walker; walked in hall x2. Skin tear to LUA. RUA midline flushed. Insulin gtt followed per glucomander. Right chest incision OTA and CDI. Nerve block to left shoulder area. PRN tramadol given for pain; PRN percocet given for pain, but made patient extremely drowsy, so med has been d/c'd. Call bell within reach, SCDs on, and fall mat down. Will continue to monitor.

## 2016-08-24 ENCOUNTER — Encounter: Payer: Self-pay | Admitting: Thoracic Surgery (Cardiothoracic Vascular Surgery)

## 2016-08-24 DIAGNOSIS — E1165 Type 2 diabetes mellitus with hyperglycemia: Secondary | ICD-10-CM

## 2016-08-24 DIAGNOSIS — Z794 Long term (current) use of insulin: Secondary | ICD-10-CM

## 2016-08-24 DIAGNOSIS — N179 Acute kidney failure, unspecified: Secondary | ICD-10-CM

## 2016-08-24 LAB — BASIC METABOLIC PANEL
BUN: 36 mg/dL — ABNORMAL HIGH (ref 7.0–19.0)
CO2: 25 mEq/L (ref 22–29)
Calcium: 8.3 mg/dL (ref 7.9–10.2)
Chloride: 99 mEq/L — ABNORMAL LOW (ref 100–111)
Creatinine: 1.8 mg/dL — ABNORMAL HIGH (ref 0.6–1.0)
Glucose: 216 mg/dL — ABNORMAL HIGH (ref 70–100)
Potassium: 4.5 mEq/L (ref 3.5–5.1)
Sodium: 133 mEq/L — ABNORMAL LOW (ref 136–145)

## 2016-08-24 LAB — CBC
Absolute NRBC: 0 10*3/uL
Hematocrit: 32.4 % — ABNORMAL LOW (ref 37.0–47.0)
Hgb: 10.4 g/dL — ABNORMAL LOW (ref 12.0–16.0)
MCH: 28.7 pg (ref 28.0–32.0)
MCHC: 32.1 g/dL (ref 32.0–36.0)
MCV: 89.5 fL (ref 80.0–100.0)
MPV: 11 fL (ref 9.4–12.3)
Nucleated RBC: 0 /100 WBC (ref 0.0–1.0)
Platelets: 189 10*3/uL (ref 140–400)
RBC: 3.62 10*6/uL — ABNORMAL LOW (ref 4.20–5.40)
RDW: 15 % (ref 12–15)
WBC: 17.22 10*3/uL — ABNORMAL HIGH (ref 3.50–10.80)

## 2016-08-24 LAB — GLUCOSE WHOLE BLOOD - POCT
Whole Blood Glucose POCT: 122 mg/dL — ABNORMAL HIGH (ref 70–100)
Whole Blood Glucose POCT: 130 mg/dL — ABNORMAL HIGH (ref 70–100)
Whole Blood Glucose POCT: 131 mg/dL — ABNORMAL HIGH (ref 70–100)
Whole Blood Glucose POCT: 135 mg/dL — ABNORMAL HIGH (ref 70–100)
Whole Blood Glucose POCT: 136 mg/dL — ABNORMAL HIGH (ref 70–100)
Whole Blood Glucose POCT: 148 mg/dL — ABNORMAL HIGH (ref 70–100)
Whole Blood Glucose POCT: 156 mg/dL — ABNORMAL HIGH (ref 70–100)
Whole Blood Glucose POCT: 196 mg/dL — ABNORMAL HIGH (ref 70–100)
Whole Blood Glucose POCT: 210 mg/dL — ABNORMAL HIGH (ref 70–100)
Whole Blood Glucose POCT: 210 mg/dL — ABNORMAL HIGH (ref 70–100)
Whole Blood Glucose POCT: 260 mg/dL — ABNORMAL HIGH (ref 70–100)

## 2016-08-24 LAB — GFR: EGFR: 27.2

## 2016-08-24 LAB — LAB USE ONLY - HISTORICAL SURGICAL PATHOLOGY

## 2016-08-24 LAB — MAGNESIUM: Magnesium: 1.7 mg/dL (ref 1.6–2.6)

## 2016-08-24 MED ORDER — INSULIN LISPRO 100 UNIT/ML SC SOLN
1.0000 [IU] | Freq: Every evening | SUBCUTANEOUS | Status: DC | PRN
Start: 2016-08-24 — End: 2016-08-25
  Administered 2016-08-24: 2 [IU] via SUBCUTANEOUS

## 2016-08-24 MED ORDER — ASPIRIN 81 MG PO TBEC
81.0000 mg | DELAYED_RELEASE_TABLET | Freq: Every day | ORAL | Status: DC
Start: 2016-08-25 — End: 2016-08-25
  Administered 2016-08-25: 81 mg via ORAL
  Filled 2016-08-24: qty 1

## 2016-08-24 MED ORDER — ON-Q PUMP SINGLE FLOW
Status: DC
Start: 2016-08-25 — End: 2016-08-24

## 2016-08-24 MED ORDER — BENZOCAINE-MENTHOL 15-3.6 MG MT LOZG
1.0000 | LOZENGE | OROMUCOSAL | Status: DC | PRN
Start: 2016-08-24 — End: 2016-08-25
  Administered 2016-08-24: 1 via BUCCAL
  Filled 2016-08-24: qty 1

## 2016-08-24 MED ORDER — BUMETANIDE 0.25 MG/ML IJ SOLN
1.0000 mg | Freq: Once | INTRAMUSCULAR | Status: AC
Start: 2016-08-24 — End: 2016-08-24
  Administered 2016-08-24: 1 mg via INTRAVENOUS
  Filled 2016-08-24: qty 4

## 2016-08-24 MED ORDER — CLOPIDOGREL BISULFATE 75 MG PO TABS
75.0000 mg | ORAL_TABLET | Freq: Every day | ORAL | Status: DC
Start: 2016-08-24 — End: 2016-08-25
  Administered 2016-08-24 – 2016-08-25 (×2): 75 mg via ORAL
  Filled 2016-08-24 (×2): qty 1

## 2016-08-24 MED ORDER — INSULIN LISPRO 100 UNIT/ML SC SOLN
1.0000 [IU] | Freq: Three times a day (TID) | SUBCUTANEOUS | Status: DC | PRN
Start: 2016-08-24 — End: 2016-08-25
  Administered 2016-08-24: 2 [IU] via SUBCUTANEOUS
  Administered 2016-08-25: 4 [IU] via SUBCUTANEOUS
  Filled 2016-08-24: qty 6
  Filled 2016-08-24: qty 12

## 2016-08-24 MED ORDER — MAGNESIUM SULFATE IN D5W 1-5 GM/100ML-% IV SOLN
1.0000 g | Freq: Once | INTRAVENOUS | Status: AC
Start: 2016-08-24 — End: 2016-08-24
  Administered 2016-08-24: 1 g via INTRAVENOUS
  Filled 2016-08-24: qty 100

## 2016-08-24 MED ORDER — BUMETANIDE 0.25 MG/ML IJ SOLN
1.0000 mg | Freq: Once | INTRAMUSCULAR | Status: DC
Start: 2016-08-24 — End: 2016-08-24
  Filled 2016-08-24: qty 4

## 2016-08-24 MED ORDER — GLUCAGON 1 MG IJ SOLR (WRAP)
1.0000 mg | INTRAMUSCULAR | Status: DC | PRN
Start: 2016-08-24 — End: 2016-08-25

## 2016-08-24 MED ORDER — INSULIN GLARGINE 100 UNIT/ML SC SOLN
8.0000 [IU] | Freq: Every evening | SUBCUTANEOUS | Status: DC
Start: 2016-08-24 — End: 2016-08-25
  Administered 2016-08-24: 8 [IU] via SUBCUTANEOUS
  Filled 2016-08-24: qty 1

## 2016-08-24 NOTE — PT Eval Note (Signed)
[facility]   Physical Therapy Reassessment     Patient: ADRIEANA FENNELLY    MRN#: [id number]   [facility]  Bed: FI259/FI259-01    Discharge Recommendations:     Discharge Recommendation: SNF   DME Recommendation: TBD at next level of care    If SNF recommended discharge disposition is not available, patient will need min-mod assist for mobility, use of RW, BSC, shower chair, w/c equipment, and HHPT.        Assessment:   TAKAYA HYSLOP is a 80 y.o. female admitted 08/08/2016. Patient now with new PT orders s/p procedure below. Patient presents today with decreased alertness. Patient falling asleep mid sentence at times. Patient oriented to self and place but noted to have some confusion. Patient requiring significantly more assistance with mobility compared to last PT session. Patient presents with unsteady gait, generalized weakness, decreased activity tolerance and decreased ind with mobility. Patient required seated rest break in between bouts of ambulation due to fatigue. Patient would benefit from continued skilled PT for modified goals below.    Therapy Diagnosis: decreased ind with mobility    Rehabilitation Potential: good    Treatment Activities: PT Re-evaluation; gait training with RW, encouraged ankle pumps  Educated the patient to role of physical therapy, plan of care, goals of therapy and HEP, safety with mobility and ADLs, energy conservation techniques, home safety.    Plan:   PT Frequency: 2-3x/wk    Treatment/Interventions: bed mobility, gait, balance, transfers, HEP, energy conservation    Risks/benefits/POC discussed with patient       Reason for reassessment: New PT orders s/p resection of cardiac mass, thoracotomy on 08/21/16    Precautions and Contraindications:   Falls      Consult received for Leonel Ramsay for PT Evaluation and Treatment.  Patient's medical condition is appropriate for Physical Therapy intervention at this time.      History of  Present Illness:   Updated History of Present Illness: ZILLA SHARTZER is a 80 y.o. female s/p procedure below, DOS= 08/21/16     Resection cardiac mass  Closure PFO  TEE        Admitting Diagnosis: Cerebrovascular accident (CVA), unspecified mechanism [I63.9]  CVA (cerebral vascular accident) [I63.9]  Cerebral infarction, unspecified [I63.9]    Updated imaging, tests, labs:     Resection cardiac mass  Closure PFO  TEE    IMPRESSION: CXR 9/30   Postop change in the chest with hypoventilatory change and  Atelectasis    IMPRESSION: CXR 9/30   1. No change in lines and tubes.  2. Minimal basilar atelectasis. Negative for pneumothorax        Subjective:   Patient is agreeable to participation in the therapy session. Nursing clears patient for therapy.     Patient Goal: none stated    Pain:   denies    Objective:   Patient is seated in a cardiac chair with telemetry, SCD's and ONQ pump in place.    Cognitive Status and Neuro Exam:  Drowsy, in and out of alertness; some confusion noted  Oriented to self and place  Follows directions    Musculoskeletal Examination  RUE ROM: shoulder flexion limited 25% due to pain with elevation (thoracotomy)  LUE ROM: wfl  RLE ROM: wfl  LLE ROM: wfl    RUE Strength: NT  LUE Strength: at least 3/5  RLE Strength: 4-/5 grossly  LLE Strength: 4-/5 grossly  Functional Mobility  Rolling: NT (already up)  Supine to Sit: NT (already up)  Scooting: cgA to edge of chair, cues  Sit to Stand: minA x 1 from cardiac chair; minA x 2 from lower chair  Stand to Sit: minA  Transfers: modA with RW    Ambulation  Level of assistance required: modA  Ambulation Distance: 20 feet x 2 (seated rest in between)  Pattern: very unsteady, short step length, slow pace, needs cues for safety  Device Used: RW  Weightbearing Status: FWB  Stair Management: NT (currently unsafe for trial)      Balance  Static Sitting: ind  Dynamic Sitting: sup/cg  Static Standing: min with RW  Dynamic Standing: mod with  RW    Participation and Activity Tolerance  Participation Effort: good  Endurance: fair; fatigues easily    Patient left with call bell within reach, all needs met, SCDs on, fall mat in place, bed alarm off, chair alarm ON, son at bedside and all questions answered. RN notified of session outcome and patient response.     Goals:  Goals  Goal Formulation: With patient/family  Time for Goal Acheivement: 5 visits  Goals: Select goal  Pt Will Go Supine To Sit: with contact guard assist  Pt Will Sit Edge of Bed: 3-5 min, with supervision, to maximize functional mobility and independence (without LOB and neutral sitting balance)  Pt Will Transfer Bed/Chair: with rolling walker, with contact guard assist  Pt Will Ambulate: 51-100 feet, with rolling walker, with contact guard assist  Pt Will Go Up / Down Stairs: 3-5 stairs, with minimal assist, With rail  Pt Will Perform Home Exer Program: with supervision (cues)      Theodora Blow, PT  [phone]    Time of Treatment:  PT Received On: 08/24/16  Start Time: 0955  Stop Time: 1050  Time Calculation (min): 55 min

## 2016-08-24 NOTE — Consults (Signed)
ENDOCRINE NEW CONSULT    Date Time: 08/24/16 2:53 PM  Patient Name: [name],[name]  Requesting Physician: Christene Lye, MD  Consulting Physician: Heber Carolina, MD    Primary Care Physician: Christa See, MD    Reason for Consultation: management of hyperglycemia with underlying T2DM      Assessment/Plan:     1. Hyperglycemia with underlying T2DM: HgbA1c 7.7%, diagnosed about 20 years ago, managed by PMD. Home regimen includes lantus 10 units qhs and victoza 1.8 mg sc qHS. Morning glucose usually 130-140s per patient, denies hypoglycemia. Denies complications such as retinopathy or neuropathy. Son helps with injections.      -  BG 122, 210 so far today - the latter measured after consuming fruit   - Insulin infusion discontinued this morning   - No basal insulin for now, will monitor given reduced po intake. Patient is sleepy, pain medication discontinued now.   - Will start low dose humalog SSI qac and qhs PRN    - Creatinine 1.8 today   - Recommend discharge with home regimen given acceptable hgba1c. Will need follow up with PMD on discharge.    - Please call x [phone] or page [phone] with questions    Christene Lye, MD, thank you for this consultation.  We will follow the patient with you during this hospitalization.  Please contact me with any questions or issues.    Total time of encounter was 80 min > 50% time was spent reviewing her chart, updating her orders, and at the bedside counseling & coordination of care regarding the above needs.    Clyde Canterbury, MD  [facility]  [phone] or Tiger Text or pager ID: [phone]   Hours Mon. - Friday 8- 5:00pm  On nights, holidays and weekends please page the endocrine consult pager [phone]) for emergencies.       History of Presenting Illness:   Glenda Green is a 80 y.o. female with history of T2DM, HTN, HLD,recurrent UTI s/p renal stent placement, that was admitted 08/08/16 with aphasia found to have acute CVA and  right atrial myxoma, now POD 3 s/p atrial septal mass excision via right thoracotomy, PFO closure, EF 65%. Consult for diabetes management during hospital course. HgbA1c 7.7%, diagnosed about 20 years ago, managed by PMD. Home regimen includes victoza and lantus as above. Patient states her son helps with injections. Reports morning glucose usually 130-140s, denies hypoglycemia. Denies significant weight changes over the past 6 months. Currently seen in no acute distress, sleepy. Denies nausea, vomiting, abdominal pain. Reduced appetite.     Past Medical History:     Past Medical History:   Diagnosis Date   . Arthritis    . Coronary artery disease    . Depression    . Difficulty walking     unsteady gait uses walker/cane/wheelchair   . Hyperlipidemia    . Hypertension    . Kidney disorder    . Type 2 diabetes mellitus, controlled    . Urinary tract infection     multiple time on Nitrofurantion maintaining dose       Available old records reviewed, including:  Epic notes    Past Surgical History:     Past Surgical History:   Procedure Laterality Date   . AMPUTATION, TOES, MULTIPLE Left 2014   . CLOSURE, PATENT FORAMEN OVALIS N/A 08/21/2016    Procedure: Closure, Patent Foramen Ovalis;  Surgeon: Christene Lye, MD;  Location: [facility] HEART OR;  Service: Cardiothoracic;  Laterality: N/A;   . CORONARY ARTERY BYPASS  2010   . CYSTOSCOPY, URETERAL STENT INSERTION  08/05/2016    Procedure: CYSTOSCOPY,RIGHT/RETROGRADE, RIGHT/ URETEROSCOPY, STENT PLACEMENT.;  Surgeon: Otelia Santee, MD;  Location: [facility] OR;  Service: Urology;;  CYSTOSCOPY,RIGHT/RETROGRADE, RIGHT/ URETEROSCOPY, STENT PLACEMENT.   Marland Kitchen EXCISION, MASS N/A 08/21/2016    Procedure: Excision, Mass;  Surgeon: Christene Lye, MD;  Location: [facility] HEART OR;  Service: Cardiothoracic;  Laterality: N/A;  excision of cardiac mass   . FOOT AMPUTATION  2015    x2 three toes   . JOINT REPLACEMENT  2010    left knee   . TEE N/A 08/21/2016    Procedure: TEE IN CVOR;   Surgeon: Christene Lye, MD;  Location: [facility] HEART OR;  Service: Cardiothoracic;  Laterality: N/A;   . VIA THORACOTOMY N/A 08/21/2016    Procedure: Via Thoracotomy;  Surgeon: Christene Lye, MD;  Location: [facility] HEART OR;  Service: Cardiothoracic;  Laterality: N/A;       Family History:     Family History   Problem Relation Age of Onset   . Diabetes type II Mother        Social History:     History   Smoking Status   . Never Smoker   Smokeless Tobacco   . Never Used     History   Alcohol Use No     History   Drug Use No       Allergies:     Allergies   Allergen Reactions   . Tape Itching       Medications:     Prescriptions Prior to Admission   Medication Sig   . aspirin EC 81 MG EC tablet Take 81 mg by mouth daily.   Marland Kitchen atorvastatin (LIPITOR) 40 MG tablet Take 40 mg by mouth daily.   . DULoxetine (CYMBALTA) 60 MG capsule Take 60 mg by mouth daily.   . ferrous sulfate 325 (65 FE) MG tablet Take 325 mg by mouth every morning with breakfast.   . Insulin Glargine (LANTUS SC) Inject 10 Unit into the skin nightly.Took 5 units 08/04/16       . liraglutide (VICTOZA) 18 MG/3ML Solution Pen-injector Inject 1.8 each into the skin daily.       . metoprolol XL (TOPROL-XL) 25 MG 24 hr tablet Take 25 mg by mouth daily.   . nitrofurantoin (MACRODANTIN) 50 MG capsule Take 2 capsules (100 mg total) by mouth every 12 (twelve) hours.   . Probiotic Product (PROBIOTIC ADVANCED PO) Take by mouth.       Current Facility-Administered Medications   Medication Dose Route Frequency   . amiodarone  200 mg Oral Q12H SCH   . aspirin EC  325 mg Oral Daily   . atorvastatin  20 mg Oral QHS   . bumetanide  1 mg Intravenous Once   . DULoxetine  60 mg Oral Daily   . heparin (porcine)  5,000 Units Subcutaneous Q8H SCH   . magnesium sulfate  1 g Intravenous Once   . metoprolol  25 mg Oral Q12H SCH   . mupirocin   Nasal Q12H SCH   . polyethylene glycol  17 g Oral Daily   . senna-docusate  2 tablet Oral BID   . sodium chloride (PF)  3 mL Intravenous Q8H             Review of Systems:   All other systems were reviewed and are negative  except as mentioned in HPI    Physical Exam:   Temp:  [97.2 F (36.2 C)-98.3 F (36.8 C)] 98.2 F (36.8 C)  Heart Rate:  [64-82] 68  Resp Rate:  [16-18] 18  BP: (131-178)/(67-82) 131/67  Body mass index is 25.69 kg/m.    Intake/Output Summary (Last 24 hours) at 08/24/16 1453  Last data filed at 08/24/16 0600   Gross per 24 hour   Intake           1220.7 ml   Output                0 ml   Net           1220.7 ml       General: awake, alert, oriented x 3; no acute distress.  HEENT: perrla, eomi, sclera anicteric  oropharynx clear without lesions, mucous membranes moist  Neck: supple, no lymphadenopathy, no thyromegaly, no JVD, no carotid bruits  Cardiovascular: regular rate and rhythm, no murmurs, rubs or gallops  Lungs: clear to auscultation bilaterally, without wheezing, rhonchi, or rales  Abdomen: soft, non-tender, non-distended; +bowel sounds  Extremities: no clubbing, cyanosis, or edema  Neuro: A+O x 3, cranial nerves grossly intact, strength 5/5 in upper and lower extremities, sensation intact  Skin: no rashes or lesions noted      Labs:     POCT - Glucose Whole blood   Date/Time Value Ref Range Status   08/24/2016 1158 210 (H) 70 - 100 mg/dL Final   96/02/5408 8119 122 (H) 70 - 100 mg/dL Final   14/78/2956 2130 156 (H) 70 - 100 mg/dL Final   86/57/8469 6295 135 (H) 70 - 100 mg/dL Final   28/41/3244 0102 131 (H) 70 - 100 mg/dL Final       Hemoglobin A1C (%)   Date Value   08/09/2016 7.7 (H)       Recent Labs      08/24/16   1231  08/23/16   0449   WBC  17.22*  18.92*   Hgb  10.4*  10.2*   Hematocrit  32.4*  32.4*   Platelets  189  196       Recent Labs      08/24/16   1230  08/23/16   0643  08/22/16   0156   Sodium  133*  137  141   Potassium  4.5  4.2  5.2*   Chloride  99*  105  111   CO2  25  24  24    BUN  36.0*  38.0*  25.0*   Creatinine  1.8*  1.5*  1.4*   Glucose  216*  105*  153*   Calcium  8.3  8.9  8.8   Magnesium  1.7   2.0  2.6   Phosphorus   --   4.0  4.2       Recent Labs      08/22/16   0157   AST (SGOT)  46*   ALT  32   Alkaline Phosphatase  60   Protein, Total  5.8*   Albumin  3.8       Case discussed with and patient also seen by: Dr. Leotis Shames      Signed by: Mickeal Skinner, PA-C    cc: Christene Lye, MD  Christa See, MD

## 2016-08-24 NOTE — Progress Notes (Addendum)
[facility] ([facility]) Inpatient Progress Note  ==================================================================    Date:  08/24/2016  Time: 9:38 AM      Patient Name: [facility] A  Surgeon: Christene Lye, MD    Visit Type:  Inpatient Room #   [phone]    Surgical Procedure(s): Procedure(s) (LRB):  Closure, Patent Foramen Ovalis (N/A)  Via Thoracotomy (N/A)  Excision, Mass (N/A)  TEE IN CVOR (N/A)  Post-op Day: 3 Days Post-Op    Continuous Peripheral Nerve Catheter Present?: Yes    Catheter #1  Catheter Location: Thoracic Paravertebral  Laterality: Right  Days Catheter In Situ: 3  Infusion Medication and Rate: Ropivacaine 0.2% currently infusing at 12 mL/hr      Opioid Medication(s):       Tramadol 50 mg PO x 2 doses in 24 hours.       Percocet 5 mg PO x 2 doses in 24 hours.    Adjuvant Analgesic Medication(s):       None ordered    24 Hour Vital Signs and Pain Score(s): Temp:  [97.2 F (36.2 C)-98.3 F (36.8 C)]   Heart Rate:  [64-82]   Resp Rate:  [16-18]   BP: (133-178)/(62-82)   SpO2:  [92 %-95 %]   Weight:  [70 kg (154 lb 6.4 oz)]     Last recorded pain score:  Pain Scale Used: Numeric Scale (0-10)  Pain Score: 0-No pain       Subjective:  No complaints    Objective: Pain score at rest:  0    Pain score with activity:  2                     Motor block:  No                     Sensory block:  No                     Catheter site:   Catheter site has been reinforced.    Assessment:   Catheter is functioning as expected.    Plan:       - Continue local anesthetic infusion at current rate.       - R[facility]M will follow up in morning.    Would patient receive block again?  Yes    Level of Care: 2    ------------------------------------------------------------------------------------------------------------------  Wetzel Bjornstad  Department of Anesthesiology  [facility]  [facility]    [facility] General Phone (All hours): [phone]  [facility]  Pain Nurse (8am - 4pm): [phone]

## 2016-08-24 NOTE — UM Notes (Signed)
Inpatient CSR    10/2 T 97.2, HR 82, RR 18, bp 167/79, Pox 93% RA    10/2 Na 133, BUN 366.0, Cr 1.8, WBC 17.22, glucose 216    10/2 Remains on CVSD  Cardiac tele   On-Q pump  Insulin gtt, [place]'d 10/2  Bumex 1mg  IV x1 on 10/2  Mg 1gm IV x1 on 10/2    10/2 A/P per CTS:  POD: 3  Surgery: atrial septal mass excision via right thoracotomy, PFO closure  - Acute CVA: no residual deficits, cardioembolic in etiology, Neurology following, resume Plavix when wires removed.  - Chest tubes: removed  - Chest X-ray: Minimal bibasilar atelectasis   - AKI on CKD: resolving, Cr 1.5, good UOP, checking BMP today  - Daily diuresis, Bumex 1 mg IV per nephrology  Plan:  - Has been in SR for one day, remove wires tomorrow.  - HTN- continue metoprolol  - AKI/CKD- as per Nephrol. Bumex IVP.          Lloyd Huger, BSN, RN, ACM          Utilization Review Case Manager  [facility]  ph: [phone]

## 2016-08-24 NOTE — Progress Notes (Signed)
At past 12mn pt converted to SR as noted.

## 2016-08-24 NOTE — Progress Notes (Addendum)
CV SURGERY SDU PROGRESS NOTE    POD: 3    Surgery: atrial septal mass excision via right thoracotomy, PFO closure    EF: 65%    Surgeon: Christene Lye, MD    Assessment/Plan:       Neurological/?:    Acute CVA: no residual deficits, cardioembolic in etiology, Neurology following, resume Plavix when wires removed.   Pain well controlled, discontinuing paravertebral pain pump today.   Very sleepy, sensitive to pain medication.   PT/OT: SNF    Cardiovascular:   SR 80's   HTN: [place]. Stable   Starting Plavix, now that wires are out per neurology recommendations.   Core Measures: Aspirin - Y; Beta blocker - Y; ACE - N; Statin - Y   Pacing wires: Out    Pulmonary :   Weaned to RA   Encourage OOB to chair q1h IS.   Chest tubes: removed   Chest X-ray: Minimal bibasilar atelectasis     Gastrointestinal:   Nutrition: ADAT   GI prophylaxis: Pepcid   Bowel regimen     Renal/GU:   AKI on CKD: resolving, Cr 1.5, good UOP, checking BMP today.   Daily diuresis, Bumex 1 mg IV per nephrology   Incontinence   I/O past 24 hours: innac   Foley: removed    Hematological:   Hct 11/32.4.   ASA   DVT prophylaxis: SCDs, Hep SQ    Infectious Disease:   Afebrile, WBC:18.2, reactive POD 2   Checking UA today, r/o infection   Will recheck today.   Lines: PW    Endocrinological:   DM: A1c 7.7,   Insulin GTT, transitioned to SSI today   Endocrine consulted, appreciate discharge recomendations    Plan:   Has been in SR for one day, remove wires tomorrow.   HTN- continue metoprolol   AKI/CKD- as per Nephrol. Bumex IVP.    Subjective:   Interval History: As above.    Objective:   Vital signs for last 24 hours:  Temp:  [97.2 F (36.2 C)-98.3 F (36.8 C)] 98.2 F (36.8 C)  Heart Rate:  [64-82] 68  Resp Rate:  [16-18] 18  BP: (131-178)/(67-82) 131/67    Neuro: Awake, alert  Lungs: decreased at bases  Heart: RRR, S1S2 no mrg  Abd: soft + BS  Extremities: warm, dry and well perfused. trace pedal edema  Incisions:  dressings dry        Medications:   Scheduled Meds:  Current Facility-Administered Medications   Medication Dose Route Frequency   . amiodarone  200 mg Oral Q12H SCH   . aspirin EC  325 mg Oral Daily   . atorvastatin  20 mg Oral QHS   . DULoxetine  60 mg Oral Daily   . heparin (porcine)  5,000 Units Subcutaneous Q8H SCH   . metoprolol  25 mg Oral Q12H SCH   . mupirocin   Nasal Q12H SCH   . polyethylene glycol  17 g Oral Daily   . senna-docusate  2 tablet Oral BID   . sodium chloride (PF)  3 mL Intravenous Q8H     Continuous Infusions:  . sodium chloride 10 mL/hr at 08/22/16 1000   . insulin (regular) infusion Stopped (08/24/16 0755)   . ropivacaine 12 mL/hr at 08/23/16 0953     Labs:   CBC:   Recent Labs      08/23/16   0449   WBC  18.92*   Hgb  10.2*   Hematocrit  32.4*   Platelets  196     BMP:   Recent Labs      08/23/16   0643   Sodium  137   Potassium  4.2   Chloride  105   CO2  24   BUN  38.0*   Creatinine  1.5*   Glucose  105*   Magnesium  2.0   Calcium  8.9   Phosphorus  4.0     LFTs:   Recent Labs      08/22/16   0157   AST (SGOT)  46*   ALT  32   Alkaline Phosphatase  60   Bilirubin, Total  0.5   Bilirubin, Direct  0.3   Bilirubin, Indirect  0.2   Protein, Total  5.8*   Albumin  3.8     INR:   Recent Labs      08/21/16   1409   PT  15.2*   PT INR  1.2*     ABG:   Recent Labs      08/22/16   0556   pH, Arterial  7.385   pO2, Arterial  87.7   pCO2, Arterial  34.3*   HCO3, Arterial  20.1*   Base Excess, Arterial  -3.8*     Microbiology (past 7 days):   None    Radiology (past 7 days):   Chest xray: see above      Glenda Green, ACNP  Cardiovascular Surgery

## 2016-08-24 NOTE — Progress Notes (Signed)
[facility] [employer]  Progress Note    Assessment:    -AKI due to hemodynamics and fluid status; serum creatinine 1.5 mg/dL;   -DM with CKD; probable diabetic nephropathy   -Nephrotic range proteinuria; possible due to diabetes but we'll rule out other etiologies   -Right atrial mass; plan for LHC on Monday   -CVA  -History of bilateral hydronephrosis; per urology no evidence of obstruction or hydro at this time     Plan:    -supportive care   -labs   -Bumex 1 mg IV one dose   -[place] Lasix  -continue to document UOP  -avoid nephrotoxins         Burnis Medin, MD  Office - [phone]  Spectra Link - [phone]  ++++++++++++++++++++++++++++++++++++++++++++++++++++++++++++++  Subjective:  No new complaints    Medications:  Scheduled Meds:  Current Facility-Administered Medications   Medication Dose Route Frequency   . amiodarone  200 mg Oral Q12H SCH   . aspirin EC  325 mg Oral Daily   . atorvastatin  20 mg Oral QHS   . DULoxetine  60 mg Oral Daily   . heparin (porcine)  5,000 Units Subcutaneous Q8H SCH   . metoprolol  25 mg Oral Q12H SCH   . mupirocin   Nasal Q12H SCH   . polyethylene glycol  17 g Oral Daily   . senna-docusate  2 tablet Oral BID   . sodium chloride (PF)  3 mL Intravenous Q8H     Continuous Infusions:  . sodium chloride 10 mL/hr at 08/22/16 1000   . insulin (regular) infusion 0.5 Units/hr (08/24/16 0631)   . ropivacaine 12 mL/hr at 08/23/16 0953     PRN Meds:acetaminophen, bisacodyl, dextrose, dextrose, glucagon (rDNA), insulin lispro, insulin lispro, ondansetron **OR** ondansetron, traMADol    Objective:  Vital signs in last 24 hours:  Temp:  [97.2 F (36.2 C)-98.3 F (36.8 C)] 97.2 F (36.2 C)  Heart Rate:  [64-82] 82  Resp Rate:  [16-18] 18  BP: (133-178)/(62-82) 167/79  Intake/Output last 24 hours:    Intake/Output Summary (Last 24 hours) at 08/24/16 0934  Last data filed at 08/24/16 0600   Gross per 24 hour   Intake           1220.7 ml   Output                0 ml    Net           1220.7 ml     Intake/Output this shift:  No intake/output data recorded.    Physical Exam:   Gen: WD WN NAD   CV: S1 S2 N RRR   Chest: Crackles    Ab: ND NT soft no HSM +BS   Ext: No C/E    Labs:    Recent Labs  Lab 08/23/16  0643 08/22/16  0157 08/22/16  0156 08/21/16  1409 08/21/16  0451   Glucose 105*  --  153* 161* 145*   BUN 38.0*  --  25.0* 24.0* 29.0*   Creatinine 1.5*  --  1.4* 1.3* 1.6*   Calcium 8.9  --  8.8 8.5 8.9   Sodium 137  --  141 145 141   Potassium 4.2  --  5.2* 3.8 5.2*   Chloride 105  --  111 109 112*   CO2 24  --  24 21* 20*   Albumin  --  3.8  --   --   --  Phosphorus 4.0  --  4.2  --  4.7   Magnesium 2.0  --  2.6 3.3* 1.8       Recent Labs  Lab 08/23/16  0449 08/22/16  0156 08/21/16  1409   WBC 18.92* 20.88* 15.69*   Hgb 10.2* 11.0* 10.6*   Hematocrit 32.4* 33.5* 31.8*   MCV 90.5 87.2 88.1   MCH 28.5 28.6 29.4   MCHC 31.5* 32.8 33.3   RDW 16* 16* 15   MPV 11.6 10.3 10.2   Platelets 196 210 184

## 2016-08-24 NOTE — Progress Notes (Signed)
WOCN Note    Follow up for LUE skin tear.    Assessment: Site is clean, pink base, needs to epithelialize. Epidermis is thin.    Recommendation: Apply Hydraguard to skin tear PRN, pt may do this independently. OK to use on rest of skin also for hydration. No need to use foam dressing, more likely to pull off epidermis than protect area.    WOCN signing off on this patient at this time.  Please reconsult for any further WOCN needs.    Sherri Sear RN CCRN SCRN [facility]  [facility] WOCN Team  M T Th F 7am-5:30pm  [place] [phone]  Team Pager [phone]

## 2016-08-24 NOTE — Plan of Care (Signed)
Problem: Safety  Goal: Patient will be free from injury during hospitalization  Outcome: Progressing      Problem: Pain  Goal: Pain at adequate level as identified by patient  Outcome: Progressing  Patient was very disoriented to p[lace and time on initial rounds. She also was very sleepy. A fast assessment on gross neurologic status shows that patient was more sedated that normal. She could answer questions properly, and knew were she was and the procedure she had. PA was notified as well as nurse responsible for Nerve Block.Nerve block was discontinue after patient was getting more sleepy, and not able to eat or to keep eyes open. Once nerve block was discontinued patient slowly on the following hours started to wake up and now is fully oriented and able to feed herself and visit with her family. She denies pain or discomfort at this time.  Patient will be free from injury and falls , because she is more able to transfer from bed to chair with assist of a walker. And moderate to minimal assisty.

## 2016-08-24 NOTE — OT Eval Note (Signed)
[facility]   Occupational Therapy Reassessment    Patient: Glenda Green    MRN#: [id number]   [facility]  Bed: FI259/FI259-01                                   Discharge Recommendations:   Discharge Recommendation: SNF   DME Recommended for Discharge: Other (Comment) (TBD at rehab)    If SNF recommended discharge disposition is not available, patient will need mod assist for ADLs and OOB activities, WC, BSC, RW equipment, and HHOT.       Assessment:   Glenda Green is a 80 y.o. female admitted 08/08/2016 is s/p atrial septal mass excision via R thoracotomy with new OT orders in chart 08/23/16. Pt presents with decreased alertness (falling asleep during TE), decreased balance and overall strength limiting independence with ADLs and transfers. Pt will benefit from cont OT to max I and safety with ADLs and functional transfers.    Therapy Diagnosis: decreased ADLs    Rehabilitation Potential: good    Treatment Activities: OT re-eval, transfers, balance, TE/HEP  Educated the patient to role of occupational therapy, plan of care, goals of therapy and HEP, safety with mobility and ADLs.    Plan:    OT Frequency Recommended: 2-3x/wk     Treatment/Interventions: ADL training, functional transfer training, strengthening, safety training, pt education, there ex/ther act, energy conservation tech training, balance training    Risks/benefits/POC discussed w/pt.       Reason for reassessment:  Pt is s/p the following procedure:  Date Time: 08/21/16 12:47 PM  Operative Procedure:   Resection cardiac mass  Closure PFO  TEE    Precautions and Contraindications:   Falls   L MCA CVA on admission    Consult received for Glenda Green for OT Evaluation and Treatment.  Patient's medical condition is appropriate for Occupational Therapy intervention at this time.      History of Present Illness:   Updated History of Present Illness: Glenda Green is a 80 y.o. female admitted on  08/08/2016 with CVA symptoms found to have an acute L MCA CVA, she underwent a TEE and  was noted to have a large right atrial mass (5.5cm width x 3.5cm height).  On the day she presented to the hospital she was found to be  aphasic by her family. Imaging revealed acute infarct, she was out  of the window for TPA. Per chart.      Admitting Diagnosis: Cerebrovascular accident (CVA), unspecified mechanism [I63.9]  CVA (cerebral vascular accident) [I63.9]  Cerebral infarction, unspecified [I63.9]    Updated imaging, tests, labs:   NM Kidney W Flow And Function Glenda Green [[id number]: 08/18/16 1358  Impression:     1. Progressive clearance by right kidney with persistent tracer  accumulation in renal pelvis with prolonged Lasix half clearance time in  the obstructive range. No complete obstruction. Patulous collecting  system can blunt Lasix response.  2. No scintigraphic evidence of urinary obstruction on the left.  3. Split renal function left 54% and right 46%.    X-ray chest AP portable (daily) [[id number]: 08/23/16 0818  Impression:      1. No change in lines and tubes.  2. Minimal basilar atelectasis. Negative for pneumothorax.    Date of Cardiac Cath: 08/17/2016     Operative Procedure:   LHC, CORS and grafts  Subjective:   Patient is agreeable to participation in the therapy session. Nursing clears patient for therapy.     Patient Goal: "I'm so sleepy"    Pain:   Scale: 5/10  Location: abd  Intervention: pain meds per schedule; positioned in comfort; RN aware    Objective:   Patient is seated in a bedside chair with IV, tele, On Q-pump in place.    Cognitive Status and Neuro Exam:  Sleepy and difficulty staying awake during exercises, oriented x 3, vc's to follow commands; decreased safety    Musculoskeletal Examination  RUE ROM: shoulder flexion 45 degrees 2/2 pain otherwise WFL  LUE ROM: WFL    RUE Strength: grip 4-/5  LUE Strength: 4/5  Pt educated and completed BUE and LE HEP while in  chair w/vc's t/o 2/2 pt falling asleep easily.     Sensory/Oculomotor Examination  Auditory: WFL  Tactile: LT intact   Vision: tracking WFL; able to read signs in room      Activities of Daily Living  Eating: set up; able to bring hand to mouth  Grooming: min A, seated in chair  Bathing: NT  UE Dressing: gown w/min A  LE Dressing: mod a  Toileting: deferred by pt    Functional Mobility:  Rolling: pt OOB  Sit to Stand: min A  Stand to Sit: mod A and vc's for hand placement  Transfers: mod A w/RW; posteriorly LOB  Pt required vc's for safety w/balance and use of RW     Balance  Static Sitting: good   Dynamic Sitting: CG A  Static Standing: mod A w/RW; LOB posteriorly   Dynamic Standing: mod A w/RW; vc's for safety    Participation and Activity Tolerance  Participation Effort: good- w/vc's to stay awake  Endurance: fair    Patient left with call bell within reach, all needs met, SCDs on, fall mat in place, chair alarm on and all questions answered. RN notified of session outcome and patient response.         Goals:  Time For Goal Achievement: 5 visits  ADL Goals  Patient will groom self: at sinkside, Contact Guard Assist  Patient will dress upper body: Stand by Assist  Patient will dress lower body: Contact Guard Assist  Patient will toilet: Contact Guard Assist  Mobility and Transfer Goals  Pt will perform functional transfers: Contact Guard Assist, with rolling walker  Neuro Re-Ed Goals  Pt will perform dynamic sitting balance: Supervision  Pt will perform dynamic standing balance: Contact Guard Assist  Musculoskeletal Goals  Pt will demonstrate increase of strength: to increase ability to complete ADLs (of B UE by 1/2 grade t/o)                    Time of treatment:   OT Received On: 08/24/16  Start Time: 1145  Stop Time: 1220  Time Calculation (min): 35 min    Signature: Felecia Shelling MS, OTR [phone]

## 2016-08-25 ENCOUNTER — Inpatient Hospital Stay: Payer: Medicare Other | Admitting: Physical Medicine & Rehabilitation

## 2016-08-25 ENCOUNTER — Inpatient Hospital Stay
Admission: RE | Admit: 2016-08-25 | Discharge: 2016-09-04 | DRG: 057 | Disposition: A | Discharge status: Home Health | Payer: Medicare Other | Source: Other Acute Inpatient Hospital | Attending: Physical Medicine & Rehabilitation | Admitting: Physical Medicine & Rehabilitation

## 2016-08-25 DIAGNOSIS — E875 Hyperkalemia: Secondary | ICD-10-CM | POA: Diagnosis not present

## 2016-08-25 DIAGNOSIS — I129 Hypertensive chronic kidney disease with stage 1 through stage 4 chronic kidney disease, or unspecified chronic kidney disease: Secondary | ICD-10-CM | POA: Diagnosis present

## 2016-08-25 DIAGNOSIS — Z951 Presence of aortocoronary bypass graft: Secondary | ICD-10-CM

## 2016-08-25 DIAGNOSIS — Z794 Long term (current) use of insulin: Secondary | ICD-10-CM

## 2016-08-25 DIAGNOSIS — I2510 Atherosclerotic heart disease of native coronary artery without angina pectoris: Secondary | ICD-10-CM | POA: Diagnosis present

## 2016-08-25 DIAGNOSIS — D151 Benign neoplasm of heart: Secondary | ICD-10-CM | POA: Diagnosis present

## 2016-08-25 DIAGNOSIS — B9620 Unspecified Escherichia coli [E. coli] as the cause of diseases classified elsewhere: Secondary | ICD-10-CM | POA: Diagnosis present

## 2016-08-25 DIAGNOSIS — M1990 Unspecified osteoarthritis, unspecified site: Secondary | ICD-10-CM | POA: Diagnosis present

## 2016-08-25 DIAGNOSIS — N1330 Unspecified hydronephrosis: Secondary | ICD-10-CM | POA: Diagnosis present

## 2016-08-25 DIAGNOSIS — I639 Cerebral infarction, unspecified: Secondary | ICD-10-CM | POA: Diagnosis present

## 2016-08-25 DIAGNOSIS — E1122 Type 2 diabetes mellitus with diabetic chronic kidney disease: Secondary | ICD-10-CM | POA: Diagnosis present

## 2016-08-25 DIAGNOSIS — I69351 Hemiplegia and hemiparesis following cerebral infarction affecting right dominant side: Principal | ICD-10-CM

## 2016-08-25 DIAGNOSIS — E785 Hyperlipidemia, unspecified: Secondary | ICD-10-CM | POA: Diagnosis present

## 2016-08-25 DIAGNOSIS — E1165 Type 2 diabetes mellitus with hyperglycemia: Secondary | ICD-10-CM | POA: Diagnosis present

## 2016-08-25 DIAGNOSIS — N139 Obstructive and reflux uropathy, unspecified: Secondary | ICD-10-CM | POA: Diagnosis present

## 2016-08-25 DIAGNOSIS — Z7982 Long term (current) use of aspirin: Secondary | ICD-10-CM

## 2016-08-25 DIAGNOSIS — I63512 Cerebral infarction due to unspecified occlusion or stenosis of left middle cerebral artery: Secondary | ICD-10-CM | POA: Diagnosis present

## 2016-08-25 DIAGNOSIS — N179 Acute kidney failure, unspecified: Secondary | ICD-10-CM | POA: Diagnosis present

## 2016-08-25 DIAGNOSIS — Z96652 Presence of left artificial knee joint: Secondary | ICD-10-CM | POA: Diagnosis present

## 2016-08-25 DIAGNOSIS — N390 Urinary tract infection, site not specified: Secondary | ICD-10-CM | POA: Diagnosis present

## 2016-08-25 DIAGNOSIS — E1121 Type 2 diabetes mellitus with diabetic nephropathy: Secondary | ICD-10-CM | POA: Diagnosis present

## 2016-08-25 DIAGNOSIS — Z89432 Acquired absence of left foot: Secondary | ICD-10-CM

## 2016-08-25 DIAGNOSIS — D472 Monoclonal gammopathy: Secondary | ICD-10-CM | POA: Diagnosis present

## 2016-08-25 DIAGNOSIS — N183 Chronic kidney disease, stage 3 (moderate): Secondary | ICD-10-CM | POA: Diagnosis present

## 2016-08-25 LAB — CBC
Absolute NRBC: 0 10*3/uL
Hematocrit: 32.2 % — ABNORMAL LOW (ref 37.0–47.0)
Hgb: 10.3 g/dL — ABNORMAL LOW (ref 12.0–16.0)
MCH: 28.2 pg (ref 28.0–32.0)
MCHC: 32 g/dL (ref 32.0–36.0)
MCV: 88.2 fL (ref 80.0–100.0)
MPV: 10.8 fL (ref 9.4–12.3)
Nucleated RBC: 0 /100 WBC (ref 0.0–1.0)
Platelets: 192 10*3/uL (ref 140–400)
RBC: 3.65 10*6/uL — ABNORMAL LOW (ref 4.20–5.40)
RDW: 15 % (ref 12–15)
WBC: 12.88 10*3/uL — ABNORMAL HIGH (ref 3.50–10.80)

## 2016-08-25 LAB — BASIC METABOLIC PANEL
BUN: 35 mg/dL — ABNORMAL HIGH (ref 7.0–19.0)
CO2: 26 mEq/L (ref 22–29)
Calcium: 8.4 mg/dL (ref 7.9–10.2)
Chloride: 101 mEq/L (ref 100–111)
Creatinine: 1.7 mg/dL — ABNORMAL HIGH (ref 0.6–1.0)
Glucose: 135 mg/dL — ABNORMAL HIGH (ref 70–100)
Potassium: 3.8 mEq/L (ref 3.5–5.1)
Sodium: 135 mEq/L — ABNORMAL LOW (ref 136–145)

## 2016-08-25 LAB — URINALYSIS WITH MICROSCOPIC
Bilirubin, UA: NEGATIVE
Glucose, UA: 150 — AB
Ketones UA: NEGATIVE
Nitrite, UA: NEGATIVE
Protein, UR: 100 — AB
Specific Gravity UA: 1.011 (ref 1.001–1.035)
Urine pH: 6 (ref 5.0–8.0)
Urobilinogen, UA: NEGATIVE mg/dL

## 2016-08-25 LAB — GLUCOSE WHOLE BLOOD - POCT
Whole Blood Glucose POCT: 138 mg/dL — ABNORMAL HIGH (ref 70–100)
Whole Blood Glucose POCT: 350 mg/dL — ABNORMAL HIGH (ref 70–100)
Whole Blood Glucose POCT: 203 mg/dL — ABNORMAL HIGH (ref 70–100)
Whole Blood Glucose POCT: 300 mg/dL — ABNORMAL HIGH (ref 70–100)

## 2016-08-25 LAB — GFR: EGFR: 29

## 2016-08-25 LAB — MAGNESIUM: Magnesium: 1.9 mg/dL (ref 1.6–2.6)

## 2016-08-25 MED ORDER — GLUCAGON 1 MG IJ SOLR (WRAP)
1.0000 mg | INTRAMUSCULAR | Status: DC | PRN
Start: 2016-08-25 — End: 2016-09-04

## 2016-08-25 MED ORDER — MUPIROCIN CALCIUM 2 % NA OINT
TOPICAL_OINTMENT | Freq: Two times a day (BID) | NASAL | Status: AC
Start: 2016-08-25 — End: 2016-08-30
  Administered 2016-08-25 – 2016-08-30 (×10): 0.9 via NASAL
  Filled 2016-08-25 (×12): qty 1

## 2016-08-25 MED ORDER — AMLODIPINE BESYLATE 2.5 MG PO TABS
2.5000 mg | ORAL_TABLET | Freq: Every day | ORAL | Status: DC
Start: 2016-08-25 — End: 2016-08-25
  Administered 2016-08-25: 2.5 mg via ORAL
  Filled 2016-08-25: qty 1

## 2016-08-25 MED ORDER — ACETAMINOPHEN 325 MG PO TABS
650.0000 mg | ORAL_TABLET | ORAL | Status: DC | PRN
Start: 2016-08-25 — End: 2016-09-04
  Administered 2016-08-28 – 2016-08-31 (×3): 650 mg via ORAL
  Filled 2016-08-25 (×3): qty 2

## 2016-08-25 MED ORDER — SULFAMETHOXAZOLE-TRIMETHOPRIM 800-160 MG PO TABS
1.0000 | ORAL_TABLET | Freq: Two times a day (BID) | ORAL | Status: DC
Start: 2016-08-25 — End: 2016-08-25

## 2016-08-25 MED ORDER — TRAMADOL HCL 50 MG PO TABS
50.0000 mg | ORAL_TABLET | Freq: Four times a day (QID) | ORAL | Status: DC | PRN
Start: 2016-08-25 — End: 2016-09-04
  Administered 2016-08-27 – 2016-09-01 (×3): 50 mg via ORAL
  Filled 2016-08-25 (×3): qty 1

## 2016-08-25 MED ORDER — GLUCOSE 40 % PO GEL
15.0000 g | ORAL | Status: DC | PRN
Start: 2016-08-25 — End: 2016-09-04

## 2016-08-25 MED ORDER — DOCUSATE SODIUM 100 MG PO CAPS
100.0000 mg | ORAL_CAPSULE | Freq: Two times a day (BID) | ORAL | Status: DC
Start: 2016-08-25 — End: 2016-09-04
  Administered 2016-08-25 – 2016-09-04 (×11): 100 mg via ORAL
  Filled 2016-08-25 (×16): qty 1

## 2016-08-25 MED ORDER — MAGNESIUM SULFATE IN D5W 1-5 GM/100ML-% IV SOLN
1.0000 g | Freq: Once | INTRAVENOUS | Status: AC
Start: 2016-08-25 — End: 2016-08-25
  Administered 2016-08-25: 1 g via INTRAVENOUS
  Filled 2016-08-25: qty 100

## 2016-08-25 MED ORDER — INSULIN REGULAR HUMAN 100 UNIT/ML IJ SOLN
1.0000 [IU] | Freq: Three times a day (TID) | INTRAMUSCULAR | Status: DC | PRN
Start: 2016-08-25 — End: 2016-09-04
  Administered 2016-08-26 (×2): 7 [IU] via SUBCUTANEOUS
  Administered 2016-08-27: 3 [IU] via SUBCUTANEOUS
  Administered 2016-08-27 – 2016-08-28 (×2): 5 [IU] via SUBCUTANEOUS
  Administered 2016-08-28: 3 [IU] via SUBCUTANEOUS
  Administered 2016-08-29: 5 [IU] via SUBCUTANEOUS
  Administered 2016-08-29 – 2016-08-30 (×2): 3 [IU] via SUBCUTANEOUS
  Administered 2016-08-30: 7 [IU] via SUBCUTANEOUS
  Administered 2016-08-31 (×2): 3 [IU] via SUBCUTANEOUS
  Administered 2016-09-01: 8 [IU] via SUBCUTANEOUS
  Administered 2016-09-01: 3 [IU] via SUBCUTANEOUS
  Administered 2016-09-01: 1 [IU] via SUBCUTANEOUS
  Administered 2016-09-02 – 2016-09-03 (×4): 3 [IU] via SUBCUTANEOUS
  Filled 2016-08-25: qty 21
  Filled 2016-08-25 (×4): qty 9
  Filled 2016-08-25: qty 24
  Filled 2016-08-25: qty 21
  Filled 2016-08-25: qty 9
  Filled 2016-08-25: qty 12
  Filled 2016-08-25: qty 9
  Filled 2016-08-25: qty 3
  Filled 2016-08-25: qty 21
  Filled 2016-08-25: qty 15
  Filled 2016-08-25: qty 9
  Filled 2016-08-25 (×2): qty 15
  Filled 2016-08-25 (×3): qty 9

## 2016-08-25 MED ORDER — CLOPIDOGREL BISULFATE 75 MG PO TABS
75.0000 mg | ORAL_TABLET | Freq: Every day | ORAL | Status: DC
Start: 2016-08-26 — End: 2016-09-04
  Administered 2016-08-26 – 2016-09-04 (×10): 75 mg via ORAL
  Filled 2016-08-25 (×10): qty 1

## 2016-08-25 MED ORDER — METOPROLOL TARTRATE 25 MG PO TABS
25.0000 mg | ORAL_TABLET | Freq: Two times a day (BID) | ORAL | Status: DC
Start: 2016-08-25 — End: 2016-09-03

## 2016-08-25 MED ORDER — BISACODYL 10 MG RE SUPP
10.0000 mg | Freq: Every day | RECTAL | Status: DC | PRN
Start: 2016-08-25 — End: 2016-08-26

## 2016-08-25 MED ORDER — ASPIRIN 81 MG PO TBEC
81.0000 mg | DELAYED_RELEASE_TABLET | Freq: Every day | ORAL | Status: DC
Start: 2016-08-26 — End: 2016-09-04
  Administered 2016-08-26 – 2016-09-04 (×10): 81 mg via ORAL
  Filled 2016-08-25 (×10): qty 1

## 2016-08-25 MED ORDER — HEPARIN SODIUM (PORCINE) 5000 UNIT/ML IJ SOLN
5000.0000 [IU] | Freq: Two times a day (BID) | INTRAMUSCULAR | Status: DC
Start: 2016-08-25 — End: 2016-09-04
  Administered 2016-08-25 – 2016-09-04 (×20): 5000 [IU] via SUBCUTANEOUS
  Filled 2016-08-25 (×20): qty 1

## 2016-08-25 MED ORDER — INSULIN GLARGINE 100 UNIT/ML SC SOLN
8.0000 [IU] | Freq: Every evening | SUBCUTANEOUS | Status: DC
Start: 2016-08-25 — End: 2016-08-27
  Administered 2016-08-25 – 2016-08-26 (×2): 8 [IU] via SUBCUTANEOUS
  Filled 2016-08-25 (×2): qty 8

## 2016-08-25 MED ORDER — ONDANSETRON 4 MG PO TBDP
4.0000 mg | ORAL_TABLET | Freq: Four times a day (QID) | ORAL | Status: DC | PRN
Start: 2016-08-25 — End: 2016-09-04

## 2016-08-25 MED ORDER — ATORVASTATIN CALCIUM 10 MG PO TABS
20.0000 mg | ORAL_TABLET | Freq: Every evening | ORAL | Status: DC
Start: 2016-08-25 — End: 2016-09-04
  Administered 2016-08-25 – 2016-09-03 (×10): 20 mg via ORAL
  Filled 2016-08-25 (×10): qty 2

## 2016-08-25 MED ORDER — BENZOCAINE-MENTHOL 15-3.6 MG MT LOZG
1.0000 | LOZENGE | OROMUCOSAL | Status: DC | PRN
Start: 2016-08-25 — End: 2016-09-03

## 2016-08-25 MED ORDER — AMIODARONE HCL 200 MG PO TABS
ORAL_TABLET | ORAL | 0 refills | Status: DC
Start: 2016-08-25 — End: 2016-09-03

## 2016-08-25 MED ORDER — INSULIN LISPRO 100 UNIT/ML SC SOLN
1.0000 [IU] | Freq: Three times a day (TID) | SUBCUTANEOUS | 0 refills | Status: DC | PRN
Start: 2016-08-25 — End: 2016-09-03

## 2016-08-25 MED ORDER — POLYETHYLENE GLYCOL 3350 17 G PO PACK
17.0000 g | PACK | Freq: Every day | ORAL | Status: DC
Start: 2016-08-25 — End: 2016-08-26
  Administered 2016-08-25 – 2016-08-26 (×2): 17 g via ORAL
  Filled 2016-08-25 (×2): qty 1

## 2016-08-25 MED ORDER — SENNOSIDES-DOCUSATE SODIUM 8.6-50 MG PO TABS
2.0000 | ORAL_TABLET | Freq: Two times a day (BID) | ORAL | Status: DC
Start: 2016-08-25 — End: 2016-09-03

## 2016-08-25 MED ORDER — CLOPIDOGREL BISULFATE 75 MG PO TABS
75.0000 mg | ORAL_TABLET | Freq: Every day | ORAL | Status: DC
Start: 2016-08-26 — End: 2016-09-03

## 2016-08-25 MED ORDER — ACETAMINOPHEN 325 MG PO TABS
650.0000 mg | ORAL_TABLET | Freq: Four times a day (QID) | ORAL | Status: DC | PRN
Start: 2016-08-25 — End: 2016-09-03

## 2016-08-25 MED ORDER — DULOXETINE HCL 30 MG PO CPEP
60.0000 mg | ORAL_CAPSULE | Freq: Every day | ORAL | Status: DC
Start: 2016-08-26 — End: 2016-09-04
  Administered 2016-08-26 – 2016-09-04 (×10): 60 mg via ORAL
  Filled 2016-08-25 (×10): qty 2

## 2016-08-25 MED ORDER — ATORVASTATIN CALCIUM 20 MG PO TABS
20.0000 mg | ORAL_TABLET | Freq: Every evening | ORAL | Status: DC
Start: 2016-08-25 — End: 2016-09-03

## 2016-08-25 MED ORDER — INSULIN GLARGINE 100 UNIT/ML SC SOLN
8.0000 [IU] | Freq: Every evening | SUBCUTANEOUS | 0 refills | Status: DC
Start: 2016-08-25 — End: 2016-09-03

## 2016-08-25 MED ORDER — BENZOCAINE-MENTHOL 15-3.6 MG MT LOZG
1.0000 | LOZENGE | OROMUCOSAL | Status: DC | PRN
Start: 2016-08-25 — End: 2016-09-04

## 2016-08-25 MED ORDER — AMIODARONE HCL 200 MG PO TABS
200.0000 mg | ORAL_TABLET | Freq: Two times a day (BID) | ORAL | Status: DC
Start: 2016-08-25 — End: 2016-09-04
  Administered 2016-08-25 – 2016-09-04 (×20): 200 mg via ORAL
  Filled 2016-08-25 (×20): qty 1

## 2016-08-25 MED ORDER — INSULIN LISPRO 100 UNIT/ML SC SOLN
4.0000 [IU] | Freq: Three times a day (TID) | SUBCUTANEOUS | 0 refills | Status: DC
Start: 2016-08-25 — End: 2016-09-03

## 2016-08-25 MED ORDER — INSULIN LISPRO 100 UNIT/ML SC SOLN
4.0000 [IU] | Freq: Three times a day (TID) | SUBCUTANEOUS | Status: DC
Start: 2016-08-25 — End: 2016-08-25

## 2016-08-25 MED ORDER — AMLODIPINE BESYLATE 2.5 MG PO TABS
2.5000 mg | ORAL_TABLET | Freq: Every day | ORAL | Status: DC
Start: 2016-08-26 — End: 2016-09-03

## 2016-08-25 MED ORDER — INSULIN LISPRO 100 UNIT/ML SC SOLN
1.0000 [IU] | Freq: Every evening | SUBCUTANEOUS | 0 refills | Status: DC | PRN
Start: 2016-08-25 — End: 2016-09-03

## 2016-08-25 MED ORDER — DEXTROSE 10 % IV BOLUS
125.0000 mL | INTRAVENOUS | Status: DC | PRN
Start: 2016-08-25 — End: 2016-09-04

## 2016-08-25 MED ORDER — SENNA 8.6 MG PO TABS
17.2000 mg | ORAL_TABLET | Freq: Every evening | ORAL | Status: DC
Start: 2016-08-25 — End: 2016-08-26
  Administered 2016-08-25: 17.2 mg via ORAL
  Filled 2016-08-25: qty 2

## 2016-08-25 MED ORDER — PHENYLEPH-SHARK LIV OIL-MO-PET 0.25-3-14-71.9 % RE OINT
TOPICAL_OINTMENT | Freq: Two times a day (BID) | RECTAL | Status: DC | PRN
Start: 2016-08-25 — End: 2016-08-25
  Filled 2016-08-25: qty 28

## 2016-08-25 MED ORDER — POTASSIUM CHLORIDE CRYS ER 20 MEQ PO TBCR
20.0000 meq | EXTENDED_RELEASE_TABLET | Freq: Once | ORAL | Status: AC
Start: 2016-08-25 — End: 2016-08-25
  Administered 2016-08-25: 20 meq via ORAL
  Filled 2016-08-25: qty 1

## 2016-08-25 MED ORDER — AMLODIPINE BESYLATE 5 MG PO TABS
2.5000 mg | ORAL_TABLET | Freq: Every day | ORAL | Status: DC
Start: 2016-08-26 — End: 2016-09-04
  Administered 2016-08-26 – 2016-09-04 (×10): 2.5 mg via ORAL
  Filled 2016-08-25 (×10): qty 1

## 2016-08-25 MED ORDER — INSULIN REGULAR HUMAN 100 UNIT/ML IJ SOLN
1.0000 [IU] | Freq: Every evening | INTRAMUSCULAR | Status: DC | PRN
Start: 2016-08-25 — End: 2016-09-04
  Administered 2016-08-25: 3 [IU] via SUBCUTANEOUS
  Administered 2016-08-27: 4 [IU] via SUBCUTANEOUS
  Administered 2016-08-28: 3 [IU] via SUBCUTANEOUS
  Administered 2016-08-29: 1 [IU] via SUBCUTANEOUS
  Administered 2016-08-30: 3 [IU] via SUBCUTANEOUS
  Administered 2016-08-31: 4 [IU] via SUBCUTANEOUS
  Administered 2016-09-01: 2 [IU] via SUBCUTANEOUS
  Administered 2016-09-02: 4 [IU] via SUBCUTANEOUS
  Filled 2016-08-25: qty 9
  Filled 2016-08-25: qty 6
  Filled 2016-08-25 (×3): qty 12
  Filled 2016-08-25: qty 3
  Filled 2016-08-25 (×2): qty 9

## 2016-08-25 MED ORDER — METOPROLOL TARTRATE 25 MG PO TABS
25.0000 mg | ORAL_TABLET | Freq: Two times a day (BID) | ORAL | Status: DC
Start: 2016-08-25 — End: 2016-09-04
  Administered 2016-08-25 – 2016-09-04 (×19): 25 mg via ORAL
  Filled 2016-08-25 (×20): qty 1

## 2016-08-25 MED ORDER — POLYETHYLENE GLYCOL 3350 17 G PO PACK
17.0000 g | PACK | Freq: Every day | ORAL | Status: DC
Start: 2016-08-26 — End: 2016-09-03

## 2016-08-25 NOTE — Discharge Instr - AVS First Page (Addendum)
Reason for your Hospital Admission:  You were admitted to the hospital for treatment and evaluation of CVA, also known as a stroke. Incidentally during diagnostic work up an abnormal mass was discovered in the septum of your heart. You underwent surgical removal and biopsy of the this mass and had surgical closure of the wall separating the atrial chambers of the heart known as a PFO closure.      Instructions for after your discharge:  See discharge instructions    SEE ACCOMPANYING PURPLE FORM IN YOUR DISCHARGE PACKET   ( [facility] CARDIAC SURGERY DISCHARGE INSTRUCTIONS)    MY CARDIAC SURGERY        The operation I had was : Septal mass resection, biopsy and PFO closure.        My Incisions are located: Sternum        I needed this operation to: To prevent medical complications such as stroke, heart failure and to biopsy mass to test for cancer.        My cardiac surgeon is: Clarene Duke, MD        My ejection fraction is: 65% (a measure of how well my heart pumps - normal is 55-60%)          ===================================================================     ENDOCRINOLOGY HOSPITAL DISCHARGE RECOMMENDATIONS  ATT REHAB: Please resume insulin regimen - lantus and humalog qac with SSI qac and qhs PRN and adjust dose as needed. Please provide the following insulin dose instructions below for patient when discharge home and be sure patient has follow up with PMD on discharge.     Insulin Instructions Handout     Check your blood sugar Your target BG numbers     [x]      Fasting/before breakfast   90-150     []     Before meals        []     ___ hours after eating        []     Bedtime          insulin Rx YOUR doctor has prescribed for you:        Time of Day     Take shot Doses of insulin to take     Rapid/short acting   insulin as      [] Aspart = Novalog   [] Lispro = Humalog   [] Regular Intermediate/long acting   insulin as     []  NPH  []  Glargine = Lantus or Basaglar  []  Detemir = Levemir  Pre-mixed   insulin  as    []  70/30  []  75/25                                 []  Breakfast       []    With meal      []   ___ minutes           before eating                         units                               units                  units    []   Lunch  units                               units     []  Dinner                            units                               units               units    []   Bedtime                                  units      SHORT ACTING INSULIN:     Watch for symptoms of low sugar (shakiness, weakness, confusion, lightheadedness, or sweats) If it is low (<70), have a small snack such as 4 oz of orange juice, 1 cup of milk, 4-5 pieces of hard candy or 3-4 glucose tablets. Recheck sugar in 15 minutes. If not improved, can repeat as needed.   If your blood glucose is elevated before breakfast, lunch and dinner you many require additional correction with Apidra, Humalog or Novolog. (see below)        *Contact your Physician if blood glucose stays above 350.     Example Insulin Combinations:  (1) Before breakfast your BGI is 220. You would take your  units +   additional units =   units.        Basal Insulins  Basal insulin provides a constant background level of insulin in the bloodstream throughout the day and night which controls pre-meal and overnight blood glucose levels.      Your basal insulin is Lantus, Basaglar or Levemir or NPH.  Basal insulins are generally taken by most users before breakfast and/or at    Instructions for Basal insulin:      . For basal insulin, start at 8 units, given every night.    . Test your fasting blood sugar every morning.  Target morning blood sugar is 80-150.    . If your fasting blood sugar is above 150 for 2 days in a row, increase the TOTAL basal insulin dose by 2 units.     . Continue to increase the basal insulin every 2 days until your fasting blood sugar comes into target range.       . Decrease your daily basal insulin dose by 4  units if hypoglycemia/low blood sugar (less than 90) occurs during the night or in the morning, without any explanation, e.g. you ate less or exercised more during the day or evening.    [facility] Endocrinology Team    ==================================================================

## 2016-08-25 NOTE — Progress Notes (Addendum)
CV SURGERY SDU PROGRESS NOTE    POD: 4    Surgery: atrial septal mass excision via right thoracotomy, PFO closure    EF: 65%    Surgeon: Christene Lye, MD    Assessment/Plan:       Neurological/?:    Acute CVA: no residual deficits, cardioembolic in etiology, Neurology following, resume Plavix when wires removed.   Pain well controlled, discontinuing paravertebral pain pump today.   More alert today since Qpump  Removed, did not require pain medication overnight.   PT/OT: SNF    Cardiovascular:   SR 80's   HTN: Metoprolol, adding norvasc today was hypertensive overnight.   Starting Plavix, now that wires are out per neurology recommendations.   Core Measures: Aspirin - Y; Beta blocker - Y; ACE - N; Statin - Y   Pacing wires: Out    Pulmonary :   Weaned to RA   Encourage OOB to chair q1h IS.   Chest tubes: removed   Chest X-ray: Minimal bibasilar atelectasis     Gastrointestinal:   Nutrition: ADAT   GI prophylaxis: Pepcid   Bowel regimen     Renal/GU:   AKI on CKD: resolving, Cr 1.7,    No diuresis today, Nephrology consulted and has made recomendations   Incontinence yesterday no reports overnight.   I/O past 24 hours: innac   Foley: removed    Hematological:   Hct 10/32   ASA   DVT prophylaxis: SCDs, Hep SQ    Infectious Disease:   Afebrile, WBC:12 down trending.   UA positive from 9/29 pending reflex   Acute Cystitis: S/Sx improving without treatment, will hold off on Rx for now in the setting of symptom improvement and impaired renal function.   Lines: none    Endocrinological:   DM: A1c 7.7,   transitioned to SSI today   Endocrine consulted, appreciate discharge recomendations    Plan:   HTN- continue metoprolol, norvasc   AKI/CKD- As per nephrology, appreciate recomendations.   Discharge to acute rehab today    Subjective:   Interval History: As above.    Objective:   Vital signs for last 24 hours:  Temp:  [98.2 F (36.8 C)-98.6 F (37 C)] 98.4 F (36.9 C)  Heart Rate:   [60-75] 75  Resp Rate:  [16-18] 16  BP: (131-158)/(65-81) 144/81    Neuro: Awake, alert  Lungs: decreased at bases  Heart: RRR, S1S2 no mrg  Abd: soft + BS  Extremities: warm, dry and well perfused. trace pedal edema  Incisions: dressings dry        Medications:   Scheduled Meds:  Current Facility-Administered Medications   Medication Dose Route Frequency   . amiodarone  200 mg Oral Q12H SCH   . amLODIPine  2.5 mg Oral Daily   . aspirin EC  81 mg Oral Daily   . atorvastatin  20 mg Oral QHS   . clopidogrel  75 mg Oral Daily   . DULoxetine  60 mg Oral Daily   . heparin (porcine)  5,000 Units Subcutaneous Q8H SCH   . insulin glargine  8 Units Subcutaneous QHS   . metoprolol  25 mg Oral Q12H SCH   . mupirocin   Nasal Q12H SCH   . polyethylene glycol  17 g Oral Daily   . senna-docusate  2 tablet Oral BID   . sodium chloride (PF)  3 mL Intravenous Q8H     Labs:   CBC:   Recent Labs  08/25/16   0356   WBC  12.88*   Hgb  10.3*   Hematocrit  32.2*   Platelets  192     BMP:   Recent Labs      08/25/16   0356   08/23/16   0643   Sodium  135*   < >  137   Potassium  3.8   < >  4.2   Chloride  101   < >  105   CO2  26   < >  24   BUN  35.0*   < >  38.0*   Creatinine  1.7*   < >  1.5*   Glucose  135*   < >  105*   Magnesium  1.9   < >  2.0   Calcium  8.4   < >  8.9   Phosphorus   --    --   4.0    < > = values in this interval not displayed.     LFTs:   No results for input(s): AST, ALT, ALKPHOS, BILITOTAL, BILIDIRECT, BILIINDIRECT, PROT, ALB in the last 72 hours.  INR:   No results for input(s): PT, INR, APTT in the last 72 hours.  ABG:   No results for input(s): PHART, PO2ART, PCO2ART, HCO3ART, BEART in the last 72 hours.  Microbiology (past 7 days):   None    Radiology (past 7 days):   Chest xray: see above      Sullivan Lone, ACNP  Cardiovascular Surgery

## 2016-08-25 NOTE — Discharge Summary (Signed)
Discharge Summary    Date:08/25/2016   Patient Name: Glenda Green  Attending Physician: Christene Lye, MD    Date of Admission:   08/08/2016    Date of Discharge:   08/25/2016    Admitting Diagnosis:   CVA, Cardiac Mass resection    Discharge Dx:     Principal Diagnosis (Diagnosis after study, that is chiefly responsible for admission to inpatient status):   Myxoma     Treatment Team:   Treatment Team:   Attending Provider: Christene Lye, MD     Procedures performed:   Surgery: all results from this admission  Procedure(s) with comments:  Closure, Patent Foramen Ovalis (N/Green)  Via Thoracotomy (N/Green)  Excision, Mass (N/Green) - excision of cardiac mass  TEE IN CVOR (N/Green)    Reason for Admission:   CVA  Hospital Course:   Glenda Green is Green 80 year old female with PMHx for CAD, CKD, DMII, and CABG who  presented to [facility] with the acute signs and symptoms of Green left MCA  Stroke on 9/16.  This was confirmed by MRI scanning.  As part of the subsequent  workup for embolic phenomena, she was found by TEE to have Green cardiac mass.   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 She underwent resection of an intracardiac  mass and PFO closure on 9/29 in the OR by right thoracotomy. She was transferred to   the ICU on Levophed GTT, weaned off POD 0. She was in stable condition   and transferred to SDU on POD 2. On POD 4 she was ready for discharge to acute rehab  for PT/OT. She was discharge  Her Post Operative Course was complicated by the following   Brief Episode of Atrial Fibrillation, Resolved POD 1-2, with spontaneous  conversion to SR, not requiring   anticoagulation. Continue PO amiodarone PO 11 more days.  Discharged on Plavix/Asprin per Dr. Alycia Rossetti, Neurology recommendations.  Acute on Chronic Kidney Injury: Resolving, Cr peaked at 1.8 during  hospital stay, unknown baseline possibly 1.5 which was patients admission Cr.  Patient has history of hydronephrosis with Right renal stent.  Creatinine trending down 1.7 at  discharge producing adequate urine without diuretics.   Nephrology saw and recommended supportive care at this time.  Acute Cystitis: Resolving 9/28 had abnormal UA, with complaint of dysuria,  resolved without intervention. No complaints or difficulty voiding at discharge.   WBC 12 down trending, afebrile.  Hyperglycemia, DMII: Started on Basal and SSI, was consulted and followed by Endocrinology  For management.    Condition at Discharge:   Stable, acute rehabilitation.   Today:     BP 143/75   Pulse 69   Temp 98 F (36.7 C) (Oral)   Resp 16   Ht 1.651 m (5\' 5" )   Wt 70 kg (154 lb 6.4 oz)   SpO2 98%   BMI 25.69 kg/m   Ranges for the last 24 hours:  Temp:  [98 F (36.7 C)-98.6 F (37 C)] 98 F (36.7 C)  Heart Rate:  [67-75] 69  Resp Rate:  [16-18] 16  BP: (121-158)/(65-81) 143/75    Last set of labs     Recent Labs  Lab 08/25/16  0356   WBC 12.88*   Hgb 10.3*   Hematocrit 32.2*   Platelets 192       Recent Labs  Lab 08/25/16  0356   Sodium 135*   Potassium 3.8   Chloride 101   CO2 26  BUN 35.0*   Creatinine 1.7*   EGFR 29.0   Glucose 135*   Calcium 8.4       Micro / Labs / Path pending:     Unresulted Labs     Procedure . . . Date/Time    UA, Reflex to Microscopic [[id number]] Collected:  08/24/16 1231    Specimen:  Urine Updated:  08/24/16 1231          Discharge Instructions For Providers   See discharge summary    Discharge Instructions:      Contact information for follow-up providers     Christene Lye, MD. Go today.    Specialties:  Thoracic and Cardiac Surgery, Surgery, Surgical Critical Care  Why:  Plese go to cardio thoracic follow up appointment on 08/31/16 @ 3:30 pm. Please bring chest x-ray on CD to appointment. Please bring discharge instructions and medication list to appointment.  Contact information:  [address]  [place] [address]  [phone]             [facility] CARD[facility]AC [facility]. Call in 1 week(s).    Why:  To enroll, per MD (6-8 weeks post surgery).  Contact  information:  [address]  [place] [address]  [phone]                 Contact information for after-discharge care     Discharge Placement     [facility] / [facility] .    Specialty:  Rehabilitation  Contact information:  [address]  [place] [address]  [phone]                             Discharge Diet: Cardiac Diet        Discharge References/Attachments    None         Disposition:  Home or Self Care     Discharge Medication List      Taking    acetaminophen 325 MG tablet  Dose:  650 mg  Commonly known as:  TYLENOL  Take 2 tablets (650 mg total) by mouth every 6 (six) hours as needed for Fever.     amiodarone 200 MG tablet  Commonly known as:  PACERONE  Take (2) 200 mg tablets Green day for 1 day, then take (1) 200 mg tablet Green day for 10 more days.     amLODIPine 2.5 MG tablet  Dose:  2.5 mg  Commonly known as:  NORVASC  Start taking on:  08/26/2016  Take 1 tablet (2.5 mg total) by mouth daily.     aspirin EC 81 MG EC tablet  Dose:  81 mg  Take 81 mg by mouth daily.     atorvastatin 20 MG tablet  Dose:  20 mg  What changed:   medication strength   how much to take   when to take this  Commonly known as:  LIPITOR  Take 1 tablet (20 mg total) by mouth nightly.     benzocaine-menthol 15-3.6 MG Lozg lozenge  Dose:  1 lozenge  Commonly known as:  CEPACOL  Place 1 lozenge inside cheek every hour as needed (sore throat).     clopidogrel 75 mg tablet  Dose:  75 mg  Commonly known as:  PLAVIX  Start taking on:  08/26/2016  Take 1 tablet (75 mg total) by mouth daily.     DULoxetine 60  MG capsule  Dose:  60 mg  Commonly known as:  CYMBALTA  Take 60 mg by mouth daily.     ferrous sulfate 325 (65 FE) MG tablet  Dose:  325 mg  Take 325 mg by mouth every morning with breakfast.     insulin glargine 100 UNIT/ML injection  Dose:  8 Units  What changed:   medication strength   how much to take   additional instructions  Commonly known as:   LANTUS  Inject 8 Units into the skin nightly.Please reduce by 50% if NPO     * insulin lispro 100 UNIT/ML injection  Dose:  1-3 Units  Commonly known as:  HumaLOG  Inject 1-3 Units into the skin nightly as needed (as needed).     * insulin lispro 100 UNIT/ML injection  Dose:  1-5 Units  Commonly known as:  HumaLOG  Inject 1-5 Units into the skin 3 (three) times daily before meals as needed for High Blood Sugar.     * insulin lispro 100 UNIT/ML injection  Dose:  4 Units  Commonly known as:  HumaLOG  Inject 4 Units into the skin 3 (three) times daily before meals.     metoprolol 25 MG tablet  Dose:  25 mg  Commonly known as:  LOPRESSOR  Take 1 tablet (25 mg total) by mouth every 12 (twelve) hours.     polyethylene glycol packet  Dose:  17 g  Commonly known as:  MIRALAX  Start taking on:  08/26/2016  Take 17 g by mouth daily.     PROBIOTIC ADVANCED PO  Take by mouth.     senna-docusate 8.6-50 MG per tablet  Dose:  2 tablet  Commonly known as:  PERICOLACE  Take 2 tablets by mouth 2 (two) times daily.        * This list has 3 medication(s) that are the same as other medications prescribed for you. Read the directions carefully, and ask your doctor or other care provider to review them with you.            STOP taking these medications    metoprolol XL 25 MG 24 hr tablet  Commonly known as:  TOPROL-XL     nitrofurantoin 50 MG capsule  Commonly known as:  MACRODANTIN     VICTOZA 18 MG/3ML injection  Generic drug:  liraglutide          Minutes spent coordinating discharge and reviewing discharge plan: 30 minutes      Signed by: Sullivan Lone, NP

## 2016-08-25 NOTE — Plan of Care (Signed)
Patient ready to be transfer to [facility]. Patient will be transported by family.

## 2016-08-25 NOTE — Plan of Care (Addendum)
Problem: Safety  Goal: Patient will be free from injury during hospitalization  Outcome: Progressing  Patient will be discharge to [facility]. Because she is more alert and oriented today , she is able to ambulate in hall short distance with  assist of a walker and  PT. She is up in chair able to feed herself with no assist. Family at bedside to help her and to give her company. Patient Ac cu check was 350 and she was covered with 4 units of insulin. NP from Endocrinology aware. Patient denies pain or discomfort. Still incontinent of urine at this time.  Patient has right hand that is edematous , because she keeps this hand lower.Hand was elevated on pillow to help fluid circulate. Patient had a IV on wrist area laterally. Patient denies pain or discomfort on this hand.. Patient has orders to be transfer to [facility]. She has a bed there and family will transport patient. Family will received follow up instructions and list of medications, as well as follow up appointment with cardiac surgeon , cardiologist.

## 2016-08-25 NOTE — Progress Notes (Signed)
[facility] Inpatient Rehab Note:    Accepted for admission to acute rehab at [facility].   Patient will admit to Unit 5B room 521  Please call report to [phone]    Schyler Counsell, RN-BSN, [facility]  Rehab Admission Liaison  [place]   [phone]

## 2016-08-25 NOTE — Rehab Liaison Note ([facility]) (Signed)
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   [facility] Medical Record Number: [id number]  Past Medical History: Arthritis  Depression  Hyperlipidemia  Hypertension  Kidney disorder  Type 2 diabetes mellitus  Urinary tract infections    Surgical History:  Coronary Artery Bypass 2010  Cystoscopy, Ureteral Stent Insertion 08/05/2016  Metatarsal amputation: 2015x 2 three toes  Joint replacement left knee 2010    Prior Level of Functioning:  Mobility:   Mod I for mobility, uses FWW for community ambulation.  Activities of Daily Living:  Independent with ADL's  Cognition:  WNL  Communication:  WNL  Swallowing:  WNL    CARE Tool  Prior Functioning:  Self Care: Patient completed the activities by him/herself, with or without an  assistive device, with no assistance from a helper.  Indoor Mobility: Patient completed the activities by him/herself, with or  without an assistive device, with no assistance from a helper.  Stairs: Patient completed the activities by him/herself, with or without an  assistive device, with no assistance from a helper.  Functional Cognition: Patient completed the activities by him/herself, with or  without an assistive device, with no assistance from a helper.  Prior Device Use: Walker  Health Conditions: Patient has not had any falls in the past year. Patient has  not had major surgery during the 100  days prior to admission.    Social History:  Marital Status: Widowed  Children: Son          Reside:  Lives with patient  Employment Status:  Retired  Investment banker, corporate:   Patient  lives in basement of sons house in [place] with kitchenette.  Pt  is staying with daughter/son in [place] for past month and will discharge  there.    Pre-hospital Living Environment: Patient  lives in basement of sons house in [place]  with kitchenette.  Performs stairs to main kitchen.  Has been staying with daughter/son in [place] for past month and will discharge  there. 2 level SFH with one step to enter, no railing. Is able to remain on  entry level of home if needed.  Family is supportive and can assist as needed.  DME prior to admission: Four wheel walker, Front wheel walker, Wheelchair-manual  (shower chair)    Language:  English  Hand Dominance: Right.    The following information was gathered for consideration and maintenance in the  medical record to substantiate medical necessity for an IRF level of care.    This assessment is being completed by Pantera Winterrowd , BSN, RN . Patient is  currently at [facility] [phone]  [facility], [place] [phone].    The patient is being referred and recommended by their physician, Glenda Lye,  MD, to be assessed both medically and functionally in regard to their premorbid  functional capacity to determine whether they can  benefit from a rehabilitation  level of care offered by our facility.    The following is information regarding the medical complexity and clinical risk  factors that needs to be considered for the appropriate management of the  patient's care and recovery.  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 Found by family in  her room struggling to put on her shirt, not speaking or responding properly.  She was outside window for lytics.  Past Medical History of DM2, HTN, HLD, CAD with CABG, and Recurrent UTIs (s/p  Renal Stent 08/05/16).    Neurological: Acute CVA: with mild right sided deficits, cardioembolic in  etiology    Cardiac workup: Found to have Large RA mass occupying much of the RA and  Possible interatrial shunt  Right Atrial Myxoma found on Echo and confirmed on TEE.  TEE confirms: Large RA mass taking up most or FRA cavity but not obstructing or  prolapsing across the TV orifice. Size: at least 5.5 cm in height and 3.5 cm in  width.  Cardiac Cath was completed and patient cleared for surgery  Now s/p atrial septal mass excision via right thoracotomy and PFO closure on  08/21/16    Cardiovascular:SR 80's EF: 65%, Moderate Aortic Stenosis  CAD s/p 4V-CABG in 2010 [facility], [place])  Left Heart Cath 08/17/16 - 3/4 bypass grafts patent with chronic occlusion of RCA  and right coronary bypass graft - stable anatomy.  HTN: Continue Metoprolol, norvasc added due to episode of hypertension  overnight.  Plavix started per neurology recommendations.  Core Measures: Aspirin - Y; Beta blocker - Y; ACE - N; Statin - Y    Pulmonary: Weaned to RA  Chest tubes: removed on 08/23/16  Chest X-ray with Minimal Bibasilar atelectasis, encourage Incentive Spirometry  use.    Renal/GU: UTI Completed course of IV Ceftriaxone  Obstructive uropathy recent ureteral stenting 07/2016, creat falling but  fluctuating: from 1.3-1.8  Prior renal artery stenosis w stenting  AKI on CKD: resolving, Cr 1.7,  Incontinence yesterday no reports overnight.    Hematological:Hct 10/32, on ASA  DVT prophylaxis: SCDs, Hep SQ    Infectious Disease: Afebrile, WBC:12 down  trending.    Endocrinological: DM: A1c 7.7,  Continue Sliding Scale Insulin  Endocrine consult completed    Pain is well controlled. Paravertebral pain pump removed 08/24/16, did not  require pain medication overnight.  Evaluated by therapy with recommendations for acute rehab level of care. Patient  was Mod I for mobility and Independent for ADL's. Requires ongoing medical  management of current condition and comorbid conditons. Medically stable for  transfer to acute rehab 08/25/16.   Date of Onset: 08/08/16   Date Admitted to Acute:  08/09/16  Current Precautions:   Falls  L MCA CVA on admission  Food Allergies  No known food allergies.  Medication Allergies   No known medication allergies.  Other Allergies Adhesive Tape: Unknown reaction.    Present Systems Summary:  Vital signs: Maximum Temperature (last 48 hrs):  98.6 degrees.  Pulse:  73 beats per  minute.  Respirations:  16 breaths per minute.  Blood Pressure:   121/68 mmHg.  Pulse Ox: 97% on RA  Height/weight:    5 feet 5 inches   154 lbs  Hearing: No current issues  Vision: No current issues.  Other medical comments:   N/A  DIET:  Current Diet Texture: Regular diet.  Current Liquid Consistency: Thin liquids.  Type: Consistent carbohydrate  Bladder: Patient is incontinent with number of accidents unknown.  Bowel: Patient does not have an ostomy.  Date of Last Bowel Movement:  08/24/16 Patient is continent of bowel.  Integumentary:   Incision to Right Chest: open to air, well approximated  Skin tear to Left arm  Cardiopulmonary:   Test, Date, and Results:  Echocardiogram Adult Transesophageal W Color Doppler  Waveform:  SUMMARY:  Left ventricle:-  Size was normal.-  Systolic function was normal. Ejection  fraction was estimated to be 55 %.-  There were no regional wall motion  abnormalities.-  There was hypokinesis of the basal anteroseptal wall(s).-  Wall  thickness was normal.    Aortic valve:-  The valve was trileaflet. Leaflets exhibited  moderately  increased thickness, moderate calcification, and moderately reduced cuspal  separation.  Transaortic velocity was within the normal range.There was moderate stenosis.  There was no regurgitation. There was no evidence for vegetation.    Aorta, systemic arteries:-  There was mild atheroma.  Mitral valve:-  There was trivial regurgitation.    Left atrial appendage:-  The appendage was small.-  No thrombus was identified.  Atrial septum:-  There was a probable small atrial septal defect.-  No defect or  patent foramen ovale was identified.-  Doppler evaluation was performed. There  was a right-to-left shunt, in the baseline state.    Right ventricle:-  Systolic function was reduced.  Pulmonic valve:-  There was no significant regurgitation.  Right atrium:-  The RA cavity is almost entirely occupied by a large (At least  5.5 x 3.5  cm), echodense, somewhat speckled-appearing echogenic mass. The point of  attcahment is not well seen but it is most closely associated with the superior  aspects of the RA. It does not directly overlay the tricuspid annulus, nor does  it prolapse across the tricuspid orifice during diastole. The visual appearance  is most consistent with a large right atrial myxoma.    Pericardium:-  There was no pericardial effusion.    Recommendation:-  Have consulted cardiac surgery as she has had a CVA, has a  large RA mass and what appears to be a small atrial shunt that could have  allowed tumor or thrombotic material to ebolize fromt he RA into the systemic  circulation.  Althought he echo appearance is suggestive of a myxoma, given her age and the  massive nature of the mass A CT scan of the chest and abdomen would be  suggested to assess for any systemic malignant process that could have invaded  the right heart via venous extension.    RECOMMENDATIONS:  Have consulted cardiac surgery as she has had a CVA, has a large RA mass and  what appears to be a small atrial shunt that could have  allowed tumor or  thrombotic material to ebolize fromt he RA into the systemic circulation.  ALthought he echo appearance is suggestive of a myxoma, given her age and the  massive nature of the mass A CT scan of the chest and abdomen would be  suggested to assess for any systemic  malignant process that could have invaded  the right heart via venous extension.   Test, Date, and Results:  Left Heart Cath Poss PCI: CONCLUSIONS:  1.  Coronary artery disease, severe native triple-vessel disease.  2.  Chronic occlusion of the proximal right coronary artery with occluded  vein graft to the right coronary artery.  3.  Widely patent LIMA bridge graft to LAD and diagonal and saphenous vein  graft to the circumflex.  4.  Low normal ejection fraction by echocardiogram with inferoapical  hypokinesis consistent with a right coronary occlusion.  5.  Stable hemodynamics with low normal left ventricular end-diastolic  pressure.  Dialysis: Patient currently is not receiving dialysis.  Current Medication(s): 08-25-16   amiodarone (PACERONE) tablet 200 mg, PO, Q12H  amLODIPine (NORVASC) tablet 2.5 mg, PO, Daily  aspirin EC tablet 81 mg, PO, Daily  atorvastatin (LIPITOR) tablet 20 mg, PO, QHS  clopidogrel (PLAVIX) tablet 75 mg, PO, Daily  DULoxetine (CYMBALTA) DR capsule 60 mg, PO, Daily  heparin (porcine) injection 5,000 Units, SC, Q8H  insulin glargine (LANTUS) injection 8 Units, SC, QHS  metoprolol (LOPRESSOR) tablet 25 mg, PO, Q12H  mupirocin (BACTROBAN NASAL) 2 % ointment 0.9 each, NA, Q12H  polyethylene glycol (MIRALAX) packet 17 g, PO, Daily  senna-docusate (PERICOLACE) 8.6-50 MG 2 tablet, PO, BID  sodium chloride (PF) 0.9 % flush 3 mL, IV, Q8H    PRN  acetaminophen (TYLENOL) tablet 650 mg, PO, Q4H PRN last given: 09/30 0432  benzocaine-menthol (CEPACOL) l1 lozenge, BU, Q1H PRN last given: 10/02 1834  bisacodyl (DULCOLAX) suppository 10 mg, RE, QD PRN  dextrose (D10W) 10% bolus 250 mL, IV, PRN  glucagon (rDNA) (GLUCAGEN)  injection 1 mg, IM, PRN  insulin lispro (HumaLOG) injection 2 Units, SC, QHS PRN last given: 10/02 2159  insulin lispro (HumaLOG) injection 2 Units, SC, TID AC PRN last given: 10/02  1209  ondansetron (ZOFRAN) injection 4 mg, IV, Q8H PRN last given: 09/30 2026  ondansetron (ZOFRAN-ODT) disintegrating tablet No Dose/Rate, PO, Q8H PRN  traMADol (ULTRAM) tablet 50 mg, PO, Q4H PRN last given: 10/02 7829  Current Alcohol Use:  Patient does not consume alcohol  Current Tobacco Use:  No, patient does not use tobacco  Current Drug Use: No, patient does not use recreational drugs.  Pain: Patient currently without complaints of pain.    Additional medical documentation contributing to the expected care of this  patient may be noted in the following areas:  Laboratory-Chemistry/Hematology: .   08/25/2016  WBC: 12.88 (H)  Hemoglobin: 10.3 (L)  Hematocrit: 32.2 (L)  Platelet Count: 192  RBC: 3.65 (L)  MCV: 88.2  MCH, POC: 28.2  MCHC: 32.0  RDW: 15  MPV: 10.8    08/24/2016  WBC: 17.22 (H)  Hemoglobin: 10.4 (L)  Hematocrit: 32.4 (L)  Platelet Count: 189  RBC: 3.62 (L)  MCV: 89.5  MCH, POC: 28.7  MCHC: 32.1  RDW: 15  MPV: 11.0  Glucose: 135 (H)  BUN: 35.0 (H)  Creatinine: 1.7 (H)  Sodium: 135 (L)  Potassium: 3.8  Chloride: 101  CO2: 26  Calcium: 8.4  EGFR: 29.0  Magnesium: 1.9    08/14/2016  Creatinine, UR: 38.4  Urine Protein/Creatinine Ratio: 4.9  Urine Protein Random: 187.3 (H)    08/20/2016  Urine Type: Clean Catch  Color, UA: Yellow  Clarity, UA: Cloudy (A)  Specific Gravity, UA: 1.010  Urine pH: 6.0  Leukocyte Esterase, UA: Large (A)  Nitrite, UA: Negative  Protein, UR: 100 (A)  Glucose,  UA: >=500 (A)  Ketones UA: Negative  Urobilinogen, UA: Normal  Bilirubin, UA: Negative  Blood, UA: Moderate (A)  RBC UA: TNTC (A)  WBC, UA: TNTC (A)  WBC Clumps, UA: RARE    08/09/2016 (labs on admission)  Hemoglobin A1C: 7.7 (H)    08/08/2016 13:18  WBC: 11.86 (H)  Hemoglobin: 11.3 (L)  Hematocrit: 35.5 (L)  Platelet Count: 315  RBC: 4.12  (L)  MCV: 86.2  MCH, POC: 27.4 (L)  MCHC: 31.8 (L)  RDW: 14  MPV: 9.6  Neutrophils: 78.2  Lymphocytes Automated: 13.7  Monocytes: 6.3  Eosinophils Automated: 0.6  Basophils Automated: 0.7  Immature Granulocyte: 0.5  Nucleated RBC: 0.0  Neutrophils Absolute: 9.28 (H)  Abs Lymph Automated: 1.62  Abs Eos Automated: 0.07  Abs Mono Automated: 0.75  Absolute Baso Automated: 0.08  Absolute Immature Granulocyte: 0.06 (H)  Absolute NRBC: 0.00  Cultures:   Blood cultures x 2 sites: 08/08/16 Negative    Culture Urine  08/09/16  10,000 - 30,000 CFU/ML Enterococcus species             No further work,             Questionable significance due to low quantity  08/09/16   30,000 - 50,000 CFU/ML Gram negative rod             Lactose fermenting gram negative rod, No further work,             Questionable significance due to low quantity.    Culture Blood Aerobic and Anaerobic  FINAL 08/14/16   No growth after 5 days of incubation.    08/14/16   Negative for Methicillin Resistant Staph aureus nares and throat  Radiology:   08/23/16  X-ray chest AP portable 1. No change in lines and tubes.  Minimal  basilar atelectasis. Negative for pneumothorax    08/13/16 CT Abdomen Pelvis W PO Contrast Only  No evidence of metastatic disease  on this noncontrast study. Bilateral hydronephrosis. Right ureteral stent in  place. Fatty lesion in the inferior to the left kidney of uncertain etiology.  Findings may be due to fat necrosis however liposarcoma is not excluded.    08/13/16 CT Chest WO Contrast: 1. Irregular opacity at the right lung base  medially. Findings may be due to scarring however metastatic disease is not  excluded. Follow-up in the prior studies are available then comparison to prior  studies is  recommended..2. Sclerotic lesion left T4 pedicle. This is indeterminate.  Metastatic  disease is not excluded.    Ct Angiogram Head 08/08/2016: Possible very subtle perfusion abnormality about  the posterior aspect of left MCA territory. No  definite thrombosis or definite  penumbra. 2.  Lucency evident involving the right medial occipital cortex and  the left cerebellar cortex, consistent with ischemia, age-indeterminate but  likely old. 3.  Atherosclerotic stenosis as noted above.    Ct Head Wo Contrast  08/08/2016:  Lucency of the left cerebellar cortex in the  right medial occipital cortex, findings consistent with infarction, age  indeterminant but likely old.    Ct Head W Contrast 08/08/2016: Possible very subtle perfusion abnormality about  the posterior aspect of left MCA territory. No definite thrombosis or definite  penumbra. 2.  Lucency evident involving the right medial occipital cortex and  the left cerebellar cortex, consistent with ischemia, age-indeterminate but  likely old. 3.  Atherosclerotic stenosis as noted above.    Ct Angiogram Cerebral Perfusion W 3d Reconstruction 08/08/2016:  Possible very  subtle perfusion abnormality about the posterior aspect of left MCA territory.  No definite thrombosis or definite penumbra. 2.  Lucency evident involving the  right medial occipital cortex and the left cerebellar cortex, consistent with  ischemia, age-indeterminate but likely old. 3.  Atherosclerotic stenosis as  noted above.    Ct Angiogram Neck 08/08/2016  Possible very subtle perfusion abnormality about  the posterior aspect of left MCA territory. No definite thrombosis or definite  penumbra. 2.  Lucency evident involving the right medial occipital cortex and  the left cerebellar cortex, consistent with ischemia, age-indeterminate but  likely old. 3.  Atherosclerotic stenosis as noted above.    Mri Brain Wo Contrast 08/09/2016: Small, acute cortical infarct involving a  single left parietal gyrus without mass effect or hemorrhage. 2. Generalized  cerebral volume loss and advanced sequela of chronic microvascular change. 3.  Chronic left cerebellar infarct. 4. Limited exam.    Fluoroscopy Retrograde Pyelogram With And Without Kub 08/05/2016  Retrograde  pyelogram under fluoroscopic guidance. 2. Short segment severe narrowing distal  right ureter. 3. Please see operative report for further details.    US Renal Kidney: 08/09/2016 Bilateral collecting system dilatation right more  than left. Stent present on the right can be followed from the renal pelvis into  the bladder. Bladder trabeculation. Mild increase in renal cortical echogenicity  bilaterally. Comment: Findings discussed with the patient's attending physician  at the time of dictation.    Xr Chest  Ap Portable 08/08/2016 Hypoventilatory change, basilar atelectasis.    IVs:  IV Access: No IV access.    Is patient presently participating in rehabilitation? Yes  Adjustment to Present Illness: Patient is coping adequately.   Patient is accepting limitations adequately.   Patient's expectations are realistic.   Patient is motivated.  Activity Tolerance:  Good.  SPECIAL NEEDS: None.    Current Functional Status  Weight-bearing Status:   No Restrictions  Mobility: 08/25/16  Functional Mobility  Sit to Stand: min A  Stand to Sit: min A    Ambulation  Level of Assistance required: min A  Ambulation Distance: 40' x 3 times  Pattern: decreased R foot clearance, fatigues quickly  Device Used for Ambulation: front wheel walker  Weightbearing Status: no restrictions    Balance  Static Sitting: Good  Dynamic Sitting: Fair+  Static Standing: Poor+ with front wheel walker  Dynamic Standing: Poor with front wheel walker  Activities of Daily Living: 08/25/16  Self Care and Home Management  Eating: I hand to mouth  Grooming: set up, CG A for standing initially but then knees buckling requiring  min/mod A  UE Dressing: mod A gown  LE Dressing: max A  Toileting: CG A in sitting; max A in standing for management of clothing  Cognition: 08/25/16  Alert and oriented  Follows commands without difficulty.  Communication: 08/25/16  WNL  Swallowing:  08/25/16  WNL  Other Impairments: Impaired gait, decreased endurance, and  decreased balance.  decreased R LE foot clearance with ambulation    Patient is able to understand and make healthcare decisions  Yes  Payer Source: Level of care will be discussed with the following payer sources  if/when applicable.  Primary: Medicare A and B  Secondary: [place] [employer]    Information and Case Discussion:   Rehabilitation risks/benefits were reviewed.  Patient/family/caregiver agrees to/accepts rehabilitation risks/benefits.   Rehab literature/brochure was provided to: 08/25/16  Discussed with patient and family. Explained acute rehab level of care  and  program at [facility]. Patient verbalized understanding. Provided brochure and liaison  contact information. Patient would like to pursue acute rehab level of care at  [facility] .  Case will be discussed with Physician/Medical Director.        IRF Admission Approval/Non-Approval  Appropriateness for admission to the Inpatient Rehabilitation Facility:  The  patient's condition is sufficiently stable to allow active participation in an  intensive interdisciplinary inpatient rehabilitation program. The patient would  benefit from interdisciplinary inpatient rehabilitation provided by a physician,  rehab-focused nursing, and a minimum of two rehab therapies which will provide  specialized care for the following functional deficits:   Bladder Management.  Bowel Management.  Cardiac Function  Endocrine  Hydration  Leisure Skills  Metabolic Function  Mobility  Nutrition  Pain Management  Psychosocial  Respiratory  Risk of Infection  Safety Risk  Self Care Management  Skin Wound Management  Comorbid conditions present at pre-admission:  The interdisciplinary team will also manage the potential risks and  complications from the following comorbid conditions:    AKI  CKD Stage 3  Bilateral hydronephrosis  DM 2  Nephrotic range proteinuria  Atrial myxoma  Hydronephrosis of right kidney  Cerebrovascular accident (CVA)  UTI  HTN  HLD  Depression  CAD  Pain  Recommended  services:  The recommended interdisciplinary team will be comprised of the following  services:   Medical Supervision.  24 Hour Rehabilitation Nursing.  Physical Therapy.  Occupational Therapy.  Therapeutic Recreation.  Registered Dietitian.  Respiratory Therapy.  Patient's expected intensity and frequency of participation in the  interdisciplinary rehabilitation program: is 3 hours of therapy 5 days/week.  Prognosis and level of expected improvement with inpatient rehabilitation stay  is:   Mod I to supervision for mobility with least restrictive device  Mod I to supervision for ADL's in least restrictive environment  Estimated date of admission to acute inpatient:   08/25/16  Estimated length of inpatient rehabilitation stay in order to achieve rehab  medical/functional goals:   7-10 days  Anticipated destination post discharge from inpatient rehabilitation is:   community discharge with assistance.    Anticipate patient will need the following services post discharge from  inpatient rehabilitation: Home health therapy. Home health nursing. Outpatient  therapy.    Physician Approval Status of Admission: Admission Approval:  The patient's  condition is sufficiently stable to allow active participation in an intensive  interdisciplinary inpatient rehabilitation program. 80 year old F patient  presents with left MCA stroke, now aphasic. 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 Patient with ambulation and ADL deficits. Will tolerate  greater than 3h ours of therapy. Recommend acute rehab IRF.    This assessment denotes that on admission to the inpatient rehabilitation  facility, our physician will provide documentation that demonstrates clinical  rehabilitation complications for which the patient is at risk and a specific  plan to avoid those risks. Further, the medical conditions present create  possible adverse conditions that predictably can be controlled through an  intensive rehabilitation plan of  care to be outlined at admission.    Department of Medical Assistance Services [facility])  Intensive Rehabilitation Admission Certification    I. Certification Statement:    In accordance with 42 CFR 456.60, I certify that Dawna Part  meets the  admission criteria for intensive rehabilitation services set forth in 12 VAC  30-60-120.    II. Criteria Determination:  (In order to meet intensive rehabilitation criteria, the  recipient must require  all the items listed below)    The rehabilitation cannot be safely and adequately carried out in a less  intensive setting; and    The interdisciplinary coordinated team approach is required; and    The recipient requires at least 2 of the the four therapies:    Physical Therapy services on a daily basis  Occupational Therapy services on a daily basis  CognitiveTherapy services on a daily basis  Speech-Language Pathology services on a daily basis    III. Physician Signature Required:    Signed by: Brexlee Heberlein, BSN, RN 08/25/2016 1:14:00 PM    Physician CoSigned By: Eppie Gibson 08/25/2016 14:31:29

## 2016-08-25 NOTE — Progress Notes (Signed)
08/25/16 Discharge Summary  Pt ready for d/c to [facility] Hospital today. Assigned to Room 521 Unit B. Report may be called to [phone]. Provided update to pt, pt. Daughter in Social worker and Veterinary surgeon. [facility] has copy of AVX and d/c orders. Pt daughter in law will transport pt via private car. Provided daughter in law with address and phone number of facility as well as Room and Unit information. Pt has cardio-thoracic f/u appt scheduled on 08/31/16 @ 1530.  Neila Gear RN, [place]  Case Management  #[id number]     08/25/16 1644   Discharge Disposition   Patient preference/choice provided? Yes   Physical Discharge Disposition Acute Rehab   Receiving facility, unit and room number: [facility] Room: 521 [address] 5B   Nursing report phone number: [phone]   Mode of Transportation Car   Pick up time early evening   Patient/Family/POA notified of transfer plan Yes   Patient agreeable to discharge plan/expected d/c date? Yes   Family/POA agreeable to discharge plan/expected d/c date? Yes   Bedside nurse notified of transport plan? Yes   Hard copy of narcotic RX sent with patient? N/A   Hard copy of DNR/Advance Directive sent with patient? N/A   IV antibiotics post discharge? N/A   Wound care post discharge? N/A   CM Interventions   Follow up appointment scheduled? Yes   Follow up appointment scheduled with: Other (comment)  (cardio-thoracic f/u appt on 08/31/16 @ 1530.)   Referral made for home health RN visit? No, Other (comment)  (d/c to acute rehab.)   Multidisciplinary rounds/family meeting before d/c? Yes   Medicare Checklist   Is this a Medicare patient? Yes   Patient received 1st IMM Letter? Yes   3 midnight inpatient qualifying stay (SNF only) Yes   If LOS 3 days or greater, did patient received 2nd IMM Letter? Yes

## 2016-08-25 NOTE — PT Progress Note (Signed)
[facility]   Physical Therapy Treatment  Patient:  Glenda Green MRN#:  [id number]  [facility]  Bed: FI259/FI259-01    Discharge Recommendations:   D/C Recommendations: Acute Rehab   DME Recommendations: DME Recommended for Discharge: Front wheel walker, Wheelchair-manual, BSC, Shower chair    If Acute Rehab recommended discharge disposition is not available, patient will need min assist for all mobility, equipment listed above, and HHPT.        Assessment:   Pt demonstrates improved ambulation distance, ambulating 40' this session, but continues to show decreased functional mobility, impaired gait, decreased endurance, and decreased balance. She has decreased R LE foot clearance with ambulation. Pt continues to benefit from skilled PT to increase functional mobility for improved safety. It is anticipated pt will tolerate 3 hours of therapy/day and would benefit from acute rehab.    Treatment Activities: Gait training    Educated the patient to role of physical therapy, plan of care, goals of therapy and safety with mobility and ADLs.    Plan:   PT Frequency: 3-4x/wk    Continue plan of care.       Precautions and Contraindications:   Falls    Updated Medical Status/Imaging/Labs:   Reviewed    Subjective:    "You don't have to be so optimistic"  Patient's medical condition is appropriate for Physical Therapy intervention at this time. Nursing clears pt for PT. Patient is agreeable to participation in the therapy session.    Pain:   Pt had no c/o pain throughout session.     Objective:   Patient is seated in chair in bathroom with telemetry in place and OT present.    Cognition  Pt is alert and oriented throughout session.   Follows commands without difficulty.    Functional Mobility  Sit to Stand: min A  Stand to Sit: min A    Ambulation  Level of Assistance required: min A  Ambulation Distance: 40' x 3 times  Pattern: decreased R foot clearance, fatigues quickly  Device  Used for Ambulation: front wheel walker   Weightbearing Status: no restrictions    Balance  Static Sitting: Good  Dynamic Sitting: Fair+  Static Standing: Poor+ with front wheel walker   Dynamic Standing: Poor with front wheel walker     Patient Participation: Good  Patient Endurance: Fair    Vitals  Stable throughout session, SpO2 97%    Patient left with call bell within reach, all needs met, SCDs not in place, as when entered room, fall mat in place, and chair alarm in place and all questions answered. RN notified of session outcome and patient response.     Goals:  Goals  Goal Formulation: With patient/family  Time for Goal Acheivement: 5 visits  Goals: Select goal  Pt Will Go Supine To Sit: with contact guard assist  Pt Will Sit Edge of Bed: 3-5 min, with supervision, to maximize functional mobility and independence (without LOB and neutral sitting balance)  Pt Will Transfer Bed/Chair: with rolling walker, with contact guard assist  Pt Will Ambulate: 51-100 feet, with rolling walker, with contact guard assist  Pt Will Go Up / Down Stairs: 3-5 stairs, with minimal assist, With rail  Pt Will Perform Home Exer Program: with supervision (cues)      Dolphus Jenny, PT, DPT   Pager number: [phone]    Time of Treatment:  PT Received On: 08/25/16  Start Time: 1016  Stop Time: 1026  Time Calculation (min): 10 min  Treatment # 1 out of 5 visits

## 2016-08-25 NOTE — Progress Notes (Signed)
ENDOCRINE PROGRESS NOTE    Date Time: 08/25/16 1:00 PM  Patient Name: Glenda Green,Glenda Green  Attending Physician: Christene Lye, MD  Consulting Physician: Heber Carolina, MD      CC: management of hyperglycemia with underlying DM    Assessment/Plan:     1. Hyperglycemia with underlying T2DM: HgbA1c 7.7%, diagnosed about 20 years ago, managed by PMD. Home regimen includes lantus 10 units qhs and victoza 1.8 mg sc qHS. Morning glucose usually 130-140s per patient, denies hypoglycemia. Denies complications such as retinopathy or neuropathy. Son helps with injections.                 -  BG 138, 350 so far today   - Continue lantus 8 units qhs - reduce by 50% if NPO   - Start humalog 4/4/4 qac - hold if NPO              - Continue low dose humalog SSI qac and qhs PRN                - Creatinine 1.7 today                - Recommend discharge with above regimen to rehab, to adjust there as needed. Discussed with patient, will need f/u as outpatient with PMD. Discussed with daughter in law, will set up home care for patient to assist with injections.  Resume victoza upon discharge from rehab.                 - Please call x [phone] or page [phone] with questions      HPI/Subjective: 80 y.o. 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 Consult for diabetes management during hospital course.     Patient currently seen in no acute distress. Feeling better today, alert and oriented x 3. Denies any pain, no nausea, vomiting, abdominal pain. Appetite reduced however consuming at least 50%.     Review of Systems:   Review of Systems - reduced appetite; denies n/v/d, abdominal pain; all other systems reviewed and negative      Physical Exam:   Patient Vitals for the past 24 hrs:   BP Temp Temp src Pulse Resp SpO2    08/25/16 1144 121/68 98 F (36.7 C) Oral 73 16 97 %   08/25/16 0831 - 98.4 F (36.9 C) Oral - 16 97 %   08/25/16 0458 144/81 98.6 F (37 C) Oral 75 16 99 %   08/24/16 2329 158/74 98.2 F (36.8 C) Oral 68 18 94 %   08/24/16 2023 136/65 98.4 F (36.9 C) Oral 67 18 93 %   08/24/16 1500 145/72 98.2 F (36.8 C) Oral 60 18 94 %     Body mass index is 25.69 kg/m.    Intake/Output Summary (Last 24 hours) at 08/25/16 1300  Last data filed at 08/24/16 2300   Gross per 24 hour   Intake              300 ml   Output                0 ml   Net              300 ml       General: awake, alert,  oriented x 3; no acute distress.  Cardiovascular: regular rate and rhythm, no murmurs, rubs or gallops  Lungs: clear to auscultation bilaterally, without wheezing, rhonchi, or rales  Abdomen: soft, non-tender, non-distended; +bowel sounds  Extremities: no clubbing, cyanosis, or edema    Meds:     Current Facility-Administered Medications   Medication Dose Route Frequency   . amiodarone  200 mg Oral Q12H SCH   . amLODIPine  2.5 mg Oral Daily   . aspirin EC  81 mg Oral Daily   . atorvastatin  20 mg Oral QHS   . clopidogrel  75 mg Oral Daily   . DULoxetine  60 mg Oral Daily   . heparin (porcine)  5,000 Units Subcutaneous Q8H SCH   . insulin glargine  8 Units Subcutaneous QHS   . insulin lispro  4 Units Subcutaneous TID AC   . metoprolol  25 mg Oral Q12H SCH   . mupirocin   Nasal Q12H SCH   . polyethylene glycol  17 g Oral Daily   . senna-docusate  2 tablet Oral BID   . sodium chloride (PF)  3 mL Intravenous Q8H         Labs:     Hemoglobin A1C (%)   Date Value   08/09/2016 7.7 (H)       POCT - Glucose Whole blood   Date/Time Value Ref Range Status   08/25/2016 1141 350 (H) 70 - 100 mg/dL Final   57/84/6962 9528 138 (H) 70 - 100 mg/dL Final   41/32/4401 0272 260 (H) 70 - 100 mg/dL Final   53/66/4403 4742 210 (H) 70 - 100 mg/dL Final   59/56/3875 6433 210 (H) 70 - 100 mg/dL Final           Recent Labs      08/25/16   0356  08/24/16   1231    WBC  12.88*  17.22*   Hgb  10.3*  10.4*   Hematocrit  32.2*  32.4*   Platelets  192  189   MCV  88.2  89.5       Recent Labs      08/25/16   0356  08/24/16   1230  08/23/16   0643   Sodium  135*  133*  137   Potassium  3.8  4.5  4.2   Chloride  101  99*  105   CO2  26  25  24    BUN  35.0*  36.0*  38.0*   Creatinine  1.7*  1.8*  1.5*   Glucose  135*  216*  105*   Calcium  8.4  8.3  8.9   Magnesium  1.9  1.7  2.0   Phosphorus   --    --   4.0         Case discussed with Dr. Leotis Shames      Signed by: Lelon Mast Denyse Dago, PA-C

## 2016-08-25 NOTE — Plan of Care (Signed)
Problem: Safety  Goal: Patient will be free from injury during hospitalization  Outcome: Progressing   08/25/16 0314   Goal/Interventions addressed this shift   Patient will be free from injury during hospitalization  Assess patient's risk for falls and implement fall prevention plan of care per policy;Provide and maintain safe environment;Ensure appropriate safety devices are available at the bedside;Include patient/ family/ care giver in decisions related to safety;Provide alternative method of communication if needed (communication boards, writing)       Comments: Pt is alert & oriented during this shift. NSR on monitor  HR in 60-70s On RA sating 95%, SBP 130-140s.  Pt is incontinent to bladder , changed the pad frequently, Skin care given. OOB x2 person assistance.Ass Rt  arm +2pitting edema noted.  Rt upper arm midline in place. CHG bath done , flushed , blood return noted Rt chest incision covered with transparent film, Rt groin, CDI , OTA. Safety precautions per protocol, fall mat down , call bell personal belonging at bedside . Will continue to monitor by hourly rounding

## 2016-08-25 NOTE — Op Note (Signed)
Procedure Date: 08/21/2016     Patient Type: I     SURGEON: Christene Lye MD  ASSISTANT:       ASSISTANT:  Cindie Laroche, RNFA.     PREOPERATIVE DIAGNOSES:  1.  Cardiac mass.  2.  History of left middle cerebral artery cerebrovascular accident.  3.  History of coronary artery disease, status post coronary artery bypass  grafting in the past.  4.  Hypertension.  5.  Dyslipidemia.  6.  Renal insufficiency.  7.  Patent foramen ovale.       POSTOPERATIVE DIAGNOSES:  1.  Cardiac mass.  2.  History of left middle cerebral artery cerebrovascular accident.  3.  History of coronary artery disease, status post coronary artery bypass  grafting in the past.  4.  Hypertension.  5.  Dyslipidemia.  6.  Renal insufficiency.  7.  Patent foramen ovale.     TITLE OF PROCEDURE:  1.  Resection of intracardiac mass via a right anterior thoracotomy.  2.  Right groin cutdown for femoral arterial and venous cannulation.  3.  Transesophageal echocardiography.     INDICATIONS FOR PROCEDURE:  Glenda Green is a 79 year old female with past medical history significant  for coronary artery disease and coronary artery bypass grafting who  presented to [facility] with the acute signs and symptoms of a left MCA  stroke.  This was confirmed by MRI scanning.  As part of the subsequent  workup for embolic phenomena, she was found by TEE to have a cardiac mass.   This was felt to be a right atrial myxoma. The patient was educated  regarding the risks and benefits of operative intervention and informed  consent was obtained.     OPERATIVE FINDINGS:  The patient had minimal adhesions in the right chest and only mild  adhesions in the pericardial space.  There was no evidence of a myxoma in  the right atrium.  However, she had dense lipomatous infiltration of her  interatrial septum.  This was approximately 5 cm in its maximum dimension.   A large excisional biopsy was performed and sent for frozen section, which  confirmed lipomatous cells with no  evidence of malignancy.  There was no  difficulty weaning from cardiopulmonary bypass.     DESCRIPTION OF PROCEDURE:  The patient was brought to the operating room, placed on the operating  table in the supine position.  Surgical timeout was called and confirmed.   After the induction of excellent general endotracheal anesthesia, the  patient was prepped and draped in the usual sterile fashion.  Appropriate  antibiotics were infused within 30 minutes of the procedure start.  We  began with a minimally invasive right mini thoracotomy incision and the  chest was entered in the fifth intercostal space.  This incision was kept  in the inframammary fold.  There was only a mild amount of intrathoracic  adhesions on the right side and the pericardium was readily visible.  At  this time, a right groin cutdown was performed and the right common femoral  artery and vein were identified.  Using ultrasound guidance, the left  subclavian vein was entered using a micropuncture kit and using wire  exchange, a 17-French arterial cannula was advanced into the vein to  provide superior vena caval drainage.  Using ultrasound guidance, the left  common femoral artery and vein were similarly cannulated using Seldinger  technique and appropriate size cannulas.  After confirmation of appropriate  ACT level, cardiopulmonary bypass  was initiated.  The patient was cooled to  a systemic temperature of 28 degrees.  While on bypass and cooling the  pericardium was opened and the pericardial adhesions were carefully taken  down.  It was immediately apparent that there was a large bulge in the  interatrial septum but it was unclear whether this represented a mass  inside the right atrium.  Once the patient had reached a target temperature  of 28 degrees, ventricular fibrillation was induced using epicardial  ventricular wire and the right atrium was opened.  The right atrium was  opened.  Using soft bulldogs the IVC and SVC were occluded and a  sump was  placed in the right atrium for additional venous drainage.  It became  immediately apparent that there was no mass inside the right atrium.  A  small patent foramen ovale was identified and closed using a pledgeted  suture.  Further review of the echocardiogram suggested that this could be  an inter-septal lesion which was consistent with what we were seeing  intraoperatively.  Because of the concern for potential malignancy, the  initial incision was opened to allow greater access to the interatrial  septum.  The area of concern was incised and unroofed.  A large lipomatous  mass was identified that was densely adherent to the interatrial septum.  A  large excisional biopsy was performed and this was sent to pathology.   Frozen section confirmed the presence of lipomatous cells, but did not find  any evidence of malignancy.  With this in mind, rewarming was begun.  The  right atrium was closed with a double-layer of Prolene.  Two 24 Blake  drains were placed via separate stab incisions to drain the mediastinum and  pleural space and temporary atrial wires were affixed to the epicardial  surface of the heart.  Once normothermia had been achieved, the patient was  gradually weaned from cardiopulmonary bypass without difficulty.  Protamine  was administered.  Cannulas were removed and hemostasis was obtained.  At  the completion of protamine infusion, all operative fields were inspected  for adequate hemostasis.  Once this had been assured, the groin incision  was closed in layers using absorbable suture.  The thoracic space was  closed by reapproximating the ribs using heavy Vicryl.  The overlying soft  tissue was closed in multiple layers using absorbable suture.  Sterile  dressings were applied.  All instrument and sponge counts were correct at  the end of the case.  The patient was then transported from the operating  room to the intensive care unit in stable condition.           D:  08/24/2016 15:45 PM  by Dr. Minerva Areola L. Garen Grams, MD ([id number])  T:  08/25/2016 00:59 AM by Netta Cedars      Everlean Cherry: [id number]) (Doc ID: [id number])

## 2016-08-25 NOTE — OT Progress Note (Signed)
Occupational Therapy Note  [facility]   Occupational Therapy Treatment     Patient: Glenda Green    MRN#: [id number]   [facility]  Bed: FI259/FI259-01      Discharge Recommendations:   Discharge Recommendation: Acute Rehab   DME Recommended for Discharge: Other (Comment) (TBD at rehab)    If Acute Rehab recommended discharge disposition is not available, patient will need min/mod assist for ADLs and OOB activities, WC, BSC equipment, and HHOT.     Assessment:   Pt alert t/o session today, following all commands. Pt required min/mod A for sit-stand and functional transfers w/RW. Pt able to stand at sink side for grooming w/CG A initially but then knees buckling and pt required min/mod A for standing. Pt will benefit from cont OT to max I and safety with ADLs and functional transfers.    Treatment Activities: self care, transfers    Educated the patient to role of occupational therapy, plan of care, goals of therapy and HEP, safety with mobility and ADLs, pursed lip breathing.    Plan:    OT Frequency Recommended: 3-4x/wk     Continue plan of care.       Precautions and Contraindications:   Falls   L MCA CVA on admission    Updated Medical Status/Imaging/Labs:  Reviewed     Subjective:"I feel better today but still weak"   Patient's medical condition is appropriate for Occupational Therapy intervention at this time.  Patient is agreeable to participation in the therapy session. Nursing clears patient for therapy.    Pain:   Scale: no c/o    Objective:   Patient is in bed with IV, tele in place. R hand edema, RN aware.      Cognition  Alert, follows commands    Functional Mobility  Rolling:  Min A  Supine to Sit: min A  Sit to Stand: min A  Transfers: min/mod A w/RW, difficulty advancing R LE    Balance  Static Sitting: sup at EOB  Dynamic Sitting: CG A  Static Standing: CG A initially but once fatigues, knees buckling and required min/mod A  Dynamic Standing: min/mod A  w/RW    Self Care and Home Management  Eating: I hand to mouth  Grooming: set up, CG A for standing initially but then knees buckling requiring min/mod A  UE Dressing: mod A gown  LE Dressing: max A   Toileting: CG A in sitting; max A in standing for management of clothing    Therapeutic Exercises  B UE w/mobilty    Participation: good  Endurance: fair; O2 sats WFL t/o session, +SOB    Patient left with call bell within reach, all needs met, SCDs off, fall mat in place, chair alarm on and all questions answered. RN notified of session outcome and patient response.     Goals:  Time For Goal Achievement: 5 visits  ADL Goals  Patient will groom self: at sinkside, Contact Guard Assist  Patient will dress upper body: Stand by Assist  Patient will dress lower body: Contact Guard Assist  Pt will complete bathing: Discontinued (comment)  Patient will toilet: Contact Guard Assist  Mobility and Transfer Goals  Pt will perform functional transfers: Contact Guard Assist, with rolling walker  Neuro Re-Ed Goals  Pt will perform dynamic sitting balance: Supervision  Pt will perform dynamic standing balance: Contact Guard Assist  Musculoskeletal Goals  Pt will demonstrate increase of strength: to increase  ability to complete ADLs (of B UE by 1/2 grade t/o)  Other Goal: increase activity tolerance to good  Executive Fucntion Goals  Pt will demonstrate safety: Discontinued (comment)                    Time of Treatment  OT Received On: 08/25/16  Start Time: 0950  Stop Time: 1015  Time Calculation (min): 25 min    Treatment # 1 of 5 visits    Signature: Felecia Shelling MS, OTR [phone]

## 2016-08-25 NOTE — Progress Notes (Signed)
[facility] Consult Progress Note    Date Time: 08/25/16   Patient Name: Glenda Green  Outpatient Neurologist: n/Green    CC: difficulty speaking    Assessment:   80 yo F visiting from [place] with h/o DM, HTN, HLD, on ASA 81mg , recurrent UTIs s/p renal stent 08/05/16 (was off ASA for it), found aphasic on AM of 08/08/16. Partial L MCA syndrome on arrival to [facility], with NIHSS 10. Was out of window for IV-tPA. No LVO on CTA.     1. Acute ischemic stroke - small acute infarct in L parietal cortex (MCA territory) without hemorrhage       Mechanism of stroke is embolic - suspected to be cardioembolic:      TTE with possible myxoma --> TEE with large mass (myxoma reported) with Green small atrial shunt        LDL 105, A1c 7.7%     Aphasia, mild R hemiparesis, L gaze preference have all resolved     2. Right atrial mass, PFO      Resected 9/29 and PFO closed. Frozen path showed lipoma.       Presumed to be the source/mechanism of her stroke, but an alternative consideration is that these were incidental and his stroke is related to occult Afib    3. Recent renal stent 08/05/16     4. H/o DM, HTN, HLD    Recommendations:   - Continue home medications  - Continue statin for secondary stroke prevention.  Plavix held for cardiac surgery but will be resumed when wires removed.  - Continue medical management by primary team  - Supportive therapies:  PT/OT/SLP  - Will follow as needed    Neurology Attending Addendum:    I have personally reviewed the interval history, images and pertinent test results and I have personally examined the patient and confirmed the major physical findings of the preceding NP/PA note.  Also, I have noted any changes since the note was written as well as added additional findings and recommendations.    Glenda Green was seen for neurological follow-up and is stable.  .  There have been no headaches, nausea, vomiting, or weakness described.  She is been able to do some ambulation as well.  .  Current management  as outlined by all treating physicians and [facility] neurolog[facility].  She is clear for discharge when medically stable.    Sinclair Ship, MD  [facility] Neurology  Neuro-hospitalist  [facility]  [employer] [phone]        Interval History/Subjective:   No acute neurological events reported or documented overnight.   Pt states that she had just walked down the hallway Green little ways. She feels that she is recovering from surgery as does her daughter in law. She denies headaches, N/V, weakness or paresthesias. She denies any further speech issues.     Daughter in law did feel that her right side looked slightly weaker while she was walking.       Medications:     Current Facility-Administered Medications   Medication Dose Route Frequency   . amiodarone  200 mg Oral Q12H SCH   . amLODIPine  2.5 mg Oral Daily   . aspirin EC  81 mg Oral Daily   . atorvastatin  20 mg Oral QHS   . clopidogrel  75 mg Oral Daily   . DULoxetine  60 mg Oral Daily   . heparin (porcine)  5,000 Units Subcutaneous Q8H SCH   .  insulin glargine  8 Units Subcutaneous QHS   . metoprolol  25 mg Oral Q12H SCH   . mupirocin   Nasal Q12H SCH   . polyethylene glycol  17 g Oral Daily   . senna-docusate  2 tablet Oral BID   . sodium chloride (PF)  3 mL Intravenous Q8H       Review of Systems:   All systems reviewed and are negative, except for those mentioned above.     Physical Exam:   Temp:  [98.2 F (36.8 C)-98.6 F (37 C)] 98.4 F (36.9 C)  Heart Rate:  [60-75] 75  Resp Rate:  [16-18] 16  BP: (131-158)/(65-81) 144/81    Vital Signs: Reviewed.    General: well developed, well nourished elderly woman in no acute distress. Cooperative with exam.  Follows commands appropriately   Extremities: no pedal edema, extremities normal in color    Mental Status: Patient was awake, alert and oriented. Affect is normal.   Able to identify daughter-in-law at bedside.   Fund of knowledge is slightly reduced  Recent and remote memory appear slightly  reduced  Language function is normal.  There is no evidence of aphasia and conversational speech.      Cranial nerves:   -CN III, IV, VI: Gaze midline, conjugate. No gaze preference. EOMI.   -CN V: Facial sensation intact   -CN VII: Very mild left nasolabial fold flattening  -CN VIII: Hearing intact to conversational speech.  -CN IX, X: Normal phonation  -CN XI: Symmetric strength of trapezius muscles  -CN XII: Tongue protrudes midline.    Motor: Muscle bulk normal. Muscle tone normal. Strength seems to be 5/5 throughout. Unable to test RUE 2/2 IV issue.     Sensory: Light touch intact  Temperature intact  Vibration reduced to ankle level of BLE    Reflexes: Toes downgoing on right.  Patient's left great toe is absent. DTRs 0 in BLE and 1+in BUE.     Coordination: FTN intact. RAM intact. No tremors.     Gait: Deferred    Labs:   Labs reviewed. Managed by primary team.     Results     Procedure Component Value Units Date/Time    Glucose Whole Blood - POCT [[id number]]  (Abnormal) Collected:  08/25/16 0604     Updated:  08/25/16 0606     POCT - Glucose Whole blood 138 (H) mg/dL     Basic Metabolic Panel [[id number]]  (Abnormal) Collected:  08/25/16 0356    Specimen:  Blood Updated:  08/25/16 0457     Glucose 135 (H) mg/dL      BUN 24.4 (H) mg/dL      Creatinine 1.7 (H) mg/dL      Calcium 8.4 mg/dL      Sodium 010 (L) mEq/L      Potassium 3.8 mEq/L      Chloride 101 mEq/L      CO2 26 mEq/L     Magnesium [[id number]] Collected:  08/25/16 0356    Specimen:  Blood Updated:  08/25/16 0457     Magnesium 1.9 mg/dL     GFR [[id number]] Collected:  08/25/16 0356     Updated:  08/25/16 0457     EGFR 29.0    CBC without differential [[id number]]  (Abnormal) Collected:  08/25/16 0356    Specimen:  Blood from Blood Updated:  08/25/16 0428     WBC 12.88 (H) x10 3/uL      Hgb 10.3 (L) g/dL  Hematocrit 32.2 (L) %      Platelets 192 x10 3/uL      RBC 3.65 (L) x10 6/uL      MCV 88.2 fL      MCH 28.2 pg      MCHC 32.0 g/dL      RDW  15 %      MPV 10.8 fL      Nucleated RBC 0.0 /100 WBC      Absolute NRBC 0.00 x10 3/uL     Glucose Whole Blood - POCT [[id number]]  (Abnormal) Collected:  08/24/16 2120     Updated:  08/24/16 2132     POCT - Glucose Whole blood 260 (H) mg/dL     Glucose Whole Blood - POCT [[id number]]  (Abnormal) Collected:  08/24/16 1622     Updated:  08/24/16 1701     POCT - Glucose Whole blood 210 (H) mg/dL     Magnesium [[id number]] Collected:  08/24/16 1230    Specimen:  Blood Updated:  08/24/16 1326     Magnesium 1.7 mg/dL     GFR [[id number]] Collected:  08/24/16 1230     Updated:  08/24/16 1326     EGFR 27.2    Basic Metabolic Panel [[id number]]  (Abnormal) Collected:  08/24/16 1230    Specimen:  Blood Updated:  08/24/16 1326     Glucose 216 (H) mg/dL      BUN 40.3 (H) mg/dL      Creatinine 1.8 (H) mg/dL      Calcium 8.3 mg/dL      Sodium 474 (L) mEq/L      Potassium 4.5 mEq/L      Chloride 99 (L) mEq/L      CO2 25 mEq/L     CBC without differential [[id number]]  (Abnormal) Collected:  08/24/16 1231    Specimen:  Blood from Blood Updated:  08/24/16 1311     WBC 17.22 (H) x10 3/uL      Hgb 10.4 (L) g/dL      Hematocrit 64.3 (L) %      Platelets 189 x10 3/uL      RBC 3.62 (L) x10 6/uL      MCV 89.5 fL      MCH 28.7 pg      MCHC 32.1 g/dL      RDW 15 %      MPV 11.0 fL      Nucleated RBC 0.0 /100 WBC      Absolute NRBC 0.00 x10 3/uL     Glucose Whole Blood - POCT [[id number]]  (Abnormal) Collected:  08/24/16 1158     Updated:  08/24/16 1241     POCT - Glucose Whole blood 210 (H) mg/dL     UA, Reflex to Microscopic [[id number]] Collected:  08/24/16 1231    Specimen:  Urine Updated:  08/24/16 1231          Rads:   Xr Chest 2 Views    Result Date: 08/20/2016    Negative for acute process Gerlene Burdock, MD 08/20/2016 6:20 PM     Nm Kidney W Flow And Function Mag3 W Medc    Result Date: 08/18/2016  1. Progressive clearance by right kidney with persistent tracer accumulation in renal pelvis with prolonged Lasix half clearance time in the  obstructive range. No complete obstruction. Patulous collecting system can blunt Lasix response. 2. No scintigraphic evidence of urinary obstruction on the left. 3. Split renal function left 54% and right 46%.  Clide Cliff, MD 08/18/2016 5:10 PM     X-ray Chest Ap Portable (daily)    Result Date: 08/23/2016   1. No change in lines and tubes. 2. Minimal basilar atelectasis. Negative for pneumothorax. Elizebeth Koller, MD 08/23/2016 8:20 AM     X-ray Chest Ap Portable (daily)    Result Date: 08/22/2016   Postop change in the chest with hypoventilatory change and atelectasis. Geanie Cooley, MD 08/22/2016 8:35 AM     X-ray Chest Ap Portable (once)    Result Date: 08/21/2016  Postoperative changes. Hypoaeration. Latanya Maudlin, MD 08/21/2016 2:23 PM         Signed by:  Barnie Mort, PA-C  Physician Assistant  [facility]  Daytime: [employer] [phone]  Consult requests: [employer] [phone]  After 5:00 pm: [phone]    Please see attending Neurologist note that accompanies this mid-level encounter note.

## 2016-08-25 NOTE — Progress Notes (Signed)
Reviewed the following with patient and family: FOLLOW UP APPOINTMENTS, CARDIOLOGIST, PRIMARY CARE PHYSICIAN, PRESCRIPTIONS AND THEIR COMMON SIDE EFFECTS, MEDICATION ADMINISTRATION GUIDE WITH WHEN NEXT DOSES DUE, INCISION CARE, SYMPTOMS OF INFECTION, DAILY WEIGHT IN MORNING, INCENTIVE SPIROMETRY, ACTIVITY RESTRICTIONS, AMBULATION, TEMPERATURE, PULSE AND BLOOD PRESSURE CHECK, AND CARDIAC REHABILITATION.  ALSO REVIEWED WITH THE PATIENT WHEN TO SEEK FURTHER TREATMENT.      Patient being discharged to [facility] rehab and then patient will go back to [facility].   Will find a cardiac rehab place close to home.    Patient's family present at discharge and verbalized understanding of instructions.

## 2016-08-25 NOTE — Progress Notes (Signed)
[facility], [employer].  Progress Note    Assessment:    -AKI due to hemodynamics and fluid status; serum creatinine 1.5 mg/dL;   -DM with CKD; probable diabetic nephropathy   -Nephrotic range proteinuria; possible due to diabetes but we'll rule out other etiologies   -Right atrial mass; plan for LHC on Monday   -CVA  -History of bilateral hydronephrosis; per urology no evidence of obstruction or hydro at this time     Plan:    -Cr stable.  -supportive care   -labs   -continue to document UOP  -avoid nephrotoxins   -Faint IgG lambda protein spike. She can be followed up as an outpatient by hematology  -She lives in [place] and planning to go back. So can follow up as an out patient with nephrology there.        Simonne Maffucci, MD  Office - [phone]  [employer] - [phone]  ++++++++++++++++++++++++++++++++++++++++++++++++++++++++++++++  Subjective:  No new complaints    Medications:  Scheduled Meds:  Current Facility-Administered Medications   Medication Dose Route Frequency   . amiodarone  200 mg Oral Q12H SCH   . amLODIPine  2.5 mg Oral Daily   . aspirin EC  81 mg Oral Daily   . atorvastatin  20 mg Oral QHS   . clopidogrel  75 mg Oral Daily   . DULoxetine  60 mg Oral Daily   . heparin (porcine)  5,000 Units Subcutaneous Q8H SCH   . insulin glargine  8 Units Subcutaneous QHS   . insulin lispro  4 Units Subcutaneous TID AC   . metoprolol  25 mg Oral Q12H SCH   . mupirocin   Nasal Q12H SCH   . polyethylene glycol  17 g Oral Daily   . senna-docusate  2 tablet Oral BID   . sodium chloride (PF)  3 mL Intravenous Q8H     Continuous Infusions:     PRN Meds:acetaminophen, benzocaine-menthol, bisacodyl, dextrose, glucagon (rDNA), insulin lispro, insulin lispro, ondansetron **OR** ondansetron, traMADol    Objective:  Vital signs in last 24 hours:  Temp:  [98 F (36.7 C)-98.6 F (37 C)] 98 F (36.7 C)  Heart Rate:  [67-75] 73  Resp Rate:  [16-18] 16  BP: (121-158)/(65-81) 121/68  Intake/Output  last 24 hours:    Intake/Output Summary (Last 24 hours) at 08/25/16 1550  Last data filed at 08/24/16 2300   Gross per 24 hour   Intake              300 ml   Output                0 ml   Net              300 ml     Intake/Output this shift:  No intake/output data recorded.    Physical Exam:   Gen: WD WN NAD   CV: S1 S2 N RRR   Chest: Crackles    Ab: ND NT soft no HSM +BS   Ext: No C/E    Labs:    Recent Labs  Lab 08/25/16  0356 08/24/16  1230 08/23/16  0643 08/22/16  0157 08/22/16  0156  08/21/16  0451   Glucose 135* 216* 105*  --  153* More results in Results Review 145*   BUN 35.0* 36.0* 38.0*  --  25.0* More results in Results Review 29.0*   Creatinine 1.7* 1.8* 1.5*  --  1.4* More results in  Results Review 1.6*   Calcium 8.4 8.3 8.9  --  8.8 More results in Results Review 8.9   Sodium 135* 133* 137  --  141 More results in Results Review 141   Potassium 3.8 4.5 4.2  --  5.2* More results in Results Review 5.2*   Chloride 101 99* 105  --  111 More results in Results Review 112*   CO2 26 25 24   --  24 More results in Results Review 20*   Albumin  --   --   --  3.8  --   --   --    Phosphorus  --   --  4.0  --  4.2  --  4.7   Magnesium 1.9 1.7 2.0  --  2.6 More results in Results Review 1.8   More results in Results Review = values in this interval not displayed.    Recent Labs  Lab 08/25/16  0356 08/24/16  1231 08/23/16  0449   WBC 12.88* 17.22* 18.92*   Hgb 10.3* 10.4* 10.2*   Hematocrit 32.2* 32.4* 32.4*   MCV 88.2 89.5 90.5   MCH 28.2 28.7 28.5   MCHC 32.0 32.1 31.5*   RDW 15 15 16*   MPV 10.8 11.0 11.6   Platelets 192 189 196

## 2016-08-26 ENCOUNTER — Inpatient Hospital Stay: Payer: Medicare Other

## 2016-08-26 LAB — COMPREHENSIVE METABOLIC PANEL
ALT: 17 U/L (ref 0–55)
AST (SGOT): 10 U/L (ref 5–34)
Albumin/Globulin Ratio: 1 (ref 0.9–2.2)
Albumin: 3 g/dL — ABNORMAL LOW (ref 3.5–5.0)
Alkaline Phosphatase: 82 U/L (ref 37–106)
Anion Gap: 9 (ref 5.0–15.0)
BUN: 34 mg/dL — ABNORMAL HIGH (ref 7–19)
Bilirubin, Total: 0.4 mg/dL (ref 0.2–1.2)
CO2: 27 mEq/L (ref 22–29)
Calcium: 8.9 mg/dL (ref 7.9–10.2)
Chloride: 102 mEq/L (ref 100–111)
Creatinine: 1.8 mg/dL — ABNORMAL HIGH (ref 0.6–1.0)
Globulin: 3.1 g/dL (ref 2.0–3.6)
Glucose: 105 mg/dL — ABNORMAL HIGH (ref 70–100)
Potassium: 4.5 mEq/L (ref 3.5–5.1)
Protein, Total: 6.1 g/dL (ref 6.0–8.3)
Sodium: 138 mEq/L (ref 136–145)

## 2016-08-26 LAB — CBC AND DIFFERENTIAL
Absolute NRBC: 0 10*3/uL
Basophils Absolute Automated: 0.02 10*3/uL (ref 0.00–0.20)
Basophils Automated: 0.2 %
Eosinophils Absolute Automated: 0.27 10*3/uL (ref 0.00–0.70)
Eosinophils Automated: 2.5 %
Hematocrit: 34.9 % — ABNORMAL LOW (ref 37.0–47.0)
Hgb: 11.2 g/dL — ABNORMAL LOW (ref 12.0–16.0)
Immature Granulocytes Absolute: 0.22 10*3/uL — ABNORMAL HIGH
Immature Granulocytes: 2 %
Lymphocytes Absolute Automated: 1.45 10*3/uL (ref 0.50–4.40)
Lymphocytes Automated: 13.5 %
MCH: 28.2 pg (ref 28.0–32.0)
MCHC: 32.1 g/dL (ref 32.0–36.0)
MCV: 87.9 fL (ref 80.0–100.0)
MPV: 10.7 fL (ref 9.4–12.3)
Monocytes Absolute Automated: 1.06 10*3/uL (ref 0.00–1.20)
Monocytes: 9.9 %
Neutrophils Absolute: 7.72 10*3/uL (ref 1.80–8.10)
Neutrophils: 71.9 %
Nucleated RBC: 0 /100 WBC (ref 0.0–1.0)
Platelets: 246 10*3/uL (ref 140–400)
RBC: 3.97 10*6/uL — ABNORMAL LOW (ref 4.20–5.40)
RDW: 15 % (ref 12–15)
WBC: 10.74 10*3/uL (ref 3.50–10.80)

## 2016-08-26 LAB — GLUCOSE WHOLE BLOOD - POCT
Whole Blood Glucose POCT: 117 mg/dL — ABNORMAL HIGH (ref 70–100)
Whole Blood Glucose POCT: 330 mg/dL — ABNORMAL HIGH (ref 70–100)
Whole Blood Glucose POCT: 308 mg/dL — ABNORMAL HIGH (ref 70–100)
Whole Blood Glucose POCT: 131 mg/dL — ABNORMAL HIGH (ref 70–100)

## 2016-08-26 LAB — GFR: EGFR: 27.2

## 2016-08-26 MED ORDER — POLYETHYLENE GLYCOL 3350 17 G PO PACK
17.0000 g | PACK | Freq: Every day | ORAL | Status: DC | PRN
Start: 2016-08-26 — End: 2016-09-04

## 2016-08-26 MED ORDER — HYDRALAZINE HCL 25 MG PO TABS
25.0000 mg | ORAL_TABLET | Freq: Four times a day (QID) | ORAL | Status: DC | PRN
Start: 2016-08-26 — End: 2016-09-04

## 2016-08-26 MED ORDER — SENNA 8.6 MG PO TABS
17.2000 mg | ORAL_TABLET | Freq: Every evening | ORAL | Status: DC | PRN
Start: 2016-08-26 — End: 2016-09-04

## 2016-08-26 NOTE — Rehab IRF Data Coll Rights Priv Act ([employer]) (Signed)
Glenda Green  MRN: [id number]  Account: [id number]  Session Start: 08/26/2016 12:00:00 AM  Session Stop: 08/26/2016 12:00:00 AM      IRFPPS Data Collection Rights and Rehab Privacy Act Notice: Patient received a  copy of the IRF-PAI Data Collection Rights and Rehab Privacy Act Notice.    Signed by: Karl Pock, Administrative Coordinator 08/26/2016 7:55:00 AM

## 2016-08-26 NOTE — H&P (Signed)
IRF Post-Admission Assessment  History and Physical    Date of admission: 08/25/2016    Reason for admission:  Dysfunction of mobility and ADLs due to left MCA stroke and cardiac debility   Old medical records, laboratory and imaging test results have been reviewed    History of present illness:     This 80 y.o. year old female with history of hypertension, hyperlipidemia, type 2 diabetes mellitus, chronic kidney disease, partial left foot amputation in 2015, coronary artery disease, coronary artery bypass graft 2010 was admitted to [facility] on 08/08/16 with complains of difficulty speaking.  Brain MRI showed acute left MCA stroke.  Plavix was started by neurology.   Hospital course was notable for  -.  Right axial myxoma: The patient underwent cardiac catheterization and right thoracotomy for axial septal mass excision, and PF0 closure on on 08/21/16.  Cardiology recommended to continue amiodarone for 11 days and stop on 09/05/16  -Urinary tract infection: The patient completed course of IV ceftriaxone.  -  Obstructive uropathy, status post ureteric stenting in September 2017.  Heparin was started for deep vein thrombosis prophylaxis  On 08/25/16   , the patient was deemed medically stable to be discharged to acute inpatient rehabilitation.   Currently, the patient complaints of fatigue    Functional history:  Premorbidly, the patient was modified independent  with mobility and independent with ADLs.  At admission, the patient status is as follows:  Sit to Stand: min A  Stand to Sit: min A    Ambulation  Level of Assistance required: min A  Ambulation Distance: 40' x 3 times  Pattern: decreased R foot clearance, fatigues quickly  Grooming: set up, CG A for standing initially but then knees buckling requiring  min/mod A  UE Dressing: mod A gown  LE Dressing: max A  Toileting: CG A in sitting; max A     Past Medical History:   Past Medical History:   Diagnosis Date   . Arthritis    . Coronary artery disease     . Depression    . Difficulty walking     unsteady gait uses walker/cane/wheelchair   . Hyperlipidemia    . Hypertension    . Kidney disorder    . Type 2 diabetes mellitus, controlled    . Urinary tract infection     multiple time on Nitrofurantion maintaining dose     Social History:  Living arrangements: Patient  lives in basement of sons house in [place] with kitchenette.Has been staying with daughter/son in [place] for past month and will discharge  there. 2 level SFH with one step to enter, no railing. Is able to remain on  entry level of home if needed.  Family is supportive and can assist as needed.  DME prior to admission: Four wheel walker, Front wheel walker, Wheelchair-manual  (shower chair)  Family History:   Family History   Problem Relation Age of Onset   . Diabetes type II Mother        Allergies:   Allergies   Allergen Reactions   . Tape Itching       Medications:reviewed and reconciled     Current Facility-Administered Medications   Medication Dose Route Frequency   . amiodarone  200 mg Oral BID   . amLODIPine  2.5 mg Oral Daily   . aspirin EC  81 mg Oral Daily   . atorvastatin  20 mg Oral QHS   . clopidogrel  75 mg Oral Daily   .  docusate sodium  100 mg Oral BID   . DULoxetine  60 mg Oral Daily   . heparin (porcine)  5,000 Units Subcutaneous Q12H [facility]   . insulin glargine  8 Units Subcutaneous QHS   . metoprolol  25 mg Oral Q12H [facility]   . mupirocin   Nasal BID           Review of systems:  Pertinent positives OZH:[id number] with mobility, difficulty with activities of daily living are right-sided weakness  A full review of systems was obtained and was otherwise negative.    Physical Examination:   BP 179/84   Pulse 64   Temp (!) 96.8 F (36 C)   Resp 18   Ht 1.626 m (5\' 4" )   Wt 74.6 kg (164 lb 7.4 oz)   SpO2 99%   BMI 28.23 kg/m     General appearance: Appears well.  In no acute distress. Normal body habitus.  Eyes:Anicteric sclerae. Conjunctivae non-injected. EOMI.  HENT:Symmetric facies.  Hearing grossly intact. Moist mucous membranes.  EX:BMWUX rate and rhythm are regular. No significant lower limb edema.  Pulm:No wheezes, rales, or rhonchi. Decreased breath sounds at both bases.  LKG:MWNU. Non-tender. Normoactive bowel sounds.  Skin: Right lateral thoracic Incision with sutures, no drainage or erythema   Neuro: Alert, oriented to name, location, time and circumstances.  Word finding deficits  PERRL , EOMI. No nystagmus.   Face is symmetric and without weakness.   Sensation of the face intact.  Speech fluent, the uvula is midline, and the tongue moves normally  Hearing intact bilaterally to conversational speech.   .Sensation to LT is intact  Musculoskeletal:     ROM of the right shoulder is limited by pain         Manual muscle strength testing:  Muscle group Left Right   Shoulder abductors 5 Invalid due to pain   Elbow flexors 5 3   Elbow extensors 5 4   Finger flexors 5 4   Hip flexors 4 4   Knee flexors 5 4   Knee extensors 5 4   Ankle dorsiflexors 5 5   Ankle plantarflexors 5 5   EHL 5 5     Psych:Pleasant and cooperative Normal mood and affect  Extremities: Left foot: Amputation of the 1-3 toes      Cognitive exam:INPATIENT REHABILITATION FACILITY - PATIENT ASSESSMENT INSTRUMENT QUALITY INDICATORS       Section C.  Cognitive Patterns.    C0100. Should Brief Interview for Mental Status (C0200-C0500) be conducted? (3-day assessment period) Attempt to conduct interview with all patients.      0. No (patient is rarely/never understood) Skip to C0900. Memory/Recall Ability   1. Yes Continue to C0200. Repetition of Three Words   Yes     Brief Interview for Mental Status (BIMS)    C0200. Repetition of Three Words      Ask patient: "I am going to say three words for you to remember. Please repeat the words after I have said all three. The words are: sock, blue and bed. Now tell me the three words."    Number of words repeated by patient after first attempt:   3. Three   2. Two   1. One   0. None      Three    After the patient's first attempt say: "I will repeat each of the three words with a cue and ask you about them later: sock, something to wear; blue, a color;  bed, a piece of furniture." You may repeat the words up to two more times             Brief Interview for Mental Status (BIMS) - Continued    C0300. Temporal Orientation: Year, Month, Day      A. Ask patient: "Please tell me what year it is right now." Patient's answer is:   3. Correct   2. Missed by 1 year   1. Missed by 2 to 5 years   0. Missed by more than 5 years or no answer     Correct       B. Ask patient: "What month are we in right now?" Patient's answer is:   2. Accurate within 5 days   1. Missed by 6 days to 1 month  0. Missed by more than 1 month or no answer     Accurate within 5 days       C. Ask patient: "What day of the week is today?" Patient's answer is:   1. Correct   0. Incorrect or no answer     Correct     C0400. Recall    Ask patient: "Let's go back to the first question. What were those three words that I asked you to repeat?" If unable to remember a word, give cue (i.e., something to wear; a color; a piece of furniture) for that word.     A. Recalls "sock?"   2. Yes, no cue required  1. Yes, after cueing ("something to wear")  0. No, could not recall     Yes, no cue required     B. Recalls "blue?"   2. Yes, no cue required   1. Yes, after cueing ("a color")  0. No, could not recall     Yes, after cueing ("a color")     C. Recalls "bed?"  2. Yes, no cue required   1. Yes, after cueing ("a piece of furniture")   0. No, could not recall     Yes, no cue required   C0500. BIMS Summary Score.      Select "Yes", if the patient was unable to complete the interview.  Select "No", if the patient was able to complete the interview.   Yes     C0600. Should the Staff Assessment for Mental Status [phone]) be Conducted?      0. No (patient was able to complete Brief Interview for Mental Status)   1. Yes (patient was unable to complete  Brief Interview for Mental Status) Continue to C0900. Memory/Recall Ability     Yes (patient was unable to complete Brief Interview for Mental Status)       Staff Assessment for Mental Status.    Do not conduct if Brief Interview for Mental Status (C0200-C0500) was completed.    C0900. Memory/Recall Ability.    Check all that the patient was normally able to recall    A. Current season    B. Location of own room    C. Staff names and faces    E. That he or she is in a hospital/hospital unit    Z. None of the above were recalled     Current Season and That he or she is in a hospital/hospital unit         Labs:     Results     Procedure Component Value Units Date/Time    Glucose Whole Blood - POCT [[id number]]  (Abnormal) Collected:  08/26/16 1702  Updated:  08/26/16 1720     POCT - Glucose Whole blood 308 (H) mg/dL     Glucose Whole Blood - POCT [[id number]]  (Abnormal) Collected:  08/26/16 1104     Updated:  08/26/16 1107     POCT - Glucose Whole blood 330 (H) mg/dL     Comprehensive metabolic panel [[id number]]  (Abnormal) Collected:  08/26/16 0446    Specimen:  Blood Updated:  08/26/16 0652     Glucose 105 (H) mg/dL      BUN 34 (H) mg/dL      Creatinine 1.8 (H) mg/dL      Sodium 621 mEq/L      Potassium 4.5 mEq/L      Chloride 102 mEq/L      CO2 27 mEq/L      Calcium 8.9 mg/dL      Protein, Total 6.1 g/dL      Albumin 3.0 (L) g/dL      AST (SGOT) 10 U/L      ALT 17 U/L      Alkaline Phosphatase 82 U/L      Bilirubin, Total 0.4 mg/dL      Globulin 3.1 g/dL      Albumin/Globulin Ratio 1.0     Anion Gap 9.0    GFR [[id number]] Collected:  08/26/16 0446     Updated:  08/26/16 0652     EGFR 27.2    CBC and differential [[id number]]  (Abnormal) Collected:  08/26/16 0446    Specimen:  Blood from Blood Updated:  08/26/16 0618     WBC 10.74 x10 3/uL      Hgb 11.2 (L) g/dL      Hematocrit 32.4 (L) %      Platelets 246 x10 3/uL      RBC 3.97 (L) x10 6/uL      MCV 87.9 fL      MCH 28.2 pg      MCHC 32.1 g/dL      RDW 15 %       MPV 10.7 fL      Neutrophils 71.9 %      Lymphocytes Automated 13.5 %      Monocytes 9.9 %      Eosinophils Automated 2.5 %      Basophils Automated 0.2 %      Immature Granulocyte 2.0 %      Nucleated RBC 0.0 /100 WBC      Neutrophils Absolute 7.72 x10 3/uL      Abs Lymph Automated 1.45 x10 3/uL      Abs Mono Automated 1.06 x10 3/uL      Abs Eos Automated 0.27 x10 3/uL      Absolute Baso Automated 0.02 x10 3/uL      Absolute Immature Granulocyte 0.22 (H) x10 3/uL      Absolute NRBC 0.00 x10 3/uL     Glucose Whole Blood - POCT [[id number]]  (Abnormal) Collected:  08/26/16 0454     Updated:  08/26/16 0517     POCT - Glucose Whole blood 117 (H) mg/dL     Urine culture [[id number]] Collected:  08/25/16 2214    Specimen:  Urine from Urine, Clean Catch Updated:  08/26/16 0251    Urinalysis with microscopic [[id number]]  (Abnormal) Collected:  08/25/16 2215    Specimen:  Urine Updated:  08/25/16 2255     Urine Type Clean Catch     Color, UA Amber (A)     Clarity, UA Turbid (A)  Specific Gravity UA 1.011     Urine pH 6.0     Leukocyte Esterase, UA Large (A)     Nitrite, UA Negative     Protein, UR 100 (A)     Glucose, UA 150 (A)     Ketones UA Negative     Urobilinogen, UA Negative mg/dL      Bilirubin, UA Negative     Blood, UA Moderate (A)     RBC, UA TNTC (A) /hpf      WBC, UA TNTC (A) /hpf      Squamous Epithelial Cells, Urine 0 - 5 /hpf      Trans Epithel, UA 0 - 2 /hpf      WBC Clumps, UA Many (A) /hpf     Glucose Whole Blood - POCT [[id number]]  (Abnormal) Collected:  08/25/16 2117     Updated:  08/25/16 2128     POCT - Glucose Whole blood 300 (H) mg/dL            Imaging reports reviewed:    Echocardiogram Adult Transesophageal W Color Doppler  Waveform:  SUMMARY:  Left ventricle:-  Size was normal.-  Systolic function was normal. Ejection  fraction was estimated to be 55 %.-  There were no regional wall motion  abnormalities.-  There was hypokinesis of the basal anteroseptal wall(s).-  Wall  thickness was  normal.    Aortic valve:-  The valve was trileaflet. Leaflets exhibited moderately  increased thickness, moderate calcification, and moderately reduced cuspal  separation.  Transaortic velocity was within the normal range.There was moderate stenosis.  There was no regurgitation. There was no evidence for vegetation.    Aorta, systemic arteries:-  There was mild atheroma.  Mitral valve:-  There was trivial regurgitation.    Left atrial appendage:-  The appendage was small.-  No thrombus was identified.  Atrial septum:-  There was a probable small atrial septal defect.-  No defect or  patent foramen ovale was identified.-  Doppler evaluation was performed. There  was a right-to-left shunt, in the baseline state.    Right ventricle:-  Systolic function was reduced.  Pulmonic valve:-  There was no significant regurgitation.  Right atrium:-  The RA cavity is almost entirely occupied by a large (At least  5.5 x 3.5  cm), echodense, somewhat speckled-appearing echogenic mass. The point of  attcahment is not well seen but it is most closely associated with the superior  aspects of the RA. It does not directly overlay the tricuspid annulus, nor does  it prolapse across the tricuspid orifice during diastole. The visual appearance  is most consistent with a large right atrial myxoma.    Pericardium:-  There was no pericardial effusion.    08/13/16 CT Abdomen Pelvis W PO Contrast Only  No evidence of metastatic disease  on this noncontrast study. Bilateral hydronephrosis. Right ureteral stent in  place. Fatty lesion in the inferior to the left kidney of uncertain etiology.  Findings may be due to fat necrosis however liposarcoma is not excluded.    08/13/16 CT Chest WO Contrast: 1. Irregular opacity at the right lung base  medially. Findings may be due to scarring however metastatic disease is not  excluded. Follow-up in the prior studies are available then comparison to prior  studies is  recommended..2. Sclerotic  lesion left T4 pedicle. This is indeterminate.  Metastatic  disease is not excluded.    Ct Angiogram Head 08/08/2016: Possible very subtle perfusion abnormality about  the  posterior aspect of left MCA territory. No definite thrombosis or definite  penumbra. 2.  Lucency evident involving the right medial occipital cortex and  the left cerebellar cortex, consistent with ischemia, age-indeterminate but  likely old. 3.  Atherosclerotic stenosis as noted above.    Ct Head Wo Contrast  08/08/2016:  Lucency of the left cerebellar cortex in the  right medial occipital cortex, findings consistent with infarction, age  indeterminant but likely old.    Mri Brain Wo Contrast 08/09/2016: Small, acute cortical infarct involving a  single left parietal gyrus without mass effect or hemorrhage. 2. Generalized  cerebral volume loss and advanced sequela of chronic microvascular change. 3.  Chronic left cerebellar infarct. 4. Limited exam  Assessment:     80 year old right-handed female with mobility/ADL dysfunction due to left MCA stroke.  Cardiac debility.  Impaired balance  Partial amputation of the left foot     Patient Active Problem List   Diagnosis   . Hydronephrosis of right kidney   . Cerebrovascular accident (CVA), unspecified mechanism   . Atrial myxoma   . Bilateral hydronephrosis   . UTI (urinary tract infection)   . AKI (acute kidney injury)   . Acute ischemic left MCA stroke       [x]  Requires an intensive inpatient rehabilitation program with multidisciplinary therapies, rehab nursing, and close physician management.    [x]  The following co-morbidities may complicate rehabilitation:  Abnormal urine analysis, wounds, right shoulder pain    [x]  Is at risk for the following complications:  Injurious falls, Pneumonia, Urinary tract infection, Venous thromboembolic disease, Recurrent stroke, Arrhythmia, Hypoglycemia, Limb loss, Pressure sores and Wound infection         Individualized  Plan of Care:    - REHAB:   Begin  comprehensive and intensive inpatient rehab program, including:  Physical therapy 60-120 min daily, 5-6 times per week  Occupational therapy  60-120 min daily, 5-6 times per week  Speech therapy  60-120 min daily, 5-6 times per week  Case management  Rehabilitation nursing    Will work in an interdisciplinary manner to address the following impairments and issues:   Mobility, ADLs, Impaired strength, Impaired ROM, Impaired endurance, Adherence to precautions, Caregiver training, Wounds, Medication management, Adjustment to disability, Impaired coordination and Impaired balance      Requires 24h rehabilitation nursing to address:  Bladder care, Bowel care, Glucose control, Medication teaching, Nutrition, Positioning, Safety and Wounds    Anticipate a discharge to:  Home with assistance    Estimated length of stay: 09/04/16    Goals for discharge:  Bed mobility Modified independence    Transfers Modified independence    Locomotion Modified independence    Upper body dressing Modified independence    Lower body dressing Minimal assistance    Bathing Supervision    Toileting Supervision    Communication Use compensatory strategies to express needs and wants     Swallow Tolerate least restrictive oral diet without signs or symptoms of aspiration     Cognition Use compensatory strategies appropriately to compensate          Rehab potential: Good    Prognosis: Good    Potential limitations:Challenging home environment    Precautions: fall    Co-morbid conditions that require management/monitoring:  mobility/ADL dysfunction due to left MCA stroke: PT, OT, speech therapy.  Secondary stroke prevention: Aspirin, Plavix  Cardiac debility, status post right thoracotomy for myxoma excision, and PFO closure: We will continue amiodarone, until 09/05/16  Hypertension: Norvasc, metoprolol.  Impaired balance: Balance training  Partial amputation of the left foot    Pain : Tylenol  DVT ppx: Heparin      Review of Pre-admission  assessment  [x]  I have reviewed the nurse liaison's pre-admission assessment in [employer].   I do not note any significant changes at this time and agree with patient's appropriateness for intensive inpatient rehab program.      Hendricks Limes, DO

## 2016-08-26 NOTE — Rehab Evaluation ([facility]) (Signed)
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 Found by family in her room  struggling to put on her shirt, not speaking or responding properly. She was  outside window for lytics.  Past Medical History of DM2, HTN, HLD, CAD with CABG, and Recurrent UTIs (s/p  Renal Stent 08/05/16).    Neurological: Acute CVA: with mild right sided deficits, cardioembolic in  etiology    Cardiac workup: Found to have Large RA mass occupying much of the RA and  Possible interatrial shunt  Right Atrial Myxoma found on Echo and confirmed on TEE.  TEE confirms: Large RA mass taking up most or FRA cavity but not obstructing or  prolapsing across the TV orifice. Size: at least 5.5 cm in height and 3.5 cm in  width.  Cardiac Cath was completed and patient cleared for surgery  Now s/p atrial septal mass excision via right thoracotomy and PFO closure on  08/21/16    Cardiovascular:SR 80's EF: 65%, Moderate Aortic Stenosis  CAD s/p 4V-CABG in 2010 [facility], [place])  Left Heart Cath 08/17/16 - 3/4 bypass grafts patent with chronic occlusion of RCA  and right coronary bypass graft - stable anatomy.  HTN: Continue Metoprolol, norvasc added due to episode of  hypertention  overnight.  Plavix started per neurology recommendations.  Core Measures: Aspirin - Y; Beta blocker - Y; ACE - N; Statin - Y    Pulmonary: Weaned to RA  Chest tubes: removed on 08/23/16  Chest X-ray with Minimal bibasilar atelectasis, encourage Incentive Spriometry  use.    Renal/GU: UTI Completed course of IV Ceftriaxone  Obstructive uropathy recent ureteral stenting 07/2016, creat falling but  fluctuating: from 1.3-1.8  Prior renal artery stenosis w stenting  AKI on CKD: resolving, Cr 1.7,  Incontinence yesterday no reports overnight.    Hematological:Hct 10/32, on ASA  DVT prophylaxis: SCDs, Hep SQ    Infectious Disease: Afebrile, WBC:12 down trending.    Endocrinological: DM: A1c 7.7,  Continue Sliding Scale Insulin  Endocrine consult completed    Pain is well controlled. Paravertebral pain pump removed 08/24/16, did not  require pain medication overnight.  Evaluated by therapy with recommendations for acute rehab level of care. Patient  was Mod I for mobiliy and Independent for ADL's. Requires ongoing medical  managment of current condition and comorbid conditons. Medically stable for  transfer to acute rehab 08/25/16.   Date of Onset: 08/08/16   Date of Admission: 08/25/2016 7:16:00 PM  Premorbid Functional Level: Per rehab liaison:  Mobility:   Mod I for mobility, uses FWW for community ambulation.  Activities of Daily Living:  Independent with ADL's  Cognition:  WNL  Communication:  WNL  Swallowing:  WNL  Social/Educational History: Completed bachelors degree, worked previously as a  Sales executive. Widowed, 4 children, 9 grandchildren. Enjoys reading.  Home Environment: Per rehab liaison:Patient  lives in basement of sons house in  [place] with kitchenette.  Performs stairs to main kitchen.  Has been staying with daughter/son in [place] for past month and will discharge  there. 2 level SFH with one step to enter, no railing. Is able to remain on  entry level of home if needed.  Family is  supportive and can assist as needed.  DME prior to admission: Four wheel walker, Front wheel walker, Wheelchair-manual  (shower chair)    Medications and Allergies: Significant rehabilitation considerations:   Per Epic, tape  Rehabilitation Precautions/Restrictions:   Falls, Left MCA CVA on admission    SUBJECTIVE  Patient/Caregiver Goals:  Patient's functional goals: "To be able to walk  without any device".  Understanding of Current Condition: Patient alert and oriented and demonstrates  understanding of current medical and functional status.  Pain: Patient currently without complaints of pain.  Pain Reassessment: Pain was not reassessed as no pain was reported.    OBJECTIVE  Vision and Hearing: Glasses for reading, reports mild hearing impairment    Oral Motor Exam: Grossly WFL; natural dentition  Swallow:   Clinical Assessment: WFL - assessed with graham cracker, water, timely oral  preparation and a-p transit, adequate hyolaryngeal elevation to palpation.  Occasional throat clear with sips water, patient educated on s/s aspiration.  Recommend continue regular/thins diet.   Recommended Diet: Regular diet. Thin liquids.   Swallow Precautions: Upright position, Alert, Pulmonary monitoring    Language/Cognitive Tests Administered:  WAB Bedside Aphasia Assessment - Anomic  classification  BNTshort form - 15/15, 1x 5+sec pause (stethoscope)    Communication:   Motor Speech: WFL   Auditory Comprehension:   Mild - 9/10 on WAB yes/no questions, error on abstract complex "do you eat a  banana before you peel it"   Reading Comprehension: Mild - skipped 2 words reading picture caption from  magazine, no attempt at monitoring or self-correction   Verbal Expression:  Mild-moderate - 2x instances of significant word finding  difficulty in conversation during SLP eval. Impacts patient's communicative  effectiveness.   Pragmatic Language:  WFL   Written Expression:  WFL    Cognition:   Basic cognitive skills intact, patient  following directions appropriately and  asking for help as needed. Further cognitive assessment warranted as part of SLP  plan of care to assess higher level cognitive function.    Functional Measures      COMPREHENSION: Auditory comprehension is the usual mode. Patient does not  comprehend complex/abstract information in their primary language without  assistance from a helper. Comprehension Score = 5, Supervision. Patient  comprehends basic daily needs or ideas greater than 90% of the time. Patient  requires stand by/rare prompting.  Patient has the following assistive device(s) or limitations: Auditory  processing    EXPRESSION: Vocal expression is the usual mode. Patient does not express  complex/abstract information in their primary language without a helper.  Expression Score = 4, Minimal Prompting. Patient expresses basic daily needs or  ideas 75-90% of the time.  Patient requires minimal/occasional prompting.  Patient has the following assistive device(s) or limitations: Aphasia and/or  apraxia    SOCIAL INTERACTION: Social Interaction Score = 6, Modified Independent.  Patient  is modified independent for social interaction, requiring: Requires additional  time.    PROBLEM SOLVING: Patient does not make appropriate decisions in order to solve  complex problems without assistance from a helper. Problem Solving Score = 4,  Minimal Direction. Patient makes appropriate decisions in order to solve routine  problems 75-90% of the time. Patient requires minimal/occasional direction for  the following behavior(s): Processing information, Self correcting performance    MEMORY: Memory Score = 5, Supervision.  Patient recognizes and remembers with  prompting only under stressful or unfamiliar conditions, but no more than 10% of  the time, for the following behavior(s): Remembering immediate  information/instructions (after minutes), Remembering delayed  information/instructions (after days), Using environmental cues,  Using memory  aids    CARE Tool  Hearing, Speech, Vision: Expression of Ideas and Wants: Exhibits some difficulty  with expressing needs and ideas (e.g., some words or finishing thoughts) or  speech is not clear.  Understanding Verbal Content: Usually Understands: Understands most  conversations, but misses some part/intent of message. Requires cues at times to  understand.    Interventions: None provided today.  Patient was left seated in chair at nurse's station. Handoff to nurse/nursing  Cytogeneticist completed.  Interdisciplinary Educational Needs and Learning Preferences:       Learning Preference: The patient's preferred learning method is:  Explanation.  The patient's preferred learning method is: Demonstration.  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       Barriers to Learning: No barriers.       Learning Needs: Communication/cognition, Plan of care, Rehabilitation  techniques and procedures, Stroke, Swallowing    Education Provided: Plan of care. Rehab techniques and procedures. Role of SLP  Communication strategies. Diet texture recommendations.       Audience: Patient.       Mode: Explanation.       Response: Verbalized understanding.  Needs reinforcement.    ASSESSMENT  Patient presents with mild expressive language impairment characterized by word  finding difficulty with higher level verbal output i.e. conversation. Patient  will benefit from SLP intervention to introduce word retrieval strategies and  provide guided practice to facilitate accurate implementation. Patient also  demonstrated possible mild impairments in auditory and reading comprehension,  warranting further formal assessment in these areas due to small sample size of  initial screening assessments. Though patient's basic cognitive skills are  intact she will benefit from SLP screening for higher level skills e.g.  executive function and intervention in this area as warranted. Patient is  expected to make  good progress in acute rehab given high level of motivation,  intact cognition, and family support.  Rehab Potential: Able to participate in an intensive inpatient interdisciplinary  rehabilitation program, Good family/social support, Good premorbid functional  status, Good premorbid medical status, Living in the community premorbidly,  Motivated  Barriers to Progress/Discharge: No potential barriers to progress.    IRFPAI Goals    Mode of Comprehension: A  Comprehension: 6  Mode of Expression: V  Expression: 5  Social Interaction: 6  Problem Solving: 5  Memory: 6    Short Term Goals: Not applicable.  Long Term Goals:  Time frame to achieve long term goal(s):  7-10 days from  initial evaluation (08/26/2016)       1. Patient will implement word retrieval strategies (description,  categorization) in conversation given 1x probing question (e.g. what does it  look like) from caregiver in order to overcome at least 75% of anomic events in  5 min conversation.  2. Patient will read newspaper article using strategies to maximize  comprehension (read out loud, follow with finger, cover sheet) with min A from  caregiver/clinician in order to answer 4/5 comprehension questions accurately.    Risks/Benefits of Rehabilitation Discussed with Patient/Caregiver: Yes.  Recommendations/Goals for Rehabilitation Discussed with Patient/Caregiver: Yes.    PLAN  Speech Pathology Plan: Speech Language Pathology is recommended to address:  Recommended Frequency/Duration/Intensity: 60 min/day, 5-6 days/week for 7-10  days from 08/26/2016, 1:1 and group tx as appropriate  Activities Contributing Toward Care Plan: Cognitive-linguistic assessment and  treatment planning, dysphagia assessment, Patient/family education, Plan of care  coordination with care team, Discharge planning      TEAM CARE PLAN  Please review Integrated Patient View Care Plan Flowsheet for Team identified  Problems, Interventions, and Goals.    Identified problems from  team documentation:  Problem: Impaired Bladder Management  Bladder Mgmt: Primary Team Goal: Patient will be continent of bladder./    Problem: Impaired Bowel Management  Bowel Mgmt: Primary Team Goal: Patient will remain continent of bowel until  discharge./    Problem: Impaired Pain Management  Pain Mgmt: Primary Team Goal: Patient will verbalized pain less than 2 out of 10  with the use of medication and positioning techniques.Marland Kitchen/    Problem: Safety Risk and Restraint  Safety: Primary Team Goal: Patient will recognize limitations and call for  assistance before attempting mobility 100% of the time in order to prevent  fall./    Identified problems from this assessment:     Communication : Caregiver will demonstrate appropriate cueing (i.e. probing  questions) to address word finding difficultly in order to facilitate patient's  communication of wants/needs in community.    Discipline:  Speech/Language Pathology    3 Hour Rule Minutes: 60 minutes of SLP treatment this session count towards  intensity and duration of therapy requirement. Patient was seen for the full  scheduled time of SLP treatment this session.  Therapy Mode Minutes: Individual: 60 minutes.    * Original [address] Cognitive Scale co-authored by Bonney Aid, Ph.D.,  Margot Chimes, M.A., Lanier Ensign, M.A., [facility], [place].  Revised 10/07/73 by Margot Chimes, M.A., and Vernie Shanks, O.T.R.    Signed by: Judd Lien, M.S., CFY/SLP 08/26/2016 11:00:00 AM

## 2016-08-26 NOTE — Assessment & Plan Note (Signed)
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 Found by family in her room  struggling to put on her shirt, not speaking or responding properly. She was  outside window for lytics.  Past Medical History of DM2, HTN, HLD, CAD with CABG, and Recurrent UTIs (s/p  Renal Stent 08/05/16).    Neurological: Acute CVA: with mild right sided deficits, cardioembolic in  etiology    Cardiac workup: Found to have Large RA mass occupying much of the RA and  Possible interatrial shunt  Right Atrial Myxoma found on Echo and confirmed on TEE.  TEE confirms: Large RA mass taking up most or FRA cavity but not obstructing or  prolapsing across the TV orifice. Size: at least 5.5 cm in height and 3.5 cm in  width.  Cardiac Cath was completed and patient cleared for surgery  Now s/p atrial septal mass excision via right thoracotomy and PFO closure on  08/21/16    Cardiovascular:SR 80's EF: 65%, Moderate Aortic Stenosis  CAD s/p 4V-CABG in 2010 [facility], [place])  Left Heart Cath 08/17/16 - 3/4 bypass grafts patent with chronic occlusion of RCA  and right coronary bypass graft - stable anatomy.  HTN: Continue Metoprolol, norvasc added due to episode of hypertention  overnight.  Plavix started per  neurology recommendations.  Core Measures: Aspirin - Y; Beta blocker - Y; ACE - N; Statin - Y    Pulmonary: Weaned to RA  Chest tubes: removed on 08/23/16  Chest X-ray with Minimal bibasilar atelectasis, encourage Incentive Spriometry  use.    Renal/GU: UTI Completed course of IV Ceftriaxone  Obstructive uropathy recent ureteral stenting 07/2016, creat falling but  fluctuating: from 1.3-1.8  Prior renal artery stenosis w stenting  AKI on CKD: resolving, Cr 1.7,  Incontinence yesterday no reports overnight.    Hematological:Hct 10/32, on ASA  DVT prophylaxis: SCDs, Hep SQ    Infectious Disease: Afebrile, WBC:12 down trending.    Endocrinological: DM: A1c 7.7,  Continue Sliding Scale Insulin  Endocrine consult completed    Pain is well controlled. Paravertebral pain pump removed 08/24/16, did not  require pain medication overnight.  Evaluated by therapy with recommendations for acute rehab level of care. Patient  was Mod I for mobiliy and Independent for ADL's. Requires ongoing medical  managment of current condition and comorbid conditons. Medically stable for  transfer to acute rehab 08/25/16.            Date of Onset: 08/08/16            Date of Admission: 08/25/2016 7:16:00 PM    Medications and Allergies: Significant rehabilitation considerations:   See EPIC.  Rehabilitation  Precautions/Restrictions:   Falls, aspiration (on regular consistency consistent carbohydrate diet), DM,  HTN, 3 toes on left foot amputated    SUBJECTIVE  Patient Reports: I'm eating well.  My daughter in law is a dietitian and helps  me with my meals.  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 Widowed, 4 children, 9 grandchildren. Enjoys reading.  Patient/Caregiver Goals:  Patient's functional goals: "To be able to walk  without any device".    OBJECTIVE  Labs:   Results for Glenda Green, Glenda Green (MRN [id number]) as of 08/26/2016 14:26    08/26/2016 04:46  Glucose: 105 (H)  BUN: 34 (H)  Creatinine:  1.8 (H)  Sodium: 138  Potassium: 4.5  Chloride: 102  CO2: 27  Calcium: 8.9  Anion Gap: 9.0  EGFR: 27.2  Weight: 164 pounds. 74.55 kilograms.  Height:  64 inches. 1.63 meters.  BMI: 28.06 kilogram per meters squared.  Weight Group:  Overweight    Admission Weight: 164  Usual Weight: 140 pounds.    Food Allergies/Intolerances: No known food allergies.  Religious/Cultural Food Practices: NA  Current Medical/Nutrition Therapy: Diet: Consistent Carbohydrate Calories.   No food consistency restrictions diet with no liquid consistency restrictions.    % of Meals Consumed: 75-100 %  Feeding Modality and Dentition: oral  Current Problems/GI Symptoms: No Current GI Problems Noted.  Wounds/Incisions:  Absent    ASSESSMENT  Overall Assessment of Nutritional Status:  Current Nutrition Intake: Adequate.  Current Nutrition Status: Adequate.  Nutritional Risk Assessment:  No nutritional risk present.  Nutrition Diagnosis: No nutrition problems currently.    Interventions Provided/Recommendations:   Assessment Completed.   Monitor Progress.   Nutritional Adequacy Assessed:  Education/Discharge Planning Needs: Not applicable.    PLAN  Recommendations for Follow-up: Will continue with Con CHO diet in place.  Monitor po intake, tolerance.  Monitor wt, fluids, lab trends.  Follow 09/16/16.      Care Plan  Identified problems from team documentation:  Problem: Impaired Bladder Management  Bladder Mgmt: Primary Team Goal: Patient will be continent of bladder at least  75% of the time with timed tolieting/Active    Problem: Impaired Bowel Management  Bowel Mgmt: Primary Team Goal: Patient will be able sustain bowel continence  with timed toileting at least 75% of the time/Active    Problem: Impaired Communication  Communication: Primary Team Goal: Caregiver will demonstrate appropriate cueing  (i.e. probing questions) to address word finding difficutly in order to  facilitate patient's communicaiton of wants/needs in  community./    Problem: Impaired Mobility  Mobility: Primary Team Goal: Patient will transfer from sitting to standing and  ambulate 50 feet with LRAD with mod I, ambulate 150 feet with LRAD with  supervision, and climb 12 steps with unilateral HR with CGA so patient can  maintain modified independence on one level and access all levels of sons'  homes./Active    Problem: Impaired Pain Management  Pain Mgmt: Primary Team Goal: Patient will verbalized pain less than 2 out of 10  with the use of medication and positioning techniques.Marland KitchenJorje Guild    Problem: Impaired Self-care Mgmt/ADL/IADL  Self Care: Primary Team Goal: Ms. Lempke will perform basic UB/LB dressing at  MOD I./Active    Problem: Safety Risk and Restraint  Safety: Primary Team Goal: Patient will recognize limitations and call for  assistance before attempting mobility 100% of the time in order to prevent  fall./Active    Identified problems from this assessment:  Nutrition : Maintain meal intake of 75% or more through LOS    Discipline:  Nutrition    Please review Integrated Patient View Care Plan Flowsheet for Team identified  Problems, Interventions, and Goals.    Signed by: Darcella Cheshire, RD 08/26/2016 2:29:00 PM

## 2016-08-26 NOTE — Rehab Progress Note ([facility]) (Signed)
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  Pain Reassessment: Pain was not reassessed as no pain was reported.  Patient/Caregiver Goals:  "To be able to walk without any device".    NEURO  Orientation/Awareness: Alert and Oriented x3.  Speech: Clear at times.  Behavior: Cooperative.  Poor Judgment.    MEDICATIONS  IV Access: No IV access.  Dialysis Access: Patient does not have dialysis access.      Elopement Risk Level Assessment Tool  Patient Criteria:  CATEGORY 1 CRITERIA:   No category 1 criteria. Elopement Risk:  No      RISK ASSESSMENT FOR FALLS/INJURY    MENTAL STATUS CRITERIA:   10 - Impaired Judgement/impulsive.   MENTAL STATUS TOTAL: 10    AGE CRITERIA:   31 - 85 years old and above  AGE TOTAL: 2    ELIMINATION CRITERIA:   5 - Incontinence.   3 - Toileting with Assistance.   ELIMINATION TOTAL: 8    HISTORY OF FALLS CRITERIA:   2 - Unknown History.  HISTORY OF FALLS TOTAL: 2    MEDICATIONS CRITERIA:   1 High Risk Medication (*see list below)   MEDICATIONS TOTAL: 1    PHYSICAL MOBILITY CRITERIA:   3 - Decreased balance reaction.   1 - Weakness/impaired physical mobility.   PHYSICAL MOBILITY TOTAL: 4    FALLS RISK ASSESSMENT TOTAL: 27    Patient's Fall Risk: TOTAL SCORE >10: High Risk    Falls Interventions: Yellow fall risk marker applied to patient identification  band  Oriented to call bell  and placed within reach.  Personal items within reach.  Hourly toileting/safety checks between 6am and 10pm, then every 2 hours.  Bed alarm in place and activated.  Chair alarm in place and activated.  Initiate fall care plan and outcome.  Clutter removed and clear path to BR.  Assistive devices positioned out of reach.  Reoriented PRN.    NUTRITION  Diet:  Type: Consistent carbohydrate. Regular.  Food Consistency: Regular.  Liquid Consistency: Thin.      CARDIOVASCULAR     Bilateral lower extremities  Nail Bed Color: Pink.  Pulses:   Right Brachial Pulse: Regular. Strong. Rate is 68 .   Patient does not have a pacemaker.   Patient does not have a defibrillator.    CARDIOPULMONARY  Lung Sounds:   Upper lobes. Clear.   Lower lobes. Clear.  Type of Respirations: Regular.  Cough: No cough noted.  Respiratory Support: The patient does not require any respiratory support.  Respiratory Equipment: None. Denies any distress with resp[iration.    INTEGUMENTARY  Skin:  Temperature: Warm  Turgor: Normal for age  Moisture: Dry  Color of skin: Normal for Race/Ethnicity  Capillary Refill: Less than 3 seconds  Bruising: scartered bruises to both extremities  Wounds/Incisions: No wounds or incisions.  Braden Scale for Predicting Pressure Sore Risk: Sensory Perception: Slightly  limited  Moisture: Occasionally moist  Activity: Chairfast  Mobility: Slightly limited  Nutrition: Adequate  Friction and Shear: Potential problem  Braden Score: 16  Level of Risk: At risk (15-18)   Incontinent Protocol   Turn patient every 2 hours   Assist patient to the bathroom every 2 hours    GASTROINTESTINAL  Abdomen: Soft. Nontender.  Bowel Sounds:  Active bowel sounds audible in all four quadrants.  Date of Last Bowel Movement:  08/26/16  Current Bowel Pattern:  Incontinent   Problems/Complaints with Bowel Elimination: Loose Stools. Hiold stool softner  this evening    GENITOURINARY  Current Bladder Pattern: Incontinent  Color:  Amber    Problems/Complaints with Urination: Incontinence    Bowel and Bladder Output:                       Bladder (# only)             Bowel (# only)  Number of Episodes  Continent            0                            0  Incontinent          2                            2    MUSCULOSKELETAL  Upper Body: Right hand swollen and weak  Lower Body: right leg weakness    Functional Measures       Bladder Score = 2. Patient performs 25-49% of tasks and requires maximal  assistance for bladder management. Helper provides most of the assist to apply  AND remove brief.    BLADDER ACCIDENTS THIS SHIFT:  0 . Patient has not had an accident but used an  adult brief this assessment.     Bowel Score = 2. Patient performs 25-49% of tasks and requires maximal  assistance for bowel management. Helper provides most of the assist to apply AND  remove brief.    BOWEL ACCIDENTS THIS SHIFT: 0 . Patient has not had an accident, but used a  stool softener.    Education Provided:    Education Provided: Precautions. Pain management. Pain scale. Side effects.  Medication options. Clinical indicators of pain. Plan of care. Safety. Swallow  precautions. Stroke. Types of stroke, Signs /T/ Symptoms associated with stroke,  Stroke prevention, Medication compliance, Healthy diet, Increase physical  activity, Reduce stress, Pressure ulcer, Prevention of contractures/subluxation       Audience: Patient.       Mode: Explanation.       Response: Verbalized understanding.    Discharge: Patient is not being discharged at this time.    Long Term Goals: 1. Patient will be continent with bladder 100% most of the  time.   2. Patient will remain continent of bowel until discharge.   3. Patient will recognize limitations and call for assistance before attempting  mobilty 100% of the time in order to prevent falls.   4. Surgical wound will be clean with no drainage to minimize risk of infection.   5. Patient will verbalized pain level 2/10 after giving medication  and  repositioning techniques.  2 weeks  after 08/25/2016  Short Term Goals: 1. Patient will call for assistance 75% of the time before  attempting transfer to prevent falls during hospitalization.   2. Patient will verbalize pain  less than 3 out of 10 with or without  medication.   3. Patient identifies correct drug indications, dosage, administration times  and side effects 75% on the medications.  1 week after 08/25/2016    PROGRESS TOWARD GOALS: Both long and short term goals are in progress    PLAN: Nursing Specific Interventions  Bladder Management. Bowel Management. Medication Management. Skin Management.  Continue with the current Nursing Plan of Care.    TEAM CARE PLAN  Identified problems from team documentation:  Problem: Impaired Bladder Management  Bladder Mgmt: Primary Team Goal: Patient will be continent of bladder at least  75% of the time with timed tolieting/Active    Problem: Impaired Bowel Management  Bowel Mgmt: Primary Team Goal: Patient will be able sustain bowel continence  with timed toileting at least 75% of the time/Active    Problem: Impaired Communication  Communication: Primary Team Goal: Caregiver will demonstrate appropriate cueing  (i.e. probing questions) to address word finding difficutly in order to  facilitate patient's communicaiton of wants/needs in community./    Problem: Impaired Mobility  Mobility: Primary Team Goal: Patient will transfer from sitting to standing and  ambulate 50 feet with LRAD with mod I, ambulate 150 feet with LRAD with  supervision, and climb 12 steps with unilateral HR with CGA so patient can  maintain modified independence on one level and access all levels of sons'  homes./Active    Problem: Impaired Nutrition / Hydration  Nutrition: Primary Team Goal: Maintain meal intake of 75% or more through LOS/    Problem: Impaired Pain Management  Pain Mgmt: Primary Team Goal: Patient will verbalized pain less than 2 out of 10  with the use of medication and  positioning techniques.Marland KitchenJorje Guild    Problem: Impaired Self-care Mgmt/ADL/IADL  Self Care: Primary Team Goal: Ms. Pennino will perform basic UB/LB dressing at  MOD I./Active    Problem: Safety Risk and Restraint  Safety: Primary Team Goal: Patient will recognize limitations and call for  assistance before attempting mobility 100% of the time in order to prevent  fall./Active    Add/Update Problems from this assessment:  No updates at this time.    Please review Integrated Patient View Care Plan Flowsheet for Team identified  Problems, Interventions, and Goals.    Signed by: Lia Foyer, RN 08/26/2016 4:00:00 PM

## 2016-08-26 NOTE — Rehab Evaluation ([facility]) (Addendum)
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  History of Present Illness: 80yo female admitted to [facility] on 08/08/16 with aphasia  and MRI showing an acute left MCA distribution CVA. Found by family in her room  struggling to put on her shirt, not speaking or responding properly. She was  outside window for lytics.  Past Medical History of DM2, HTN, HLD, CAD with CABG, and Recurrent UTIs (s/p  Renal Stent 08/05/16).    Neurological: Acute CVA: with mild right sided deficits, cardioembolic in  etiology    Cardiac workup: Found to have Large RA mass occupying much of the RA and  Possible interatrial shunt  Right Atrial Myxoma found on Echo and confirmed on TEE.  TEE confirms: Large RA mass taking up most or FRA cavity but not obstructing or  prolapsing across the TV orifice. Size: at least 5.5 cm in height and 3.5 cm in  width.  Cardiac Cath was completed and patient cleared for surgery  Now s/p atrial septal mass excision via right thoracotomy and PFO closure on  08/21/16    Cardiovascular:SR 80's EF: 65%, Moderate Aortic Stenosis  CAD s/p 4V-CABG in 2010 [facility], [place])  Left Heart Cath 08/17/16 - 3/4 bypass grafts patent with chronic occlusion of RCA  and right coronary bypass graft - stable anatomy.  HTN: Continue Metoprolol, norvasc added due to episode of  hypertention  overnight.  Plavix started per neurology recommendations.  Core Measures: Aspirin - Y; Beta blocker - Y; ACE - N; Statin - Y    Pulmonary: Weaned to RA  Chest tubes: removed on 08/23/16  Chest X-ray with Minimal bibasilar atelectasis, encourage Incentive Spriometry  use.    Renal/GU: UTI Completed course of IV Ceftriaxone  Obstructive uropathy recent ureteral stenting 07/2016, creat falling but  fluctuating: from 1.3-1.8  Prior renal artery stenosis w stenting  AKI on CKD: resolving, Cr 1.7,  Incontinence yesterday no reports overnight.    Hematological:Hct 10/32, on ASA  DVT prophylaxis: SCDs, Hep SQ    Infectious Disease: Afebrile, WBC:12 down trending.    Endocrinological: DM: A1c 7.7,  Continue Sliding Scale Insulin  Endocrine consult completed    Pain is well controlled. Paravertebral pain pump removed 08/24/16, did not  require pain medication overnight.  Evaluated by therapy with recommendations for acute rehab level of care. Patient  was Mod I for mobiliy and Independent for ADL's. Requires ongoing medical  managment of current condition and comorbid conditons. Medically stable for  transfer to acute rehab 08/25/16.            Date of Onset: 08/08/16            Date of Admission: 08/25/2016 7:16:00 PM    Rehabilitation Precautions Restrictions:  Falls, Left MCA CVA on admission  Social History:  Marital Status: Widowed  Children: 4          Reside:  Lives with patient  Employment Status:  Retired  Recreational Activities/Hobbies:  Reading    HEIGHT and WEIGHT  Weight: 164 pounds. 74.55 kilograms. Patient weighed using bed scale.  Height: 64 inches. 1.63 meters.  BMI: 28.06 kilogram per meters squared.    ORIENTATION  Instructions and information: Administered to: Patient was given instructions  and information on hospital orientation. Orientation was provided for the  following areas: Orientation checklist, Rehab handbook, Bed controls, Call  light, Chaplain, Daily routine, Meal times, Phone and  phone numbers, Visiting  hours  Primary Language: English  Armband: Correct armband in place.  Valuables/Personal Items:  Patient states that they have valuables and/or  personal items present.  The following valuables and/or personal items are present: glasses The patient  received information that facility is not responsible for any valuables brought  in or acquired during hospital stay.  Understanding of Current Condition: Patient alert and oriented and understand  present medical status.  Patient/Caregiver Goals:  Patient's functional goals: "To be able to walk  without any device".    MEDICATIONS AND ALLERGIES  No B/P or Needle Sticks: Not applicable.  Personal Medications: Patient did not bring a personal supply of medications.  Vaccinations:  Tetanus: can't remember  Pneumovax: can't remember  Influenza: 2017  IV Access: No IV access.  Dialysis Access: Patient does not have dialysis access.    Medication Allergies:  Food Allergies:  Other Allergies:    Pain: Patient currently has pain.  Location: Right breast, chest tube incision site  Type: Acute  Quality:  discomfort  Pain Scale: Numeric.  Patient reports a pain level of 5 out of 10.  Patient's acceptable level of pain 5 out of 10.   Interferes with physical activity.  Pain is alleviated by: repositioning  Pain is exacerbated by: when moves No pain intervention was facilitated because  patient refused.  Pain Reassessment: Patient refused pain medication.    Elopement Risk Level Assessment Tool  Patient Criteria: Patient is not capable of leaving the unit.  Assessment is not  applicable.      RISK ASSESSMENT FOR FALLS/INJURY    MENTAL STATUS CRITERIA:   0 - None identified.  MENTAL STATUS TOTAL: 0    AGE CRITERIA:   23 - 42 years old and above  AGE TOTAL: 2    ELIMINATION CRITERIA:   3 - Toileting with Assistance.   5 - Incontinence.   ELIMINATION TOTAL: 8    HISTORY OF FALLS CRITERIA:   10 - Has fallen two or more times.  HISTORY OF FALLS TOTAL:  10    MEDICATIONS CRITERIA:   2 or more High Risk Medications (*see list below)   MEDICATIONS TOTAL: 2    PHYSICAL MOBILITY CRITERIA:   3 - Decreased balance reaction.   1 - Weakness/impaired physical mobility.   PHYSICAL MOBILITY TOTAL: 4    FALLS RISK ASSESSMENT TOTAL: 26    Patient's Fall Risk: TOTAL SCORE >10: High Risk    Falls Interventions: Yellow fall risk marker applied to patient identification  band  Oriented to call bell and placed within reach.  Personal items within reach.  Hourly toileting/safety checks between 6am and 10pm, then every 2 hours.  Bed alarm in place and activated.  Chair alarm in place and activated.  Initiate fall care plan and outcome.  Clutter  removed and clear path to BR.  Assistive devices positioned out of reach.  Reoriented PRN.  Patient and family education.   hourly rounding    Restraint Initial: Patient does not have any restraints at this time.    NUTRITION/DIET  IRF-PAI Swallowing Status: Swallowing Status: Regular Food: solids and liquids  swallowed safely without supervision or modified food consistencies.  IRF-PAI Dehydration: Patient does not display clinical signs of dehydration.  Nutrition Screen: Patient's nutrition risk factors include: No risk factors  identified.  Diet: Type: Consistent carbohydrate.  Food Consistency: Regular.  Liquid Consistency: Thin.    PSYCHOSOCIAL  Psychosocial/Abuse Screen: No evidence of neglect/abuse.  Suicide Risk Screen: Patient does not have a primary or secondary behavioral  health diagnosis or complaint.    Current Alcohol Use:  Patient does not consume alcohol.  Current Tobacco Use:  No, patient does not use tobacco.  Current Drug Use: No, patient does not use recreational drugs.    REVIEW OF SYSTEMS  Eyes:   Right Eye:  Reactivity: Pupils equal, round and reactive to light and accommodation  (PERRLA).  Sclera Color: Clear   Eye is not draining     Left Eye:  Reactivity: Pupils equal, round and reactive to light and  accommodation  (PERRLA).  Sclera Color: Clear   Eye is not draining  Ears: Bilateral Ears: Within normal limits.  Hearing/Communication Device:  Mouth: Gums: Moist. Pink.  Tongue: Pink  Teeth: Oral hygiene appears to be adequate.    NEURO  Orientation/Awareness: Alert and Oriented x3.  Behavior: Cooperative.  Speech: Clear.    CARDIOVASCULAR     Right Lower Extremity  Nail Bed Color: First three toes unable to assessed due to amputation   No edema or redness present.   Left Lower Extremity  Nail Bed Color: Pink.   No edema or redness present.  Homan's Sign:   Negative bilateral lower extremities  Pulses:   Apical Pulse: Regular. Strong. Rate is 74 .   Patient does not have a pacemaker.   Patient does not have a defibrillator.    CARDIOPULMONARY  Lung Sounds:   Upper lobes. Clear.   Lower lobes. Clear.  Type of Respirations: Regular.  Cough: No cough noted.  Respiratory Support: The patient does not require any respiratory support.  Respiratory Equipment: None. 94% RA    INTEGUMENTARY  Skin:  Temperature: Warm  Turgor: Normal for age  Moisture: Dry  Color of skin: Normal for Race/Ethnicity  Capillary Refill: Less than 3 seconds   Bruising: scattered bruises on the back, abdomen, chest and upper extremities  Wound/Incisions:     Surgical Incision: right breast incision incision Length: 11 centimeter(s) with  sutures. No signs of infection.  Drainage: Incision without drainage.  Odor:  No  Incision Care: Per protocol.     Surgical Incision: right chest wall incision Length: 1 centimeter(s) open to  air No signs of infection.  Drainage: Incision without drainage.  Odor:  No  Incision Care: Per protocol.     Surgical Incision: below right breast incision Length: 0.5 centimeter(s) open  to air No signs of infection.  Drainage: Incision without drainage.  Odor:  No  Incision Care: Per protocol.     Surgical Incision: above right breast incision Length: 0.5 centimeter(s) open  to air No signs of infection.  Drainage:  Incision without drainage.  Odor:  No  Incision Care: Per protocol.     Surgical Incision: right chest wall incision Length: 1 centimeter(s) open to  air No signs of infection.  Drainage: Incision without drainage.  Odor:  No  Incision Care: Per protocol.     Surgical Incision: Pubic incision. Length: 5 centimeter(s) with glue. No signs  of infection.  Drainage: Incision without drainage.  Odor:  No  Incision Care: Per protocol.     Pressure ulcer. sacral   Stage 1 - Non-Blanchable Erythema.  Length: 4 centimeters    Width: 3 centimeters    Depth: No.  Wound Surface Area:  12 cm   Undermining: No  Tunneling:  No  Exudate: None  Odor: No  Peri-Wound Erythema: Yes   Care provided per protocol.           skin tear to left upper arm that is healing from previous fall per patient.            Braden Scale for Predicting Pressure Sore Risk: Sensory Perception: No  impairment  Moisture: Very moist  Activity: Chairfast  Mobility: Slightly limited  Nutrition: Adequate  Friction and Shear: Potential problem  Braden Score: 16  Level of Risk: At risk (15-18)   Turn patient every 2 hours   Assist patient to the bathroom every 2 hours   Incontinent Protocol    GENITOURINARY  Current Bladder Pattern: Incontinent  Color:  Yellow   Problems/Complaints with Urination: Incontinence    Sexuality: The patient denies any concerns regarding sexuality.    GASTROINTESTINAL  Abdomen: Soft. Nontender.  Bowel Sounds:  Bowel sounds audible in all four quadrants.  Date of Last Bowel Movement:  08/25/2016  Current Bowel Pattern:  Continent   No Problems/Complaints with Bowel Elimination Assessed.  The patient has normal bowel activity.  Bowel Movements: The patient has no pattern to their bowel movements. Patient  uses laxatives.    Bowel and Bladder Output:                       Bladder (# only)             Bowel (# only)  Number of Episodes  Continent            0                            0  Incontinent          2                             0    MUSCULOSKELETAL  Hand Dominance:  Left.  Upper Extremities  Impairments/Prosthesis/Devices: Right hand swollen and weak  Lower Extremities  Impairments/Prosthesis/Devices: right leg weakness    FUNCTIONAL MEASURES  Bladder Frequency/Number of Accidents (4 days prior to admission):  0 (Unknown)  Bowel Frequency/Number of Accidents (4 days prior to admission):  0 (Unknown)    EATING: Eating Score = 5. Patient is supervision/set-up for eating, requiring:  Stand by assistance.  Opening containers.  Cutting meat.  Pouring liquids.  Patient requires the following assistive device(s) No assistive devices were  required.    GROOMING: Grooming Score = 5. Patient is supervision/set-up for grooming,  requiring: Stand by assistance.  Verbal cuing, prompting, or instructing.  Setting out grooming equipment.  Patient requires the following assistive device(s) No assistive devices were  required.    BATHING: Patient bathed in bed. Patient requires no physical assistance for  washing, rinsing, and drying the right arm.  Patient requires no physical  assistance for washing, rinsing, and drying the left arm. Patient requires no  physical assistance for washing, rinsing, and drying the chest. Patient requires  no physical assistance for washing, rinsing, and drying the abdomen. Patient  requires total assistance for washing, rinsing, or drying the perineal area.  Patient requires total assistance for washing, rinsing, or drying the buttocks.  Patient requires total assistance for washing, rinsing, or drying the right  upper leg. Patient requires total assistance for washing, rinsing, or drying the  left upper leg. Patient requires total assistance for washing, rinsing, or  drying the right lower leg, including the foot. Patient requires total  assistance for washing, rinsing, or drying the left lower leg, including the  foot. Patient performs 40 % of bathing tasks. Bathing Score = 2, Maximal  Assistance.  Patient requires the  following assistive device(s):    UPPER BODY DRESSING: Patient requires no physical assistance for threading the  right arm through the undergarment. Patient requires no physical assistance for  threading the left arm through the undergarment. Patient requires maximal  physical assistance for bringing undergarment around body and fastening or  placing undershirt overhead. Patient requires no physical assistance for pulling  the undergarment in to place or pulling undershirt down over the trunk. Patient  requires no physical assistance for threading the right arm through the garment  (shirt/sweater). Patient requires no physical assistance for threading the left  arm through the garment (shirt/sweater). Patient requires moderate physical  assistance for pulling an over-head-garment over head or pulling  front-fastening-garment around back. Patient requires no physical assistance for  pulling an over-head-garment down the trunk or adjusting/fastening together a  front-fastening-garment. Patient performs 84.38 % of upper body dressing tasks.  Upper Body Dressing Score = 4, Minimal Assistance.  Patient requires the following assistive device(s):  No assistive devices were  required.    LOWER BODY DRESSING: Patient requires total assist for donning and/or doffing  undergarment threading right leg. Patient requires total assist for donning  and/or doffing undergarment threading left leg. Patient requires total assist  for donning and/or doffing undergarment over hips and adjusting fasteners.  Patient requires total assistance for donning and/or doffing pants/skirt  threading right leg. Patient requires total assistance for donning and/or  doffing pants/skirt threading left leg. Patient requires total assistance for  donning and/or doffing pants/skirt over hips and adjusting fastener. Patient  requires total assistance for donning and/or doffing right sock. Patient  requires total assistance for donning and/or doffing left  sock. Donning and/or  doffing right shoe was not observed for this patient. Donning and/or doffing  left shoe was not observed for this patient. Patient performs 0 -  24% of lower  body dressing tasks. Lower Body Dressing  Score = 1, Total Assistance.  Patient requires the following assistive device(s): No assistive devices were  required.    TOILETING: Patient requires total assistance for adjusting clothing before using  a toilet, commode, bedpan, or urinal. Patient requires total assistance for  hygiene. Patient requires total assistance for adjusting clothing after using a  toilet, commode, bedpan, or urinal. Patient performs 0 -  24% of toileting  tasks.  Toileting Score = 1, Total Assistance.  Patient requires the following assistive device(s):  No assistive devices were  required.     Bladder Score = 1. Patient performs less than 25% of tasks and requires total  assistance for bladder management. Helper provides total assist to completely  apply and remove  brief.   There was a bladder accident. Clothing was soiled.  There was a bladder accident. Linens were soiled.  There was a bladder accident. Floor was soiled.    BLADDER ACCIDENTS THIS SHIFT:  1 .     Bowel Score = 6. Patient is modified independent for bowel management  requiring: Stool softeners    BOWEL ACCIDENTS THIS SHIFT: 0 . Patient has not had an accident, but used a  stool softener.    TRANSFERS BED/CHAIR/WHEELCHAIR: Bed/chair/wheelchair Transfer Score = 1.  Patient performs 25-49% of effort and requires maximal assistance (most of the  lifting) of two or more people for transferring to and from the  bed/chair/wheelchair. Patient is right sided weakness due to CVA. Marland Kitchen  Patient requires the following assistive device(s): Walker.  Elevated head of bed.    TRANSFER TOILET: Toilet Transfer Score = 1.  Patient performs 25-49% of effort  and requires moderate assistance (most of the lifting) of two or more people for  transferring to and from the  toilet/commode. Patient right side is weak due to  CVA. Marland Kitchen  Patient requires the following assistive device(s):  Walker.  Grab bars.    COMPREHENSION: Auditory comprehension is the usual mode. Comprehension Score =  6, Modified Independence.  Patient comprehends complex/abstract information in  their primary language, requiring:    EXPRESSION: Vocal expression is the usual mode. Expression Score = 7,  Independent.  Patient expresses complex/abstract information in their primary  language.  Patient is completely independent for vocal expression.  There are no  activity limitations.    SOCIAL INTERACTION: Social Interaction Score = 7, Independent. Patient is  completely independent for social interaction.  There are no activity  limitations.    PROBLEM SOLVING: Problem Solving Score = 6, Modified Independence.  Patient  makes appropriate decisions in order to solve complex problems, but requires  extra time.    MEMORY: Memory Score = 4, Minimal Prompting. Patient recognizes and remembers  75-90% of the time. Patient requires minimal/occasional prompting for memory for  the following: Recognizing room or belongings, Remembering immediate  information/instructions (after minutes)    IRFPAI GOALS    Bladder: 2  Bowel: 2    FUNCTION  Functional Screen: Patient's functional risk limitations include: Decreased use  of one or both upper extremities.  Requires assistance for ambulation. Therapy consults were ordered.  Discharge Planning Screen: Potential discharge planning issues include: None  Social Services/Case Management consult was ordered.    Interdisciplinary Educational Needs and Learning Preferences:       Learning Preference: The patient's preferred learning method is:  Explanation.       Barriers to Learning: No barriers.       Learning Needs: Bowel/bladder programs/training, Medical management, Plan  of care, Precautions, Safety, Skin/wound care, Stroke    Education Provided: Precautions. Plan of care. Pain  management. Pain scale.  Medication options. Clinical indicators of pain. Safety issues and  interventions. Fall protocol. Supervision requirements. Bowel and bladder  programs. Bladder training program. Bowel Training program. Skin/wound care.  Signs/symptoms of infection. Skin care for the incontinent patient. Prevention  of skin breakdown. Incision care. Medication. Name and dosage. Administration.  Purpose. Side Effects. Interaction. Stroke. What is stroke, Types of stroke,  Signs /T/ Symptoms associated with stroke, Stroke prevention, Medication  compliance, Healthy diet, Increase physical activity, Reduce stress       Audience: Patient.       Mode: Explanation.       Response: Verbalized  understanding.  Needs reinforcement.    CARE Tool  Self Care UsualPerformance at Admission:  Eating: Helper provides verbal cues or touching/steadying assistance as patient  completes activity.  Oral Hygiene: Helper provides verbal cues or touching/steadying assistance as  patient completes activity.  Toileting Hygiene: Helper does more than half the effort. Helper lifts or holds  trunk or limbs and provides more than half the effort.  Shower/Bathe Self: Helper does more than half the effort. Helper lifts or holds  trunk or limbs and provides more than half the effort.  Upper Body Dressing: Helper does more than half the effort. Helper lifts or  holds trunk or limbs and provides more than half the effort.  Lower Body Dressing: Helper does all of the effort. Patient does none of the  effort to complete the activity. Or, the assistance of 2 or more helpers is  required for the patient to complete the activity.  Putting On/Taking Off Footwear: Helper does all of the effort. Patient does none  of the effort to complete the activity. Or, the assistance of 2 or more helpers  is required for the patient to complete the activity.  Special Treatments, Procedures, and Programs: Patient did not receive total  parenteral nutrition  treatment at the time of admission.    ASSESSMENT  Summary of Rehab Nursing-specific Deficits:   Bladder Dysfunction (Incontinence, Retention, Self-care deficit) Patient is  incontinent.   Safety: Risk for fall Patient is right sided weakness.   Skin Integrity  Rehab Potential: Able to participate in an intensive inpatient interdisciplinary  rehabilitation program, Good family/social support, Motivated  Barriers to Progress/Discharge: No potential barriers to progress.    Long Term Goals:   Time frame to achieve long term goal(s): 2 weeks after  08/25/2016       1. Patient will be continent with bladder 100% most of the time.       2. Patient will remain continent of bowel until discharge.       3. Patient will recognize limitations and call for assistance before  attempting mobilty 100% of the time in order to prevent falls.       4.  Surgical wound will be clean with no drainage to minimize risk of  infection.       5. Patient will verbalized pain level 2/10 after giving medication and  repositioning techniques.  Short Term Goals:  Time frame to achieve short term goal(s): 1 week after  08/25/2016       1. Patient will call for assistance 75% of the time before attempting  transfer to prevent falls during hospitalization.       2. Patient will verbalize pain  less than 3 out of 10 with or without  medication.       3. Patient identifies correct drug indications, dosage, administration  times and side effects 75% on the medications.      PLAN  Nursing-specific Interventions:   Bowel Management:   Bladder Management:   Medication Management:   Skin Management:    TEAM CARE PLAN  Identified problems from team documentation:      Identified problems from this assessment:     Bladder Management : Patient will be continent of bladder.   Bowel Management : Patient will remain continent of bowel until discharge.   Safety Risk : Patient will recognize limitations and call for assistance before  attempting mobility 100% of  the time in order to prevent fall.   Pain Management :  Patient will verbalized pain less than 2 out of 10 with the  use of medication and positioning techniques..    Discipline:  Nursing    Please review Integrated Patient View Care Plan Flowsheet for Team identified  Problems, Interventions, and Goals.    Signed by: Electa Sniff, RN 08/25/2016 11:50:00 PM

## 2016-08-26 NOTE — Progress Notes (Signed)
[facility], [employer].  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        Dione Housekeeper, MD  Office - [phone]  [employer] [phone]  ++++++++++++++++++++++++++++++++++++++++++++++++++++++++++++++  Subjective:  No new complaints    Medications:  Scheduled Meds:  Current Facility-Administered Medications   Medication Dose Route Frequency   . amiodarone  200 mg Oral BID   . amLODIPine  2.5 mg Oral Daily   . aspirin EC  81 mg Oral Daily   . atorvastatin  20 mg Oral QHS   . clopidogrel  75 mg Oral Daily   . docusate sodium  100 mg Oral BID   . DULoxetine  60 mg Oral Daily   . heparin (porcine)  5,000 Units Subcutaneous Q12H [facility]   . insulin glargine  8 Units Subcutaneous QHS   . metoprolol  25 mg Oral Q12H [facility]   . mupirocin   Nasal BID     Continuous Infusions:     PRN Meds:acetaminophen, benzocaine-menthol, Nursing communication: Adult Hypoglycemia Treatment Algorithm **AND** dextrose **AND** dextrose **AND** glucagon (rDNA) **AND** Nursing communication: Document Significant Event Note, insulin regular, insulin regular, ondansetron, polyethylene glycol, senna, traMADol    Objective:  Vital signs in last 24 hours:  Temp:  [96.8 F (36 C)-98.1 F (36.7 C)] 96.8 F (36 C)  Heart Rate:  [64-74] 64  Resp Rate:  [18-20] 18  BP: (128-179)/(69-84) 179/84  Intake/Output last 24 hours:    Intake/Output Summary (Last 24 hours) at 08/26/16 1808  Last data filed at  08/26/16 1300   Gross per 24 hour   Intake                0 ml   Output                2 ml   Net               -2 ml     Intake/Output this shift:  I/O this shift:  In: -   Out: 2 [Stool:2]    Physical Exam:   Gen: WD WN NAD   CV: S1 S2 N RRR   Chest: Crackles    Ab: ND NT soft no HSM +BS   Ext: No C/E    Labs:    Recent Labs  Lab 08/26/16  0446 08/25/16  0356 08/24/16  1230 08/23/16  0643 08/22/16  0157 08/22/16  0156  08/21/16  0451   Glucose 105* 135* 216* 105*  --  153* More results in Results Review 145*   BUN 34* 35.0* 36.0* 38.0*  --  25.0* More results in Results Review 29.0*   Creatinine 1.8* 1.7* 1.8* 1.5*  --  1.4* More results in Results Review 1.6*   Calcium 8.9 8.4 8.3 8.9  --  8.8 More results in Results  Review 8.9   Sodium 138 135* 133* 137  --  141 More results in Results Review 141   Potassium 4.5 3.8 4.5 4.2  --  5.2* More results in Results Review 5.2*   Chloride 102 101 99* 105  --  111 More results in Results Review 112*   CO2 27 26 25 24   --  24 More results in Results Review 20*   Albumin 3.0*  --   --   --  3.8  --   --   --    Phosphorus  --   --   --  4.0  --  4.2  --  4.7   Magnesium  --  1.9 1.7 2.0  --  2.6 More results in Results Review 1.8   More results in Results Review = values in this interval not displayed.    Recent Labs  Lab 08/26/16  0446 08/25/16  0356 08/24/16  1231   WBC 10.74 12.88* 17.22*   Hgb 11.2* 10.3* 10.4*   Hematocrit 34.9* 32.2* 32.4*   MCV 87.9 88.2 89.5   MCH 28.2 28.2 28.7   MCHC 32.1 32.0 32.1   RDW 15 15 15    MPV 10.7 10.8 11.0   Platelets [phone]

## 2016-08-26 NOTE — Rehab Evaluation ([employer]) (Signed)
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 Found by family in her room  struggling to put on her shirt, not speaking or responding properly. She was  outside window for lytics.  Past Medical History of DM2, HTN, HLD, CAD with CABG, and Recurrent UTIs (s/p  Renal Stent 08/05/16).    Neurological: Acute CVA: with mild right sided deficits, cardioembolic in  etiology    Cardiac workup: Found to have Large RA mass occupying much of the RA and  Possible interatrial shunt  Right Atrial Myxoma found on Echo and confirmed on TEE.  TEE confirms: Large RA mass taking up most or FRA cavity but not obstructing or  prolapsing across the TV orifice. Size: at least 5.5 cm in height and 3.5 cm in  width.  Cardiac Cath was completed and patient cleared for surgery  Now s/p atrial septal mass excision via right thoracotomy and PFO closure on  08/21/16    Cardiovascular:SR 80's EF: 65%, Moderate Aortic Stenosis  CAD s/p 4V-CABG in 2010 [facility] [place])  Left Heart Cath 08/17/16 - 3/4 bypass grafts patent with chronic occlusion of RCA  and right coronary bypass graft - stable anatomy.  HTN: Continue Metoprolol, norvasc added due to episode of hypertention  overnight.  Plavix started per neurology  recommendations.  Core Measures: Aspirin - Y; Beta blocker - Y; ACE - N; Statin - Y    Pulmonary: Weaned to RA  Chest tubes: removed on 08/23/16  Chest X-ray with Minimal bibasilar atelectasis, encourage Incentive Spriometry  use.    Renal/GU: UTI Completed course of IV Ceftriaxone  Obstructive uropathy recent ureteral stenting 07/2016, creat falling but  fluctuating: from 1.3-1.8  Prior renal artery stenosis w stenting  AKI on CKD: resolving, Cr 1.7,  Incontinence yesterday no reports overnight.    Hematological:Hct 10/32, on ASA  DVT prophylaxis: SCDs, Hep SQ    Infectious Disease: Afebrile, WBC:12 down trending.    Endocrinological: DM: A1c 7.7,  Continue Sliding Scale Insulin  Endocrine consult completed    Pain is well controlled. Paravertebral pain pump removed 08/24/16, did not  require pain medication overnight.  Evaluated by therapy with recommendations for acute rehab level of care. Patient  was Mod I for mobiliy and Independent for ADL's. Requires ongoing medical  managment of current condition and comorbid conditons. Medically stable for  transfer to acute rehab 08/25/16.   Date of Onset: 08/08/16   Date of Admission: 08/25/2016 7:16:00 PM  Premorbid Functional Level: Pt was independent with basic ADLs and utilized cane  or walker for mobility. She was dependent for medication mgmt and reports no  knowledge of what she even takes at  home. She is dependent for IADLs i.e.  cooking, cleaning, laundry and her family takes care of the above for her.  Social/Educational History: Ms. Barcia has 5 sons. She is a retired Loss adjuster, chartered. She enjoys reading and cooking although reports she does  not do much cooking anymore.  Home Environment: Pt lives in basement of son's home in [place]. She will be  discharging to son's home in [place], [place]. Her bedroom is on the main level but  tub shower she uses is in the basement level. She has a standard shower chair  that sits inside tub. She reports no grab bars in  bathroom but has difficulty  remembering.    Medications and Allergies: Significant rehabilitation considerations:   See Epic  Rehabilitation Precautions/Restrictions:   Falls, Impaired memory, Incontinence Bowel and Bladder    SUBJECTIVE  Patient Report: "It's been about a year. It's really embarrassing" in regards to  incontinence.  Patient/Caregiver Goals:  Patient's functional goals: "To be able to walk  without any device".  Pain: Patient currently without complaints of pain.  Pain Reassessment: Pain was not reassessed as no pain was reported.    OBJECTIVE  General Observation: Pt received reclined in bed. Agreeable to participate in  ADL evaluation. Pt reporting fatigue.  Understanding of Current Condition: Patient alert and oriented and understand  present medical status.  Vital Signs:                       Before Activity              After Activity  Vitals  Time                 0800                         0855  Position/Activity    reclined in bed              reclined in bed  BP Systolic          164                          132  BP Diastolic         78                           73  Pulse                72                           101    Cognition: Pt is alert and oriented. She demonstrates impaired short term memory  and delayed recall, often repeatedly asking the same questions over again. She  has good attention to task and sequencing for rote ADL routine. Higher level  cognition to be assessed ongoing. Mild safety awareness deficits with education  required on fall protocol.  Vision:  Pt reports no changes in vision. (+) smooth pursuits and oculomotor ROM  WFL. Pt wears glasses for reading.  Perception: Ongoing assessment. No overt deficits based on observation of ADL  routine.    Upper Extremity Status   Tone: Normal tone BUEs   Range of Motion: Pt has impaired ROM in R shoulder reporting hx of frozen  shoulder x1 year. She has  enough active ROM to perform ADL routine but reports  pain in R shoulder  > 90 degrees of flexion and abduction. LUE is WFL.   Fine Motor: WFL BUEs.    Strength/Motor Control: Pt has 4/5 strength in LUE. Unable to formally assess  RUE 2/2 shoulder pain. She has good sitting balance unsupported and adequate  postural control in standing. She requires Min A for stand pivot transfers.  Denies feeling weaker in R hemibody but reports she has poor endurance. She does  require increased assistance as session progressed primarily with LB dressing  after full shower routine. She has metatarsal amputations I-III which she  reports has significantly impacted her balance.  Sensation: Pt denies numbness/tingling in BUEs. With vision occluded, pt has  impaired light touch and localization in BUEs primarily from elbow distal.  Pt  accurately identifies light touch <25% of time. Based on observation, however,  this does not impact her ability to perform ADL tasks. Will continue to assess.    Interventions:  Moderate Complexity Evaluation: An occupational profile and medical and therapy  history, which includes an expanded review of medical and/or therapy records and  additional review of physical, cognitive, or psychosocial history related to  current functional performance  An assessment(s) that identifies 3-5 performance deficits (i.e., relating to  physical, cognitive, or psychosocial skills) that result in activity limitations  and/or participation restrictions  Patient may present with comorbidities that affect occupational performance.  Minimal to moderate modification of tasks or assistance (i.e, physical or  verbal) with assessment(s) is necessary to enable patient to complete evaluation  component  Clinical decision-making of moderate analytic complexity, which includes an  analysis of the occupational profile, analysis of data from detailed  assessment(s), and consideration of several treatment options No treatment  provided today.  Patient was returned to or left in bed. Bed alarm in place  and activated.  Oriented to call bell and placed within reach.  Personal items within reach.  Assistive devices positioned out of reach.    Interdisciplinary Educational Needs and Learning Preferences:       Learning Preference: The patient's preferred learning method is:  Explanation.  The patient's preferred learning method is: Demonstration.  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       Barriers to Learning: Cognitive limitations.       Learning Needs: Equipment, Functional activities/mobility, Plan of care,  Precautions, Rehabilitation techniques and procedures, Safety, Stroke    Education Provided: Plan of care. Activities of daily living. Functional  transfers. Safety. Stroke. Signs /T/ Symptoms associated with stroke, Stroke  prevention       Audience: Patient.       Mode: Explanation.  Demonstration.       Response: Verbalized understanding.  Demonstrated skill.  Needs practice.    ASSESSMENT  Summary of Deficits and Prognosis: Ms. Silberstein is a 80 yo F s/p L MCA CVA. She  is currently unable to return home at MOD I level, as per PLOF, due to primary  limiting factors of decreased balance, poor activity tolerance, fatigue,  decrease RUE ROM, impaired sensation BUEs, and decreased ADL/mobility  performance due to the above. She will benefit from skilled inpatient OT as part  of interdisciplinary team in order to address above deficits, maximize LOF prior  to d/c, and facilitate safe d/c home.  Rehab Potential: Able to participate in an intensive inpatient interdisciplinary  rehabilitation program, Good premorbid functional status, Living in the  community premorbidly, Motivated  Barriers to Progress/Discharge: Architectural barriers in home, Poor activity  tolerance, Limited family/caregiver support    Functional Measures    EATING: Eating Score = 5. Patient is supervision/set-up for eating, requiring:  Stand by assistance.  Verbal cuing, prompting, or instructing.   locating  items  Patient requires the following assistive device(s) No assistive devices were  required.    GROOMING: Activity was not assessed.    BATHING: Patient bathed in shower. Bathing Score = 5.  Patient is  supervision/set-up for bathing, requiring: Standing by.  Verbal cuing, prompting, or instructing.  Setting out bathing equipment.  Preparing the water.  Patient requires the following assistive device(s): Grab bar/arm rest to  maintain balance.  Hand held shower.    UPPER BODY DRESSING: Upper Body Dressing Score = 5. Patient is  supervision/set-up for upper body dressing, requiring: Stand by assistance.  Gathering/setting out clothes.  Patient requires the following assistive device(s):  No assistive devices were  required.    LOWER BODY DRESSING: Patient requires no physical assistance for donning and/or  doffing undergarment threading right leg. Patient requires no physical  assistance for donning and/or doffing undergarment threading left leg. Patient  requires moderate assistance for donning and/or doffing undergarment over hips  and adjusting fasteners. Patient requires maximal assistance for donning and/or  doffing pants/skirt threading right leg. Patient requires maximal assistance for  donning and/or doffing pants/skirt threading left leg. Patient requires maximal  assistance for donning and/or doffing pants/skirt over hips and adjusting  fastener. Patient requires no physical assistance for donning and/or doffing  right sock. Patient requires no physical assistance for donning and/or doffing  left sock. Donning and/or doffing right shoe was not observed for this patient.  Donning and/or doffing left shoe was not observed for this patient. Patient  performs 65.62 % of lower body dressing tasks. Lower Body Dressing Score = 3,  Moderate Assistance.  Patient requires the following assistive device(s): No assistive devices were  required.    TOILETING: Activity was not assessed. pt incontinent    TRANSFER  TOILET: Toilet Transfer Score = 4.  Patient performs 75% or more of  effort and minimal assistance (little/incidental help/steadying) for  transferring to and from the toilet/commode, requiring: Steadying.  Patient requires the following assistive device(s):  Grab bars.    TRANSFER SHOWER: Shower Transfer Score = 4. Patient performs 75% or more of  effort and minimal assistance (little/incidental help/lifting of one  limb/steadying) for transferring to and from the shower, requiring: Steadying.  Grab bars.  Shower chair.    COMPREHENSION: Auditory comprehension is the usual mode. Comprehension Score =  7, Independent.  Patient comprehends complex/abstract information in their  primary language.  Patient is completely independent for auditory comprehension.   There are no activity limitations.    EXPRESSION: Vocal expression is the usual mode. Expression Score = 7,  Independent.  Patient expresses complex/abstract information in their primary  language.  Patient is completely independent for vocal expression.  There are no  activity limitations.    SOCIAL INTERACTION: Social Interaction Score = 5, Supervision.  Patient may  require encouragement to initiate participation. Patient requires directing only  under stressful or unfamiliar conditions, but less than 10% of the time, for the  following behavior(s): Adjustment/coping    PROBLEM SOLVING: Patient does not make appropriate decisions in order to solve  complex problems without assistance from a helper. Problem Solving Score = 4,  Minimal Direction. Patient makes appropriate decisions in order to solve routine  problems 75-90% of the time. Patient requires minimal/occasional direction for  the following behavior(s): Inhibiting impulsivity, Making decisions, Recognizing  risks, Planning tasks    MEMORY: Memory Score = 4, Minimal Prompting. Patient recognizes and remembers  75-90% of the time. Patient requires minimal/occasional prompting for memory for  the  following: Remembering immediate information/instructions (after minutes),  Remembering delayed information/instructions (after days)    IRFPAI Goals    Eating: 7  Grooming: 5  Bathing; 5  Upper Body Dressing: 7  Lower Body Dressing: 6  Toileting: 6  Mode of Bathing: S  Tub/Shower: 5  Toilet Transfers: 6    Short Term Goals:  Not applicable.  Long Term Goals:   Time frame to achieve long term goal(s):   7-10 days from IE on 08/26/16       1. Ms. Wernette will improve standing balance and perform LB dressing with  Min A.       2. Ms. Carlini will improve fxl endurance and perform full shower/dressing  routine with no more than Supervision.       3. Ms. Perrow will improve safety and verbalize 3 controllable risk  factors to prevent secondary CVA.       4. Ms. Marceaux caregivers will complete caregiver training and  demonstrate competence in safely assisting/supervising pt prior to d/c.    CARE Tool  Mobility Performance:   Toilet Transfer Helper does less than half the effort. Helper lifts, holds or  supports trunk or limbs but provides less than half the effort.  Self Care Discharge Goals:   Lower Body Dressing: Helper sets up or cleans up; patient completes activity.  Helper assists only prior to or following the activity.      Risks/Benefits of Rehabilitation Discussed with Patient/Caregiver: Yes.  Recommendations/Goals for Rehabilitation Discussed with Patient/Caregiver: Yes.    PLAN  Occupational Therapy Plan: Occupational Therapy is recommended.  Recommended Frequency/Duration/Intensity: 60-120 mins/day, 5-6 days/wk, for 7-10  days from IE on 08/26/16  Activities Contributing Toward Care Plan: ADL training, safety and mobility  training, AE education, DME education, dynamic balance training, CVA education,  caregiver training prn, d/c planning, plan of care NMRE  The patient is not appropriate for treatment in group format.    Team Care Plan  Please review Integrated Patient View Care Plan Flowsheet for Team  identified  Problems, Interventions, and Goals.    Identified problems from team documentation:  Problem: Impaired Bladder Management  Bladder Mgmt: Primary Team Goal: Patient will be continent of bladder./    Problem: Impaired Bowel Management  Bowel Mgmt: Primary Team Goal: Patient will remain continent of bowel until  discharge./    Problem: Impaired Pain Management  Pain Mgmt: Primary Team Goal: Patient will verbalized pain less than 2 out of 10  with the use of medication and positioning techniques.Marland Kitchen/    Problem: Safety Risk and Restraint  Safety: Primary Team Goal: Patient will recognize limitations and call for  assistance before attempting mobility 100% of the time in order to prevent  fall./    Identified problems from this assessment:     Self Care Management : Ms. Mcvicar will perform basic UB/LB dressing at MOD I.    Discipline:  Occupational Therapy    3 Hour Rule Minutes: 60 minutes of OT treatment this session count towards  intensity and duration of therapy requirement. Patient was seen for the full  scheduled time of OT treatment this session.  Therapy Mode Minutes: Individual: 60 minutes.  Signed by: Melinda Crutch, OTR/L 08/26/2016 9:00:00 AM

## 2016-08-26 NOTE — Rehab Evaluation ([facility]) (Addendum)
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 Found by family in her room  struggling to put on her shirt, not speaking or responding properly. She was  outside window for lytics.  Past Medical History of DM2, HTN, HLD, CAD with CABG, and Recurrent UTIs (s/p  Renal Stent 08/05/16).    Neurological: Acute CVA: with mild right sided deficits, cardioembolic in  etiology    Cardiac workup: Found to have Large RA mass occupying much of the RA and  Possible interatrial shunt  Right Atrial Myxoma found on Echo and confirmed on TEE.  TEE confirms: Large RA mass taking up most or FRA cavity but not obstructing or  prolapsing across the TV orifice. Size: at least 5.5 cm in height and 3.5 cm in  width.  Cardiac Cath was completed and patient cleared for surgery  Now s/p atrial septal mass excision via right thoracotomy and PFO closure on  08/21/16    Cardiovascular:SR 80's EF: 65%, Moderate Aortic Stenosis  CAD s/p 4V-CABG in 2010 [facility], [place])  Left Heart Cath 08/17/16 - 3/4 bypass grafts patent with chronic occlusion of RCA  and right coronary bypass graft - stable anatomy.  HTN: Continue Metoprolol, norvasc added due to episode of hypertention  overnight.  Plavix  started per neurology recommendations.  Core Measures: Aspirin - Y; Beta blocker - Y; ACE - N; Statin - Y    Pulmonary: Weaned to RA  Chest tubes: removed on 08/23/16  Chest X-ray with Minimal bibasilar atelectasis, encourage Incentive Spriometry  use.    Renal/GU: UTI Completed course of IV Ceftriaxone  Obstructive uropathy recent ureteral stenting 07/2016, creat falling but  fluctuating: from 1.3-1.8  Prior renal artery stenosis w stenting  AKI on CKD: resolving, Cr 1.7,  Incontinence yesterday no reports overnight.    Hematological:Hct 10/32, on ASA  DVT prophylaxis: SCDs, Hep SQ    Infectious Disease: Afebrile, WBC:12 down trending.    Endocrinological: DM: A1c 7.7,  Continue Sliding Scale Insulin  Endocrine consult completed    Pain is well controlled. Paravertebral pain pump removed 08/24/16, did not  require pain medication overnight.  Evaluated by therapy with recommendations for acute rehab level of care. Patient  was Mod I for mobiliy and Independent for ADL's. Requires ongoing medical  managment of current condition and comorbid conditons. Medically stable for  transfer to acute rehab 08/25/16.   Date of Onset: 08/08/16   Date of Admission: 08/25/2016 7:16:00 PM  Premorbid Functional Level: Patient was modified independent with a RW or SPC  for mobility, living with one of her sons (in either [place] or [place])  Home  Environment: Patient is able to stay on one level of either of her son's  homes, with access to full bathroom, although they are both multi-level.  Although living with her son previously, patient was functioning at a mod I  level, so caregiver availability will need to be determined.    Medications and Allergies: Significant rehabilitation considerations:   See EPIC.  Rehabilitation Precautions/Restrictions:   Falls, aspiration (on regular consistency consistent carbohydrate diet), DM,  HTN, 3 toes on left foot amputated    SUBJECTIVE  Patient/Caregiver Goals:  Patient's functional  goals: "To be able to walk  without any device".  Pain: Patient initially reporting no pain at rest, but indicating she does  experience right shoulder pain with movement past approximately 80 degrees of  flexion due to frozen shoulder which has been present for several months.  Patient also later reports experiencing some back pain (present prior to this  hospital admission) when ambulating with RW.  Pain Reassessment: Pain was not reassessed as no pain was reported.    OBJECTIVE  General Observations: Patient received sitting in w/c at nurses' station, ready  for P.T. with minimal-moderate swelling noted to be present in right hand (with  patient reporting this has only been present since 08/25/16), and left LE shorter  than right LE from hip to knee (patient reporting this has been the case since  she underwent TKA on left LE).  Understanding of Current Condition: Patient alert and oriented and understands  present medical status.  Vital Signs:                       Before Activity              After Activity  Vitals  Time                 1105                         1200  Position/Activity    sitting                      sitting  BP Systolic          139                          151  BP Diastolic         72                           70  Pulse                68                           66    Communication/Cognition: Patient is alert and oriented to person, place, and  situation, following simple commands well without need for repetition, behaving  appropriately throughout evaluation and displaying good safety awareness.  Sensation: Intact to light touch throughout bilateral UE and proximal aspects of  bilateral LE, with inconsistent testing on distal aspects of bilateral LE.  Range of Motion: AROM is WFL throughout bilateral LE and left UE and right  elbow, wrist, and hand with right shoulder limited to approximately 70-75  degrees of flexion actively.  Strength/Motor Control: Patient demonstrates 4/5 strength  throughout bilateral  LE and  left UE and right elbow/wrist but right shoulder strength is limited to  2/5 due to pain/frozen shoulder.  Balance: Sitting balance is good. Static standing balance is poor-fair with  patient able to maintain stance for only 20-30 seconds without UE support,  tending to lean posteriorly onto heels.  Dynamic standing balance is poor,  although patient is able to ambulate with support of RW times 30 feet with CGA.      Therapeutic/Functional Mobility:            Bed Mobility: Patient is able to roll in both directions and scoot on  mat with supervision. Patient moves from sitting to/from supine/sidelying with  CGA.            Transfers: Patient transfers from sitting to standing to RW/HHA with  CGA and from w/c to mat with minA for steadying.            Locomotion/Wheelchair: Not assessed due to time constraints.            Locomotion/Gait: Patient ambulates 30 feet with RW with CGA.            Stairs: Not attempted this session.    Interventions:  Moderate Complexity Evaluation: A history of present problem with 1-2 personal  factors and/or comorbidities that impact the plan of care  An examination of body systems using standardized tests and measures in  addressing a total of 3 or more elements from any of the following body  structures and functions, activity limitations, and/or participation  restrictions  An evolving clinical presentation with changing characteristics  Clinical decision-making of moderate complexity using standardized patient  assessment instrument and/or measurable assessment of functional outcome No  treatment provided today.  Patient was left seated in chair in his/her room. Handoff to nurse/nursing  Cytogeneticist completed.  Chair alarm in place and activated.  Oriented to call bell and placed within reach.  Personal items within reach.  Assistive devices positioned out of reach.    Interdisciplinary Educational Needs and Learning Preferences:       Learning  Preference: The patient's preferred learning method is:  Explanation.  The patient's preferred learning method is: Demonstration.   Practice.       Barriers to Learning: No barriers.       Learning Needs: Equipment, Functional activities/mobility, Plan of care,  Rehabilitation techniques and procedures, Stroke    Education Provided: Plan of care. Bed mobility. Functional transfers. Gait.       Audience: Patient.       Mode: Explanation.  Demonstration.   Practice.       Response: Applied knowledge.  Verbalized understanding.  Needs practice.  Needs reinforcement.    ASSESSMENT  Summary of Deficits and Prognosis: Patient presents to rehab unit s/p left MCA  CVA with resulting impaired standing balance and decreased independence with  functional mobility. Patient's history of three amputated toes on left foot also  impacts patient's standing balance and patient does report having approximately  two falls/month prior to sustaining thie CVA, for which patient needed  assistance to get up from the floor.  Patient is very motivated to participate  with P.T. to gain independence and should make some progress on rehab unit with  improving standing balance and mobility, but Glenda Green still require supervision for  some aspects of mobility after d/c from rehab.  Rehab Potential: Able to participate in an intensive inpatient interdisciplinary  rehabilitation program, Good family/social support, Living in the community  premorbidly,  Motivated  Barriers to Progress/Discharge: Functional status, Medical condition, Premorbid  level of function    Functional Measures      TRANSFERS BED/CHAIR/WHEELCHAIR: Bed/chair/wheelchair Transfer Score = 4.  Patient performs 75% or more of effort and minimal assistance (little/incidental  help/lifting of one limb/steadying) for transferring to and from the  bed/chair/wheelchair, requiring: Steadying.  Patient requires the following assistive device(s): Walker.  Elevated bed/surface.  Arm  rest.    LOCOMOTION WHEELCHAIR:   Wheelchair did not occur.    LOCOMOTION WALK:   Walk Distance Scale = 1.  Distance walked is less than 50 feet. Walk Score = 1.   Incidental assistance with lifting, contact guard or steadying was provided.  Patient performs 75% or more of effort and requires minimal contact assistance  for walking. Patient walked a distance of 30 feet. Rolling walker.    LOCOMOTION STAIRS: Activity was not assessed.    COMPREHENSION: Auditory comprehension is the usual mode. Patient does not  comprehend complex/abstract information in their primary language without  assistance from a helper. Comprehension Score = 5, Supervision. Patient  comprehends basic daily needs or ideas greater than 90% of the time. Patient  requires stand by/rare prompting.  Patient has the following assistive device(s) or limitations: Auditory  processing    EXPRESSION: Vocal expression is the usual mode. Patient does not express  complex/abstract information in their primary language without a helper.  Expression Score = 4, Minimal Prompting. Patient expresses basic daily needs or  ideas 75-90% of the time.  Patient requires minimal/occasional prompting.  Patient has the following assistive device(s) or limitations: Aphasia and/or  apraxia    SOCIAL INTERACTION: Social Interaction Score = 6, Modified Independent.  Patient  is modified independent for social interaction, requiring: Requires additional  time.    PROBLEM SOLVING: Patient does not make appropriate decisions in order to solve  complex problems without assistance from a helper. Problem Solving Score = 4,  Minimal Direction. Patient makes appropriate decisions in order to solve routine  problems 75-90% of the time. Patient requires minimal/occasional direction for  the following behavior(s): Self correcting performance    MEMORY: Memory Score = 5, Supervision.  Patient recognizes and remembers with  prompting only under stressful or unfamiliar conditions, but no  more than 10% of  the time, for the following behavior(s): Remembering immediate  information/instructions (after minutes)    PAI Expected Mode of Locomotion at Discharge: The expected mode of most  frequently used locomotion, at discharge, is expected to be walking.    IRFPAI Goals    Bed, Chair, Wheelchair Transfers: 6  Mode of Locomotion: W  Walk/Wheelchair: 5  Stairs: 4    CARE Tool  Mobility Performance:   Roll Left and Right: Patient completed the activities by him/herself with no  assistance from a helper.   Sit to Lying: Helper provides verbal cues or touching/steadying assistance as  patient completes activity.   Lying to Sitting on Side of Bed: Helper provides verbal cues or  touching/steadying assistance as patient completes activity.   Sit to Stand: Helper provides verbal cues or touching/steadying assistance as  patient completes activity.   Chair/Bed to Chair Transfer: Helper provides verbal cues or touching/steadying  assistance as patient completes activity.   Car Transfer: Not attempted due to medical or safety concerns.    Patient Walks: Yes   Walk 10 Feet: Helper provides verbal cues or touching/steadying assistance as  patient completes activity.   Walk 50 Feet With 2 Turns: Not  attempted due to medical or safety concerns.   Walk 150 Feet: Not attempted due to medical or safety concerns.   Walking 10 Feet on Uneven Surfaces: Not attempted due to medical or safety  concerns.   1 Step Over Curb or Up/Down Stair: Not attempted due to medical or safety  concerns.   4 Steps Up and Down, With/Without Rail: Not attempted due to medical or safety  concerns.   12 Steps Up and Down, With/Without Rail: Not attempted due to medical or safety  concerns.   Picking up an Object: Not attempted due to medical or safety concerns.    Uses Wheelchair/Scooter: No  Mobility Discharge Goals (not met at this time):   Sit to Stand: Patient completes the activities by him/herself with no  assistance from a helper.   Walk  Discharge Goals:   Walk 150 Feet: Helper provides verbal cues or touching/steadying assistance as  patient completes activity.   12 Steps Up and Down, With/Without Rail: Helper provides verbal cues or  touching/steadying assistance as patient completes activity.    Short Term Goals:  Not applicable.  Long Term Goals:   Time frame to achieve long term goal(s): 7-10 days from  08/26/2016       1. Patient will transfer from sitting to standing to LRAD with mod I so  patient can toilet independently.       2. Patient will ambulate 50 feet with LRAD with mod I so patient can  complete household mobility on one level.       3. Patient will ambulate 150 feet with LRAD with SBA so patient can access  community for MD appointments.       4.  Patient will climb 12 steps with unilateral HR with CGA so patient can  access all levels of sons' homes.       5. Patient's family will demonstrate independence providing patient with  any needed supervision-assistance with functional mobility prn.    Risks/Benefits of Rehabilitation Discussed with Patient/Caregiver: Yes.  Recommendations/Goals for Rehabilitation Discussed with Patient/Caregiver: Yes.    PLAN  Physical Therapy Plan: Physical Therapy is recommended.  Recommended Frequency/Duration/Intensity: daily for 60 minute sessions 5-6  days/week for 7-10 days from 08/26/2016.  Activities Contributing Toward Care Plan: transfer training, gait training, NMR  addressing standing balance and LE motor control, patient/family education,  equipment procurement prn, and d/c planning, all provided within an  interdisciplinary, team approach to care.  The patient is not appropriate for treatment in group format.    Team Care Plan  Please review Integrated Patient View Care Plan Flowsheet for Team identified  Problems, Interventions, and Goals    Identified problems from team documentation:  Problem: Impaired Bladder Management  Bladder Mgmt: Primary Team Goal: Patient will be continent of  bladder at least  75% of the time with timed tolieting/Active    Problem: Impaired Bowel Management  Bowel Mgmt: Primary Team Goal: Patient will remain continent of bowel until  discharge./Active    Problem: Impaired Communication  Communication: Primary Team Goal: Caregiver will demonstrate appropriate cueing  (i.e. probing questions) to address word finding difficutly in order to  facilitate patient's communicaiton of wants/needs in community./    Problem: Impaired Pain Management  Pain Mgmt: Primary Team Goal: Patient will verbalized pain less than 2 out of 10  with the use of medication and positioning techniques.Marland KitchenJorje Guild    Problem: Impaired Self-care Mgmt/ADL/IADL  Self Care: Primary Team Goal: Ms. Messing will perform basic UB/LB  dressing at  MOD I./Active    Problem: Safety Risk and Restraint  Safety: Primary Team Goal: Patient will recognize limitations and call for  assistance before attempting mobility 100% of the time in order to prevent  fall./Active    Identified problems from this assessment:     Mobility : Patient will transfer from sitting to standing and ambulate 50 feet  with LRAD with mod I, ambulate 150 feet with LRAD with supervision, and climb 12  steps with unilateral HR with CGA so patient can maintain modified independence  on one level and access all levels of sons' homes.    Discipline:  Physical Therapy    3 Hour Rule Minutes: 60 minutes of PT treatment this session count towards  intensity and duration of therapy requirement. Patient was seen for the full  scheduled time of PT treatment this session.  Therapy Mode Minutes: Individual: 60 minutes.    Signed by: Dorothey Baseman, PT, DPT 08/26/2016 12:00:00 PM

## 2016-08-27 LAB — CBC WITH MANUAL DIFFERENTIAL
Absolute NRBC: 0 10*3/uL
Band Neutrophils Absolute: 0.79 10*3/uL (ref 0.00–1.00)
Band Neutrophils: 6 %
Basophils Absolute Manual: 0 10*3/uL (ref 0.00–0.20)
Basophils Manual: 0 %
Cell Morphology: NORMAL
Eosinophils Absolute Manual: 0.13 10*3/uL (ref 0.00–0.70)
Eosinophils Manual: 1 %
Hematocrit: 33.2 % — ABNORMAL LOW (ref 37.0–47.0)
Hgb: 11 g/dL — ABNORMAL LOW (ref 12.0–16.0)
Lymphocytes Absolute Manual: 0.66 10*3/uL (ref 0.50–4.40)
Lymphocytes Manual: 5 %
MCH: 28.9 pg (ref 28.0–32.0)
MCHC: 33.1 g/dL (ref 32.0–36.0)
MCV: 87.4 fL (ref 80.0–100.0)
MPV: 10.9 fL (ref 9.4–12.3)
Monocytes Absolute: 1.06 10*3/uL (ref 0.00–1.20)
Monocytes Manual: 8 %
Neutrophils Absolute Manual: 10.55 10*3/uL — ABNORMAL HIGH (ref 1.80–8.10)
Nucleated RBC: 0 /100 WBC (ref 0.0–1.0)
Platelets: 260 10*3/uL (ref 140–400)
RBC: 3.8 10*6/uL — ABNORMAL LOW (ref 4.20–5.40)
RDW: 15 % (ref 12–15)
Segmented Neutrophils: 80 %
WBC: 13.19 10*3/uL — ABNORMAL HIGH (ref 3.50–10.80)

## 2016-08-27 LAB — COMPREHENSIVE METABOLIC PANEL
ALT: 16 U/L (ref 0–55)
AST (SGOT): 10 U/L (ref 5–34)
Albumin/Globulin Ratio: 0.9 (ref 0.9–2.2)
Albumin: 2.7 g/dL — ABNORMAL LOW (ref 3.5–5.0)
Alkaline Phosphatase: 78 U/L (ref 37–106)
Anion Gap: 10 (ref 5.0–15.0)
BUN: 29 mg/dL — ABNORMAL HIGH (ref 7–19)
Bilirubin, Total: 0.4 mg/dL (ref 0.2–1.2)
CO2: 24 mEq/L (ref 22–29)
Calcium: 8.7 mg/dL (ref 7.9–10.2)
Chloride: 102 mEq/L (ref 100–111)
Creatinine: 1.7 mg/dL — ABNORMAL HIGH (ref 0.6–1.0)
Globulin: 3 g/dL (ref 2.0–3.6)
Glucose: 215 mg/dL — ABNORMAL HIGH (ref 70–100)
Potassium: 4.9 mEq/L (ref 3.5–5.1)
Protein, Total: 5.7 g/dL — ABNORMAL LOW (ref 6.0–8.3)
Sodium: 136 mEq/L (ref 136–145)

## 2016-08-27 LAB — GLUCOSE WHOLE BLOOD - POCT
Whole Blood Glucose POCT: 84 mg/dL (ref 70–100)
Whole Blood Glucose POCT: 217 mg/dL — ABNORMAL HIGH (ref 70–100)
Whole Blood Glucose POCT: 252 mg/dL — ABNORMAL HIGH (ref 70–100)
Whole Blood Glucose POCT: 229 mg/dL — ABNORMAL HIGH (ref 70–100)
Whole Blood Glucose POCT: 411 mg/dL — ABNORMAL HIGH (ref 70–100)
Whole Blood Glucose POCT: 433 mg/dL — ABNORMAL HIGH (ref 70–100)

## 2016-08-27 LAB — GFR: EGFR: 29

## 2016-08-27 MED ORDER — INSULIN GLARGINE 100 UNIT/ML SC SOLN
12.0000 [IU] | Freq: Every evening | SUBCUTANEOUS | Status: DC
Start: 2016-08-27 — End: 2016-09-04
  Administered 2016-08-27 – 2016-09-03 (×8): 12 [IU] via SUBCUTANEOUS
  Filled 2016-08-27 (×8): qty 12

## 2016-08-27 MED ORDER — CEFDINIR 300 MG PO CAPS
300.0000 mg | ORAL_CAPSULE | ORAL | Status: DC
Start: 2016-08-27 — End: 2016-08-29
  Administered 2016-08-27 – 2016-08-29 (×3): 300 mg via ORAL
  Filled 2016-08-27 (×4): qty 1

## 2016-08-27 MED ORDER — RISAQUAD PO CAPS
1.0000 | ORAL_CAPSULE | Freq: Every day | ORAL | Status: AC
Start: 2016-08-27 — End: 2016-09-01
  Administered 2016-08-28 – 2016-09-01 (×5): 1 via ORAL
  Filled 2016-08-27 (×5): qty 1

## 2016-08-27 NOTE — Rehab Progress Note ([facility]) (Signed)
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  Pain Reassessment: Pain was not reassessed as no pain was reported.  Patient/Caregiver Goals:  "To be able to walk without any device".    NEURO  Orientation/Awareness: Alert and Oriented x3.  Speech: Clear at times.  Behavior: Cooperative.  Poor Judgment.    MEDICATIONS  IV Access: No IV access.  Dialysis Access: Patient does not have dialysis access.      Elopement Risk Level Assessment Tool  Patient Criteria:  CATEGORY 1 CRITERIA:   No category 1 criteria. Elopement Risk:  No      RISK ASSESSMENT FOR FALLS/INJURY    MENTAL STATUS CRITERIA:   10 - Impaired Judgement/impulsive.   MENTAL STATUS TOTAL: 10    AGE CRITERIA:   69 - 84 years old and above  AGE TOTAL: 2    ELIMINATION CRITERIA:   5 - Incontinence.   3 - Toileting with Assistance.   ELIMINATION TOTAL: 8    HISTORY OF FALLS CRITERIA:   2 - Unknown History.  HISTORY OF FALLS TOTAL: 2    MEDICATIONS CRITERIA:   1 High Risk Medication (*see list below)   MEDICATIONS TOTAL: 1    PHYSICAL MOBILITY CRITERIA:   3 - Decreased balance reaction.   1 - Weakness/impaired physical mobility.   PHYSICAL MOBILITY TOTAL: 4    FALLS RISK ASSESSMENT TOTAL: 27    Patient's Fall Risk: TOTAL SCORE >10: High Risk    Falls Interventions: Yellow fall risk  marker applied to patient identification  band  Oriented to call bell and placed within reach.  Personal items within reach.  Hourly toileting/safety checks between 6am and 10pm, then every 2 hours.  Bed alarm in place and activated.  Chair alarm in place and activated.  Initiate fall care plan and outcome.  Clutter removed and clear path to BR.  Assistive devices positioned out of reach.  Reoriented PRN.    NUTRITION  Diet:  Type: Consistent carbohydrate. Regular.  Food Consistency: Regular.  Liquid Consistency: Thin.      CARDIOVASCULAR     Bilateral lower extremities  Nail Bed Color: Pink.  Pulses:   Apical Pulse: Regular. Strong. Rate is 80 .   Patient does not have a pacemaker.   Patient does not have a defibrillator.    CARDIOPULMONARY  Lung Sounds:   Upper lobes. Clear.   Lower lobes. Clear.  Type of Respirations: Regular.  Cough: No cough noted.  Respiratory Support: The patient does not require any respiratory support.  Respiratory Equipment: None. Denies any distress with resp[iration.    INTEGUMENTARY  Skin:  Temperature: Warm  Turgor: Normal for age  Moisture: Dry  Color  of skin: Normal for Race/Ethnicity  Capillary Refill: Less than 3 seconds   Bruising: scartered bruises to both extremities  Wounds/Incisions: No wounds or incisions.  Braden Scale for Predicting Pressure Sore Risk: Sensory Perception: Slightly  limited  Moisture: Occasionally moist  Activity: Chairfast  Mobility: Slightly limited  Nutrition: Adequate  Friction and Shear: Potential problem  Braden Score: 16  Level of Risk: At risk (15-18)   Incontinent Protocol   Turn patient every 2 hours   Assist patient to the bathroom every 2 hours    GASTROINTESTINAL  Abdomen: Soft. Nontender.  Bowel Sounds:  Active bowel sounds audible in all four quadrants.  Date of Last Bowel Movement:  08/27/16  Current Bowel Pattern:  Incontinent   No Problems/Complaints with Bowel Elimination Assessed.    GENITOURINARY  Current Bladder Pattern:  Incontinent  Color:  Amber   Problems/Complaints with Urination: Incontinence    Bowel and Bladder Output:                       Bladder (# only)             Bowel (# only)  Number of Episodes  Continent            0                            0  Incontinent          2                            2    MUSCULOSKELETAL  Upper Body: Right hand swollen and weak  Lower Body: right leg weakness    Functional Measures       Bladder Score = 2. Patient performs 25-49% of tasks and requires maximal  assistance for bladder management. Helper provides most of the assist to apply  AND remove brief.    BLADDER ACCIDENTS THIS SHIFT:  0 . Patient has not had an accident but used an  adult brief this assessment.     Bowel Score = 2. Patient performs 25-49% of tasks and requires maximal  assistance for bowel management. Helper provides most of the assist to apply AND  remove brief.    BOWEL ACCIDENTS THIS SHIFT: 0 . Patient has not had an accident, but used a  stool softener.    Education Provided:    Education Provided: Precautions. Pain management. Pain scale. Side effects.  Medication options. Clinical indicators of pain. Plan of care. Safety. Swallow  precautions. Stroke. Types of stroke, Signs /T/ Symptoms associated with stroke,  Stroke prevention, Medication compliance, Healthy diet, Increase physical  activity, Reduce stress, Pressure ulcer, Prevention of contractures/subluxation       Audience: Patient.       Mode: Explanation.       Response: Verbalized understanding.    Discharge: Patient is not being discharged at this time.    Long Term Goals: 1. Patient will be continent with bladder 100% most of the  time.   2. Patient will remain continent of bowel until discharge.   3. Patient will recognize limitations and call for assistance before attempting  mobilty 100% of the time in order to prevent falls.   4. Surgical wound will be clean with no drainage to minimize risk of infection.   5. Patient will verbalized pain level  2/10 after giving  medication and  repositioning techniques.  2 weeks after 08/25/2016  Short Term Goals: 1. Patient will call for assistance 75% of the time before  attempting transfer to prevent falls during hospitalization.   2. Patient will verbalize pain  less than 3 out of 10 with or without  medication.   3. Patient identifies correct drug indications, dosage, administration times  and side effects 75% on the medications.  1 week after 08/25/2016    PROGRESS TOWARD GOALS: Patient remain alert and responsive. denies any distress  with respiration now,denies any pain.reported will manage discomfort without  medication. patient BS coverage given as ordered, starting dose of antibiotics  Cefdinir given.education given and Patient and family verbalizes understanding.      Both long and Short term goals are in progress.    PLAN: Nursing Specific Interventions  Bladder Management. Bowel Management. Medication Management. Skin Management.  Continue with the current Nursing Plan of Care.    TEAM CARE PLAN  Identified problems from team documentation:  Problem: Impaired Bladder Management  Bladder Mgmt: Primary Team Goal: Patient will be continent of bladder at least  75% of the time with timed tolieting/Active    Problem: Impaired Bowel Management  Bowel Mgmt: Primary Team Goal: Patient will be able sustain bowel continence  with timed toileting at least 75% of the time/Active    Problem: Impaired Communication  Communication: Primary Team Goal: Caregiver will demonstrate appropriate cueing  (i.e. probing questions) to address word finding difficutly in order to  facilitate patient's communicaiton of wants/needs in community./    Problem: Impaired Mobility  Mobility: Primary Team Goal: Patient will transfer from sitting to standing and  ambulate 50 feet with LRAD with mod I, ambulate 150 feet with LRAD with  supervision, and climb 12 steps with unilateral HR with CGA so patient can  maintain modified independence on  one level and access all levels of sons'  homes./Active    Problem: Impaired Nutrition / Hydration  Nutrition: Primary Team Goal: Maintain meal intake of 75% or more through LOS/    Problem: Impaired Pain Management  Pain Mgmt: Primary Team Goal: Patient will verbalized pain less than 2 out of 10  with the use of medication and positioning techniques.Marland KitchenJorje Guild    Problem: Impaired Self-care Mgmt/ADL/IADL  Self Care: Primary Team Goal: Ms. Laser will perform basic UB/LB dressing at  MOD I./Active    Problem: Safety Risk and Restraint  Safety: Primary Team Goal: Patient will recognize limitations and call for  assistance before attempting mobility 100% of the time in order to prevent  fall./Active    Add/Update Problems from this assessment:  No updates at this time.    Please review Integrated Patient View Care Plan Flowsheet for Team identified  Problems, Interventions, and Goals.    Signed by: Lia Foyer, RN 08/27/2016 6:14:00 PM

## 2016-08-27 NOTE — Progress Notes (Signed)
Full to follow. 30 F w/ DM, obstructive uropathy relieved by stent 08/05/16 w/ neg UCx at the time while on Nitrofurantoin and transefered to rehab s/p Cardiac surgery. She is incontinent. No GU Sx or signs on exam. Afebrile, non toxic and with min leukocytosis but significant pyuria. She has seen a lot of ABx over the last yr. Given clinical stability will start w/ Omnicef and avoid IVs unless she becomes more toxic. She will likely need the stent changes before stopping the ABx as it is probably colonized.  Discussed the above with the patient's family member in the room and answered her questions as well as the patient's.

## 2016-08-27 NOTE — Rehab Progress Note ([facility]) (Addendum)
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  Pain: Patient currently without complaints of pain.  Pain Reassessment: Pain was not reassessed as no pain was reported.  Patient/Caregiver Goals:  "To be able to walk without any device".    NEURO  Orientation/Awareness: Alert and Oriented x3.  Speech: Clear.  Behavior: Cooperative.    MEDICATIONS  IV Access: No IV access.  Dialysis Access: Patient does not have dialysis access.      Elopement Risk Level Assessment Tool  Patient Criteria: Patient is not capable of leaving the unit.  Assessment is not  applicable.      RISK ASSESSMENT FOR FALLS/INJURY    MENTAL STATUS CRITERIA:   0 - None identified.  MENTAL STATUS TOTAL: 0    AGE CRITERIA:   30 - 59 years old and above  AGE TOTAL: 2    ELIMINATION CRITERIA:   5 - Incontinence.   ELIMINATION TOTAL: 5    HISTORY OF FALLS CRITERIA:   10 - Has fallen two or more times.  HISTORY OF FALLS TOTAL: 10    MEDICATIONS CRITERIA:   2 or more High Risk Medications (*see list below)   MEDICATIONS TOTAL: 2    PHYSICAL MOBILITY CRITERIA:   3 - Decreased balance reaction.   1 - Weakness/impaired physical mobility.   PHYSICAL MOBILITY TOTAL: 4    FALLS RISK ASSESSMENT TOTAL: 23    Patient's Fall Risk: TOTAL SCORE >10: High Risk    Falls Interventions: Yellow fall risk marker applied to patient  identification  band  Oriented to call bell and placed within reach.  Personal items within reach.  Hourly toileting/safety checks between 6am and 10pm, then every 2 hours.  Bed alarm in place and activated.  Chair alarm in place and activated.  Clutter removed and clear path to BR.  Assistive devices positioned out of reach.  Patient and family education.   intentional rounding    NUTRITION  Diet:  Type: Consistent carbohydrate.  Food Consistency: Regular.  Liquid Consistency: Thin.      CARDIOVASCULAR     Bilateral lower extremities  Nail Bed Color: Pink.   No edema or redness present.  Pulses:   Apical Pulse: Regular. Strong. Rate is 81 .   Patient does not have a pacemaker.   Patient does not have a defibrillator.    CARDIOPULMONARY  Lung Sounds:   Upper lobes. Clear.   Lower lobes. Clear.  Type of Respirations: Regular.  Cough: No cough noted.  Respiratory Support: The patient does not require any respiratory support.  Respiratory Equipment: None. 94%    INTEGUMENTARY  Skin:  Temperature: Warm  Turgor: Normal for age  Moisture: Dry  Color of skin: Normal for Race/Ethnicity  Capillary Refill:  Less than 3 seconds   Bruising: Scattered bruises to chest, abdomen and both extremities.   Non blachable redness on the bottom, covered with mepilex.  Wounds/Incisions:     Pressure ulcer. sacral   Stage 1 - Non-Blanchable Erythema.  Length: 4 centimeters    Width: 3 centimeters    Depth: No.  Wound Surface Area:  12 cm   Undermining: No  Tunneling:  No  Exudate: None  Odor: No  Peri-Wound Erythema: Yes   Care provided per protocol.       Surgical Incision: under right breast incision Length: 11 centimeter(s) with  sutures. No signs of infection.  Drainage: Incision without drainage.  Odor:  No  Incision Care: Per protocol.     Surgical Incision: Pubic incision. Length: 5 centimeter(s) with glue. No signs  of infection.  Drainage: Incision without drainage.  Odor:  No  Incision Care: Per protocol.     Wound/incision not  assessed this shift.   4 more  surgical incisions noted to above and under right breast covered with  dressings, clean, dry and intact.  Braden Scale for Predicting Pressure Sore Risk: Sensory Perception: No  impairment  Moisture: Occasionally moist  Activity: Chairfast  Mobility: Slightly limited  Nutrition: Adequate  Friction and Shear: Potential problem  Braden Score: 17  Level of Risk: At risk (15-18)   Turn patient every 2 hours   Assist patient to the bathroom every 2 hours   Incontinent Protocol    GASTROINTESTINAL  Abdomen: Soft. Nontender.  Bowel Sounds:  Active bowel sounds audible in all four quadrants.  Date of Last Bowel Movement:  08/26/2016  Current Bowel Pattern:  Incontinent   No Problems/Complaints with Bowel Elimination Assessed.    GENITOURINARY  Current Bladder Pattern: Incontinent  Color:  Yellow   Problems/Complaints with Urination: Incontinence    Bowel and Bladder Output:                       Bladder (# only)             Bowel (# only)  Number of Episodes  Continent            0                            0  Incontinent          1                            0    MUSCULOSKELETAL  Upper Body: Right hand swollen and weak  Lower Body: right leg weakness, 3 toes ampution on the right foot    Functional Measures       Bladder Score = 1. Patient performs less than 25% of tasks and requires total  assistance for bladder management. Helper provides total assist to completely  apply and remove brief.    BLADDER ACCIDENTS THIS SHIFT:  0 . Patient has not had an accident but used an  adult brief this assessment.     Bowel Score = 5.  Patient is supervision/set-up for bowel management,  requiring: Setting out equipment.  Emptying equipment.  Patient requires the following assistive device(s): Bucket of bedside commode.  No BM this shift but patient continent with bowels.    BOWEL ACCIDENTS THIS SHIFT: 0 . Patient has not had an accident and did not  require medications or devices.    COMPREHENSION:  Auditory comprehension is the usual mode. Comprehension Score =  6, Modified Independence.  Patient comprehends complex/abstract information in  their primary language, requiring: Additional time.    EXPRESSION: Vocal expression is the usual mode. Expression Score = 7,  Independent.  Patient expresses complex/abstract information in their primary  language.  Patient is completely independent for vocal expression.  There are no  activity limitations.    SOCIAL INTERACTION: Social Interaction Score = 7, Independent. Patient is  completely independent for social interaction.  There are no activity  limitations.    PROBLEM SOLVING: Patient does not make appropriate decisions in order to solve  complex problems without assistance from a helper. Problem Solving Score = 5,  Supervision.  Patient makes appropriate decisions in order to solve routine  problems with directing only under stressful or unfamiliar conditions, but no  more than 10% of the time, for the following behavior(s): Completing tasks,  Planning tasks    MEMORY: Memory Score = 4, Minimal Prompting. Patient recognizes and remembers  75-90% of the time. Patient requires minimal/occasional prompting for memory for  the following: Recognizing family and/or friends    Education Provided:    Education Provided: Precautions. Pain management. Pain scale. Medication  options. Clinical indicators of pain. Plan of care. Safety issues and  interventions. Fall protocol. Supervision requirements. Skin/wound care.  Incision care. Medication. Name and dosage. Administration. Purpose. Side  Effects.       Audience: Patient.       Mode: Explanation.       Response: Verbalized understanding.    Discharge: Patient is not being discharged at this time.    Long Term Goals: 1. Patient will be continent with bladder 100% most of the  time.   2. Patient will remain continent of bowel until discharge.   3. Patient will recognize limitations and call for assistance before  attempting  mobilty 100% of the time in order to prevent falls.   4. Surgical wound will be clean with no drainage to minimize risk of infection.   5. Patient will verbalized pain level 2/10 after giving medication and  repositioning techniques.  2 weeks after 08/25/2016  Short Term Goals: 1. Patient will call for assistance 75% of the time before  attempting transfer to prevent falls during hospitalization.   2. Patient will verbalize pain  less than 3 out of 10 with or without  medication.   3. Patient identifies correct drug indications, dosage, administration times  and side effects 75% on the medications.  1 week after 08/25/2016    PROGRESS TOWARD GOALS: Denies any pain. Able to use the call bell if needs  assistance.    PLAN: Nursing Specific Interventions  Bladder Management. Bowel Management. Medication Management. Skin Management.  Continue with the current Nursing Plan of Care.    TEAM CARE PLAN  Identified problems from team documentation:  Problem: Impaired Bladder Management  Bladder Mgmt: Primary Team Goal: Patient will be continent of bladder at least  75% of the time with timed tolieting/Active    Problem: Impaired Bowel Management  Bowel Mgmt: Primary Team Goal: Patient will be able sustain bowel continence  with timed toileting at least 75% of the time/Active    Problem: Impaired Communication  Communication: Primary Team Goal: Caregiver will demonstrate appropriate cueing  (i.e. probing questions) to address word finding difficutly in order to  facilitate patient's communicaiton of wants/needs in community./    Problem: Impaired  Mobility  Mobility: Primary Team Goal: Patient will transfer from sitting to standing and  ambulate 50 feet with LRAD with mod I, ambulate 150 feet with LRAD with  supervision, and climb 12 steps with unilateral HR with CGA so patient can  maintain modified independence on one level and access all levels of sons'  homes./Active    Problem: Impaired Nutrition /  Hydration  Nutrition: Primary Team Goal: Maintain meal intake of 75% or more through LOS/    Problem: Impaired Pain Management  Pain Mgmt: Primary Team Goal: Patient will verbalized pain less than 2 out of 10  with the use of medication and positioning techniques.Marland KitchenJorje Guild    Problem: Impaired Self-care Mgmt/ADL/IADL  Self Care: Primary Team Goal: Ms. Lahman will perform basic UB/LB dressing at  MOD I./Active    Problem: Safety Risk and Restraint  Safety: Primary Team Goal: Patient will recognize limitations and call for  assistance before attempting mobility 100% of the time in order to prevent  fall./Active    Add/Update Problems from this assessment:  No updates at this time.    Please review Integrated Patient View Care Plan Flowsheet for Team identified  Problems, Interventions, and Goals.    Signed by: Electa Sniff, RN 08/26/2016 10:45:00 PM

## 2016-08-27 NOTE — Rehab Progress Note ([facility]) (Signed)
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  Pain: Patient currently without complaints of pain.    OBJECTIVE    Interventions:       Speech Treatment: Pt initially presenting w/ decreased recall of previous  SLP evaluation though eventually able to verbalize "word finding difficulty" w/  cueing. Administration of select WAB subtest resulted in following score: 40/40  Comprehension of Sentences. Mild difficulty in decoding of written stimuli  though exacerbated by visual deficits (despite inclusion of reading glasses).  SLP instructed her to read aloud education materials including handout on L CVA  and compensatory word retrieval strategies. Difficulty noted in reading line to  line and dramatically improved w/implementation of cover sheet. SLP supplied  additional education on communication disorder including definition of anomia w/  corresponding printed material.  Patient was left seated in chair in his/her room. Handoff to nurse/nursing  Cytogeneticist completed.  Oriented to call bell and placed within reach.  Personal items within reach.  Pain Reassessment: Pain was not reassessed as no pain was reported.    Education Provided:    Education Provided: Plan of care. Communication strategies.       Audience: Patient.       Mode: Explanation.  Demonstration.  Teacher, English as a foreign language provided.       Response: Verbalized understanding.  Demonstrated skill.  Needs practice.  Needs  reinforcement.    ASSESSMENT  Pt receptive to strategies for anomia and providing appropriate corresponding  examples of use (e.g., description, substitution, etc). Verbal fluency grossly  intact for conversation level tasks this date. Homework assigned for reading  over communication education materials prior to next session.    Functional Measures  Patient was not assessed.    PLAN  Continued Speech Language Pathology is recommended to address:  Recommended Frequency/Duration/Intensity: 60 min/day, 5-6 days/week for 7-10  days from 08/26/2016, 1:1 and group tx as appropriate  Continued Activities Contributing Toward Care Plan: Cognitive-linguistic  assessment and treatment planning, dysphagia assessment, Patient/family  education, Plan of care coordination with care team, Discharge planning    3 Hour Rule Minutes: 60 minutes of SLP treatment this session count towards  intensity and duration of therapy requirement. Patient was seen for the full  scheduled time of SLP treatment this session.  Therapy Mode Minutes: Individual: 60 minutes.    Signed by: Trenda Moots, M.A., CCC/SLP 08/27/2016 10:00:00 AM

## 2016-08-27 NOTE — Rehab Progress Note ([facility]) (Signed)
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    OBJECTIVE  Vital Signs:                       Before Activity              After Activity  Vitals  Time                 1410                         1450  Position/Activity    seated                       supine  BP Systolic          141                          147  BP Diastolic         77                           88  Pulse                90                           -    Interventions:       Therapeutic Activities:  Pt seen initially bedside with handoff from PT  reporting incontinence with soiled clothing. Assisted pt with gathering soiled  laundry from yesterday and today and pt put in washing machine while seated  (front loading). Transitioned to OT gym with focus on activity tolerance  maintaining sitting balance unsupported during goal setting task. Pt required  prompting to answer survey questions as some responses corresponded to when she  was still working prior to retirement, not recently. Pt reports lack of  confidence in returning to PLOF with ADLs and agrees that she may need more  supervision at d/c. Pt reports fatigue after 20 mins and requests to lie down,  difficulty maintaining eyes open. Reviewed timed toileting tracking sheet to  decrease incontinent events. Will review with staff.  Patient was returned to or left in bed. Handoff to nurse/nursing Tax inspector completed.  Bed alarm in place and  activated.  Oriented to call bell and placed within reach.  Personal items within reach.  Assistive devices positioned out of reach.  Pain Reassessment: Pain was not reassessed as no pain was reported.    Education Provided:    Education Provided: Activities of daily living. Functional transfers. Plan of  care. Safety issues and interventions. Supervision requirements.       Audience: Patient.       Mode:  Explanation.  Demonstration.   rehab binder       Response: Needs practice.  Needs reinforcement.    ASSESSMENT  Pt limited this session by fatigue. Cognitive deficits impact pt's ability to  analyze current situation and set realistic goals, and memory impairments impact  pt's fluctuating responses. Anticipate pt will require increased supervision at  home for safety.    Functional Measures  Patient was not assessed.    PLAN  Continued Occupational Therapy is recommended.  Recommended Frequency/Duration/Intensity: 60-120 mins/day, 5-6 days/wk, for 7-10  days from IE on 08/26/16  Continued Activities Contributing Toward Care Plan: ADL training, safety and  mobility training, AE education, DME education, dynamic balance training, CVA  education, caregiver training prn, d/c planning, plan of care    3 Hour Rule Minutes: 60 minutes of OT treatment this session count towards  intensity and duration of therapy requirement. Patient was seen for the full  scheduled time of OT treatment this session.  Therapy Mode Minutes: Individual: 60 minutes.    Signed by: Melinda Crutch, OTR/L 08/27/2016 3:00:00 PM

## 2016-08-27 NOTE — Progress Notes (Signed)
[facility] Physiatry Attending Face to Face Progress  Note  [X] Discussed with nurse  .[x] No new events    Subjective:  Feels well;  has no new complaints.    No headache. No chest pain. No shortness of breath. No constipation.  Objective:  Vitals: BP 145/76   Pulse 80   Temp 98.4 F (36.9 C) (Oral)   Resp 16   Ht 1.626 m (5\' 4" )   Wt 74.6 kg (164 lb 7.4 oz)   SpO2 95%   BMI 28.23 kg/m   Appears well.In no apparent distress.Resting comfortably   Awake ; normal mood and affect.Sclerae anicteric.   Moist mucous membranes.   No respiratory distress .      New labs   Results     Procedure Component Value Units Date/Time    Glucose Whole Blood - POCT [[id number]]  (Abnormal) Collected:  08/27/16 1638     Updated:  08/27/16 1710     POCT - Glucose Whole blood 229 (H) mg/dL     Glucose Whole Blood - POCT [[id number]]  (Abnormal) Collected:  08/27/16 1126     Updated:  08/27/16 1129     POCT - Glucose Whole blood 252 (H) mg/dL     CBC WITH MANUAL DIFFERENTIAL [[id number]]  (Abnormal) Collected:  08/27/16 0546     Updated:  08/27/16 0725     WBC 13.19 (H) x10 3/uL      Hgb 11.0 (L) g/dL      Hematocrit 47.4 (L) %      Platelets 260 x10 3/uL      RBC 3.80 (L) x10 6/uL      MCV 87.4 fL      MCH 28.9 pg      MCHC 33.1 g/dL      RDW 15 %      MPV 10.9 fL      Nucleated RBC 0.0 /100 WBC      Absolute NRBC 0.00 x10 3/uL      Segmented Neutrophils 80 %      Band Neutrophils 6 %      Lymphocytes Manual 5 %      Monocytes Manual 8 %      Eosinophils Manual 1 %      Basophils Manual 0 %      Abs Seg Manual 10.55 (H) x10 3/uL      Bands Absolute 0.79 x10 3/uL      Absolute Lymph Manual 0.66 x10 3/uL      Monocytes Absolute 1.06 x10 3/uL      Absolute Eos Manual 0.13 x10 3/uL      Absolute Baso Manual 0.00 x10 3/uL      Cell Morphology: Normal    Comprehensive metabolic panel [[id number]]  (Abnormal) Collected:  08/27/16 0546    Specimen:  Blood Updated:  08/27/16 0649     Glucose 215 (H) mg/dL      BUN 29 (H) mg/dL      Creatinine 1.7 (H)  mg/dL      Sodium 643 mEq/L      Potassium 4.9 mEq/L      Chloride 102 mEq/L      CO2 24 mEq/L      Calcium 8.7 mg/dL      Protein, Total 5.7 (L) g/dL      Albumin 2.7 (L) g/dL      AST (SGOT) 10 U/L      ALT 16 U/L      Alkaline Phosphatase 78 U/L  Bilirubin, Total 0.4 mg/dL      Globulin 3.0 g/dL      Albumin/Globulin Ratio 0.9     Anion Gap 10.0    GFR [[id number]] Collected:  08/27/16 0546     Updated:  08/27/16 0649     EGFR 29.0    Glucose Whole Blood - POCT [[id number]]  (Abnormal) Collected:  08/27/16 0521     Updated:  08/27/16 0549     POCT - Glucose Whole blood 217 (H) mg/dL     Glucose Whole Blood - POCT [[id number]] Collected:  08/27/16 0334     Updated:  08/27/16 0337     POCT - Glucose Whole blood 84 mg/dL     Urine culture [[id number]] Collected:  08/25/16 2214    Specimen:  Urine from Urine, Clean Catch Updated:  08/27/16 0056    Narrative:       ORDER#: [id number]                                    ORDERED BY: Fabio Bering  SOURCE: Urine, Clean Catch                           COLLECTED:  08/25/16 22:14  ANTIBIOTICS AT COLL.:                                RECEIVED :  08/26/16 02:51  Culture Urine                              PRELIM      08/27/16 00:56   +  08/27/16   Culture requires further incubation, results to follow  08/27/16   >100,000 CFU/ML Gram negative rod          Glucose Whole Blood - POCT [[id number]]  (Abnormal) Collected:  08/26/16 2301     Updated:  08/26/16 2304     POCT - Glucose Whole blood 131 (H) mg/dL           Medications:     Current Facility-Administered Medications   Medication Dose Route Frequency   . amiodarone  200 mg Oral BID   . amLODIPine  2.5 mg Oral Daily   . aspirin EC  81 mg Oral Daily   . atorvastatin  20 mg Oral QHS   . cefdinir  300 mg Oral Q24H [facility]   . clopidogrel  75 mg Oral Daily   . docusate sodium  100 mg Oral BID   . DULoxetine  60 mg Oral Daily   . heparin (porcine)  5,000 Units Subcutaneous Q12H [facility]   . insulin glargine  8 Units Subcutaneous QHS   .  metoprolol  25 mg Oral Q12H [facility]   . mupirocin   Nasal BID       Assessment/Plan: 80 y.o. female  with dysfunction of mobility/ ADL due to Acute ischemic left MCA stroke   Hyperglycemia: We will increase Lantus to 12 units  Gram-negative urinary tract infection: Infectious disease input appreciated; Omnicef started.  We will add probiotic  Continue comprehensive intensive inpatient rehab program. Medically stable. Continue current management.   Hendricks Limes, DO  PM&R

## 2016-08-27 NOTE — Rehab Progress Note ([facility]) (Signed)
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    OBJECTIVE  Vital Signs:                       Before Activity              After Activity  Vitals  Time                 1305                         1400  Position/Activity    supine                       sitting  BP Systolic          152                          141  BP Diastolic         77                           77  Pulse                80                           90    Interventions:       Therapeutic Activities:  PAtient received supine in bed resting, but waking  easily to spoken name.  Patient practiced moving from supine to sitting EOB with  SBA and from bed to w/c with CGA.  Sit to stand transfer training provided from  w/c to RW with CGA and verbal cuing to place unilateral hand on RW and  contralateral hand on w/c armrest or seated surface.  Gait training provided  times 35 feet times 2 reps with RW with close SBA.  Neuromuscular re-education  was provided to address standing balance with patient standing next to RW and  using it for unilateral UE support while maintaining static standing balance  with SBA-intermittent minA required.  Stepping up onto 2 inch block with left  foot was practiced times 2 reps to facilitate lateral weight shifts with  min-modA required for steadying.  Gait training provided up/down 6 four-inch  steps with bilateral HR with CGA with patient alternating between step-to and  reciprocal  LE gait pattern.  Patient returned to room, assisted with toilet  transfer and pericare and chnage of clothes secondary to pants noted to be wet.  O.T. arriving to provide next hour of P.T.  Patient was left seated in chair in his/her room.  Patient was handed off to next therapist. Chair alarm in place and activated.  Oriented  to call bell and placed within reach.    Education Provided:    Education Provided: Plan of care. Functional transfers. Gait.  Stair/curb/environmental barrier negotiation.       Audience: Patient.       Mode: Explanation.  Demonstration.   Practice.       Response: Applied knowledge.  Verbalized understanding.  Needs practice.  Needs reinforcement.    ASSESSMENT  Patient continues to display posterior LOB when standing without UE support.  With UE support on RW, patient is able to maintain balance while ambulating,  but, without UE support, patient only maintains static balance for 10-20 seconds  at a time.  Continued training and education/practice is needed to enable  patient to use appropriate balance reactions and strategies, not reliant on UE,  to regain balance when she begins to lose it. Despite recognizing posterior LOB,  patient is resistent to leaning/completing weight shift anteriorly due to  feeling like she is falling forward when doing so. Mirror was provided to  attempt to provide patient visual feedback to assist with recognizing best trunk  position, but patient only used mirror to very limited extent.    Functional Measures      LOCOMOTION STAIRS: Stairs Score = 2.  Incidental assistance with lifting or  lowering, contact guard or steadying was provided. Patient performs 75% or more  of effort and requires minimal contact assistance. Patient negotiated  6 stairs.    Patient requires the following assistive device(s):  Handrail(s). Bilateral HR.    COMPREHENSION: Auditory comprehension is the usual mode. Patient does not  comprehend complex/abstract information in their  primary language without  assistance from a helper. Comprehension Score = 5, Supervision. Patient  comprehends basic daily needs or ideas greater than 90% of the time. Patient  requires stand by/rare prompting.  Patient has the following assistive device(s) or limitations: Auditory  processing    EXPRESSION: Vocal expression is the usual mode. Patient does not express  complex/abstract information in their primary language without a helper.  Expression Score = 4, Minimal Prompting. Patient expresses basic daily needs or  ideas 75-90% of the time.  Patient requires minimal/occasional prompting.  Patient has the following assistive device(s) or limitations: Aphasia and/or  apraxia    SOCIAL INTERACTION: Social Interaction Score = 6, Modified Independent.  Patient  is modified independent for social interaction, requiring: Requires additional  time.    PROBLEM SOLVING: Patient does not make appropriate decisions in order to solve  complex problems without assistance from a helper. Problem Solving Score = 4,  Minimal Direction. Patient makes appropriate decisions in order to solve routine  problems 75-90% of the time. Patient requires minimal/occasional direction for  the following behavior(s): Recognizing presence or extent of disability, Self  correcting performance    MEMORY: Memory Score = 5, Supervision.  Patient recognizes and remembers with  prompting only under stressful or unfamiliar conditions, but no more than 10% of  the time, for the following behavior(s): Remembering immediate  information/instructions (after minutes), Remembering delayed  information/instructions (after days)    PLAN  Continued Physical Therapy is recommended.  Recommended Frequency/Duration/Intensity: daily for 60 minute sessions 5-6  days/week for 7-10 days from 08/26/2016.  Activities Contributing Toward Care Plan: transfer training, gait training, NMR  addressing standing balance and LE motor control, patient/family  education,  equipment procurement prn, and d/c planning, all provided within an  interdisciplinary, team approach to care.    3 Hour Rule Minutes: 60 minutes of PT  treatment this session count towards  intensity and duration of therapy requirement. Patient was seen for the full  scheduled time of PT treatment this session.  Therapy Mode Minutes: Individual: 60 minutes.    Signed by: Dorothey Baseman, PT, DPT 08/27/2016 2:00:00 PM

## 2016-08-28 LAB — CBC
Absolute NRBC: 0 10*3/uL
Hematocrit: 34 % — ABNORMAL LOW (ref 37.0–47.0)
Hgb: 11 g/dL — ABNORMAL LOW (ref 12.0–16.0)
MCH: 28.5 pg (ref 28.0–32.0)
MCHC: 32.4 g/dL (ref 32.0–36.0)
MCV: 88.1 fL (ref 80.0–100.0)
MPV: 10.5 fL (ref 9.4–12.3)
Nucleated RBC: 0 /100 WBC (ref 0.0–1.0)
Platelets: 292 10*3/uL (ref 140–400)
RBC: 3.86 10*6/uL — ABNORMAL LOW (ref 4.20–5.40)
RDW: 15 % (ref 12–15)
WBC: 10.78 10*3/uL (ref 3.50–10.80)

## 2016-08-28 LAB — BASIC METABOLIC PANEL
Anion Gap: 12 (ref 5.0–15.0)
BUN: 26 mg/dL — ABNORMAL HIGH (ref 7–19)
CO2: 24 mEq/L (ref 22–29)
Calcium: 8.9 mg/dL (ref 7.9–10.2)
Chloride: 102 mEq/L (ref 100–111)
Creatinine: 1.6 mg/dL — ABNORMAL HIGH (ref 0.6–1.0)
Glucose: 150 mg/dL — ABNORMAL HIGH (ref 70–100)
Potassium: 4.9 mEq/L (ref 3.5–5.1)
Sodium: 138 mEq/L (ref 136–145)

## 2016-08-28 LAB — GLUCOSE WHOLE BLOOD - POCT
Whole Blood Glucose POCT: 144 mg/dL — ABNORMAL HIGH (ref 70–100)
Whole Blood Glucose POCT: 281 mg/dL — ABNORMAL HIGH (ref 70–100)
Whole Blood Glucose POCT: 223 mg/dL — ABNORMAL HIGH (ref 70–100)
Whole Blood Glucose POCT: 299 mg/dL — ABNORMAL HIGH (ref 70–100)

## 2016-08-28 LAB — GFR: EGFR: 31.2

## 2016-08-28 LAB — C-REACTIVE PROTEIN: C-Reactive Protein: 12.1 mg/dL — ABNORMAL HIGH (ref 0.0–0.5)

## 2016-08-28 MED ORDER — NYSTATIN 100000 UNIT/GM EX POWD
Freq: Two times a day (BID) | CUTANEOUS | Status: DC
Start: 2016-08-28 — End: 2016-09-04
  Filled 2016-08-28: qty 15

## 2016-08-28 NOTE — Progress Notes (Signed)
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  Intensity and Duration: an average of 3 hours/5 days per week  Medical Supervision and 24 Hour Rehab Nursing: x  Physical Therapy: x  PT Intensity/Duration: 60-120'daily for 5-6 d/wk  Occupational Therapy: x  OT Intensity/Duration: 60-120'daily for 5-6 d/wk  Speech and Language Therapy: x  SLP Intensity/Duration: 60-120'daily for 5-6 d/wk  Case Management: Arlie Solomons, CM  Nurse: Ludger Nutting, RN  Occupational Therapy: Melinda Crutch, OT  Physical Therapy: Dorothey Baseman, PT  Physician: Hendricks Limes, MD  Speech Language Pathology: Judd Lien, SLP  Occupational Therapy Other: Peggye Form OT for Melinda Crutch OT  Speech Language Pathology Other: Bertram Gala SLP for Judd Lien SLP    The following is a list of patient problems that have been identified by the  interdisciplinary team:    Problem: Impaired Bladder Management  Bladder Management Status Update: has been incont. of bladder; aware of needing  to urinate but unable to hold urine for toileting  Team Identified Barrier to Discharge: Yes  Timed voids/scheduled toileting: Active  Bladder retraining: Active  Bladder Mgmt: Primary Team Goal/Status: Patient will be continent of bladder at  least 75% of the time with timed tolieting / Active    Problem: Impaired Bowel Management  Bowel Management  Status Update: has had an incont. episode of bowel today  Team Identified Barrier to Discharge: Yes  Interventions:  Medications as ordered: Active  Maintain adequate fluid intake by mouth: Active  Reinforce proper diet and fluids: Active  Bowel Mgmt: Primary Team Goal/Status: Patient will be able sustain bowel  continence with timed toileting at least 75% of the time / Active    Problem: Impaired Communication  Communication: Primary Team Goal/Status: Caregiver will demonstrate appropriate  cueing (i.e. probing questions) to address word finding difficutly in order to  facilitate patient's communicaiton of wants/needs in community. /    Problem: Impaired Mobility  Mobility Status Update: Patient moves from sitting to standing and ambulates 30  feet with RW with minA.  Team Identified Barrier to Discharge: Yes  Interventions:  Transfer training: Active  Gait training: Active  Mobility: Primary Team Goal/Status: Patient will transfer from sitting to  standing and ambulate 50 feet with LRAD with mod I, ambulate 150 feet with LRAD  with supervision, and climb 12 steps with unilateral HR with CGA so patient can  maintain modified independence on one level and access all levels of sons'  homes. / Active    Problem: Impaired Nutrition / Hydration  Nutrition: Primary Team Goal/Status: Maintain meal intake of 75% or more through  LOS /    Problem: Impaired Pain Management  Pain Management Status Update: has been without discomfort at this time  Team Identified Barrier to Discharge: No  Interventions:  Medications as ordered: Active  Distraction  and/or relaxation: Active  Positioning: Active  Pain Mgmt: Primary Team Goal/Status: Patient will verbalized pain less than 2  out of 10 with the use of medication and positioning techniques.Marland Kitchen / Active    Problem: Impaired Self-care Mgmt/ADL/IADL  Self Care/ADL/IADL Status Update: Ms. Mickel is Min A bathing, S grooming, Min  A UB dressing, Min A LB dressing, Total A for  incontinence care, Min A  transfers.  Team Identified Barrier to Discharge: Yes  Interventions:  Adaptive equipment training: Active  Compensatory strategies: Active  Encourage patient to participate in activities of daily living: Active  Self Care: Primary Team Goal/Status: Ms. Jankovich will perform basic UB/LB  dressing at MOD I. / Active    Problem: Safety Risk and Restraint  Safety Risk Status Update: cues needed for safety precautions; learning process  Team Identified Barrier to Discharge: Yes  Interventions:  Identify patient at risk for falls: Active  Call light within reach: Active  Orient patient/family/caregiver to room and equipment: Active  1:1 supervision: Active  Stay with patient in bathroom: Active  Bed alarm/wheelchair alarm: Active  Anticipate needs, including personal items within reach: Active  Safety: Primary Team Goal/Status: Patient will recognize limitations and call  for assistance before attempting mobility 100% of the time in order to prevent  fall. / Active    Comments: mobility/ADL dysfunction due to left MCA stroke: PT, OT, speech  therapy.  Secondary stroke prevention: Aspirin, Plavix  Cardiac debility, status post right thoracotomy for myxoma excision, and PFO  closure: We will continue amiodarone, until 09/05/16  Hypertension: Norvasc, metoprolol.  Impaired balance: Balance training  Partial amputation of the left foot    Pain : Tylenol  DVT ppx: Heparin    Signed by: Hendricks Limes, DO 08/28/2016 2:49:00 PM    Physician CoSigned By: Hendricks Limes 08/28/2016 14:49:27

## 2016-08-28 NOTE — Rehab Progress Note ([facility]) (Signed)
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 It is hard to find  words.  Pain: Patient currently without complaints of pain.    OBJECTIVE    Interventions:       Speech Treatment: Confrontation naming for simple pictures: 100% with no  cues.  Responsive naming for simple objects: 19/20 with no cues.  Generative naming for simple, functional categories (household items, etc): 5  items per category in 3/6 categories. Increased with semantic cues.  Barrier task (describing object pictured): 100% when naming object SLP is  describing  with average of 7 per minute. Requires min prompts and demos x2  instances of significant difficulty when she is describing pictures. Across all  turns when she is describing, averages 4 per minute.       Swallowing:  Pt received in bed completing breakfast meal. Intermittent  cough and frequent throat clear noted with sips of coffee and throughout session  with PO (taking pills with water) and without PO. Pt demo immediate cough after  swallowing all of her pills at once with water. She denies increased cough with  PO and report cough and throat clear intermittently since CVA. Demos good  comprehension of swallow precautions of upright, small sips/bites, and  cough/throat clear with reflexive subsequent swallow. Recommend clinical f/u for  swallowing.  Patient was returned to or left in bed. Personal items within reach.  Pain Reassessment: Pain was not reassessed as no pain was  reported.    Education Provided:  No education provided this session.    ASSESSMENT  Patient demos excellent participation and good insight into deficits. Word  retrieval deficits are primarily c/b anomia for responsive naming and  description tasks. Word retrieval at discourse level is good and she is noted to  utilize circumlocution with min-no cues. Recommend clinical monitoring for s/s  aspiration and f/u for diet texture management and compensatory strategy  training d/t cough and throat clear as delineated above.    Functional Measures  Patient was not assessed.    PLAN  Continued Speech Language Pathology is recommended to address:  Recommended Frequency/Duration/Intensity: 60 min/day, 5-6 days/week for 7-10  days from 08/26/2016, 1:1 and group tx as appropriate  Continued Activities Contributing Toward Care Plan: Cognitive-linguistic  assessment and treatment planning, dysphagia assessment, Patient/family  education, Plan of care coordination with care team, Discharge planning    3 Hour Rule Minutes: 60 minutes of SLP treatment this session count towards  intensity and duration of therapy requirement. Patient was seen for the full  scheduled time of SLP treatment this session.  Therapy Mode Minutes: Individual: 60 minutes.    Signed by: Leonarda Salon, M.Ed., CCC/SLP 08/28/2016 9:00:00 AM

## 2016-08-28 NOTE — Rehab Progress Note ([facility]) (Signed)
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  Pain Reassessment: Pain was not reassessed as no pain was reported.  Patient/Caregiver Goals:  "To be able to walk without any device".    NEURO  Orientation/Awareness: Alert and Oriented x3.  Speech: Clear at times.  Behavior: Cooperative.    MEDICATIONS  IV Access: No IV access.  Dialysis Access: Patient does not have dialysis access.      Elopement Risk Level Assessment Tool  Patient Criteria:  CATEGORY 1 CRITERIA:   No category 1 criteria. Elopement Risk:  No      RISK ASSESSMENT FOR FALLS/INJURY    MENTAL STATUS CRITERIA:   10 - Impaired Judgement/impulsive.   MENTAL STATUS TOTAL: 10    AGE CRITERIA:   80 - 62 years old and above  AGE TOTAL: 2    ELIMINATION CRITERIA:   5 - Incontinence.   3 - Toileting with Assistance.   ELIMINATION TOTAL: 8    HISTORY OF FALLS CRITERIA:   2 - Unknown History.  HISTORY OF FALLS TOTAL: 2    MEDICATIONS CRITERIA:   1 High Risk Medication (*see list below)   MEDICATIONS TOTAL: 1    PHYSICAL MOBILITY CRITERIA:   3 - Decreased balance reaction.   1 - Weakness/impaired physical mobility.   PHYSICAL MOBILITY TOTAL: 4    FALLS RISK ASSESSMENT TOTAL: 27    Patient's Fall Risk: TOTAL SCORE >10: High Risk    Falls Interventions: Yellow fall risk marker applied to patient identification  band  Oriented to call bell and placed  within reach.  Personal items within reach.  Hourly toileting/safety checks between 6am and 10pm, then every 2 hours.  Bed alarm in place and activated.  Chair alarm in place and activated.  Initiate fall care plan and outcome.  Clutter removed and clear path to BR.  Assistive devices positioned out of reach.  Reoriented PRN.    NUTRITION  Diet:  Type: Consistent carbohydrate. Regular.  Food Consistency: Regular.  Liquid Consistency: Thin.      CARDIOVASCULAR     Bilateral lower extremities  Nail Bed Color: Pink.   No edema or redness present.  Pulses:   Apical Pulse: Regular. Strong. Rate is 78 .   Patient does not have a pacemaker.   Patient does not have a defibrillator.    CARDIOPULMONARY  Lung Sounds:   Upper lobes. Clear.   Lower lobes. Clear.  Type of Respirations: Regular.  Cough: No cough noted.  Respiratory Support: The patient does not require any respiratory support.  Respiratory Equipment: None. Denies any distress with resp[iration.    INTEGUMENTARY  Skin:  Temperature: Warm  Turgor: Normal for age  Moisture: Dry  Color of skin: Normal for Race/Ethnicity  Capillary Refill: Less than  3 seconds   Bruising: scartered bruises to both extremities  Wounds/Incisions:  Surgical Incision: Rt breast incision Length: 1 centimeter(s)  open No signs of infection.  Drainage: Incision without drainage.  Odor:  No  Incision Care: Per protocol.     Surgical Incision: Rt breast fold incision Length: 11 centimeter(s) with glue.  Erythema present. Pain present.  Drainage: Minimal amount of serosanguineous drainage.  Odor:  No  Incision Care: Clean with NS, pat dried with gauze, nystatin pawder applied over  periwound redenned skin, covered with 4x4 gauze     Surgical Incision: Rt chest incision Length: 1 centimeter(s) OTA  Drainage: Incision without drainage.  Odor:  No  Incision Care: Per protocol.       Rt breast x2 incision length 0.5 each cleaded with NS, pat dried and covered  with gauze and tape     Surgical  Incision: Rt groin incision Length: 5 centimeter(s) with glue. No  signs of infection.  Drainage: Incision without drainage.  Odor:  No  Incision Care: Clean with NS, pat dried with gauze, nystatin pawder applied over  periwound redenned skin, covered with 4x4 gauze       LUE skin tears healing, OTA. Redness on the sacral area resolved  Braden Scale for Predicting Pressure Sore Risk: Sensory Perception: Slightly  limited  Moisture: Occasionally moist  Activity: Chairfast  Mobility: Slightly limited  Nutrition: Adequate  Friction and Shear: Potential problem  Braden Score: 16  Level of Risk: At risk (15-18)   Incontinent Protocol   Turn patient every 2 hours   Assist patient to the bathroom every 2 hours    GASTROINTESTINAL  Abdomen: Soft. Nontender.  Bowel Sounds:  Active bowel sounds audible in all four quadrants.  Date of Last Bowel Movement:  08/27/16  Current Bowel Pattern:  Incontinent   No Problems/Complaints with Bowel Elimination Assessed.    GENITOURINARY  Current Bladder Pattern: Incontinent  Color:  Amber   Problems/Complaints with Urination: Incontinence    Bowel and Bladder Output:                       Bladder (# only)             Bowel (# only)  Number of Episodes  Continent            0                            0  Incontinent          3                            0    MUSCULOSKELETAL  Upper Body: Right hand swollen and weak  Lower Body: right leg weakness    Functional Measures       Bladder Score = 2. Patient performs 25-49% of tasks and requires maximal  assistance for bladder management. Helper provides most of the assist to apply  AND remove brief.    BLADDER ACCIDENTS THIS SHIFT:  0 . Patient has not had an accident but used an  adult brief this assessment.     Bowel Score = 2. Patient performs 25-49% of tasks and requires maximal  assistance for bowel management. Helper provides most of the assist to apply AND  remove brief.    BOWEL ACCIDENTS THIS SHIFT: 0 . Patient has not  had an accident,  but used a  stool softener.    Education Provided:    Education Provided: Precautions. Pain management. Pain scale. Side effects.  Medication options. Clinical indicators of pain. Plan of care. Safety. Swallow  precautions. Stroke. Types of stroke, Signs /T/ Symptoms associated with stroke,  Stroke prevention, Medication compliance, Healthy diet, Increase physical  activity, Reduce stress, Pressure ulcer, Prevention of contractures/subluxation       Audience: Patient.       Mode: Explanation.       Response: Verbalized understanding.    Discharge: Patient is not being discharged at this time.    Long Term Goals: 1. Patient will be continent with bladder 100% most of the  time.   2. Patient will remain continent of bowel until discharge.   3. Patient will recognize limitations and call for assistance before attempting  mobilty 100% of the time in order to prevent falls.   4. Surgical wound will be clean with no drainage to minimize risk of infection.   5. Patient will verbalized pain level 2/10 after giving medication and  repositioning techniques.  2 weeks after 08/25/2016  Short Term Goals: 1. Patient will call for assistance 75% of the time before  attempting transfer to prevent falls during hospitalization.   2. Patient will verbalize pain  less than 3 out of 10 with or without  medication.   3. Patient identifies correct drug indications, dosage, administration times  and side effects 75% on the medications.  1 week after 08/25/2016    PROGRESS TOWARD GOALS: Patient denied pain or discomfort, call for assistance at  times, required frequent check.    PLAN: Nursing Specific Interventions  Bladder Management. Bowel Management. Medication Management. Skin Management.  Continue with the current Nursing Plan of Care with the following modifications:     Wound Management:    TEAM CARE PLAN  Identified problems from team documentation:  Problem: Impaired Bladder Management  Bladder Mgmt: Primary Team Goal: Patient will  be continent of bladder at least  75% of the time with timed tolieting/Active    Problem: Impaired Bowel Management  Bowel Mgmt: Primary Team Goal: Patient will be able sustain bowel continence  with timed toileting at least 75% of the time/Active    Problem: Impaired Communication  Communication: Primary Team Goal: Caregiver will demonstrate appropriate cueing  (i.e. probing questions) to address word finding difficutly in order to  facilitate patient's communicaiton of wants/needs in community./    Problem: Impaired Mobility  Mobility: Primary Team Goal: Patient will transfer from sitting to standing and  ambulate 50 feet with LRAD with mod I, ambulate 150 feet with LRAD with  supervision, and climb 12 steps with unilateral HR with CGA so patient can  maintain modified independence on one level and access all levels of sons'  homes./Active    Problem: Impaired Nutrition / Hydration  Nutrition: Primary Team Goal: Maintain meal intake of 75% or more through LOS/    Problem: Impaired Pain Management  Pain Mgmt: Primary Team Goal: Patient will verbalized pain less than 2 out of 10  with the use of medication and positioning techniques.Marland KitchenJorje Guild    Problem: Impaired Self-care Mgmt/ADL/IADL  Self Care: Primary Team Goal: Ms. Ingerson will perform basic UB/LB dressing at  MOD I./Active    Problem: Safety Risk and Restraint  Safety: Primary Team Goal: Patient will recognize limitations and call for  assistance before attempting mobility 100% of the time in order to prevent  fall./Active  Add/Update Problems from this assessment:  No updates at this time.    Please review Integrated Patient View Care Plan Flowsheet for Team identified  Problems, Interventions, and Goals.    Signed by: Lowella Dell, RN 08/28/2016 5:13:00 PM

## 2016-08-28 NOTE — Rehab Evaluation ([facility]) (Signed)
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 Found by family in her room  struggling to put on her shirt, not speaking or responding properly. She was  outside window for lytics.  Past Medical History of DM2, HTN, HLD, CAD with CABG, and Recurrent UTIs (s/p  Renal Stent 08/05/16).    Neurological: Acute CVA: with mild right sided deficits, cardioembolic in  etiology    Cardiac workup: Found to have Large RA mass occupying much of the RA and  Possible interatrial shunt  Right Atrial Myxoma found on Echo and confirmed on TEE.  TEE confirms: Large RA mass taking up most or FRA cavity but not obstructing or  prolapsing across the TV orifice. Size: at least 5.5 cm in height and 3.5 cm in  width.  Cardiac Cath was completed and patient cleared for surgery  Now s/p atrial septal mass excision via right thoracotomy and PFO closure on  08/21/16    Cardiovascular:SR 80's EF: 65%, Moderate Aortic Stenosis  CAD s/p 4V-CABG in 2010 [facility] [place])  Left Heart Cath 08/17/16 - 3/4 bypass grafts patent with chronic occlusion of RCA  and right coronary bypass graft - stable anatomy.  HTN: Continue Metoprolol, norvasc added due to episode of hypertention  overnight.  Plavix started per neurology recommendations.  Core  Measures: Aspirin - Y; Beta blocker - Y; ACE - N; Statin - Y    Pulmonary: Weaned to RA  Chest tubes: removed on 08/23/16  Chest X-ray with Minimal bibasilar atelectasis, encourage Incentive Spriometry  use.    Renal/GU: UTI Completed course of IV Ceftriaxone  Obstructive uropathy recent ureteral stenting 07/2016, creat falling but  fluctuating: from 1.3-1.8  Prior renal artery stenosis w stenting  AKI on CKD: resolving, Cr 1.7,  Incontinence yesterday no reports overnight.    Hematological:Hct 10/32, on ASA  DVT prophylaxis: SCDs, Hep SQ    Infectious Disease: Afebrile, WBC:12 down trending.    Endocrinological: DM: A1c 7.7,  Continue Sliding Scale Insulin  Endocrine consult completed    Pain is well controlled. Paravertebral pain pump removed 08/24/16, did not  require pain medication overnight.  Evaluated by therapy with recommendations for acute rehab level of care. Patient  was Mod I for mobiliy and Independent for ADL's. Requires ongoing medical  managment of current condition and comorbid conditons. Medically stable for  transfer to acute rehab 08/25/16.   Date of Onset: 08/08/16   Date of Admission: 08/25/2016 7:16:00 PM    Premorbid Functional Level: Patient's family reported Patient was modified  independent with a RW or SPC for mobility, living with one of her sons (in  either [place] or [place])  Understanding of Current  Condition: Patient alert and oriented and understands  present medical status.  Patient/Caregiver Goals:  Patient's functional goals: "To be able to walk  without any device".  Home Environment: Patient lives in a home environment. Patient lives with adult  son/daughter in law and grandchildren who is (are) able to assist patient at  discharge.  Primary Language: English    Demographics: (Source of HX, Pre-Hosp, Marital, Income, Vocation, [place])  Source of History: Other Family.  Pre-Hospital Living  Environment: Home.  Marital Status: Widowed.  Income Source:   Designer, jewellery.    Vocational Status: Retired for Age.  Family Contact Information:  Primary Contact: Alberteen Sam  Relationship: son  Address: [address] [place], [place]  Primary Number: [phone]  Secondary Number:    Additional Contact: Freddi Starr  Relationship: daughter in law  Address: [place], [place]  Primary Number: [phone]  Secondary Number:  POA/Guardian Information:  Sherman Lipuma  Phone Number: [phone]  Relationship to Patient: Son: Power of [place].  Care Act Designee: Alberteen Sam  Address: [place], [place]  Phone number: [phone]  Military Status: The patient has never been in the [employer].  Financial Concerns: none reported    Special Needs: None.  Observed Behaviors:  Cooperative.  Psychosocial History:   None.  Adjustment to Present Illness:  Patient is coping adequately.   Patient is accepting limitations adequately.   Patient's expectations are realistic.   Patient is motivated.  Patient Perceived Primary Stressors:  Providers: Clarene Duke, MD - cardiac surgery  Boneta Lucks, NP - NEW primary care in [place], [place]    Home Care/Long Term Care Policy: None.    Interdisciplinary Educational Needs and Learning Preferences:  Education not  assessed/provided this session.    Rehab Potential: Able to participate in an intensive inpatient interdisciplinary  rehabilitation program, Good family/social support, Good premorbid functional  status, Living in the community premorbidly, Motivated  Barriers to Progress/Discharge: No potential barriers to progress.    Case Management Psychosocial Assessment: CM met with patient/family to introduce  self/role. Patient's daughter in law confirms assessment information as  indicated above. CM discussed acute rehabilitaiton and discharge planning  process. Reviewed patient care providers. Offered family meeting, scheduled.  ELOS 10/13. CM will continue to follow for discharge needs.  Family Meeting: Family meeting was offered and has been scheduled. Meeting  date  and time: Tuesday, October 11 at [employer] Resources: TBD    Medicare Important Message: The Medicare Important Message letter was issued.    Care Plan  Identified problems from team documentation:  Problem: Impaired Bladder Management  Bladder Mgmt: Primary Team Goal: Patient will be continent of bladder at least  75% of the time with timed tolieting/Active    Problem: Impaired Bowel Management  Bowel Mgmt: Primary Team Goal: Patient will be able sustain bowel continence  with timed toileting at least 75% of the time/Active    Problem: Impaired Communication  Communication: Primary Team Goal: Caregiver will demonstrate appropriate cueing  (i.e. probing questions) to address word finding difficutly in order to  facilitate patient's communicaiton of wants/needs in community./    Problem: Impaired Mobility  Mobility: Primary Team Goal: Patient will transfer from sitting to standing and  ambulate 50 feet with LRAD with mod I, ambulate 150 feet with LRAD with  supervision, and climb 12 steps with unilateral HR with CGA so patient can  maintain modified independence on one level and access all levels of sons'  homes./Active    Problem: Impaired Nutrition /  Hydration  Nutrition: Primary Team Goal: Maintain meal intake of 75% or more through LOS/    Problem: Impaired Pain Management  Pain Mgmt: Primary Team Goal: Patient will verbalized pain less than 2 out of 10  with the use of medication and positioning techniques.Marland KitchenJorje Guild    Problem: Impaired Self-care Mgmt/ADL/IADL  Self Care: Primary Team Goal: Ms. Rena will perform basic UB/LB dressing at  MOD I./Active    Problem: Safety Risk and Restraint  Safety: Primary Team Goal: Patient will recognize limitations and call for  assistance before attempting mobility 100% of the time in order to prevent  fall./Active    Identified problems from this assessment:     No problems identified at this time.    Please review Integrated Patient View Care Plan Flowsheet for  Team identified  Problems, Interventions, and Goals.    Signed by: Arlie Solomons, MSW 08/28/2016 4:17:00 PM

## 2016-08-28 NOTE — Rehab Progress Note ([facility]) (Signed)
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  Location: general discomfort  Type: Chronic  Quality: Aching.  Pain Scale: Numeric.  Patient reports a pain level of 4 out of 10.  Patient's acceptable level of pain 0 out of 10.   Interferes with sleep. physical activity.  Pain is alleviated by: pain med  Pain is exacerbated by: activity Patient medicated.  Pain Reassessment:  Response to Pain Intervention: pt sleep  Post Intervention Pain Quality:  None.  Patient Reports Post Intervention Pain Level of: 0 out of 10  Pain Acceptable: Yes  Patient/Caregiver Goals:  "To be able to walk without any device".    NEURO  Orientation/Awareness: Alert and Oriented x3.  Speech: No deficits noted at this time.  Behavior: Cooperative.    MEDICATIONS  IV Access: No IV access.  Dialysis Access: Patient does not have dialysis access.      Elopement Risk Level Assessment Tool  Patient Criteria: Patient is not capable of leaving the unit.  Assessment is not  applicable.      RISK ASSESSMENT FOR FALLS/INJURY    MENTAL STATUS CRITERIA:   0 - None identified.  MENTAL STATUS TOTAL: 0    AGE CRITERIA:   80 - 34 years old and above  AGE TOTAL: 2    ELIMINATION CRITERIA:   3 - Toileting with Assistance.   ELIMINATION TOTAL: 3    HISTORY OF FALLS CRITERIA:   2 - Unknown History.  HISTORY OF FALLS TOTAL: 2    MEDICATIONS CRITERIA:   2 or more High Risk  Medications (*see list below)   MEDICATIONS TOTAL: 2    PHYSICAL MOBILITY CRITERIA:   3 - Decreased balance reaction.   1 - Weakness/impaired physical mobility.   PHYSICAL MOBILITY TOTAL: 4    FALLS RISK ASSESSMENT TOTAL: 13    Patient's Fall Risk: TOTAL SCORE >10: High Risk    Falls Interventions: Yellow fall risk marker applied to patient identification  band  Oriented to call bell and placed within reach.  Personal items within reach.  Hourly toileting/safety checks between 6am and 10pm, then every 2 hours.  Bed alarm in place and activated.  Chair alarm in place and activated.  Initiate fall care plan and outcome.  Clutter removed and clear path to BR.  Assistive devices positioned out of reach.  Reoriented PRN.    NUTRITION  Diet:  Type: Consistent carbohydrate.  Food Consistency: Regular.  Liquid Consistency: Thin.      CARDIOVASCULAR     Bilateral lower extremities  Nail Bed Color: Pink.   No edema or redness present.  Pulses:   Right Radial Pulse: Regular. Strong. Rate is 78 .   Patient does not have a pacemaker.   Patient does not have a defibrillator.  CARDIOPULMONARY  Lung Sounds:   Upper lobes. Clear.   Lower lobes. Clear.  Type of Respirations: Regular.  Cough: No cough noted.  Respiratory Support: The patient does not require any respiratory support.  Respiratory Equipment: None. 96% RA    INTEGUMENTARY  Skin:  Temperature: Warm  Turgor: Normal for age  Moisture: Dry  Color of skin: Normal for Race/Ethnicity  Capillary Refill: Less than 3 seconds   Bruising: bruising bil UE  Wounds/Incisions: No wounds or incisions.  Braden Scale for Predicting Pressure Sore Risk: Sensory Perception: Slightly  limited  Moisture: Occasionally moist  Activity: Chairfast  Mobility: Slightly limited  Nutrition: Adequate  Friction and Shear: Potential problem  Braden Score: 16  Level of Risk: At risk (15-18)   Pressure Relief every 30 minutes while in wheelchair   Turn patient every 2 hours   Assist patient to the  bathroom every 2 hours   Incontinent Protocol    GASTROINTESTINAL  Abdomen: Soft. Nontender.  Bowel Sounds:  Active bowel sounds audible in all four quadrants.  Date of Last Bowel Movement:  08/27/16  Current Bowel Pattern:  Continent   No Problems/Complaints with Bowel Elimination Assessed.    GENITOURINARY  Current Bladder Pattern: Incontinent Stress incontinence only.  Color:  Yellow   Patient denies problems with urination and/or catheter.    Bowel and Bladder Output:                       Bladder (# only)             Bowel (# only)  Number of Episodes  Continent            0                            0  Incontinent          2                            0    MUSCULOSKELETAL  Upper Body: Right hand swollen and weak  Lower Body: right leg weakness    Functional Measures       Bladder Score = 2. Patient performs 25-49% of tasks and requires maximal  assistance for bladder management. Helper provides most of the assist to apply  AND remove brief.    BLADDER ACCIDENTS THIS SHIFT:  0 . Patient has not had an accident but used an  adult brief this assessment.     Bowel Score = 3. Patient performs 50-74% of tasks and requires moderate  assistance for bowel management. Helper provides assist to position the bucket  and holds it in place less than 50% of the time.    BOWEL ACCIDENTS THIS SHIFT: 0 . Patient has not had an accident, but used a  bucket of bedside commode.    COMPREHENSION: Auditory comprehension is the usual mode. Comprehension Score =  6, Modified Independence.  Patient comprehends complex/abstract information in  their primary language, requiring: Additional time.    EXPRESSION: Vocal expression is the usual mode. Expression Score = 6, Modified  Independent.  Patient expresses complex/abstract information in their primary  language, requiring: Additional time.    SOCIAL INTERACTION: Social Interaction Score = 7, Independent. Patient is  completely independent for social interaction.  There are no  activity  limitations.    PROBLEM SOLVING: Problem Solving Score =  6, Modified Independence.  Patient  makes appropriate decisions in order to solve complex problems, but requires  extra time.    MEMORY: Memory Score = 6, Modified Independence.  Patient is modified  independent for memory, requiring: Requires additional time.    Education Provided:    Education Provided: Pain management. Pain scale. Medication options. Side  effects. Safety. Swallow precautions.       Audience: Patient.       Mode: Explanation.       Response: Verbalized understanding.    Discharge: Patient is not being discharged at this time.    Long Term Goals: 1. Patient will be continent with bladder 100% most of the  time.   2. Patient will remain continent of bowel until discharge.   3. Patient will recognize limitations and call for assistance before attempting  mobilty 100% of the time in order to prevent falls.   4. Surgical wound will be clean with no drainage to minimize risk of infection.   5. Patient will verbalized pain level 2/10 after giving medication and  repositioning techniques.  2 weeks after 08/25/2016  Short Term Goals: 1. Patient will call for assistance 75% of the time before  attempting transfer to prevent falls during hospitalization.   2. Patient will verbalize pain  less than 3 out of 10 with or without  medication.   3. Patient identifies correct drug indications, dosage, administration times  and side effects 75% on the medications.  1 week after 08/25/2016    PROGRESS TOWARD GOALS: SHORT TERM GOAL REVIEW:       1. Patient will call for assistance 75% of the time before attempting  transfer to prevent falls during hospitalization. - Met       New Goal: Patient will call for assistance 75% of the time before  attempting transfer to prevent falls during hospitalization.  Time frame to achieve short term goal(s):  1 week after 08/25/2016   LONG TERM GOAL REVIEW:       New Goal 1. Patient will be continent with bladder  100% most of the time.  Timeframe to achieve long term goal(s):  2 weeks after 08/25/2016    PLAN: Nursing Specific Interventions  Bladder Management. Bowel Management. Medication Management. Skin Management.  Continue with the current Nursing Plan of Care.    TEAM CARE PLAN  Identified problems from team documentation:  Problem: Impaired Bladder Management  Bladder Mgmt: Primary Team Goal: Patient will be continent of bladder at least  75% of the time with timed tolieting/Active    Problem: Impaired Bowel Management  Bowel Mgmt: Primary Team Goal: Patient will be able sustain bowel continence  with timed toileting at least 75% of the time/Active    Problem: Impaired Communication  Communication: Primary Team Goal: Caregiver will demonstrate appropriate cueing  (i.e. probing questions) to address word finding difficutly in order to  facilitate patient's communicaiton of wants/needs in community./    Problem: Impaired Mobility  Mobility: Primary Team Goal: Patient will transfer from sitting to standing and  ambulate 50 feet with LRAD with mod I, ambulate 150 feet with LRAD with  supervision, and climb 12 steps with unilateral HR with CGA so patient can  maintain modified independence on one level and access all levels of sons'  homes./Active    Problem: Impaired Nutrition / Hydration  Nutrition: Primary Team Goal: Maintain meal intake of 75% or more through LOS/    Problem: Impaired Pain Management  Pain Mgmt: Primary Team  Goal: Patient will verbalized pain less than 2 out of 10  with the use of medication and positioning techniques.Marland KitchenJorje Guild    Problem: Impaired Self-care Mgmt/ADL/IADL  Self Care: Primary Team Goal: Ms. Callan will perform basic UB/LB dressing at  MOD I./Active    Problem: Safety Risk and Restraint  Safety: Primary Team Goal: Patient will recognize limitations and call for  assistance before attempting mobility 100% of the time in order to prevent  fall./Active    Add/Update Problems from this  assessment:  No updates at this time.    Please review Integrated Patient View Care Plan Flowsheet for Team identified  Problems, Interventions, and Goals.    Signed by: Zorita Pang, RN 08/27/2016 11:10:00 PM

## 2016-08-28 NOTE — Rehab Progress Note ([facility]) (Signed)
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  Food Consistency: None  Liquid Consistency:None    Plan Of Care  Anticipated Discharge Date/Estimated Length of Stay: 10/13  Anticipated Discharge Destination: Community discharge with assistance  Discharge Plan : Home with [facility]  Case Management: Arlie Solomons, CM  Nurse: Ludger Nutting, RN  Occupational Therapy: Melinda Crutch, OT  Physical Therapy: Dorothey Baseman, PT  Physician: Hendricks Limes, MD  Speech Language Pathology: Judd Lien, SLP  Occupational Therapy Other: Peggye Form OT for Melinda Crutch OT  Speech Language Pathology Other: Bertram Gala SLP for Judd Lien SLP    The following is a list of patient problems that have been identified by the  interdisciplinary team:    Problem: Impaired Bladder Management  Bladder Management Status Update: has been incont. of bladder; aware of needing  to urinate but unable to hold urine for toileting  Team Identified Barrier to Discharge: Yes  Timed voids/scheduled toileting: Active  Bladder retraining: Active  Bladder Mgmt: Primary Team Goal/Status: Patient will be continent of bladder at  least 75% of the time with timed tolieting / Active    Problem: Impaired Bowel Management  Bowel Management Status Update: has had an incont. episode of bowel  today  Team Identified Barrier to Discharge: Yes  Interventions:  Medications as ordered: Active  Maintain adequate fluid intake by mouth: Active  Reinforce proper diet and fluids: Active  Bowel Mgmt: Primary Team Goal/Status: Patient will be able sustain bowel  continence with timed toileting at least 75% of the time / Active    Problem: Impaired Communication  Communication: Primary Team Goal/Status: Caregiver will demonstrate appropriate  cueing (i.e. probing questions) to address word finding difficutly in order to  facilitate patient's communicaiton of wants/needs in community. /    Problem: Impaired Mobility  Mobility Status Update: Patient moves from sitting to standing and ambulates 30  feet with RW with minA.  Team Identified Barrier to Discharge: Yes  Interventions:  Transfer training: Active  Gait training: Active  Mobility: Primary Team Goal/Status: Patient will transfer from sitting to  standing and ambulate 50 feet with LRAD with mod I, ambulate 150 feet with LRAD  with supervision, and climb 12 steps with unilateral HR with CGA so patient can  maintain modified independence on one level and access all levels of sons'  homes. / Active    Problem: Impaired Nutrition / Hydration  Nutrition: Primary Team Goal/Status: Maintain meal intake of 75% or more through  LOS /    Problem: Impaired Pain Management  Pain Management Status Update: has been without discomfort at this time  Team Identified Barrier to Discharge: No  Interventions:  Medications as ordered: Active  Distraction  and/or relaxation: Active  Positioning: Active  Pain Mgmt: Primary Team Goal/Status: Patient will verbalized pain less than 2  out of 10 with the use of medication and positioning techniques.Marland Kitchen / Active    Problem: Impaired Self-care Mgmt/ADL/IADL  Self Care/ADL/IADL Status Update: Glenda Green is S bathing, S UB dressing, Mod A  LB dressing, Min A transfers  Team Identified Barrier to Discharge: Yes  Interventions:  Adaptive equipment  training: Active  Compensatory strategies: Active  Encourage patient to participate in activities of daily living: Active  Self Care: Primary Team Goal/Status: Glenda Green will perform basic UB/LB  dressing at MOD I. / Active    Problem: Safety Risk and Restraint  Safety Risk Status Update: cues needed for safety precautions; learning process  Team Identified Barrier to Discharge: Yes  Interventions:  Identify patient at risk for falls: Active  Call light within reach: Active  Orient patient/family/caregiver to room and equipment: Active  1:1 supervision: Active  Stay with patient in bathroom: Active  Bed alarm/wheelchair alarm: Active  Anticipate needs, including personal items within reach: Active  Safety: Primary Team Goal/Status: Patient will recognize limitations and call  for assistance before attempting mobility 100% of the time in order to prevent  fall. / Active    Fall Risk Assessment: TOTAL SCORE >10: High Risk    Functional Measures  Eating: 5  Grooming: 5  Bathing: 2  Upper Body Dressing: 4  Lower Body Dressing: 1  Toileting: 1  Bladder Level of Assistance: 1  Bowel Level of Assistance: 6  Bed/Chair/Wheelchair Transfer: 1  Toilet Transfer: 1  Tub Transfer:  Engineer, structural: 4  Wheelchair: 0  Walk: 1  Stairs:  Comprehension: 5  Expression: 4  Social Interaction: 5  Problem Solving: 4  Memory: 4  Bladder Frequency of Accidents - Admission Period: Assessment in Progress  Bowel Frequency of Accidents - Admission Period: Assessment in Progress    KEY TO FIM LEVELS (General)  7 The patient completes the task or skill independently in a timely manner.  6 The patient completes the task or skill without a helper but uses an assistive  device, has mild difficulty or needs extra time.  5 The patient performs 100% of the task with supervision.  4 The patient performs 75-90% of the effort to complete the task or skill.  3 The patient performs 50-74% of the effort to complete the task or skill.  2 The patient  performs 25-49% of the effort to complete the task or skill.  1 The patient performs 0-24% of the effort to complete the task or skill.  0 The task or skill does not occur because it is unsafe, contraindicated or the  patient refused.    Walk/Wheelchair Functional Measures scoring parameters:  7 Patient walks/propels a minimum of 150 feet independently.  6 Patient walks/propels a minimum of 150 feet without a helper but with a device  or safety concerns.  5 Patient walks/propels 50 feet independently OR walks a minimum of 150 feet  with supervision.  4 Patient performs 75-90% of the effort to walk/propel a minimum of 150 feet.  3 Patient performs 50-74% of the effort to walk/propel a minimum of 150 feet.  2 Patient walks/propels a minimum of 50 feet with any amount of assistance from  a helper.  1 Patient walks/propels less than 50 feet with any amount of assistance or needs  two helpers for any distance.    Stair Functional Measures scoring parameters:  7 Patient  goes up and down 12 to 14 stairs independently.  6 Patient goes up and down 12 to 14 stairs with a device but without a helper.  5 Patient goes up and down 4 to 6 stairs independently OR 12 to 14 stairs with  supervision.  4 Patient performs 75-90% of the effort to go up and down 12 to 14 stairs.  3 Patient performs 50-74% of the effort to go up and down 12 to 14 stairs.  2 Patient goes up and down 4 to 6 stairs with any amount of assistance from a  helper.  1 Patient goes up and down less than 4 to 6 stairs, needs two helpers for any  number of stairs or uses a stair lift.    Comments: none    Signed by: Arlie Solomons, MSW 08/26/2016 4:49:00 PM    Physician CoSigned By: Hendricks Limes 08/28/2016 14:43:42

## 2016-08-28 NOTE — Consults (Signed)
Service Date: 08/27/2016     Patient Type: I     CONSULTING PHYSICIAN: Johnney Killian MD     REFERRING PHYSICIAN:      ATTENDING PHYSICIAN AND REFERRING PHYSICIAN:  Earl Many, DO.      REASON FOR CONSULTATION:  Dr. Timmie Foerster requests we evaluate this patient with possible urinary tract  infection.     HISTORY OF PRESENT ILLNESS:  This is a 80 year old female with a history of diabetes and obstructive  uropathy that was relieved by stenting on September 13, who had a negative  urine culture at the time, but was on nitrofurantoin.  She was treated with  ceftriaxone thereafter and it was stopped.  She was admitted to the  rehabilitation service on October 3.  She is incontinent of urine and has  no symptomatic complaints in the GU tract.  There has been no nausea,  vomiting, or diarrhea.  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  She has no complaints of fever,  abdominal or flank pain.     CURRENT ANTIBIOTICS:  None.     ALLERGIES:  TAPE.     PAST MEDICAL HISTORY:  Significant for diabetes, hypertension, chronic kidney disease, previous  multiple urinary tract infections, depression, coronary artery disease,  arthritis.     PAST SURGICAL HISTORY:  Includes cardiac surgery noted above.  She has had a joint replacement on  the left knee, some amputations of her toes, the stent placement, bypass in  2010 coronary arteries.     REVIEW OF SYSTEMS:  There is no other obvious pertinent positive finding in review of  psychiatric, neurologic, endocrine, lymphatic, cardiac, pulmonary, GI, GU,  musculoskeletal, dermatologic systems.     FAMILY HISTORY:  Noncontributory in this case.     SOCIAL HISTORY:  There is no abuse of alcohol, tobacco, or IV drugs noted.     PHYSICAL EXAMINATION:  VITAL SIGNS:  She is afebrile and has been.  Other vital signs show blood  pressure 152/77, pulse of 80,  respirations are 18 and unlabored.  SKIN:  Shows no embolic signs.  HEENT:  The conjunctivae are pale without petechiae.  The oropharynx showed  no lesions.  NECK:  Supple without adenopathy.  There is no other adenopathy palpated.  LUNGS:  Clear bilaterally.  HEART:  Showed a regular rate and rhythm with a 1/6 systolic murmur.  ABDOMEN:  Showed no organomegaly, suprapubic fullness, or tenderness.   There was no CVA tenderness.  EXTREMITIES:  Symmetrical.  NEUROLOGIC:  Cranial nerves were grossly intact.     LABORATORY AND DIAGNOSTIC DATA:  Shows white count of 13.2, hemoglobin of 11, and a platelet count of 260.   Her creatinine is 1.7.  Urinalysis had too numerous to count red cells, too  numerous to count white cells, and cultures are pending at present.     IMPRESSION:  A 80 year old female with diabetes, obstructive uropathy, relieved by stent  placement on August 05, 2016, with a negative urine culture at that  time.  I am still somewhat concerned about the possibility that the stent  is colonized and certainly with an ongoing infection it is now, had it not  been before.  She is afebrile, nontoxic, with a minimal leukocytosis, and  she has seen a lot of antibiotics apparently over the past year, as per my  discussion with the daughter.     RECOMMENDATION:  We will start on Omnicef and avoid intravenous unless she becomes more  toxic.  She is likely to have a more resistant pathogen in her urine, but  again unless she becomes toxic I would like to avoid intravenous, as she is  a difficult stick.  I discussed the above management with the daughter,  answered the daughter's questions as well as the patient.           D:  08/28/2016 10:45 AM by Dr. Raphael Gibney. Rebecca Eaton, MD [phone])  T:  08/28/2016 11:08 AM by       Everlean Cherry: [id number]) (Doc ID: [id number])

## 2016-08-28 NOTE — Progress Notes (Signed)
[facility] Physiat[facility] Attending Face to Face Progress Note    Date Time: 08/28/16 3:15 PM  Patient Name: Glenda Green, Glenda Green    Admission date:  08/25/2016    Functional Status/Update:     [x] I I reviewed patient's therapy notes to assess functional status and ongoing need for therapies.   Of note:   Patient received CGA to move from bed to w/c, with P.T. then asking  patient about toileting and patient stating "S" above.  PAtient received sit to  stand transfer training from w/c to RW with CGA and gait training times 70 feet  times 2 reps and 40 feet with RW with CGA. During first rep of gait training,  strong urine smell was detected by P.T. so toilet transfer was completed with RW  and grab bar with CGA.    Subjective and interval development: Patient complains of fatigue and impaired balance, denies headache or dizziness  Review of Systems:   Green comprehensive review of systems was: No fevers, chills, nausea, vomiting, chest pain, shortness of breath, cough, headache, double vision    Objective:   Vitals:  Vitals:    08/27/16 1640 08/28/16 0519 08/28/16 1109 08/28/16 1202   BP: 145/76 155/68 136/62 124/66   Pulse: 80 70 65 66   Resp: 16 18     Temp: 98.4 F (36.9 C) 98.6 F (37 C)     TempSrc: Oral Oral     SpO2: 95% 96%     Weight:       Height:         Physical Examination:  General appearance: Appears well.  In no acute distress. Normal body habitus.  ZO:XWRUE rate and rhythm are regular. No significant lower limb edema.  Pulm:No wheezes, rales, or rhonchi. Decreased breath sounds at both bases.  AVW:UJWJ. Non-tender. Normoactive bowel sounds.  Skin: Right lateral thoracic Incision, minimal erythema and slough  Neuro: Alert, oriented to name, location, time and circumstances.  Word finding deficits.  Hearing intact bilaterally to conversational speech.   .Sensation to LT is intact  Musculoskeletal:   ROM of the right shoulder is limited by pain         Manual muscle strength testing:  Muscle group Left Right    Shoulder abductors 5 Invalid due to pain   Elbow flexors 5 3   Elbow extensors 5 4   Finger flexors 5 4   Hip flexors 4 4   Knee flexors 5 4   Knee extensors 5 4   Ankle dorsiflexors 5 5   Ankle plantarflexors 5 5   EHL 5 5     Extremities: Left foot: Amputation of the 1-3 toes    New Labs:   No results for input(s): GLUCOSEWHOLE in the last 24 hours.      Recent Labs  Lab 08/28/16  0435 08/27/16  0546   WBC 10.78 13.19*   Hgb 11.0* 11.0*   Hematocrit 34.0* 33.2*   Platelets 292 260          Recent Labs  Lab 08/28/16  0435 08/27/16  0546 08/26/16  0446 08/25/16  0356 08/24/16  1230 08/23/16  0643  08/22/16  0156   Sodium 138 136 138 135* 133* 137  --  141   Potassium 4.9 4.9 4.5 3.8 4.5 4.2  --  5.2*   Chloride 102 102 102 101 99* 105  --  111   CO2 24 24 27 26 25 24   --  24   BUN 26*  29* 34* 35.0* 36.0* 38.0*  --  25.0*   Creatinine 1.6* 1.7* 1.8* 1.7* 1.8* 1.5*  --  1.4*   Calcium 8.9 8.7 8.9 8.4 8.3 8.9  --  8.8   Albumin  --  2.7* 3.0*  --   --   --  More results in Results Review  --    Protein, Total  --  5.7* 6.1  --   --   --  More results in Results Review  --    Bilirubin, Total  --  0.4 0.4  --   --   --  More results in Results Review  --    Alkaline Phosphatase  --  78 82  --   --   --  More results in Results Review  --    ALT  --  16 17  --   --   --  More results in Results Review  --    AST (SGOT)  --  10 10  --   --   --  More results in Results Review  --    Glucose 150* 215* 105* 135* 216* 105*  --  153*   Magnesium  --   --   --  1.9 1.7 2.0  --  2.6   Phosphorus  --   --   --   --   --  4.0  --  4.2   More results in Results Review = values in this interval not displayed.  Urine analysis: Pyuria  Urine culture: Gram-negative rods more than 100,000 colonies           Rads:   Venous ultrasound, 08/26/16-no deep vein thrombosis in bilateral lower extremities  Rads:   Radiological Procedure reviewed.  Radiology Results (24 Hour)     ** No results found for the last 24 hours. **             Current medications:     Scheduled Meds: PRN Meds:      amiodarone 200 mg Oral BID   amLODIPine 2.5 mg Oral Daily   aspirin EC 81 mg Oral Daily   atorvastatin 20 mg Oral QHS   cefdinir 300 mg Oral Q24H SCH   clopidogrel 75 mg Oral Daily   docusate sodium 100 mg Oral BID   DULoxetine 60 mg Oral Daily   heparin (porcine) 5,000 Units Subcutaneous Q12H SCH   insulin glargine 12 Units Subcutaneous QHS   lactobacillus/streptococcus 1 capsule Oral Daily   metoprolol 25 mg Oral Q12H SCH   mupirocin  Nasal BID   nystatin  Topical BID       Continuous Infusions:      acetaminophen 650 mg Q4H PRN   benzocaine-menthol 1 lozenge Q2H PRN   dextrose 15 g of glucose PRN   And     dextrose 125 mL PRN   And     glucagon (rDNA) 1 mg PRN   hydrALAZINE 25 mg Q6H PRN   insulin regular 1-4 Units QHS PRN   insulin regular 1-8 Units TID AC PRN   ondansetron 4 mg Q6H PRN   polyethylene glycol 17 g QD PRN   senna 17.2 mg QHS PRN   traMADol 50 mg Q6H PRN         Assessment:   80 y.o. female with  dysfunction of mobility/ ADL due to  left MCA stroke.  Cardiac debility.  Impaired balance  Partial amputation of the left foot   Urinary tract  infection  Plan:  REHAB: Continue comprehensive and intensive inpatient rehab program, including  Physical therapy 60-120 min daily, 5-6 times per week   Occupational therapy 60-120 min daily, 5-6 times per week   Speech therapy 60-120 min daily, 5-6 times per week   Case management   Rehabilitation nursing    Will continue to address the following impairments and issues:   mobility/ADL dysfunction due to left MCA stroke: PT, OT, speech therapy.  Secondary stroke prevention: Aspirin, Plavix  Cardiac debility, status post right thoracotomy for myxoma excision, and PFO closure: We will continue amiodarone until 09/05/16; daily dressing change  Gram-negative urinary tract infection: Oral antibiotics and probiotic; discussed with Dr. Rebecca Eaton, infectious disease; the patient will probably need stent to be  replaced      Hypertension: Controlled, systolic blood pressure-120s-130s; continue Norvasc, metoprolol.  Impaired balance: Balance training  Partial amputation of the left foot  Pain : Tylenol  DVT ppx: Heparin    Coordination of care:  Care coordinated with  Rehab nurse and patient's family    Individualized Overall Plan of Care for Rehabilitation was filled in in [facility].    Discharge planning: Home, 09/04/16    Signed by: Earl Many DO     [facility]

## 2016-08-28 NOTE — Rehab Progress Note ([facility]) (Signed)
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    Pain: Patient without c/o pain.    OBJECTIVE  Vital Signs:                       Before Activity              After Activity  Vitals  Time                 1105                         1200  Position/Activity    supine                       sitting  BP Systolic          136                          124  BP Diastolic         62                           66  Pulse                65                           66    Interventions:       Therapeutic Activities:  Patient received supine in bed, but willing to  participate with P.T. Patient's family is present and attending P.T. session to  observe.  Patient received CGA to move from bed to w/c, with P.T. then asking  patient about toileting and patient stating "S" above.  PAtient received sit to  stand transfer training from w/c to RW with CGA and gait training times 70 feet  times 2 reps and 40 feet with RW with CGA. During first rep of gait training,  strong urine smell was detected by P.T. so toilet transfer was completed with RW  and grab bar with CGA. Patient's brief was soiled, so patient assisted  dependently with changing brief before treatment continued.  Dynamic standing  balance training provided with patient practicing lateral stepping to each side  while holding onto RW with CGA, with patient reporting too much fear to attempt  this activity without UE support of RW.  Patient returned to room,  remaining  sitting in w/c with family present.  Patient was left seated in chair in his/her room.  Patient was left seated in chair with family/friends present. Handoff to  nurse/nursing Cytogeneticist completed.  Oriented to call bell and placed within reach.  Personal items within reach.  Assistive devices positioned out of reach.  Education Provided:    Education Provided: Plan of care. Functional transfers. Gait.       Audience: Patient and Caregiver       Mode: Explanation.  Demonstration.   Practice.       Response: Applied knowledge.  Verbalized understanding.  Needs practice.  Needs reinforcement.    ASSESSMENT  Patient displays decreased step length with right LE in comparison to left LE,  contributed to by leg length discrepency and decreased achievement of full knee  extension with right LE. Patient is able to partially correct this with repeated  cuing during gait training, which improves her symmetry of gait as well as  balance while ambulating.  Patient would benefit from trial of gait training  with rollator due to likely use of rollator on lower level of her son's home  where carpet is present and patient has had trouble using standard RW in the  past. Patient also needs continued NMR addressing standing balance as patient  has tendency to lose balance posteriorly.    PLAN  Continued Physical Therapy is recommended.  Recommended Frequency/Duration/Intensity: daily for 60 minute sessions 5-6  days/week until 09/04/2016.  Activities Contributing Toward Care Plan: transfer training, gait training, NMR  addressing standing balance and LE motor control, patient/family education,  equipment procurement prn, and d/c planning, all provided within an  interdisciplinary, team approach to care.    3 Hour Rule Minutes: 60 minutes of PT treatment this session count towards  intensity and duration of therapy requirement. Patient was seen for the full  scheduled time of PT treatment this session.  Therapy  Mode Minutes: Individual: 60 minutes.    Signed by: Dorothey Baseman, PT, DPT 08/28/2016 12:00:00 PM

## 2016-08-28 NOTE — Progress Notes (Signed)
Infectious Diseases  Progress Note     Impression:   80 year old female with diabetes, obstructive uropathy, relieved by stent placement on August 05, 2016, with a negative urine culture at that time.  I am concerned about the possibility that the stent will be colonized with whatever grows from her urine culture on this admission. She remains afebrile, nontoxic, and she has a normal white count. She has seen a lot of antibiotics apparently over the past year, as per my discussion with the daughter, and so it is likely that she will have a resistant organism.  Two Gram-negative rod seem to be growing.  She is a difficult stick and would like to avoid IVs if at all possible.  She has had a mild queasiness that might be due to the [place]    Recommendations:   Continue Omnicef for now.  Follow-up urine culture and sensitivity  Should she decompensate, we will have to start an IV and probably Merrem    Subjective:   Mild nausea, no abdominal or CVA pain    Review of Systems  Patient without vomiting, diarrhea, rash, cough, shortness of breath, abdominal or chest pain.      Objective:   BP 124/66   Pulse 66   Temp 98.6 F (37 C) (Oral)   Resp 18   Ht 1.626 m (5\' 4" )   Wt 74.6 kg (164 lb 7.4 oz)   SpO2 96%   BMI 28.23 kg/m     Temp (24hrs), Avg:98.5 F (36.9 C), Min:98.4 F (36.9 C), Max:98.6 F (37 C)      General: awake, alert, oriented x 3; no acute distress.  Cardiovascular: regular rate and rhythm, 1/6 murmur best heard at the base, rubs or gallops  Lungs: clear to auscultation bilaterally, without wheezing, rhonchi, or rales  Abdomen: soft, non-tender, non-distended; no palpable masses, no hepatosplenomegaly, normoactive bowel sounds, no rebound or guarding  Extremities: no clubbing, cyanosis, or edema  Other: No CVA tenderness        Labs:    Recent Labs  Lab 08/28/16  0435 08/27/16  0546 08/26/16  0446   WBC 10.78 13.19* 10.74   Hgb 11.0* 11.0* 11.2*   Hematocrit 34.0* 33.2* 34.9*   Platelets 292  260 246       Recent Labs  Lab 08/28/16  0435 08/27/16  0546 08/26/16  0446  08/22/16  0157   Sodium 138 136 138 More results in Results Review  --    Potassium 4.9 4.9 4.5 More results in Results Review  --    Chloride 102 102 102 More results in Results Review  --    CO2 24 24 27  More results in Results Review  --    BUN 26* 29* 34* More results in Results Review  --    Creatinine 1.6* 1.7* 1.8* More results in Results Review  --    Calcium 8.9 8.7 8.9 More results in Results Review  --    Albumin  --  2.7* 3.0*  --  3.8   Protein, Total  --  5.7* 6.1  --  5.8*   Bilirubin, Total  --  0.4 0.4  --  0.5   Alkaline Phosphatase  --  78 82  --  60   ALT  --  16 17  --  32   AST (SGOT)  --  10 10  --  46*   Glucose 150* 215* 105* More results in Results Review  --  More results in Results Review = values in this interval not displayed.

## 2016-08-28 NOTE — Rehab Progress Note ([facility]) (Signed)
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    OBJECTIVE  Vital Signs:                       Before Activity              After Activity  Vitals  Time                 0915                         0955  Position/Activity    supine                       supine  BP Systolic          162                          152  BP Diastolic         74                           78  Pulse                65                           70    Activities of Daily Living  Feeding: Not assessed.  Grooming: Supervision. Patient requires setup, handling supplies, and/or cueing.  The patient used the following equipment: None  Bathing: Minimal assistance. Patient needs steadying assist or physical  assistance to wash/rinse/dry 2 out of 10 areas. The patient used the following  equipment: hand held shower, grab bars steadying assist  Upper Body Dressing: Minimal Assistance. Patient requires steadying balance or  physical assistance for 1/4 parts or 2/8 parts, or is dependent for closures.  The patient used the following equipment: None  Lower Body Dressing: Minimal Assistance. Patient requires steadying balance or  physical assistance for 1-2/10 parts, or incidental assist with each garment, or  is dependent for closures. The patient used the following equipment: None  Toileting: Maximal assistance. Patient needs help for 2 of 3 tasks such as pants  up and cleansing. The patient used the following equipment: grab  bar  Toilet Transfer: Minimal assistance. Patient needs steadying assistance or less  than 25% physical lifting. The patient used the following equipment: grab bars  Tub/ShowerTransfers: Patient transferred to shower. Minimal assistance. Patient  needs steadying assistance or less than 25% physical lifting. The patient used  the following equipment: shower chair, grab bars    Functional Measures (other than ADL tasks)  Patient was not assessed.    Interventions:       Self Care/Home Management:  Pt seen for  therapeutic engagement in self care  routine. Please refer to above for details on current LOF. Pt attempted  toileting was incontinent of both bowel and bladder in diaper and as she sat on  toilet. Pt was then able to continue to void. Transitioned to shower. Pt with  flat affect and minimal social interaction today, which is a change from IE day.  Will f/u with neuro Psych. Pt returned to bed per request to rest.  Patient was returned to or left in bed. Bed alarm in place and activated.  Oriented to call bell and placed within reach.  Personal items within reach.  Assistive devices positioned out of reach.  Pain Reassessment: Pain was not reassessed as no pain was reported.    Education Provided:    Education Provided: Activities of daily living. Functional transfers. Safety.       Audience: Patient.       Mode: Explanation.  Demonstration.       Response: Verbalized understanding.  Demonstrated skill.  Needs practice.    ASSESSMENT  Pt is most limited by fatigue and poor activity tolerance. She also lacks  motivation, which she will report herself. She did improve independence with  self care today, but remains limited by the above, and continuous incontinence.  Continue with POC and initiation of timed toileting to minimize incontinent  episodes.    Long Term Goals: Glenda Green will improve standing balance and perform LB  dressing with Min A.  Glenda Green will improve fxl endurance and perform full  shower/dressing routine  with no more than Supervision.  Glenda Green will improve safety and verbalize 3 controllable risk factors to  prevent secondary CVA.  Glenda Green will complete caregiver training and demonstrate  competence in safely assisting/supervising pt prior to d/c.  7-10 days from IE on 08/26/16  Short Term Goals:    Progress Towards Goals: Pt has decreasing motivation to participate and is  fatigued often, impacting her engagement in tx.    Updated FIM Goals: No updates to the FIM goals required.    PLAN  Continued Occupational Therapy is recommended.  Recommended Frequency/Duration/Intensity: 60-120 mins/day, 5-6 days/wk, for 7-10  days from IE on 08/26/16  Continued Activities Contributing Toward Care Plan: ADL training, safety and  mobility training, AE education, DME education, dynamic balance training, CVA  education, caregiver training prn, d/c planning, plan of care  The patient is not appropriate for treatment in group format.    Care Plan  Identified problems from team documentation:  Problem: Impaired Bladder Management  Bladder Mgmt: Primary Team Goal: Patient will be continent of bladder at least  75% of the time with timed tolieting/Active    Problem: Impaired Bowel Management  Bowel Mgmt: Primary Team Goal: Patient will be able sustain bowel continence  with timed toileting at least 75% of the time/Active    Problem: Impaired Communication  Communication: Primary Team Goal: Caregiver will demonstrate appropriate cueing  (i.e. probing questions) to address word finding difficutly in order to  facilitate patient's communicaiton of wants/needs in community./    Problem: Impaired Mobility  Mobility: Primary Team Goal: Patient will transfer from sitting to standing and  ambulate 50 feet with LRAD with mod I, ambulate 150 feet with LRAD with  supervision, and climb 12 steps with unilateral HR with CGA so patient can  maintain modified independence on one level and access all levels  of sons'  homes./Active    Problem: Impaired Nutrition / Hydration  Nutrition:  Primary Team Goal: Maintain meal intake of 75% or more through LOS/    Problem: Impaired Pain Management  Pain Mgmt: Primary Team Goal: Patient will verbalized pain less than 2 out of 10  with the use of medication and positioning techniques.Marland KitchenJorje Guild    Problem: Impaired Self-care Mgmt/ADL/IADL  Self Care: Primary Team Goal: Glenda Green will perform basic UB/LB dressing at  MOD I./Active    Problem: Safety Risk and Restraint  Safety: Primary Team Goal: Patient will recognize limitations and call for  assistance before attempting mobility 100% of the time in order to prevent  fall./Active    Add/Update Problems from this Treatment:  Update of existing problem:   Self Care Management: Glenda Green is Min A bathing, S grooming, Min A UB  dressing, Min A LB dressing, Total A for incontinence care, Min A transfers.    Discipline:  Occupational Therapy    Please review Integrated Patient View Care Plan Flowsheet for Team identified  Problems, Interventions, and Goals.    3 Hour Rule Minutes: 60 minutes of OT treatment this session count towards  intensity and duration of therapy requirement. Patient was seen for the full  scheduled time of OT treatment this session.  Therapy Mode Minutes: Individual: 60 minutes.    Signed by: Melinda Crutch, OTR/L 08/28/2016 10:00:00 AM

## 2016-08-28 NOTE — Consults (Signed)
Reason for Consultation:   Right chest wall      History of present illness:  Ms Lucking 80 yo was admitted 08/25/16 due to having suffered a left MCA stroke. She had surgery for right thoracotomy anterior approach due to cardiac mass 08/21/16, POD#7. PMH includes history of hypertension, hyperlipidemia, type 2 diabetes mellitus, chronic kidney disease, partial left foot amputation in 2015, coronary artery disease, coronary artery bypass graft 2010     Wound assessment:  Right lateral chest incision under right breast 8.0 x 1.0cm incision has some fibrin on edges, scant drainage, not painful to the touch, no foul odor.       Plan:  Xeroform with 4x4 guaze secured with tape. Change daily  Monitor document changes    Loyal Jacobson RN [facility] ext [phone]

## 2016-08-29 LAB — GLUCOSE WHOLE BLOOD - POCT
Whole Blood Glucose POCT: 95 mg/dL (ref 70–100)
Whole Blood Glucose POCT: 212 mg/dL — ABNORMAL HIGH (ref 70–100)
Whole Blood Glucose POCT: 293 mg/dL — ABNORMAL HIGH (ref 70–100)
Whole Blood Glucose POCT: 161 mg/dL — ABNORMAL HIGH (ref 70–100)

## 2016-08-29 MED ORDER — FOSFOMYCIN TROMETHAMINE 3 G PO PACK
3.0000 g | PACK | ORAL | Status: DC
Start: 2016-08-29 — End: 2016-09-04
  Administered 2016-08-29 – 2016-09-02 (×3): 3 g via ORAL
  Filled 2016-08-29 (×5): qty 3

## 2016-08-29 NOTE — Rehab Progress Note ([facility]) (Signed)
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    OBJECTIVE    Interventions:       Speech Treatment: Tx targeted word retrieval strategies during various  tasks.  -Object naming: common: 10/10; unfamiliar: 9/10 with increase to 100% given  phonemic cues.  - Generative naming: Pt. able to generate about 5 items per category in 3/3  categories.  - Object description in barrier task: Pt. able to provide specific details given  min cues of 5/5 items for daughter in laws to name. Education of word retrieval  strategies  provided to daughter in laws. Both with excellent understanding and  return demonstrate.    Reading: Pt. made 2 paraphasic errors when reading short paragraph in her book  but corrected with ? cue. She was able to explain the main point in paragraph  and about 5 details c/w good comprehension. Difficulty with word retrieval.  Patient was returned to or left in bed. Handoff to nurse/nursing Tax inspector completed.  Oriented to call bell and placed within reach.  Personal items within reach.   Family present  Pain Reassessment: Pain was not reassessed as no pain was reported.    Education Provided:    Education Provided: Communication strategies.       Audience: Pt. and family members. .       Mode: Explanation.  Demonstration.       Response: Verbalized understanding.  Needs practice.  Needs reinforcement.    ASSESSMENT  Pt. continues to demonstrate ability to use  word retrieval strategies in  structured tasks. Generative naming is the most difficult. She does well in  conversation but does experience periods of WR deficits. Phonemic cues helpful.  Pt. encouraged to read education materials and her book aloud to assist with  expression as well as learning tx related information.        PLAN  Continued Speech Language Pathology is recommended to address:  Recommended Frequency/Duration/Intensity: 60 min/day, 5-6 days/week for 7-10  days from 08/26/2016, 1:1 and group tx as appropriate  Continued Activities Contributing Toward Care Plan: Cognitive-linguistic  assessment and treatment planning, dysphagia assessment, Patient/family  education, Plan of care coordination with care team, Discharge planning    3 Hour Rule Minutes: 60 minutes of SLP treatment this session count towards  intensity and duration of therapy requirement. Patient was seen for the full  scheduled time of SLP treatment this session.  Therapy Mode Minutes: Individual: 60 minutes.    Signed by: Patrick Jupiter, M.S., CCC-SLP 08/29/2016 11:00:00 AM

## 2016-08-29 NOTE — Rehab Progress Note ([facility]) (Signed)
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  Pain Reassessment: Pain was not reassessed as no pain was reported.  Patient/Caregiver Goals:  "To be able to walk without any device".    NEURO  Orientation/Awareness: Alert and Oriented x4.  Speech: No deficits noted at this time.  Behavior: Cooperative.    MEDICATIONS  IV Access: No IV access.  Dialysis Access: Patient does not have dialysis access.      Elopement Risk Level Assessment Tool  Patient Criteria: Patient is not capable of leaving the unit.  Assessment is not  applicable.      RISK ASSESSMENT FOR FALLS/INJURY    MENTAL STATUS CRITERIA:   0 - None identified.  MENTAL STATUS TOTAL: 0    AGE CRITERIA:   38 - 35 years old and above  AGE TOTAL: 2    ELIMINATION CRITERIA:   3 - Toileting with Assistance.   5 - Incontinence.   ELIMINATION TOTAL: 8    HISTORY OF FALLS CRITERIA:   2 - Unknown History.  HISTORY OF FALLS TOTAL: 2    MEDICATIONS CRITERIA:   2 or more High Risk Medications (*see list below)   MEDICATIONS TOTAL: 2    PHYSICAL MOBILITY CRITERIA:   3 - Decreased balance reaction.   1 - Weakness/impaired physical mobility.   PHYSICAL MOBILITY TOTAL: 4    FALLS RISK ASSESSMENT TOTAL: 18    Patient's Fall Risk: TOTAL SCORE >10: High Risk    Falls Interventions: Yellow fall risk marker applied to patient identification  band  Oriented to call  bell and placed within reach.  Personal items within reach.  Hourly toileting/safety checks between 6am and 10pm, then every 2 hours.  Bed alarm in place and activated.  Chair alarm in place and activated.  Clutter removed and clear path to BR.  Assistive devices positioned out of reach.  Patient and family education.    NUTRITION  Diet:  Type: Consistent carbohydrate.  Food Consistency: Regular.  Liquid Consistency: Thin.      CARDIOVASCULAR     Bilateral lower extremities  Nail Bed Color: Pink.   No edema or redness present.  Pulses:   Apical Pulse: Regular. Strong. Rate is 68 .   Patient does not have a pacemaker.   Patient does not have a defibrillator.    CARDIOPULMONARY  Lung Sounds:   Upper lobes. Clear.   Lower lobes. Clear.  Type of Respirations: Regular.  Cough: No cough noted.  Respiratory Support: The patient does not require any respiratory support.  Respiratory Equipment: None. Pt. is at 98% on RA    INTEGUMENTARY  Skin:  Temperature: Warm  Turgor: Normal for age  Moisture: Dry  Color of skin: Normal for Race/Ethnicity  Capillary Refill: Less than 3  seconds   Bruising: Pt.has scattered brusiing on BUE  Wounds/Incisions:     Surgical Incision: R Breast incision Length: 1 centimeter(s) OTA No signs of  infection.  Drainage: Incision without drainage.  Odor:  No  Incision Care: Per protocol.     Surgical Incision: R Breast Fold incision Length: 11 centimeter(s) with glue.  Erythema present.  Drainage: Scant amount of serosanguineous drainage.  Odor:  No  Incision Care: Per protocol.     Surgical Incision: R Chest incision Length: 1 centimeter(s) with glue. No signs  of infection.  Drainage: Incision without drainage.  Odor:  No  Incision Care: Per protocol.     Surgical Incision: R Groin incision Length: 5 centimeter(s) with glue. No signs  of infection.  Drainage: Incision without drainage.  Odor:  No  Incision Care: Per protocol.       R Breast X2 incisions 0.5 cm. Clean dry and Intact.  Braden Scale  for Predicting Pressure Sore Risk: Sensory Perception: Slightly  limited  Moisture: Occasionally moist  Activity: Chairfast  Mobility: Slightly limited  Nutrition: Adequate  Friction and Shear: Potential problem  Braden Score: 16  Level of Risk: At risk (15-18)   Incontinent Protocol   Turn patient every 2 hours   Assist patient to the bathroom every 2 hours    GASTROINTESTINAL  Abdomen: Soft. Nontender.  Bowel Sounds:  Active bowel sounds audible in all four quadrants.  Date of Last Bowel Movement:  08/28/2016  Current Bowel Pattern:  Continent   No Problems/Complaints with Bowel Elimination Assessed.    GENITOURINARY  Current Bladder Pattern: Incontinent  Color:  Yellow   Patient denies problems with urination and/or catheter.    Bowel and Bladder Output:                       Bladder (# only)             Bowel (# only)  Number of Episodes  Continent            2                            1  Incontinent          0                            0    MUSCULOSKELETAL  Upper Body: Right hand swollen and weak  Lower Body: right leg weakness    Functional Measures       Bladder Score = 1. Patient performs less than 25% of tasks and requires total  assistance for bladder management. Helper provides total assist to completely  apply and remove brief.    BLADDER ACCIDENTS THIS SHIFT:  0 . Patient has not had an accident but used an  adult brief this assessment.     Bowel Score = 1. Patient performs less than 25% of tasks and requires total  assistance for bowel management. Helper provides total assist for set up and  holding bucket and/or clean up after a spill    BOWEL ACCIDENTS THIS SHIFT: 0 . Patient has not had an accident, but used a  bucket of bedside commode.    Education Provided:    Education Provided: Precautions. Pain management. Plan of care.       Audience: Patient.       Mode: Explanation.  Response: Verbalized understanding.    Discharge: Patient is not being discharged at this time.    Long Term Goals: 1.  Patient will be continent with bladder 100% most of the  time.   2. Patient will remain continent of bowel until discharge.   3. Patient will recognize limitations and call for assistance before attempting  mobilty 100% of the time in order to prevent falls.   4. Surgical wound will be clean with no drainage to minimize risk of infection.   5. Patient will verbalized pain level 2/10 after giving medication and  repositioning techniques.  2 weeks after 08/25/2016  Short Term Goals: 1. Patient will call for assistance 75% of the time before  attempting transfer to prevent falls during hospitalization. - New Goal   2. Patient will verbalize pain  less than 3 out of 10 with or without  medication.   3. Patient identifies correct drug indications, dosage, administration times  and side effects 75% on the medications.  1 week after 08/25/2016    PROGRESS TOWARD GOALS: Pt. is progressing towards Goals. Pt. does not complain  of any pain.    PLAN: Nursing Specific Interventions  Bladder Management. Bowel Management. Medication Management. Skin Management.  Wound Management.  Continue with the current Nursing Plan of Care.    TEAM CARE PLAN  Identified problems from team documentation:  Problem: Impaired Bladder Management  Bladder Mgmt: Primary Team Goal: Patient will be continent of bladder at least  75% of the time with timed tolieting/Active    Problem: Impaired Bowel Management  Bowel Mgmt: Primary Team Goal: Patient will be able sustain bowel continence  with timed toileting at least 75% of the time/Active    Problem: Impaired Communication  Communication: Primary Team Goal: Caregiver will demonstrate appropriate cueing  (i.e. probing questions) to address word finding difficutly in order to  facilitate patient's communicaiton of wants/needs in community./    Problem: Impaired Mobility  Mobility: Primary Team Goal: Patient will transfer from sitting to standing and  ambulate 50 feet with LRAD with mod I, ambulate 150  feet with LRAD with  supervision, and climb 12 steps with unilateral HR with CGA so patient can  maintain modified independence on one level and access all levels of sons'  homes./Active    Problem: Impaired Nutrition / Hydration  Nutrition: Primary Team Goal: Maintain meal intake of 75% or more through LOS/    Problem: Impaired Pain Management  Pain Mgmt: Primary Team Goal: Patient will verbalized pain less than 2 out of 10  with the use of medication and positioning techniques.Marland KitchenJorje Guild    Problem: Impaired Self-care Mgmt/ADL/IADL  Self Care: Primary Team Goal: Ms. Mulvaney will perform basic UB/LB dressing at  MOD I./Active    Problem: Safety Risk and Restraint  Safety: Primary Team Goal: Patient will recognize limitations and call for  assistance before attempting mobility 100% of the time in order to prevent  fall./Active    Add/Update Problems from this assessment:  No updates at this time.    Please review Integrated Patient View Care Plan Flowsheet for Team identified  Problems, Interventions, and Goals.    Signed by: Bess Kinds, RN 08/29/2016 11:04:00 AM

## 2016-08-29 NOTE — Rehab Progress Note ([facility]) (Signed)
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  Location: general discomfort  Type: Chronic  Quality: Aching.  Pain Scale: Numeric.  Patient reports a pain level of 4 out of 10.  Patient's acceptable level of pain 0 out of 10.   Interferes with sleep. physical activity.  Pain is alleviated by: pain med  Pain is exacerbated by: activity Patient medicated.  Pain Reassessment:  Response to Pain Intervention: pt sleep  Post Intervention Pain Quality:  None.  Patient Reports Post Intervention Pain Level of: 0 out of 10  Pain Acceptable: Yes  Patient/Caregiver Goals:  "To be able to walk without any device".    NEURO  Orientation/Awareness: Alert and Oriented x3.  Speech: No deficits noted at this time.  Behavior: Cooperative.    MEDICATIONS  IV Access: No IV access.  Dialysis Access: Patient does not have dialysis access.      Elopement Risk Level Assessment Tool  Patient Criteria: Patient is not capable of leaving the unit.  Assessment is not  applicable.      RISK ASSESSMENT FOR FALLS/INJURY    MENTAL STATUS CRITERIA:   0 - None identified.  MENTAL STATUS TOTAL: 0    AGE CRITERIA:   78 - 90 years old and above  AGE TOTAL: 80    ELIMINATION CRITERIA:   3 - Toileting with Assistance.   ELIMINATION TOTAL: 3    HISTORY OF FALLS CRITERIA:   2 - Unknown History.  HISTORY OF FALLS TOTAL: 2    MEDICATIONS CRITERIA:   2 or more High Risk  Medications (*see list below)   MEDICATIONS TOTAL: 2    PHYSICAL MOBILITY CRITERIA:   3 - Decreased balance reaction.   1 - Weakness/impaired physical mobility.   PHYSICAL MOBILITY TOTAL: 4    FALLS RISK ASSESSMENT TOTAL: 13    Patient's Fall Risk: TOTAL SCORE >10: High Risk    Falls Interventions: Yellow fall risk marker applied to patient identification  band  Oriented to call bell and placed within reach.  Personal items within reach.  Hourly toileting/safety checks between 6am and 10pm, then every 2 hours.  Bed alarm in place and activated.  Chair alarm in place and activated.  Initiate fall care plan and outcome.  Clutter removed and clear path to BR.  Assistive devices positioned out of reach.  Reoriented PRN.    NUTRITION  Diet:  Type: Consistent carbohydrate.  Food Consistency: Regular.  Liquid Consistency: Thin.      CARDIOVASCULAR     Bilateral lower extremities  Nail Bed Color: Pink.   No edema or redness present.  Pulses:   Left Radial Pulse: Regular. Strong. Rate is 76 .   Patient does not have a pacemaker.   Patient does not have a defibrillator.  CARDIOPULMONARY  Lung Sounds:   Upper lobes. Clear.   Lower lobes. Clear.  Type of Respirations: Regular.  Cough: No cough noted.  Respiratory Support: The patient does not require any respiratory support.  Respiratory Equipment: None. 96% RA    INTEGUMENTARY  Skin:  Temperature: Warm  Turgor: Normal for age  Moisture: Dry  Color of skin: Normal for Race/Ethnicity  Capillary Refill: Less than 3 seconds   Bruising: bruising bil UE  Wounds/Incisions:  Surgical Incision: right breast incision incision Length: 1  centimeter(s) open No signs of infection.  Drainage: Incision without drainage.  Odor:  No  Incision Care: Per protocol.     Surgical Incision: right breast fold incision incision Length: 11 centimeter(s)  with glue. Erythema present. Pain present.  Drainage: Minimal amount of serosanguineous drainage.  Odor:  No  Incision Care: Clean with NS, pat  dried with gauze, nystatin pawder applied over  periwound redenned skin, covered with 4x4 gauze     Surgical Incision: right chest incision Length: 1 centimeter(s) OTA No signs of  infection.  Drainage: Incision without drainage.  Odor:  No  Incision Care: Per protocol.     Surgical Incision: right breast X 2 incision Length: 0.5 centimeter(s) cleaded  with NS, pat dried and covered with gauze and tape No signs of infection.  Drainage: Incision without drainage.  Odor:  No  Incision Care: Per protocol.     Surgical Incision: right groin incision Length: 5 centimeter(s) with glue. No  signs of infection.  Drainage: Incision without drainage.  Odor:  No  Incision Care: Clean with NS, pat dried with gauze, nystatin pawder applied over  periwound redenned skin, covered with 4x4 gauze       LUE skin tears healing, OTA. Redness on the sacral area resolved  Braden Scale for Predicting Pressure Sore Risk: Sensory Perception: Slightly  limited  Moisture: Occasionally moist  Activity: Chairfast  Mobility: Slightly limited  Nutrition: Adequate  Friction and Shear: Potential problem  Braden Score: 16  Level of Risk: At risk (15-18)   Pressure Relief every 30 minutes while in wheelchair   Turn patient every 2 hours   Assist patient to the bathroom every 2 hours   Incontinent Protocol    GASTROINTESTINAL  Abdomen: Soft. Nontender.  Bowel Sounds:  Active bowel sounds audible in all four quadrants.  Date of Last Bowel Movement:  08/27/16  Current Bowel Pattern:  Incontinent   No Problems/Complaints with Bowel Elimination Assessed.    GENITOURINARY  Current Bladder Pattern: Incontinent Stress incontinence only.  Color:  Yellow   Patient denies problems with urination and/or catheter.    Bowel and Bladder Output:                       Bladder (# only)             Bowel (# only)  Number of Episodes  Continent            0                            0  Incontinent          3                            0    MUSCULOSKELETAL  Upper Body:  Right hand swollen and weak  Lower Body: right  leg weakness    Functional Measures       Bladder Score = 2. Patient performs 25-49% of tasks and requires maximal  assistance for bladder management. Helper provides most of the assist to apply  AND remove brief.    BLADDER ACCIDENTS THIS SHIFT:  0 . Patient has not had an accident but used an  adult brief this assessment.     Bowel Score = 3. Patient performs 50-74% of tasks and requires moderate  assistance for bowel management. Helper provides assist to position the bucket  and holds it in place less than 50% of the time.    BOWEL ACCIDENTS THIS SHIFT: 0 . Patient has not had an accident, but used a  bucket of bedside commode.    COMPREHENSION: Auditory comprehension is the usual mode. Comprehension Score =  6, Modified Independence.  Patient comprehends complex/abstract information in  their primary language, requiring: Additional time.    EXPRESSION: Vocal expression is the usual mode. Expression Score = 6, Modified  Independent.  Patient expresses complex/abstract information in their primary  language, requiring: Additional time.    SOCIAL INTERACTION: Social Interaction Score = 7, Independent. Patient is  completely independent for social interaction.  There are no activity  limitations.    PROBLEM SOLVING: Problem Solving Score = 6, Modified Independence.  Patient  makes appropriate decisions in order to solve complex problems, but requires  extra time.    MEMORY: Memory Score = 6, Modified Independence.  Patient is modified  independent for memory, requiring: Requires additional time.    Education Provided:    Education Provided: Pain management. Pain scale. Medication options. Side  effects. Safety. Swallow precautions.       Audience: Patient.       Mode: Explanation.       Response: Verbalized understanding.    Discharge: Patient is not being discharged at this time.    Long Term Goals: 1. Patient will be continent with bladder 100% most of the  time.   2.  Patient will remain continent of bowel until discharge.   3. Patient will recognize limitations and call for assistance before attempting  mobilty 100% of the time in order to prevent falls.   4. Surgical wound will be clean with no drainage to minimize risk of infection.   5. Patient will verbalized pain level 2/10 after giving medication and  repositioning techniques.  2 weeks after 08/25/2016  Short Term Goals: 1. Patient will call for assistance 75% of the time before  attempting transfer to prevent falls during hospitalization.   2. Patient will verbalize pain  less than 3 out of 10 with or without  medication.   3. Patient identifies correct drug indications, dosage, administration times  and side effects 75% on the medications.  1 week after 08/25/2016    PROGRESS TOWARD GOALS: SHORT TERM GOAL REVIEW:       1. Patient will call for assistance 75% of the time before attempting  transfer to prevent falls during hospitalization. - Met       New Goal: Patient will call for assistance 75% of the time before  attempting transfer to prevent falls during hospitalization.  Time frame to achieve short term goal(s):  1 week after 08/25/2016   LONG TERM GOAL REVIEW:       New Goal 1. Patient will be continent with bladder 100% most of the time.  Timeframe to achieve long term goal(s):  2 weeks after 08/25/2016  PLAN: Nursing Specific Interventions  Bladder Management. Bowel Management. Medication Management. Skin Management.  Continue with the current Nursing Plan of Care.    TEAM CARE PLAN  Identified problems from team documentation:  Problem: Impaired Bladder Management  Bladder Mgmt: Primary Team Goal: Patient will be continent of bladder at least  75% of the time with timed tolieting/Active    Problem: Impaired Bowel Management  Bowel Mgmt: Primary Team Goal: Patient will be able sustain bowel continence  with timed toileting at least 75% of the time/Active    Problem: Impaired Communication  Communication:  Primary Team Goal: Caregiver will demonstrate appropriate cueing  (i.e. probing questions) to address word finding difficutly in order to  facilitate patient's communicaiton of wants/needs in community./    Problem: Impaired Mobility  Mobility: Primary Team Goal: Patient will transfer from sitting to standing and  ambulate 50 feet with LRAD with mod I, ambulate 150 feet with LRAD with  supervision, and climb 12 steps with unilateral HR with CGA so patient can  maintain modified independence on one level and access all levels of sons'  homes./Active    Problem: Impaired Nutrition / Hydration  Nutrition: Primary Team Goal: Maintain meal intake of 75% or more through LOS/    Problem: Impaired Pain Management  Pain Mgmt: Primary Team Goal: Patient will verbalized pain less than 2 out of 10  with the use of medication and positioning techniques.Marland KitchenJorje Guild    Problem: Impaired Self-care Mgmt/ADL/IADL  Self Care: Primary Team Goal: Ms. Moffitt will perform basic UB/LB dressing at  MOD I./Active    Problem: Safety Risk and Restraint  Safety: Primary Team Goal: Patient will recognize limitations and call for  assistance before attempting mobility 100% of the time in order to prevent  fall./Active    Add/Update Problems from this assessment:  No updates at this time.    Please review Integrated Patient View Care Plan Flowsheet for Team identified  Problems, Interventions, and Goals.    Signed by: Zorita Pang, RN 08/28/2016 11:50:00 PM

## 2016-08-29 NOTE — Progress Notes (Signed)
[facility] Physiatry Attending Face to Face Progress Note   Functional Status/Update:   I reviewed patient's therapy notes to assess functional status and ongoing need for therapies. Of note, the patient was seen and evaluated in rounds with nursing.  Medical records and imaging studies were reviewed.    Subjective:   No constipation. No chest pain. No shortness of breath.     Objective:   Vitals:    08/29/16 0829 08/29/16 1306 08/29/16 1401 08/29/16 1631   BP: 129/61 128/66 131/69 124/69   Pulse: 68 77 74 63   Resp:    20   Temp:    97.8 F (36.6 C)   TempSrc:    Oral   SpO2:    94%   Weight:       Height:           Physical Examination:   Appears stated age.   Anicteric sclerae. Conjunctivae non-injected. EOMI.   Moist mucous membranes.   +S1S2 Heart rate and rhythm are regular. No significant lower limb edema.   Lungs are clear to auscultation bilaterally. No wheezes, rales, or rhonchi. Good respiratory effort.   Soft. Non-tender. Normoactive bowel sounds.   Alert Pleasant and cooperative     New Labs:  Results     Procedure Component Value Units Date/Time    Glucose Whole Blood - POCT [[id number]]  (Abnormal) Collected:  08/29/16 1629     Updated:  08/29/16 1751     POCT - Glucose Whole blood 293 (H) mg/dL     Glucose Whole Blood - POCT [[id number]]  (Abnormal) Collected:  08/29/16 1152     Updated:  08/29/16 1220     POCT - Glucose Whole blood 212 (H) mg/dL     Glucose Whole Blood - POCT [[id number]] Collected:  08/29/16 0620     Updated:  08/29/16 0627     POCT - Glucose Whole blood 95 mg/dL     Glucose Whole Blood - POCT [[id number]]  (Abnormal) Collected:  08/28/16 2153     Updated:  08/28/16 2158     POCT - Glucose Whole blood 299 (H) mg/dL     Urine culture [[id number]] Collected:  08/25/16 2214    Specimen:  Urine from Urine, Clean Catch Updated:  08/28/16 1949    Narrative:       ORDER#: [id number]                                    ORDERED BY: Fabio Bering  SOURCE: Urine, Clean Catch                            COLLECTED:  08/25/16 22:14  ANTIBIOTICS AT COLL.:                                RECEIVED :  08/26/16 02:51  Culture Urine                              PRELIM      08/28/16 19:49   +  08/28/16   >100,000 CFU/ML MDR Escherichia coli               Fosfomycin result to follow  This multidrug resistant (MDR) Enterobacteriaceae is resistant             to ceftriaxone and may not respond optimally to B-lactam             antibiotics (excluding carbapenems).             [place] [facility] June 2015    08/28/16   >100,000 CFU/ML MDR Escherichia coli               Second morphotype             Fosfomycin result to follow             This multidrug resistant (MDR) Enterobacteriaceae is resistant             to ceftriaxone and may not respond optimally to B-lactam             antibiotics (excluding carbapenems).             [place] [facility] June 2015    _____________________________________________________________________________                                   MDR E.coli      MDR E.coli     ANTIBIOTICS                     MIC  INTRP      MIC  INTRP      _____________________________________________________________________________  Amikacin                        <=8    S        <=8    S        Amoxicillin/CA                 16/8    I       16/8    I        Ampicillin                      >16    R        >16    R        Aztreonam                       >16    R        >16    R        Cefazolin                       >16    R        >16    R        Cefepime                        >16    R        >16    R        Ceftazidime                     >16    R        >16    R        Ceftriaxone                     >  32    R        >32    R        Ciprofloxacin                   >2     R        >2     R        Ertapenem                     <=0.25   S      <=0.25   S        Gentamicin                      >8     R        >8     R        Levofloxacin                    >4     R         >4     R        Meropenem                     <=0.25   S      <=0.25   S        Nitrofurantoin                 <=16    S  D1   <=16    S  D1    Piperacillin/Tazobactam        16/4    S       16/4    S        Tetracycline                    >8     R        >8     R        Tobramycin                      >8     R        >8     R        Trimethoprim/Sulfamethoxazole <=0.5/9  S      <=0.5/9  S          -----DRUG COMMENTS----------    D1:  Nitrofurantoin should only be used for the treatment of         uncomplicated cystitis.         [place] [facility] June 2015  _____________________________________________________________________________            S=SUSCEPTIBLE     I=INTERMEDIATE     R=RESISTANT                            N/S=NON-SUSCEPTIBLE  _____________________________________________________________________________      Glucose Whole Blood - POCT [[id number]]  (Abnormal) Collected:  08/28/16 1759     Updated:  08/28/16 1837     POCT - Glucose Whole blood 223 (H) mg/dL           Current medications:   Scheduled Meds:  Current Facility-Administered Medications   Medication Dose Route Frequency   . amiodarone  200 mg Oral BID   . amLODIPine  2.5  mg Oral Daily   . aspirin EC  81 mg Oral Daily   . atorvastatin  20 mg Oral QHS   . clopidogrel  75 mg Oral Daily   . docusate sodium  100 mg Oral BID   . DULoxetine  60 mg Oral Daily   . fosfoMYCIN  3 g Oral Q48H   . heparin (porcine)  5,000 Units Subcutaneous Q12H [facility]   . insulin glargine  12 Units Subcutaneous QHS   . lactobacillus/streptococcus  1 capsule Oral Daily   . metoprolol  25 mg Oral Q12H [facility]   . mupirocin   Nasal BID   . nystatin   Topical BID     PRN Meds:.acetaminophen, benzocaine-menthol, Nursing communication: Adult Hypoglycemia Treatment Algorithm **AND** dextrose **AND** dextrose **AND** glucagon (rDNA) **AND** Nursing communication: Document Significant Event Note, hydrALAZINE, insulin regular, insulin regular, ondansetron,  polyethylene glycol, senna, traMADol    Assessment: 80 y.o. female with Acute ischemic left MCA stroke    Plan:   REHAB: Continue comprehensive and intensive inpatient rehab program, including:   Physical therapy 60-120 min daily, 5-6 times per week, Occupational therapy  60-120 min daily, 5-6 times per week, Case management and Rehabilitation nursing    Will continue to address the following impairments and issues:  Mobility, ADLs and Medication management  Blood glucose levels are fluctuating.  We will evaluate before meals and at bedtime for now and treat elevated blood sugar levels with sliding scale insulin.

## 2016-08-29 NOTE — Rehab Progress Note ([facility]) (Signed)
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    OBJECTIVE  Vital Signs:                       Before Activity              After Activity  Vitals  Time                 1305                         1400  Position/Activity    sitting                      supine  BP Systolic          128                          131  BP Diastolic         66                           69  Pulse                77                           74    Interventions:       Therapeutic Activities:  Patient received sitting in w/c with 2  daughters-in-law present, with one attending P.T. session to observe.  Patient  received sit to stand transfer training from w/c to RW and rollator with SBA-CGA  and verbal cuing WJ:XBJY placement to optimize safety.  Gait trainng provided  times 45-50 feet times 2 reps with rollator and times 1 rep with RW with CGA.  Dynamic standing balance training provided with mirror providing visual feedback  with close SBA-intermittent minA to regain/prevent loss of balance.  Patient  practiced standing with unilateral UE support and then letting go for 7-12  seconds at a time with cuing to maintain weight on balls of feet rather than on  heels of feet to facilitate anterior weight shift/upright  trunk.  Patient  returned to room, transferring into bed to rest.  Patient was returned to or left in bed. Handoff to nurse/nursing Tax inspector completed.  Bed alarm in place and activated.  Oriented to call bell and placed within reach.  Personal items within reach.  Assistive devices positioned out of reach.  Bed placed in lowest position.    Education Provided:  Education Provided: Plan of care. Safety issues and interventions. Use of  adaptive devices. Functional transfers. Gait. Stroke. Signs /T/ Symptoms  associated with stroke, Increase physical activity, Stroke recovery       Audience: Patient and Caregiver       Mode: Explanation.  Demonstration.   Practice.       Response: Applied knowledge.  Verbalized understanding.  Needs practice.  Needs reinforcement.    ASSESSMENT  Patient continues to report feeling like she is going to lose her balance in  anterior direction when she is standing upright, contributing to posterior  weight shift and resulting LOB, requiring P.T. to provide CGA-minA to prevent  patient from falling backwards.  With unilateral UE support on stable object  placed slightly anterior to patient's trunk, she is able to maintain her  standing balance for 7-12 seconds with close SBA. Continued NMR addressing  standing balance as well as patient/family education is needed to prepare  patient for safe d/c home with family, although patient is likely to need  constant supervision immediately after d/c from rehab.    PLAN  Continued Physical Therapy is recommended.  Recommended Frequency/Duration/Intensity: daily for 60 minute sessions 5-6  days/week until 09/04/2016.  Activities Contributing Toward Care Plan: transfer training, gait training, NMR  addressing standing balance and LE motor control, patient/family education,  equipment procurement prn, and d/c planning, all provided within an  interdisciplinary, team approach to care.    3 Hour Rule Minutes: 60 minutes of PT treatment  this session count towards  intensity and duration of therapy requirement. Patient was seen for the full  scheduled time of PT treatment this session.  Therapy Mode Minutes: Individual: 60 minutes.    Signed by: Dorothey Baseman, PT, DPT 08/29/2016 2:00:00 PM

## 2016-08-29 NOTE — Progress Notes (Addendum)
Infectious Diseases  Progress Note     Impression:   80 year old female with diabetes, obstructive uropathy, relieved by stent placement on August 05, 2016, with a negative urine culture at that time.  I am concerned about the possibility that the stent will be colonized with the MDR E. Coli. She remains afebrile, nontoxic, and she has a normal white count. Unfortunately, she is growing an MDR E coli.  The vast majority of these are sensitive to [facility] and as she has lower tract infection, we can use it.  I am not sure if this will be capable of clearing stents, but perhaps we should just use a longer course    Recommendations:   Will change to [facility] and give every other day times at least 3 doses with the idea of maybe having the stent changed after the 2nd dose.  Duration of therapy will depend on [employer] plans.  ID will f/u Mon and PRN    Subjective:   No new ID problems noted    Review of Systems  Patient without vomiting, diarrhea, rash, cough, shortness of breath, abdominal or chest pain.      Objective:   BP 129/61   Pulse 68   Temp 98.7 F (37.1 C)   Resp 16   Ht 1.626 m (5\' 4" )   Wt 74.6 kg (164 lb 7.4 oz)   SpO2 98%   BMI 28.23 kg/m     Temp (24hrs), Avg:98.4 F (36.9 C), Min:98 F (36.7 C), Max:98.7 F (37.1 C)      General: awake, alert, oriented x 3; no acute distress.  Cardiovascular: regular rate and rhythm, 1/6 murmur best heard at the base, no rubs or gallops  Lungs: clear to auscultation bilaterally, without wheezing, rhonchi, or rales  Abdomen: soft, non-tender, non-distended; no palpable masses, no hepatosplenomegaly, normoactive bowel sounds, no rebound or guarding  Extremities: no clubbing, cyanosis, or edema  Other: No CVA tenderness        Labs:    Recent Labs  Lab 08/28/16  0435 08/27/16  0546 08/26/16  0446   WBC 10.78 13.19* 10.74   Hgb 11.0* 11.0* 11.2*   Hematocrit 34.0* 33.2* 34.9*   Platelets 292 260 246       Recent Labs  Lab 08/28/16  0435 08/27/16  0546  08/26/16  0446   Sodium 138 136 138   Potassium 4.9 4.9 4.5   Chloride 102 102 102   CO2 24 24 27    BUN 26* 29* 34*   Creatinine 1.6* 1.7* 1.8*   Calcium 8.9 8.7 8.9   Albumin  --  2.7* 3.0*   Protein, Total  --  5.7* 6.1   Bilirubin, Total  --  0.4 0.4   Alkaline Phosphatase  --  78 82   ALT  --  16 17   AST (SGOT)  --  10 10   Glucose 150* 215* 105*

## 2016-08-30 LAB — GLUCOSE WHOLE BLOOD - POCT
Whole Blood Glucose POCT: 113 mg/dL — ABNORMAL HIGH (ref 70–100)
Whole Blood Glucose POCT: 245 mg/dL — ABNORMAL HIGH (ref 70–100)
Whole Blood Glucose POCT: 335 mg/dL — ABNORMAL HIGH (ref 70–100)
Whole Blood Glucose POCT: 290 mg/dL — ABNORMAL HIGH (ref 70–100)

## 2016-08-30 NOTE — Progress Notes (Signed)
[facility] Physiatry Attending Face to Face Progress Note   Functional Status/Update:   I reviewed patient's therapy notes to assess functional status and ongoing need for therapies. Of note, the patient was seen and evaluated in rounds with nursing.    Subjective:   No constipation. No chest pain. No shortness of breath.     Objective:   Vitals:    08/29/16 1631 08/29/16 2159 08/30/16 0708 08/30/16 0904   BP: 124/69 141/61 158/85 128/55   Pulse: 63 70 67 76   Resp: 20  18    Temp: 97.8 F (36.6 C)  97.1 F (36.2 C)    TempSrc: Oral  Oral    SpO2: 94%  97%    Weight:       Height:           Physical Examination:   Appears stated age.   Anicteric sclerae. Conjunctivae non-injected.   Moist mucous membranes.   +S1S2 Heart rate and rhythm are regular.   Lungs are clear to auscultation bilaterally. No wheezes, rales, or rhonchi.   Soft. Non-tender. Normoactive bowel sounds.   Alert Pleasant and cooperative Oriented x 3     New Labs:  Results     Procedure Component Value Units Date/Time    Glucose Whole Blood - POCT [[id number]]  (Abnormal) Collected:  08/30/16 1142     Updated:  08/30/16 1210     POCT - Glucose Whole blood 245 (H) mg/dL     Glucose Whole Blood - POCT [[id number]]  (Abnormal) Collected:  08/30/16 0705     Updated:  08/30/16 0718     POCT - Glucose Whole blood 113 (H) mg/dL     Urine culture [[id number]] Collected:  08/25/16 2214    Specimen:  Urine from Urine, Clean Catch Updated:  08/29/16 2316    Narrative:       [id number] called Micro Result of MDR. Result read back by: [id number], by [id number] on  08/28/2016 at 20:33  ORDER#: [id number]                                    ORDERED BY: Fabio Bering  SOURCE: Urine, Clean Catch                           COLLECTED:  08/25/16 22:14  ANTIBIOTICS AT COLL.:                                RECEIVED :  08/26/16 02:51  [id number] called Micro Result of MDR. Result read back by: [id number], by [id number] on 08/28/2016 at 20:33  Culture Urine                              FINAL       08/29/16  23:16   +  08/28/16   >100,000 CFU/ML MDR Escherichia coli               This multidrug resistant (MDR) Enterobacteriaceae is resistant             to ceftriaxone and may not respond optimally to B-lactam             antibiotics (excluding carbapenems).             [place] [facility]  [facility] June 2015    08/28/16   >100,000 CFU/ML MDR Escherichia coli               Second morphotype             This multidrug resistant (MDR) Enterobacteriaceae is resistant             to ceftriaxone and may not respond optimally to B-lactam             antibiotics (excluding carbapenems).             [place] [facility] June 2015    _____________________________________________________________________________                                            MDR E.coli                         MDR E.coli               ANTIBIOTICS                     MIC  INTRP     MIC   INTRP        MIC  INTRP     MIC   INTRP        _____________________________________________________________________________  Amikacin                        <=8    S                          <=8    S                          Amoxicillin/CA                 16/8    I                         16/8    I                          Ampicillin                      >16    R                          >16    R                          Aztreonam                       >16    R                          >16    R                          Cefazolin                       >16  R                          >16    R                          Cefepime                        >16    R                          >16    R                          Ceftazidime                     >16    R                          >16    R                          Ceftriaxone                     >32    R                          >32    R                          Ciprofloxacin                   >2     R                          >2     R                          Ertapenem                      <=0.25   S                        <=0.25   S                          Fosfomycin                                     1.0     S                         1.0     S          Gentamicin                      >8     R                          >8  R                          Levofloxacin                    >4     R                          >4     R                          Meropenem                     <=0.25   S                        <=0.25   S                          Nitrofurantoin                 <=16    S  D1                     <=16    S  D1                      Piperacillin/Tazobactam        16/4    S                         16/4    S                          Tetracycline                    >8     R                          >8     R                          Tobramycin                      >8     R                          >8     R                          Trimethoprim/Sulfamethoxazole <=0.5/9  S                        <=0.5/9  S                            -----DRUG COMMENTS----------    D1:  Nitrofurantoin should only be used for the treatment of         uncomplicated cystitis.         [place] [facility] June 2015  _____________________________________________________________________________  S=SUSCEPTIBLE     I=INTERMEDIATE     R=RESISTANT                            N/S=NON-SUSCEPTIBLE  _____________________________________________________________________________      Glucose Whole Blood - POCT [[id number]]  (Abnormal) Collected:  08/29/16 2150     Updated:  08/29/16 2154     POCT - Glucose Whole blood 161 (H) mg/dL     Glucose Whole Blood - POCT [[id number]]  (Abnormal) Collected:  08/29/16 1629     Updated:  08/29/16 1751     POCT - Glucose Whole blood 293 (H) mg/dL           Current medications:   Scheduled Meds:  Current Facility-Administered Medications   Medication Dose Route Frequency   . amiodarone  200 mg Oral BID   . amLODIPine  2.5 mg Oral Daily   . aspirin EC  81 mg Oral  Daily   . atorvastatin  20 mg Oral QHS   . clopidogrel  75 mg Oral Daily   . docusate sodium  100 mg Oral BID   . DULoxetine  60 mg Oral Daily   . fosfoMYCIN  3 g Oral Q48H   . heparin (porcine)  5,000 Units Subcutaneous Q12H [facility]   . insulin glargine  12 Units Subcutaneous QHS   . lactobacillus/streptococcus  1 capsule Oral Daily   . metoprolol  25 mg Oral Q12H [facility]   . nystatin   Topical BID     PRN Meds:.acetaminophen, benzocaine-menthol, Nursing communication: Adult Hypoglycemia Treatment Algorithm **AND** dextrose **AND** dextrose **AND** glucagon (rDNA) **AND** Nursing communication: Document Significant Event Note, hydrALAZINE, insulin regular, insulin regular, ondansetron, polyethylene glycol, senna, traMADol    Assessment: 80 y.o. female with Acute ischemic left MCA stroke    Plan:   REHAB: Continue comprehensive and intensive inpatient rehab program, including:   Physical therapy 60-120 min daily, 5-6 times per week, Occupational therapy  60-120 min daily, 5-6 times per week, Case management and Rehabilitation nursing    Will continue to address the following impairments and issues:  Mobility, ADLs and Medication management  No new medical concerns, so far this weekend.  The patient was updated regarding her status and treatment plans.

## 2016-08-30 NOTE — Rehab Progress Note ([facility]) (Signed)
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  Pain: Patient currently without complaints of pain.  Pain Reassessment: Pain was not reassessed as no pain was reported.  Patient/Caregiver Goals:  "To be able to walk without any device".    NEURO  Orientation/Awareness: Alert and Oriented x4.  Speech: No deficits noted at this time.  Behavior: Cooperative.    MEDICATIONS  IV Access: No IV access.  Dialysis Access: Patient does not have dialysis access.      Elopement Risk Level Assessment Tool  Patient Criteria: Patient is not capable of leaving the unit.  Assessment is not  applicable.      RISK ASSESSMENT FOR FALLS/INJURY    MENTAL STATUS CRITERIA:   0 - None identified.  MENTAL STATUS TOTAL: 0    AGE CRITERIA:   34 - 46 years old and above  AGE TOTAL: 2    ELIMINATION CRITERIA:   3 - Toileting with Assistance.   5 - Incontinence.   ELIMINATION TOTAL: 8    HISTORY OF FALLS CRITERIA:   2 - Unknown History.  HISTORY OF FALLS TOTAL: 2    MEDICATIONS CRITERIA:   2 or more High Risk Medications (*see list below)   MEDICATIONS TOTAL: 2    PHYSICAL MOBILITY CRITERIA:   3 - Decreased balance reaction.   1 - Weakness/impaired physical mobility.   PHYSICAL MOBILITY TOTAL: 4    FALLS RISK ASSESSMENT TOTAL: 18    Patient's Fall Risk: TOTAL SCORE >10: High Risk    Falls Interventions: Yellow fall risk marker applied to patient  identification  band  Oriented to call bell and placed within reach.  Personal items within reach.  Hourly toileting/safety checks between 6am and 10pm, then every 2 hours.  Bed alarm in place and activated.  Clutter removed and clear path to BR.  Assistive devices positioned out of reach.    NUTRITION  Diet:  Type: Consistent carbohydrate.  Food Consistency: Regular.  Liquid Consistency: Thin.      CARDIOVASCULAR     Bilateral lower extremities  Nail Bed Color: Pink.   No edema or redness present.  Pulses:   Left Brachial Pulse: Regular. Strong. Rate is 63 .   Patient does not have a pacemaker.   Patient does not have a defibrillator.    CARDIOPULMONARY  Lung Sounds:   Upper lobes. Clear.   Lower lobes. Clear.  Type of Respirations: Regular.  Cough: No cough noted.  Respiratory Support: The patient does not require any respiratory support.  Respiratory Equipment: None.    INTEGUMENTARY  Skin:  Temperature: Warm  Turgor: Normal for age  Moisture: Dry  Color of skin: Normal for Race/Ethnicity  Capillary Refill: Less than 3 seconds   Bruising: BUE.  Wounds/Incisions:     Pressure ulcer. sacrum  Stage 1 - Non-Blanchable Erythema.  Length: 4 centimeters    Width: 3 centimeters    Depth: No.  Wound Surface Area:  12 cm   Undermining: No  Tunneling:  No  Exudate: None  Odor: No  Peri-Wound Erythema: Yes   Care provided per protocol.           All incision dressing C/D/I.  Braden Scale for Predicting Pressure Sore Risk: Sensory Perception: Slightly  limited  Moisture: Occasionally moist  Activity: Walks occasionally  Mobility: Slightly limited  Nutrition: Adequate  Friction and Shear: Potential problem  Braden Score: 17  Level of Risk: At risk (15-18)   Turn patient every 2 hours   Assist patient to the bathroom every 2 hours   Incontinent Protocol    GASTROINTESTINAL  Abdomen: Soft. Nontender.  Bowel Sounds:  Active bowel sounds audible in all four quadrants.  Date of Last Bowel Movement:  08/29/16  Current Bowel  Pattern:  Incontinent   Problems/Complaints with Bowel Elimination: Loose Stools.    GENITOURINARY  Current Bladder Pattern: Incontinent  Color:  Yellow   Patient denies problems with urination and/or catheter.    Bowel and Bladder Output:                       Bladder (# only)             Bowel (# only)  Number of Episodes  Continent            1                            1  Incontinent          1                            2    MUSCULOSKELETAL  Upper Body: Right hand swollen and weak  Lower Body: right leg weakness    Functional Measures       Bladder Score = 1. Patient performs less than 25% of tasks and requires total  assistance for bladder management. Helper provides total assist to completely  apply and remove brief.    BLADDER ACCIDENTS THIS SHIFT:  0 . Patient has not had an accident but used an  adult brief this assessment.     Bowel Score = 1. Patient performs less than 25% of tasks and requires total  assistance for bowel management. Helper provides total assist to completely  apply and remove brief OR clean up soiled linen/clothing/floor.    BOWEL ACCIDENTS THIS SHIFT: 0 . Patient has not had an accident, but used an  adult brief.    Education Provided:    Education Provided: Safety issues and interventions. Fall protocol. Supervision  requirements. Skin/wound care. Signs/symptoms of infection. Role of nutrition in  wound prevention/healing. Incision care. Critical pressure areas. Skin care for  the incontinent patient. Medication. Administration. Purpose. Side Effects.  Interaction.       Audience: Patient.       Mode: Explanation.  Demonstration.       Response: Needs reinforcement.    Discharge: Patient is not being discharged at this time.    Long Term Goals: 1. Patient will be continent with bladder 100% most of the  time.   2. Patient will remain continent of bowel until discharge.   3. Patient will recognize limitations and call  for assistance before attempting  mobilty 100% of the time in order  to prevent falls.   4. Surgical wound will be clean with no drainage to minimize risk of infection.   5. Patient will verbalized pain level 2/10 after giving medication and  repositioning techniques.  2 weeks after 08/25/2016  Short Term Goals: 1. Patient will call for assistance 75% of the time before  attempting transfer to prevent falls during hospitalization. - New Goal   2. Patient will verbalize pain  less than 3 out of 10 with or without  medication.   3. Patient identifies correct drug indications, dosage, administration times  and side effects 75% on the medications.  1 week after 08/25/2016    PROGRESS TOWARD GOALS: Patient incontient of bowel and bladder. Dependent to  staff for bowel and bladerr management.    PLAN: Nursing Specific Interventions  Bladder Management. Bowel Management. Medication Management. Skin Management.  Wound Management.  Continue with the current Nursing Plan of Care.    TEAM CARE PLAN  Identified problems from team documentation:  Problem: Impaired Bladder Management  Bladder Mgmt: Primary Team Goal: Patient will be continent of bladder at least  75% of the time with timed tolieting/Active    Problem: Impaired Bowel Management  Bowel Mgmt: Primary Team Goal: Patient will be able sustain bowel continence  with timed toileting at least 75% of the time/Active    Problem: Impaired Communication  Communication: Primary Team Goal: Caregiver will demonstrate appropriate cueing  (i.e. probing questions) to address word finding difficutly in order to  facilitate patient's communicaiton of wants/needs in community./    Problem: Impaired Mobility  Mobility: Primary Team Goal: Patient will transfer from sitting to standing and  ambulate 50 feet with LRAD with mod I, ambulate 150 feet with LRAD with  supervision, and climb 12 steps with unilateral HR with CGA so patient can  maintain modified independence on one level and access all levels of sons'  homes./Active    Problem: Impaired Nutrition  / Hydration  Nutrition: Primary Team Goal: Maintain meal intake of 75% or more through LOS/    Problem: Impaired Pain Management  Pain Mgmt: Primary Team Goal: Patient will verbalized pain less than 2 out of 10  with the use of medication and positioning techniques.Marland KitchenJorje Guild    Problem: Impaired Self-care Mgmt/ADL/IADL  Self Care: Primary Team Goal: Ms. Ringle will perform basic UB/LB dressing at  MOD I./Active    Problem: Safety Risk and Restraint  Safety: Primary Team Goal: Patient will recognize limitations and call for  assistance before attempting mobility 100% of the time in order to prevent  fall./Active    Add/Update Problems from this assessment:  No updates at this time.    Please review Integrated Patient View Care Plan Flowsheet for Team identified  Problems, Interventions, and Goals.    Signed by: Journe Hallmark, RN 08/29/2016 11:15:00 PM

## 2016-08-30 NOTE — Rehab PPS [facility] ([facility]) (Signed)
[name]  MRN: [id number]  Account: [id number]  Session Start: 08/27/2016 12:00:00 AM  Session Stop: 08/27/2016 12:00:00 AM    PPS [facility] Coordinator  Inpatient Rehabilitation Admission    IRF Admission Date:  08/25/2016      Admission Class: Initial Rehab.  [facility]  Pre-Hospital Living: Home. Pre-Hospital Living  With: (2) Family/Relatives.  Payer Source: Primary: Medicare Fee for Service  Secondary: Not Listed.  Additional InformationCopywriter, advertising.  Ethnic Group:  Caucasian.  Marital Status:  Marital Status: Widowed.    Impairment Group: 01.4 No Paresis  Date of Onset of Impairment: 08/08/2016    Etiologic Diagnosis Code(s):   Rank Code      Description  1    I63.512   Cerebral infarction due to unspecified                 occlusion or stenosis of left middle cerebral                 artery    Comorbidities:   Rank Code      Description    1    I10.      Essential (primary) hypertension  2    E78.5     Hyperlipidemia, unspecified  3    E11.9     Type 2 diabetes mellitus without complications    Are there any arthritis conditions recorded for Impairment Group, Etiologic  Diagnosis, or Comorbid Conditions that meet all of the regulatory requirements  for IRF classification (in 42 CFR 412.29(b)(2)(x), (xi), and xii))? No    Cognitive Patterns: Brief Interview for Mental Status (BIMS) was conducted.  Repetition of Three Words: Three words  Temporal Orientation Year: Correct  Temporal Orientation Month: Accurate within 5 days  Temporal Orientation Day of Week: Correct  Recall Socks: Yes, no cue required  Recall Blue: Yes, after cuing  Recall Bed: Yes, no cue required  BIMS SUMMARY SCORE: 14 Cognitively intact Patient was able to complete the Brief  Interview for Mental Status    PAI Bladder Accidents:  1 - Accidents.    Patient used medications/device this  shift.  08/26/2016 4:00:00 PM  Bladder Score = 5.  One (1) bladder accident.  PAI Bowel Accident: 0 -Accidents.     Patient used medications/device this shift.   08/25/2016 11:50:00 PM  Bowel Score = 6. Patient has no accidents, but uses a device/medications.    Care Tool Bowel and Bladder: Bladder Continence: Always incontinent.  Bowel Continence: Always incontinent (no episodes of continent bowel movements).    MEDICAL NEEDS  Height on Admission: 64 inches.  Weight on Admission: 164 pounds.  Swallowing/Nutritional Status: Regular food (solids and liquids swallowed safely  without supervision or modified food or liquid consistency).    QUALITY INDICATORS  Skin Conditions: Unhealed Pressure Ulcer(s) at Stage 1 or Higher on Admission:  Yes.  Number of Unhealed Stage 1: 1  Number of Unhealed Stage 2: 0  Number of Unhealed Stage 3: 0  Number of Unhealed Stage 4: 0  Number of Unhealed Unstageable Due to Non-removable Dressing: 0  Number of Unhealed Unstageable Due to Slough/Eschar: 0  Number of Unhealed Unstageable Due to Suspected Deep Tissue Injury: 0    Active Diagnosis: Comorbidities and Co-existing Conditions:   Diabetes Mellitus  (DM) - e.g., diabetic retinopathy, nephropathy, and neuropathy).    Signed by: Tanda Rockers, PT, PPS Coordiantor 08/27/2016 4:00:00 PM

## 2016-08-30 NOTE — Rehab Progress Note ([facility]) (Signed)
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  Pain Reassessment: Pain was not reassessed as no pain was reported.  Patient/Caregiver Goals:  "To be able to walk without any device".    NEURO  Orientation/Awareness: Alert and Oriented x3.  Speech: No deficits noted at this time.  Behavior: Cooperative.    MEDICATIONS  IV Access: No IV access.  Dialysis Access: Patient does not have dialysis access.      Elopement Risk Level Assessment Tool  Patient Criteria: Patient is not capable of leaving the unit.  Assessment is not  applicable.      RISK ASSESSMENT FOR FALLS/INJURY    MENTAL STATUS CRITERIA:   0 - None identified.  MENTAL STATUS TOTAL: 0    AGE CRITERIA:   78 - 36 years old and above  AGE TOTAL: 2    ELIMINATION CRITERIA:   3 - Toileting with Assistance.   5 - Incontinence.   10 - Urgency.   ELIMINATION TOTAL: 18    HISTORY OF FALLS CRITERIA:   2 - Unknown History.  HISTORY OF FALLS TOTAL: 2    MEDICATIONS CRITERIA:   2 or more High Risk Medications (*see list below)   MEDICATIONS TOTAL: 2    PHYSICAL MOBILITY CRITERIA:   3 - Decreased balance reaction.   1 - Weakness/impaired physical mobility.   PHYSICAL MOBILITY TOTAL: 4    FALLS RISK ASSESSMENT TOTAL: 28    Patient's Fall Risk: TOTAL SCORE >10: High Risk    Falls Interventions: Yellow fall risk marker applied to patient identification  band  Oriented  to call bell and placed within reach.  Personal items within reach.  Hourly toileting/safety checks between 6am and 10pm, then every 2 hours.  Bed alarm in place and activated.  Chair alarm in place and activated.  Clutter removed and clear path to BR.  Assistive devices positioned out of reach.  Patient and family education.    NUTRITION  Diet:  Type: Consistent carbohydrate.  Food Consistency: Regular.  Liquid Consistency: Thin.      CARDIOVASCULAR     Bilateral lower extremities  Nail Bed Color: Pink.   No edema or redness present.   Bilateral lower extremities  Nail Bed Color: Pink.   No edema or redness present.  Pulses:   Apical Pulse: Regular. Strong. Rate is 76 .   Patient does not have a pacemaker.   Patient does not have a defibrillator.    CARDIOPULMONARY  Lung Sounds:   Upper lobes. Clear.   Lower lobes. Clear.  Type of Respirations: Regular.  Cough: No cough noted.  Respiratory Support: The patient does not require any respiratory support.  Respiratory Equipment: None. Pt. is at 97% on RA    INTEGUMENTARY  Skin:  Temperature: Warm  Turgor: Normal for age  Moisture: Dry  Color of skin: Normal for Race/Ethnicity  Capillary Refill: Less than 3 seconds  Wounds/Incisions:     Surgical Incision: R Breast incision Length: 1 centimeter(s) OTA No signs of  infection.  Drainage: Incision without drainage.  Odor:  No  Incision Care: Per protocol.     Surgical Incision: R Breast fold incision Length: 11 centimeter(s) with glue.  Erythema present.  Drainage: Scant amount of serosanguineous drainage.  Odor:  No  Incision Care: Per protocol.     Surgical Incision: R Chest incision Length: 1 centimeter(s) with glue. No signs  of infection.  Drainage: Incision without drainage.  Odor:  No  Incision Care: Per protocol.     Surgical Incision: R Groin incision Length: 5 centimeter(s) with glue. No signs  of infection.  Drainage: Incision without drainage.  Odor:  No  Incision Care: Per protocol.       R Breast X2 incisions  0.5cm. Clean, dry and intact.  Braden Scale for Predicting Pressure Sore Risk: Sensory Perception: Slightly  limited  Moisture: Occasionally moist  Activity: Chairfast  Mobility: Slightly limited  Nutrition: Adequate  Friction and Shear: Potential problem  Braden Score: 16  Level of Risk: At risk (15-18)   Turn patient every 2 hours   Assist patient to the bathroom every 2 hours   Incontinent Protocol    GASTROINTESTINAL  Abdomen: Soft. Nontender. Soft. Nontender.  Bowel Sounds:  Active bowel sounds audible in all four quadrants.  Date of Last Bowel Movement:  08/30/2016  Current Bowel Pattern:  Incontinent   No Problems/Complaints with Bowel Elimination Assessed.    GENITOURINARY  Current Bladder Pattern: Incontinent  Color:  Yellow   Patient denies problems with urination and/or catheter.    Bowel and Bladder Output:                       Bladder (# only)             Bowel (# only)  Number of Episodes  Continent            0                            0  Incontinent          2                            2    MUSCULOSKELETAL  Upper Body: Right hand swollen and weak  Lower Body: right leg weakness    Functional Measures       Bladder Score = 1. Patient performs less than 25% of tasks and requires total  assistance for bladder management. Helper provides total assist to completely  apply and remove brief.    BLADDER ACCIDENTS THIS SHIFT:  0 . Patient has not had an accident but used an  adult brief this assessment.     Bowel Score = 1. Patient performs less than 25% of tasks and requires total  assistance for bowel management. Helper provides total assist to completely  apply and remove brief OR clean up soiled linen/clothing/floor.    BOWEL ACCIDENTS THIS SHIFT: 0 . Patient has not had an accident and did not  require medications or devices.    Education Provided:    Education Provided: Precautions. Plan of care.  Audience: Patient.       Mode: Explanation.       Response: Verbalized understanding.    Discharge:  Patient is not being discharged at this time.    Long Term Goals: 1. Patient will be continent with bladder 100% most of the  time.   2. Patient will remain continent of bowel until discharge.   3. Patient will recognize limitations and call for assistance before attempting  mobilty 100% of the time in order to prevent falls.   4. Surgical wound will be clean with no drainage to minimize risk of infection.   5. Patient will verbalized pain level 2/10 after giving medication and  repositioning techniques.  2 weeks after 08/25/2016  Short Term Goals: 1. Patient will call for assistance 75% of the time before  attempting transfer to prevent falls during hospitalization. - New Goal   2. Patient will verbalize pain  less than 3 out of 10 with or without  medication.   3. Patient identifies correct drug indications, dosage, administration times  and side effects 75% on the medications.  1 week after 08/25/2016    PROGRESS TOWARD GOALS: Pt. is progressing towards Goals. Pt. was timed toileted  throughout shift however pt. still had incontinent episodes before and after.    PLAN: Nursing Specific Interventions  Bladder Management. Bowel Management. Medication Management. Skin Management.  Wound Management.  Continue with the current Nursing Plan of Care.    TEAM CARE PLAN  Identified problems from team documentation:  Problem: Impaired Bladder Management  Bladder Mgmt: Primary Team Goal: Patient will be continent of bladder at least  75% of the time with timed tolieting/Active    Problem: Impaired Bowel Management  Bowel Mgmt: Primary Team Goal: Patient will be able sustain bowel continence  with timed toileting at least 75% of the time/Active    Problem: Impaired Communication  Communication: Primary Team Goal: Caregiver will demonstrate appropriate cueing  (i.e. probing questions) to address word finding difficutly in order to  facilitate patient's communicaiton of wants/needs in community./    Problem: Impaired  Mobility  Mobility: Primary Team Goal: Patient will transfer from sitting to standing and  ambulate 50 feet with LRAD with mod I, ambulate 150 feet with LRAD with  supervision, and climb 12 steps with unilateral HR with CGA so patient can  maintain modified independence on one level and access all levels of sons'  homes./Active    Problem: Impaired Nutrition / Hydration  Nutrition: Primary Team Goal: Maintain meal intake of 75% or more through LOS/    Problem: Impaired Pain Management  Pain Mgmt: Primary Team Goal: Patient will verbalized pain less than 2 out of 10  with the use of medication and positioning techniques.Marland KitchenJorje Guild    Problem: Impaired Self-care Mgmt/ADL/IADL  Self Care: Primary Team Goal: Ms. Moultry will perform basic UB/LB dressing at  MOD I./Active    Problem: Safety Risk and Restraint  Safety: Primary Team Goal: Patient will recognize limitations and call for  assistance before attempting mobility 100% of the time in order to prevent  fall./Active    Add/Update Problems from this assessment:  No updates at this time.    Please review Integrated Patient View Care Plan Flowsheet for Team identified  Problems, Interventions, and Goals.    Signed by: Bess Kinds, RN 08/30/2016 11:10:00 AM

## 2016-08-30 NOTE — Rehab Progress Note ([facility]) (Signed)
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  Pain: Patient currently without complaints of pain.    OBJECTIVE  Vital Signs:                       Before Activity              After Activity  Vitals  Time                 0900                         0950  Position/Activity    reclined in bed              reclined in bed  BP Systolic          128                          140  BP Diastolic         55                           76  Pulse                76                           -    Activities of Daily Living  Feeding: Not assessed.  Grooming: Supervision. Patient requires setup, handling supplies, and/or cueing.  The patient used the following equipment: None  Bathing: Minimal assistance. Patient needs steadying assist or physical  assistance to wash/rinse/dry 2 out of 10 areas. The patient used the following  equipment: hand held shower, grab bars  Upper Body Dressing: Supervision. Patient requires setup, handling supplies,  and/or cueing. Helper applies orthotic/prosthetic. The patient used the  following equipment: None  Lower Body Dressing: Minimal Assistance. Patient requires steadying balance or  physical assistance for 1-2/10 parts, or incidental assist with each garment, or  is dependent for closures. The patient used the following equipment: None  Toileting: Maximal assistance. Patient needs help for 2 of 3 tasks such as pants  up and cleansing. The  patient used the following equipment: grab bar, adaptive  device for stability  Toilet Transfer: Supervision. Patient requires setup of equipment/wheelchair  and/or cueing The patient used the following equipment: adaptive device for  steadying  Tub/ShowerTransfers: Patient transferred to shower. Minimal assistance. Patient  needs steadying assistance or less than 25% physical lifting. The patient used  the following equipment: shower chair, grab bars, adaptive device for steadying      Functional Measures (other than ADL tasks)  Patient was not assessed.    Interventions:  Self Care/Home Management:  Pt seen for therapeutic engagement in ADL  routine. Please refer to the above for details on current LOF. Pt agreeable to  try toileting no awareness of need to go. Pt incontinent of B/B upon removing  brief. Assisting required for thoroughness of cleansing and removal of clothing.  Pt perofrmed shower (declined washing hair). Steadying assist required when  standing (unilateral support on grab bar) due to retropulsion. Stood at sink to  groom x5 mins with SBA. Ambulated to/from bathroom wtih SBA using RW and  intermittent CGA at end of session. Pt agreeable to try strict structured timed  toileting routine to limit her difficulty with incontinence at home. F/u  provided to RN and clin tech regarding schedule with documentation in pt's room.  Patient was returned to or left in bed. Handoff to nurse/nursing Tax inspector completed.  Bed alarm in place and activated.  Oriented to call bell and placed within reach.  Personal items within reach.  Assistive devices positioned out of reach.  Pain Reassessment: Pain was not reassessed as no pain was reported.    Education Provided:    Education Provided: Activities of daily living. Functional transfers. Plan of  care. Bowel and bladder programs. Bladder training program. Bowel Training  program.       Audience: Patient.       Mode:  Explanation.  Demonstration.  Teacher, English as a foreign language provided.       Response: Verbalized understanding.  Needs practice.  Needs reinforcement.    ASSESSMENT  Ms. Clemson demonstrates improved independence wtih self care today, requiring  only assist for steadying when bathing or dressing. She is most limited by  incontinence of bowel and bladder with no sensation or awareness she's been  incontinent. Further complicating the matter is the pt's psych status with hx of  depression and poor motivation to move at all (self reported). This impacts her  hygiene and ability to manage incontinence independently as she reports she's  previously done. Continue with POC.    Long Term Goals: Ms. Sandoz will improve standing balance and perform LB  dressing with Min A.  Ms. Lenart will improve fxl endurance and perform full shower/dressing routine  with no more than Supervision.  Ms. Jollie will improve safety and verbalize 3 controllable risk factors to  prevent secondary CVA.  Ms. Dore caregivers will complete caregiver training and demonstrate  competence in safely assisting/supervising pt prior to d/c.  7-10 days from IE on 08/26/16  Short Term Goals:    Progress Towards Goals:   LONG TERM GOAL REVIEW:       1. Ms. Litaker will improve standing balance and perform LB dressing with  Min A. - Met       2. Ms. Bonello will improve fxl endurance and perform full shower/dressing  routine with no more than Supervision. - Not Met Pt requires steadying for  balance.       3. Ms. Paul will improve safety and verbalize 3 controllable risk  factors to prevent secondary CVA. - Not Met progressing toward goal       4. Ms. Hilton caregivers will complete caregiver training and  demonstrate competence in safely assisting/supervising pt prior to d/c. - Not  Met unidentified at this time.  Time frame to achieve long term goal(s):  7-10 days from IE on 08/26/16    Updated FIM Goals: No updates to the FIM goals  required.    PLAN  Continued Occupational Therapy is recommended.  Recommended Frequency/Duration/Intensity: 60-120 mins/day, 5-6 days/wk, for 7-10  days from IE on 08/26/16  Continued Activities Contributing Toward Care Plan: ADL training, safety and  mobility training, AE education, DME education, dynamic balance training, CVA  education, caregiver training prn, d/c planning, plan of care      Care Plan  Identified problems from team documentation:  Problem: Impaired Bladder Management  Bladder Mgmt: Primary Team Goal: Patient will be continent of bladder at least  75% of the time with timed tolieting/Active    Problem: Impaired Bowel Management  Bowel Mgmt: Primary Team Goal: Patient will be able sustain bowel continence  with timed toileting at least 75% of the time/Active    Problem: Impaired Communication  Communication: Primary Team Goal: Caregiver will demonstrate appropriate cueing  (i.e. probing questions) to address word finding difficutly in order to  facilitate patient's communicaiton of wants/needs in community./    Problem: Impaired Mobility  Mobility: Primary Team Goal: Patient will transfer from sitting to standing and  ambulate 50 feet with LRAD with mod I, ambulate 150 feet with LRAD with  supervision, and climb 12 steps with unilateral HR with CGA so patient can  maintain modified independence on one level and access all levels of sons'  homes./Active    Problem: Impaired Nutrition / Hydration  Nutrition: Primary Team Goal: Maintain meal intake of 75% or more through LOS/    Problem: Impaired Pain Management  Pain Mgmt: Primary Team Goal: Patient will verbalized pain less than 2 out of 10  with the use of medication and positioning techniques.Marland KitchenJorje Guild    Problem: Impaired Self-care Mgmt/ADL/IADL  Self Care: Primary Team Goal: Ms. Kessen will perform basic UB/LB dressing at  MOD I./Active    Problem: Safety Risk and Restraint  Safety: Primary Team Goal: Patient will recognize limitations and call  for  assistance before attempting mobility 100% of the time in order to prevent  fall./Active    Add/Update Problems from this Treatment:  Update of existing problem:   Self Care Management: Ms. Romanek is Min A bathing, S grooming, S UB dressing,  Min A LB dressing, S-CGA transfers, Max A toileting, incontinent of B/B    Discipline:  Occupational Therapy    Please review Integrated Patient View Care Plan Flowsheet for Team identified  Problems, Interventions, and Goals.    3 Hour Rule Minutes: 60 minutes of OT treatment this session count towards  intensity and duration of therapy requirement. Patient was seen for the full  scheduled time of OT treatment this session.  Therapy Mode Minutes: Individual: 60 minutes.    Signed by: Melinda Crutch, OTR/L 08/30/2016 10:00:00 AM

## 2016-08-31 LAB — CBC
Absolute NRBC: 0 10*3/uL
Hematocrit: 31.9 % — ABNORMAL LOW (ref 37.0–47.0)
Hgb: 10.3 g/dL — ABNORMAL LOW (ref 12.0–16.0)
MCH: 27.9 pg — ABNORMAL LOW (ref 28.0–32.0)
MCHC: 32.3 g/dL (ref 32.0–36.0)
MCV: 86.4 fL (ref 80.0–100.0)
MPV: 10 fL (ref 9.4–12.3)
Nucleated RBC: 0 /100 WBC (ref 0.0–1.0)
Platelets: 369 10*3/uL (ref 140–400)
RBC: 3.69 10*6/uL — ABNORMAL LOW (ref 4.20–5.40)
RDW: 15 % (ref 12–15)
WBC: 13.14 10*3/uL — ABNORMAL HIGH (ref 3.50–10.80)

## 2016-08-31 LAB — BASIC METABOLIC PANEL
Anion Gap: 10 (ref 5.0–15.0)
BUN: 29 mg/dL — ABNORMAL HIGH (ref 7–19)
CO2: 22 mEq/L (ref 22–29)
Calcium: 8.5 mg/dL (ref 7.9–10.2)
Chloride: 105 mEq/L (ref 100–111)
Creatinine: 1.9 mg/dL — ABNORMAL HIGH (ref 0.6–1.0)
Glucose: 110 mg/dL — ABNORMAL HIGH (ref 70–100)
Potassium: 4.5 mEq/L (ref 3.5–5.1)
Sodium: 137 mEq/L (ref 136–145)

## 2016-08-31 LAB — GLUCOSE WHOLE BLOOD - POCT
Whole Blood Glucose POCT: 119 mg/dL — ABNORMAL HIGH (ref 70–100)
Whole Blood Glucose POCT: 236 mg/dL — ABNORMAL HIGH (ref 70–100)
Whole Blood Glucose POCT: 238 mg/dL — ABNORMAL HIGH (ref 70–100)
Whole Blood Glucose POCT: 374 mg/dL — ABNORMAL HIGH (ref 70–100)

## 2016-08-31 LAB — GFR: EGFR: 25.5

## 2016-08-31 NOTE — Plan of Care (Signed)
Patient remain alert and oriented x three with period of forgetfulness. Medicated as ordered and tolerated. Denied pain or any discomfort. ROM was done,tolerated to all extremities. Complain of discomfort to the right under breast and medicated as ordered for pain. It was helpful. Under breast area dressing was cleaned and dressing was changed. Will continue to monitor.

## 2016-08-31 NOTE — Rehab Progress Note ([facility]) (Signed)
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    OBJECTIVE  Vital Signs:                       Before Activity              After Activity  Vitals  Time                 1400                         1500  Position/Activity    -                            supine  BP Systolic          -                            157  BP Diastolic         -                            80  Pulse                -                            65  Please see EPIC for before activity BP, as O.T. took at end of previous session.    Interventions:       Therapeutic Activities:  Patient received sitting in w/c at nurses'  station, having finished O.T. session the previous hour.  Patient practiced sit  to stand transfers from w/c to RW with SBA and verbal cuing [id number] hand  placement.  Gait training provided times 50 feet times 2 reps and 60 feet with  RW with SBA-intermittent CGA.  Dynamic standing balance and bilateral LE  strengthening training provided with patient practicing lateral stepping along  handrail times 15 feet in each direction with SBA and with patient standing with  unilateral UE support on HR while practicing lifting hand off of HR and  maintaining balance for 3-5 seconds at a time. Gait training also provided  up/down 8 steps with bilateral HR with CGA with patient encouraged to use  step-to rather than reciprocal LE pattern to increase safety and  promote  increased ease maintaining balance.  Patient returned to room, assisted with  toileting with SBA for transfer, independence pericare, but dependent assistance  provided for changing wet brief for a dry one. Patient returned to bed, left  with call button within reach and bed alarm set.  Patient was returned to or left in bed. Handoff to nurse/nursing Tax inspector completed.  Bed alarm in place and activated.  Oriented to call bell and placed within reach.  Personal items within reach.  Assistive devices positioned out of reach.  Bed placed in lowest position.    Education Provided:    Education Provided: Plan of care. Functional transfers. Gait.  Stair/curb/environmental barrier negotiation.       Audience: Patient.       Mode: Explanation.  Demonstration.   Practice.       Response: Applied knowledge.  Verbalized understanding.  Demonstrated skill.  Needs practice.  Needs reinforcement.    ASSESSMENT  Patient is demonstrating improved gait pattern this session as compared to last  week, with more symmetrical movement of bilateral LE and improved step length  and heel strike with right LE noted.  Patient's posture is also noted to be more  upright today than previously with patient demonstrating increased independence  monitoring and adjusting her posture throughout gait training.  Patient needs  continued standing balance training to improve safety and gait training to  increase endurance with ambulation, as patient reports fatiguing quickly.    Functional Measures      TRANSFERS BED/CHAIR/WHEELCHAIR: Bed/chair/wheelchair Transfer Score = 5.  Patient is supervision/set-up for transferring to and from the  bed/chair/wheelchair, requiring: Stand by assistance.  Set up (positioning equipment, lock brakes and/or adjust foot rest).  Patient requires the following assistive device(s): Arm rest.    LOCOMOTION WHEELCHAIR:   Activity was not assessed.    LOCOMOTION WALK:   Walk Distance Scale = 2.  Distance  walked is 50 -149 feet. Walk Score = 2.  Patient performs 75% or more of effort and requires minimal assistance.  Incidental assistance, contact guard or steadying was provided. Patient walked a  distance of 60 feet. Rolling walker.    LOCOMOTION STAIRS: Stairs Score = 2.  Incidental assistance with lifting or  lowering, contact guard or steadying was provided. Patient performs 75% or more  of effort and requires minimal contact assistance. Patient negotiated  8 stairs.    Patient requires the following assistive device(s):  Handrail(s).    PLAN  Continued Physical Therapy is recommended.  Recommended Frequency/Duration/Intensity: daily for 60 minute sessions 5-6  days/week until 09/04/2016.  Activities Contributing Toward Care Plan: transfer training, gait training, NMR  addressing standing balance and LE motor control, patient/family education,  equipment procurement prn, and d/c planning, all provided within an  interdisciplinary, team approach to care.    3 Hour Rule Minutes: 60 minutes of PT treatment this session count towards  intensity and duration of therapy requirement. Patient was seen for the full  scheduled time of PT treatment this session.  Therapy Mode Minutes: Individual: 60 minutes.    Signed by: Dorothey Baseman, PT, DPT 08/31/2016 3:00:00 PM

## 2016-08-31 NOTE — Progress Notes (Signed)
Infectious Diseases  Progress Note     Impression:   80 year old female with diabetes, obstructive uropathy, relieved by stent placement on August 05, 2016, with a negative urine culture at that time. She has no GU Sx but with significant pyuria and >100K MDR E coli on Cx she has been a candidate for Tx. I am concerned about the possibility that the stent is colonized with this MDR E. Coli. She remains afebrile, nontoxic, and she has a normal white count. The vast majority of these are sensitive to [facility] and as she has lower tract involvement. I d/w her Urologist, Apolinar Junes, at [facility] he has not established a long term plan for her stent.    Recommendations:   Continue Monurol every other day.  Optimally, she could stay on Monurol through an outpt stent change next Wed with Dr.Tescher and then have just on more dose of Monurol.  D/W DR. Timmie Foerster. Her last dose here should be on Friday which is the day of d/c. I would give the family a script for Monurol 3 g PO every 48 hours for 3 doses and ask them to call Dr. Alferd Patee ofice to schedule a visit early next week so they can be put on his schedule. I will send a copy of this note to his office.  Will screen UCx and f/u Thursday, call if questions.    Subjective:   No new ID problems noted    Review of Systems  Patient without vomiting, diarrhea, rash, cough, shortness of breath, abdominal or chest pain.      Objective:   BP 168/87   Pulse 65   Temp (!) 96.8 F (36 C) (Oral)   Resp 18   Ht 1.626 m (5\' 4" )   Wt 66.6 kg (146 lb 13.2 oz)   SpO2 96%   BMI 25.20 kg/m     Temp (24hrs), Avg:97.6 F (36.4 C), Min:96.8 F (36 C), Max:98.3 F (36.8 C)      General: awake, alert,  no acute distress.    Labs:    Recent Labs  Lab 08/31/16  0523 08/28/16  0435 08/27/16  0546   WBC 13.14* 10.78 13.19*   Hgb 10.3* 11.0* 11.0*   Hematocrit 31.9* 34.0* 33.2*   Platelets 369 292 260       Recent Labs  Lab 08/31/16  0523 08/28/16  0435 08/27/16  0546 08/26/16  0446    Sodium 137 138 136 138   Potassium 4.5 4.9 4.9 4.5   Chloride 105 102 102 102   CO2 22 24 24 27    BUN 29* 26* 29* 34*   Creatinine 1.9* 1.6* 1.7* 1.8*   Calcium 8.5 8.9 8.7 8.9   Albumin  --   --  2.7* 3.0*   Protein, Total  --   --  5.7* 6.1   Bilirubin, Total  --   --  0.4 0.4   Alkaline Phosphatase  --   --  78 82   ALT  --   --  16 17   AST (SGOT)  --   --  10 10   Glucose 110* 150* 215* 105*

## 2016-08-31 NOTE — Rehab PSY Consult ([facility]) (Addendum)
Corrected 08/31/2016 11:02:52 AM    NAME: Glenda Green  MRN: [id number]  Account: [id number]  Session Start: 08/31/2016 10:00:00 AM  Session Stop: 08/31/2016 11:00:00 AM    Psychology Services  Inpatient Rehabilitation Consultation    Rehab Diagnosis: L MCA stroke  Demographics:            Age: 80Y            Gender: Female    Past Medical History: Arthritis  Depression  Hyperlipidemia  Hypertension  Kidney disorder  Type 2 diabetes mellitus  Urinary tract infections    SURGICAL HISTORY :  CABG 2010  Cystoscopy; ureteral stent 08/05/2016  Metatarsal amputation: 2015; three toes  L TKA 2010    History of Present Illness: 80  year old female with PMH HTN, hyperlipidemia,  Type 2 DM, CKD, partial L foot amputation, CAD s/p CABG  admitted [facility] 08/08/16  after family found her in her room struggling to put on her shirt and unable to  speak. MRI brain showed  acute left MCA distribution CVA. She was outside window  for lytics. Plavix started. Cardiac workup found large RA occupying mass and  possible interatrial shunt. 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  On 9/29 underwent cardiac cath and R thoracotomy for excision of axial  septal mass and PFO closure. Left heart cath 08/17/16 showed  3/4 2010 bypass  grafts patent with chronic occlusion of RCA.            Date of Onset: 08/08/16            Date of Admission: 08/25/2016 7:16:00 PM    Medications and Allergies: Significant rehabilitation considerations:   See EPIC.  Rehabilitation Precautions/Restrictions:   Falls  Aspiration  DM  HTN  3 toes on left foot amputated    Premorbid Functional Level: Patient reported Patient reported she was modified  independent with a RW or SPC for mobility. Lives with one of her sons (in either  [place] or [place]); also 2 other sons and 9 grandchildren.  Understanding of Current Condition: Patient alert and oriented and understands  present medical status.  Patient/Caregiver Goals:  Patient's functional goals: "To  be able to walk  without any device".    Social History: Retired. Bachelors degree. 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 Widowed. 4 children, 9 grandchildren. Enjoys reading.    Behavioral Observations and Mental Status:  Pt seen for initial psychological  consultation to assess for adjustment to stroke, heart surgery, and rehab  hospitalization. H and P reviewed. Attended her rehab team conference last week;  ELOS 10/13. STaff expressed concern that she seems depressed and demotivated in  therapy. Pt resting in bed between therapies. Introduced myself and explained my  role. Reviewed events leading to current rehab admission. Reviewed other  relevant psychosocial history. Mental status: Awake, alert, oriented. Pleasant  and engageable. Speech normal in process and quality. Thoughts logical,  coherent, goal-directed. Affect appropriate to situation, congruent to thoughts,  normal in range and intensity. Mood dysphoric. Glenda Green reports a history of  depression which she dates to the unexpected death of her first child at age 47.  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 She  has been prescribed Cymbalta (60mg ) for many years. Glenda Green feels her  depression is at it's  baseline or perhaps a bit worse. She endorses feelings of  hopelessness, helplessness, and worthlessness though she clarifies these to  state "I feel that way but intellectually I know it's not true". She has  been  struggling with motivation in her therapies. "Sometimes I feel like what's the  point, I"m 80 years old".    Interdisciplinary Educational Needs and Learning Preferences:       Learning Preference: The patient's preferred learning method is:  Explanation.       Barriers to Learning: No barriers.       Learning Needs:  Coping strategies    Education Provided: Coping strategies .       Audience: Patient.        Mode: Explanation.       Response: Verbalized understanding.    Interventions:   NPSY HLTH ASSESS INI EA 15 MIN    ASSESSMENT  Impressions:  Glenda Green is having a hard time coping with her stroke and  heart surgery. She has a history of chronic depression for which she has been  prescribed Cymbalta (60mg ) for many years. She now has the additional likelihood  of some post-stroke and post- cardiac surgery depression superimposed. She does  think her mood will improve once she's out of the hospital which is a motivating  factor.  Potential to Benefit: Able to participate in an intensive inpatient  interdisciplinary rehabilitation program, Good family/social support, Living in  the community premorbidly  Barriers to Progress/Discharge: Functional status, Medical condition, Limited  motivation  Goals:  Time frame to achieve long term goal(s): 1 week       1. Pt will demonstrate coping skills for adjusting to medical situation and  rehab hospitalization as reflected in self motivation in her rehab program and  positive expectations for being able to increase levels of functional  independence    PLAN  Psychology services are recommended to address: Emotional support; Assistance  with coping skills  Recommendations:  Individual counseling as needed; Therapy cotreatments as  needed    Care Plan  Identified problems from team documentation:  Problem: Impaired Bladder Management  Bladder Mgmt: Primary Team Goal: Patient will be continent of bladder at least  75% of the time with timed tolieting/Active    Problem: Impaired Bowel Management  Bowel Mgmt: Primary Team Goal: Patient will be able sustain bowel continence  with timed tolieting at least 75% of the time/Active    Problem: Impaired Communication  Communication: Primary Team Goal: Caregiver will demonstrate appropriate cueing  (i.e. probing questions) to address word finding difficultly in order to  facilitate patient's communication of wants/needs in  community./    Problem: Impaired Mobility  Mobility: Primary Team Goal: Patient will transfer from sitting to standing and  ambulate 50 feet with LRAD with mod I, ambulate 150 feet with LRAD with  supervision, and climb 12 steps with unilateral HR with CGA so patient can  maintain modified independence on one level and access all levels of sons'  homes./Active    Problem: Impaired Nutrition / Hydration  Nutrition: Primary Team Goal: Maintain meal intake of 75% or more through LOS/    Problem: Impaired Pain Management  Pain Mgmt: Primary Team Goal: Patient will verbalized pain less than 2 out of 10  with the use of medication and positioning techniques.Marland KitchenJorje Guild    Problem: Impaired Self-care Mgmt/ADL/IADL  Self Care: Primary Team Goal: Ms. Alden will perform basic UB/LB dressing at  MOD I./Active    Problem: Safety Risk  and Restraint  Safety: Primary Team Goal: Patient will recognize limitations and call for  assistance before attempting mobility 100% of the time in order to prevent  fall./Active    Identified problems from this assessment:     Psychosocial : Pt will demonstrate coping skills for adjusting to medical  situation as reflected in maintaining positive expectations for being able to  continue to increase levels of functional independence    Discipline:  Neuropsychology/Psychology    Please review Integrated Patient View Care Plan Flowsheet for Team identified  Problems, Interventions, and Goals.    Signed by: Mordecai Rasmussen, Ph.D., LICENSED CLINICAL PSYCHOLOGIST 08/31/2016  11:00:00 AM

## 2016-08-31 NOTE — Progress Notes (Signed)
IR[facility] Physiatry Attending Face to Face Progress  Note  [X] Discussed with nurse  .[x] No new events    Subjective:  Feels well;  has no new complaints.    No headache. No chest pain. No shortness of breath. No constipation.  Objective:  Vitals: BP 139/68   Pulse 64   Temp 97.5 F (36.4 C) (Oral)   Resp 18   Ht 1.626 m (5\' 4" )   Wt 66.6 kg (146 lb 13.2 oz)   SpO2 95%   BMI 25.20 kg/m   Appears well.In no apparent distress.Resting comfortably   Awake ; normal mood and affect.Sclerae anicteric.   Moist mucous membranes.   No respiratory distress .      New labs   Results     Procedure Component Value Units Date/Time    Glucose Whole Blood - POCT [[id number]]  (Abnormal) Collected:  08/31/16 1644     Updated:  08/31/16 1656     POCT - Glucose Whole blood 238 (H) mg/dL     Glucose Whole Blood - POCT [[id number]]  (Abnormal) Collected:  08/31/16 1145     Updated:  08/31/16 1208     POCT - Glucose Whole blood 236 (H) mg/dL     GFR [[id number]] Collected:  08/31/16 0523     Updated:  08/31/16 0717     EGFR 25.5    Basic Metabolic Panel [[id number]]  (Abnormal) Collected:  08/31/16 0523    Specimen:  Blood Updated:  08/31/16 0717     Glucose 110 (H) mg/dL      BUN 29 (H) mg/dL      Creatinine 1.9 (H) mg/dL      Calcium 8.5 mg/dL      Sodium 093 mEq/L      Potassium 4.5 mEq/L      Chloride 105 mEq/L      CO2 22 mEq/L      Anion Gap 10.0    CBC without differential [[id number]]  (Abnormal) Collected:  08/31/16 0523    Specimen:  Blood from Blood Updated:  08/31/16 0639     WBC 13.14 (H) x10 3/uL      Hgb 10.3 (L) g/dL      Hematocrit 25.4 (L) %      Platelets 369 x10 3/uL      RBC 3.69 (L) x10 6/uL      MCV 86.4 fL      MCH 27.9 (L) pg      MCHC 32.3 g/dL      RDW 15 %      MPV 10.0 fL      Nucleated RBC 0.0 /100 WBC      Absolute NRBC 0.00 x10 3/uL     Glucose Whole Blood - POCT [[id number]]  (Abnormal) Collected:  08/31/16 0607     Updated:  08/31/16 0625     POCT - Glucose Whole blood 119 (H) mg/dL     Glucose Whole Blood -  POCT [[id number]]  (Abnormal) Collected:  08/30/16 2129     Updated:  08/30/16 2133     POCT - Glucose Whole blood 290 (H) mg/dL           Medications:     Current Facility-Administered Medications   Medication Dose Route Frequency   . amiodarone  200 mg Oral BID   . amLODIPine  2.5 mg Oral Daily   . aspirin EC  81 mg Oral Daily   . atorvastatin  20 mg Oral QHS   . clopidogrel  75 mg Oral Daily   . docusate sodium  100 mg Oral BID   . DULoxetine  60 mg Oral Daily   . fosfoMYCIN  3 g Oral Q48H   . heparin (porcine)  5,000 Units Subcutaneous Q12H [facility]   . insulin glargine  12 Units Subcutaneous QHS   . lactobacillus/streptococcus  1 capsule Oral Daily   . metoprolol  25 mg Oral Q12H SCH   . nystatin   Topical BID       Assessment/Plan: 80 y.o. female  with dysfunction of mobility/ ADL due to Acute ischemic left MCA stroke   UTI: cont Abx  Continue comprehensive intensive inpatient rehab program. Medically stable. Continue current management.   Hendricks Limes, DO  PM&R

## 2016-08-31 NOTE — Rehab Progress Note ([facility]) (Signed)
NAMEMAGNOLIA MATTILA  MRN: [id number]  Account: [id number]  Session Start: 08/31/2016 1:00:00 PM  Session Stop: 08/31/2016 2:00:00 PM    Occupational Therapy  Inpatient Rehabilitation Progress Note - Brief    Rehab Diagnosis: L MCA stroke  Demographics:            Age: 27Y            Gender: Female  Rehabilitation Precautions/Restrictions:   Falls  Aspiration  DM  HTN  3 toes on left foot amputated    SUBJECTIVE  Patient Report: "Ugh I'm just so lazy"  Pain: Patient currently without complaints of pain.    OBJECTIVE  Vital Signs:                       Before Activity              After Activity  Vitals  Time                 1310                         1355  Position/Activity    seated                       seated  BP Systolic          122                          133  BP Diastolic         60                           76  Pulse                62                           71    Interventions:       Therapeutic Activities:  Ms. Stoneberg initially seen for timed toileting.  She reports it wasn't long since her last attempt, but (+) bladder incontinence.  Unable to void on toilet. She requires Min A for clothing mgmt. Transitioned to  OT gym with focus on ADL transfers. She performed bed mobility in standard flat  bed with Supervision. She rehearsed shower stall transfer with RW x2 with Min A.  Due to pt's flat affect and decreased motivation, transitioned to TR room with  focus on leisure tasks. Pt participated in standing tolerance activities doing  jigsaw puzzle with classical music playing. Pt reports she enjoys this and  tolerates 3 mins standing x3 reps. She requires Moderate prompting to problem  solve 100 piece puzzle and reports interest in continuing this at a later time.  Rec therapist notified.  Patient was left seated in chair at nurse's station. Chair alarm in place and  activated.  Personal items within reach.  Pain Reassessment: Pain was not reassessed as no pain was reported.    Education  Provided:    Education Provided: Activities of daily living. Functional transfers. Bed  mobility.       Audience: Patient.       Mode: Explanation.  Demonstration.       Response: Verbalized understanding.  Demonstrated skill.  Needs practice.    ASSESSMENT  Ms. Mirsky demonstrates improving mobility today  at mostly SBA. Intermittent  CGA/Min A for balance and shower transfers. She continues to have decreased  activity tolerance, further impacted by pt's self reported laziness and poor  motivation. Family meeting scheduled for tomorrow regarding d/c plan. Continue  with POC.    Functional Measures      TRANSFER TOILET: Toilet Transfer Score = 5.  Patient is supervision/set-up for  transferring to and from the toilet/commode, requiring: Stand by assistance.  Patient requires the following assistive device(s):  Walker.    TOILETING: Toileting Score = 4.  Patient requires minimal assistance for  toileting, such as steadying for balance while cleansing or adjusting clothes.  Patient requires the following assistive device(s):  Adaptive device to maintain  balance.  Grab bar.    PLAN  Continued Occupational Therapy is recommended.  Recommended Frequency/Duration/Intensity: 60-120 mins/day, 5-6 days/wk, for 7-10  days from IE on 08/26/16  Continued Activities Contributing Toward Care Plan: ADL training, safety and  mobility training, AE education, DME education, dynamic balance training, CVA  education, caregiver training prn, d/c planning, plan of care    3 Hour Rule Minutes: 60 minutes of OT treatment this session count towards  intensity and duration of therapy requirement. Patient was seen for the full  scheduled time of OT treatment this session.  Therapy Mode Minutes: Individual: 60 minutes.    Signed by: Melinda Crutch, OTR/L 08/31/2016 2:00:00 PM

## 2016-08-31 NOTE — Rehab Progress Note ([facility]) (Signed)
[name]  MRN: [id number]  Account: [id number]  Session Start: 08/31/2016 9:00:00 AM  Session Stop: 08/31/2016 10:00:00 AM    Speech Language Pathology  Inpatient Rehabilitation Progress Note - Brief    Rehab Diagnosis: L MCA stroke  Demographics:            Age: 42Y            Gender: Female  Rehabilitation Precautions/Restrictions:   Falls  Aspiration  DM  HTN  3 toes on left foot amputated    SUBJECTIVE  Patient Report: "Do I have to go? I've been so tired."  Pain: Patient currently without complaints of pain.    OBJECTIVE    Interventions:       Speech Treatment: Intervention focused on reinforcing use of compensatory  word retrieval strategies and re-educating on anomia diagnosis. Pt providing  appropriate examples of instances in which strategies were helpful in retrieving  words (e.g., SLP supplied phonemic cue which resulted in accurate production of  grandson's name). Confrontation naming tasks using pictured stimuli resulted in  18/18 correct responses w/o cues. On structured task involving naming objects  from descriptions, she produced 14/15 correct answers w/o assistance. Min verbal  cue for utilizing synonym strategy helpful in eliciting targeted response. Pt  also performed task involving word associations (22 opportunities). She  demonstrated approx. 80% acc. given mod pompting ("What does it belong to") and  verbal cues to avoid describing stimuli.  Patient was returned to or left in bed. Bed alarm in place and activated.  Oriented to call bell and placed within reach.  Personal items within reach.  Pain Reassessment: Pain was not reassessed as no pain was reported.    Education Provided:    Education Provided: Plan of care. Communication strategies.       Audience: Patient.       Mode: Explanation.  Demonstration.  Teacher, English as a foreign language provided.       Response: Verbalized understanding.  Demonstrated skill.  Needs practice.  Needs reinforcement.    ASSESSMENT  Pt exhibiting fewer instances  of word finding difficulty in conversation though  continues to need moderate support when participating in structured generative  naming tasks. Struggle noted in adjusting to association strategy vs  description. Remaining stimuli on worksheet assigned for homework and will  review in next session.    Functional Measures  Patient was not assessed.    PLAN  Continued Speech Language Pathology is recommended to address:  Recommended Frequency/Duration/Intensity: 60 min/day, 5-6 days/week for 7-10  days from 08/26/2016, 1:1 and group tx as appropriate  Continued Activities Contributing Toward Care Plan: Cognitive-linguistic  assessment and treatment planning, dysphagia assessment, Patient/family  education, Plan of care coordination with care team, Discharge planning    3 Hour Rule Minutes: 60 minutes of SLP treatment this session count towards  intensity and duration of therapy requirement. Patient was seen for the full  scheduled time of SLP treatment this session.  Therapy Mode Minutes: Individual: 60 minutes.    Signed by: Trenda Moots, M.A., CCC/SLP 08/31/2016 10:00:00 AM

## 2016-09-01 ENCOUNTER — Other Ambulatory Visit: Payer: Self-pay

## 2016-09-01 LAB — GLUCOSE WHOLE BLOOD - POCT
Whole Blood Glucose POCT: 153 mg/dL — ABNORMAL HIGH (ref 70–100)
Whole Blood Glucose POCT: 163 mg/dL — ABNORMAL HIGH (ref 70–100)
Whole Blood Glucose POCT: 227 mg/dL — ABNORMAL HIGH (ref 70–100)
Whole Blood Glucose POCT: 418 mg/dL — ABNORMAL HIGH (ref 70–100)
Whole Blood Glucose POCT: 216 mg/dL — ABNORMAL HIGH (ref 70–100)

## 2016-09-01 MED ORDER — FOSFOMYCIN TROMETHAMINE 3 G PO PACK
PACK | ORAL | 0 refills | Status: DC
Start: 2016-09-01 — End: 2016-09-03
  Filled 2016-09-01: qty 3, 6d supply, fill #0

## 2016-09-01 NOTE — Progress Notes (Signed)
[facility] Physiatry Attending Face to Face Progress Note    Date Time: 09/01/16 1:22 PM  Patient Name: Glenda Green, Glenda Green    Admission date:  08/25/2016    Functional Status/Update:     [x] I I reviewed patient's therapy notes to assess functional status and ongoing need for therapies.   Of note:  sit  to stand transfers from w/c to RW with SBA and verbal cuing EP:PIRJJOA hand  placement.  Gait training provided times 50 feet times 2 reps and 60 feet with  RW with SBA-intermittent CGA.  Dynamic standing balance and bilateral LE  strengthening training provided with patient practicing lateral stepping along  handrail times 15 feet in each direction with SBA and with patient standing with  unilateral UE support on HR while practicing lifting hand off of HR and  maintaining balance for 3-5 seconds at Green time. Gait training also provided  up/down 8 steps with bilateral HR with CGA       Patient's progress was discussed at length with the family and  interdisciplinary team during our family conference today.    Subjective and interval development:   Feels well, has no new complaints    Review of Systems:   Green comprehensive review of systems was: No fevers, chills, nausea, vomiting, chest pain, shortness of breath, cough, headache, double vision    Objective:   Vitals:  Vitals:    08/31/16 1648 08/31/16 2144 09/01/16 0628 09/01/16 1014   BP: 139/68 158/72 157/75 144/78   Pulse: 64 67 60 (!) 57   Resp: 18  18    Temp: 97.5 F (36.4 C)  (!) 95.7 F (35.4 C)    TempSrc: Oral  Oral    SpO2: 95%  98%    Weight:       Height:         Physical Examination:  General appearance: .  In no acute distress. Normal body habitus.  CZ:YSAYT rate and rhythm are regular. No significant lower limb edema.  Pulm: CTANo wheezes, rales, or rhonchi.  KZS:WFUX. Non-tender. Normoactive bowel sounds.  Skin: Right lateral thoracic Incision, resolving minimal erythema and slough  Neuro: Alert, oriented to name, location, time and circumstances.  Word finding  deficits.  Hearing intact bilaterally to conversational speech.   .Sensation to LT is intact  Musculoskeletal:   ROM of the right shoulder is limited by pain         Manual muscle strength testing:  Muscle group Left Right   Shoulder abductors 5 Invalid due to pain   Elbow flexors 5 3   Elbow extensors 5 4   Finger flexors 5 4   Hip flexors 4 4   Knee flexors 5 4   Knee extensors 5 4   Ankle dorsiflexors 5 5   Ankle plantarflexors 5 5   EHL 5 5     Extremities: Left foot: Amputation of the 1-3 toes    New Labs:   No results for input(s): GLUCOSEWHOLE in the last 24 hours.      Recent Labs  Lab 08/31/16  0523 08/28/16  0435   WBC 13.14* 10.78   Hgb 10.3* 11.0*   Hematocrit 31.9* 34.0*   Platelets 369 292          Recent Labs  Lab 08/31/16  0523 08/28/16  0435 08/27/16  0546 08/26/16  0446   Sodium 137 138 136 138   Potassium 4.5 4.9 4.9 4.5   Chloride 105 102 102 102  CO2 22 24 24 27    BUN 29* 26* 29* 34*   Creatinine 1.9* 1.6* 1.7* 1.8*   Calcium 8.5 8.9 8.7 8.9   Albumin  --   --  2.7* 3.0*   Protein, Total  --   --  5.7* 6.1   Bilirubin, Total  --   --  0.4 0.4   Alkaline Phosphatase  --   --  78 82   ALT  --   --  16 17   AST (SGOT)  --   --  10 10   Glucose 110* 150* 215* 105*     Urine analysis: Pyuria  Urine culture: Gram-negative rods more than 100,000 colonies           Rads:   Venous ultrasound, 08/26/16-no deep vein thrombosis in bilateral lower extremities  Rads:   Radiological Procedure reviewed.  Radiology Results (24 Hour)     ** No results found for the last 24 hours. **            Current medications:     Scheduled Meds: PRN Meds:        amiodarone 200 mg Oral BID   amLODIPine 2.5 mg Oral Daily   aspirin EC 81 mg Oral Daily   atorvastatin 20 mg Oral QHS   clopidogrel 75 mg Oral Daily   docusate sodium 100 mg Oral BID   DULoxetine 60 mg Oral Daily   fosfoMYCIN 3 g Oral Q48H   heparin (porcine) 5,000 Units Subcutaneous Q12H SCH   insulin glargine 12 Units Subcutaneous QHS   metoprolol 25 mg Oral  Q12H SCH   nystatin  Topical BID       Continuous Infusions:      acetaminophen 650 mg Q4H PRN   benzocaine-menthol 1 lozenge Q2H PRN   dextrose 15 g of glucose PRN   And     dextrose 125 mL PRN   And     glucagon (rDNA) 1 mg PRN   hydrALAZINE 25 mg Q6H PRN   insulin regular 1-4 Units QHS PRN   insulin regular 1-8 Units TID AC PRN   ondansetron 4 mg Q6H PRN   polyethylene glycol 17 g QD PRN   senna 17.2 mg QHS PRN   traMADol 50 mg Q6H PRN         Assessment:   80 y.o. female with  dysfunction of mobility/ ADL due to  left MCA stroke.  Cardiac debility.  Impaired balance  Partial amputation of the left foot   Urinary tract infection  Plan:  REHAB: Continue comprehensive and intensive inpatient rehab program, including  Physical therapy 60-120 min daily, 5-6 times per week   Occupational therapy 60-120 min daily, 5-6 times per week   Speech therapy 60-120 min daily, 5-6 times per week   Case management   Rehabilitation nursing    Will continue to address the following impairments and issues:   mobility/ADL dysfunction due to left MCA stroke: PT, OT, speech therapy.  Secondary stroke prevention: Aspirin, Plavix  Cardiac debility, status post right thoracotomy for myxoma excision, and PFO closure: We will continue amiodarone until 09/05/16; daily dressing change  Gram-negative urinary tract infection:  Cont Oral antibiotics and probiotic; discussed with Dr. Rebecca Eaton, infectious disease; the patient will probably need stent to be replaced      Hypertension: Controlled, systolic blood pressure-140s-150s; continue Norvasc, metoprolol.  Impaired balance: Balance training  Partial amputation of the left foot  Pain : Tylenol  DVT ppx:  Heparin    Coordination of care:  Care coordinated with  Rehab nurse and patient's family        Face to face discussion with the patient , family members and coordination of care  took more than  35 minutes; more than 50% of time was spent on coordination of care with family members   interdisciplinary team including physical therapist, occupational therapist, SLP, and case manager       Discharge planning: Home, 09/04/16    Signed by: Earl Many DO     [facility]

## 2016-09-01 NOTE — Rehab Progress Note ([facility]) (Addendum)
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  Patient/Caregiver Goals:  "To be able to walk without any device".  Pain: Patient currently without complaints of pain.    OBJECTIVE    Functional Measures      COMPREHENSION: Auditory comprehension is the usual mode. Patient does not  comprehend complex/abstract information in their primary language without  assistance from a helper. Comprehension Score = 5, Supervision. Patient  comprehends basic daily needs or ideas greater than 90% of the time. Patient  requires stand by/rare prompting.  Patient has the following assistive device(s) or limitations:    EXPRESSION: Vocal expression is the usual mode. Patient does not express  complex/abstract information in their primary language without a helper.  Expression Score = 5, Stand By Prompting. Patient expresses basic daily needs or  ideas without prompting.  Patient has the following assistive device(s) or limitations:    SOCIAL INTERACTION: Social Interaction Score = 6, Modified Independent.  Patient  is modified independent for social interaction, requiring:    PROBLEM SOLVING: Patient does not make appropriate decisions in order to solve  complex problems without assistance from a helper. Problem Solving Score = 4,  Minimal Direction. Patient makes appropriate decisions in order to solve routine  problems 75-90% of the time. Patient requires minimal/occasional direction for  the following  behavior(s):    MEMORY: Memory Score = 4, Minimal Prompting. Patient recognizes and remembers  75-90% of the time. Patient requires minimal/occasional prompting for memory for  the following:    Interventions:       Speech Treatment: -Administration of select subtests from Assessment of  Language Related Functional Activities (ALFA): 10/10 Understanding Medicine  Labels and 6/10 Counting Money. Pt requiring extra time for completion of money  tasks and repetition of instructions d/t suspected deficits in working memory.    Engineer, petroleum of compensatory word finding strategies using game play ("Taboo"): pt  utilizing description, association, and synonym strategies to assist w/ SLP  producing targeted response 3/4 trials; she benefited from min-mod prompting to  increase usage during moments of breakdown.  Patient was returned to or left in bed. Bed alarm in place and activated.  Oriented to call bell and placed within reach.  Personal items within reach.  Pain Reassessment: Pain was not reassessed as no pain was reported.  Equipment Provided: None at this time.    Education Provided:    Education Provided: Plan of care. Communication strategies. Cognitive  functioning.       Audience: Patient.       Mode: Explanation.  Demonstration.       Response: Verbalized understanding.  Demonstrated skill.  Needs practice.  Needs reinforcement.    ASSESSMENT  Results of formal testing of functional tasks revealed high probability of  independent functioning w/ medication management and need for some level of  assistance w/ basic money tasks. Per caregiver interview, it  was confimed the pt  required support in these areas prior to CVA. Pt exhibiting less anomic episodes  t/o sessions in past several days and benefits from min-mod cueing to implement  strategies (desription, synonyms, associations, etc). Plan to further assess  reading comprehension skills w/ more complex stimuli (e.g., newspaper) in next  sessions.    Long Term  Goals: Patient will implement word retrieval strategies (description,  categorization) in conversation given 1x probing question (e.g. what does it  look like) from caregiver in order to overcome at least 75% of anomic events in  5 min conversation.  Patient will read newspaper article using strategies to maximize comprehension  (read out loud, follow with finger, cover sheet) with min A from  caregiver/clinician in order to answer 4/5 comprehension questions accurately.  7-10 days from initial evalution (08/26/2016)  Short Term Goals:    Progress Toward Goals:     LONG TERM GOAL REVIEW:       1. Patient will implement word retrieval strategies (description,  categorization) in conversation given 1x probing question (e.g. what does it  look like) from caregiver in order to overcome at least 75% of anomic events in  5 min conversation. - Met       2. Patient will read newspaper article using strategies to maximize  comprehension (read out loud, follow with finger, cover sheet) with min A from  caregiver/clinician in order to answer 4/5 comprehension questions accurately. -  Not Met: additional testing indicated  Time frame to achieve long term goal(s):  7-10 days from initial evalution  (08/26/2016)    Updated FIM Goals:    PLAN  Continued Speech Language Pathology is recommended to address:  Recommended Frequency/Duration/Intensity: 60 min/day, 5-6 days/week for 7-10  days from 08/26/2016, 1:1 and group tx as appropriate  Continued Activities Contributing Toward Care Plan: Cognitive-linguistic  assessment and treatment planning, dysphagia assessment, Patient/family  education, Plan of care coordination with care team, Discharge planning      Care Plan  Identified problems from team documentation:  Problem: Impaired Bladder Management  Bladder Mgmt: Primary Team Goal: Patient will be continent of bladder at least  75% of the time with timed tolieting/Active    Problem: Impaired Bowel Management  Bowel Mgmt: Primary Team  Goal: Patient will be able sustain bowel continence  with timed toileting at least 75% of the time/Active    Problem: Impaired Communication  Communication: Primary Team Goal: Caregiver will demonstrate appropriate cueing  (i.e. probing questions) to address word finding difficutly in order to  facilitate patient's communicaiton of wants/needs in community./    Problem: Impaired Mobility  Mobility: Primary Team Goal: Patient will transfer from sitting to standing and  ambulate 50 feet with LRAD with mod I, ambulate 150 feet with LRAD with  supervision, and climb 12 steps with unilateral HR with CGA so patient can  maintain modified independence on one level and access all levels of sons'  homes./Active    Problem: Impaired Nutrition / Hydration  Nutrition: Primary Team Goal: Maintain meal intake of 75% or more through  LOS/Active    Problem: Impaired Pain Management  Pain Mgmt: Primary Team Goal: Patient will verbalized pain less than 2 out of 10  with the use of medication and positioning techniques.Marland KitchenJorje Guild    Problem: Impaired Psychosocial Skills/Behavior  PsychoSocial: Primary Team Goal: Pt will demonstrate coping skills for adjusting  to medical situation as reflected in maintaining positive expectations for being  able to continue to increase levels  of functional independence/Active    Problem: Impaired Self-care Mgmt/ADL/IADL  Self Care: Primary Team Goal: Ms. Culverhouse will perform basic UB/LB dressing at  MOD I./Active    Problem: Safety Risk and Restraint  Safety: Primary Team Goal: Patient will recognize limitations and call for  assistance before attempting mobility 100% of the time in order to prevent  fall./Active    Add/Update Problems from this Treatment:  Update of existing problem:   Communication: family training indicated to initiate use of appropriate cues  for word finding deficits; pt slowly beginning to utilize strategies more  independently    Discipline:  Speech/Language Pathology    Please  review Integrated Patient View Care Plan Flowsheet for Team identified  Problems, Interventions, and Goals.    3 Hour Rule Minutes: 60 minutes of SLP treatment this session count towards  intensity and duration of therapy requirement. Patient was seen for the full  scheduled time of SLP treatment this session.  Therapy Mode Minutes: Individual: 60 minutes.    Signed by: Trenda Moots, M.A., CCC/SLP 09/01/2016 10:00:00 AM

## 2016-09-01 NOTE — Rehab Progress Note ([facility]) (Signed)
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    OBJECTIVE  Vital Signs:                       Before Activity              After Activity  Vitals  Time                 refer to epic for details    -  Due to the pt. being on isolation precautions.    Interventions:       Therapeutic Activities:  The pt. was greeted sitting up in her w/c ready  for therapy. The PT assisted donning the pt's sneakers. The PT encouraged the  pt. to use the restroom prior to start of session due to the nature of the pt's  incontinence.The pt. reported she just went and didn't have to go. The PT  brought the pt. to time void, she had an incontinent bout of bladder. RN was  notified. The pt. required CLS SPV for stand step t/f's to and from the toilet  using the bathroom grab bar and assist with donning/doffing brief while  steadying herself at the grab bar. The pt. practiced stand step t/f's in the gym  with the RW requiring CLS SPV for safety, cues for hand placement as she would  grab for the RW and push up with both hands on the walker. The pt. practiced  standing ther ex at the RW to improve her tolerance to standing, endurance,  balance and overall mm. strength. The pt. practiced 2 sets of 20 marches, heel  raises, and mini squats x 20 reps (seated ther rest breaks due to dec.  endurance). The pt. practiced gait training with the RW 65' x 1 and 45' x 1 with  CLS SPV for safety with her balance, she required  maximal assist for navigating  back to her room on 5B 2/2 memory deficits. The pt. was repositioned supine in  bed with all needs met, handoff to RN.  Patient was returned to or left in bed. Handoff to nurse/nursing Tax inspector completed.  Bed alarm in place and activated.  Oriented to call bell and placed within reach.  Personal items within reach.  Assistive devices positioned out of reach.  Bed placed in lowest position.  Pain Reassessment: Pain was not reassessed as no pain was reported.    Education Provided:    Education Provided: Functional transfers. Safety. Gait.       Audience: Patient.       Mode: Explanation.  Demonstration.       Response: Verbalized understanding.  Needs practice.  Needs reinforcement.    ASSESSMENT  The pt. practiced standing ther ex at the RW to improve her overall tolerance to  standing, she required seated rest breaks approximately  every 2-3 minutes due to  overall fatigue. She demonstrates decreased ability to complete heel raises on  her R LE with dec. quad control in standing although no noted R knee buckling  with activity or with gait. The pt. self reported that her memory has not been  very good and the PT noticed the pt. was unable to recall where she was in  [place], unable to recall what room she was in and inc. difficulty navigating  back to her room.    Functional Measures      TRANSFERS BED/CHAIR/WHEELCHAIR: Bed/chair/wheelchair Transfer Score = 5.  Patient is supervision/set-up for transferring to and from the  bed/chair/wheelchair, requiring: Stand by assistance.  Verbal cuing, prompting, or instructing.  Patient requires the following assistive device(s): Walker.    LOCOMOTION WHEELCHAIR:   Wheelchair did not occur.    LOCOMOTION WALK:   Walk Distance Scale = 2.  Distance walked is 50 -149 feet. Walk Score = 2.  Patient is able to walk at least 50 feet (household distance) but requires  assistance. Patient walked a distance of 65 feet. Rolling  walker.    PLAN  Continued Physical Therapy is recommended.  Recommended Frequency/Duration/Intensity: daily for 60 minute sessions 5-6  days/week until 09/04/2016.  Activities Contributing Toward Care Plan: transfer training, gait training, NMR  addressing standing balance and LE motor control, patient/family education,  equipment procurement prn, and d/c planning, all provided within an  interdisciplinary, team approach to care.    3 Hour Rule Minutes: 60 minutes of PT treatment this session count towards  intensity and duration of therapy requirement. Patient was seen for the full  scheduled time of PT treatment this session.  Therapy Mode Minutes: Individual: 60 minutes.    Signed by: Loann Quill, PT 09/01/2016 11:00:00 AM

## 2016-09-01 NOTE — Rehab Progress Note ([facility]) (Addendum)
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  Pain: Patient currently has pain.  Location: head  Type: Acute  Quality: Aching.  Pain Scale: Numeric.  Patient reports a pain level of 4 out of 10.  Patient's acceptable level of pain 0 out of 10.   Interferes with sleep.  Pain is alleviated by: pain medication  Pain is exacerbated by: Patient medicated.  Pain Reassessment: Pain was not reassessed as no pain was reported.  Patient/Caregiver Goals:  "To be able to walk without any device".    NEURO  Orientation/Awareness: Alert and Oriented x3.  Speech: No deficits noted at this time.  Behavior: Cooperative.    MEDICATIONS  IV Access: No IV access.  Dialysis Access: Patient does not have dialysis access.      Elopement Risk Level Assessment Tool  Patient Criteria: Patient is not capable of leaving the unit.  Assessment is not  applicable.      RISK ASSESSMENT FOR FALLS/INJURY    MENTAL STATUS CRITERIA:   0 - None identified.  MENTAL STATUS TOTAL: 0    AGE CRITERIA:   78 - 78 years old and above  AGE TOTAL: 2    ELIMINATION CRITERIA:   3 - Toileting with Assistance.   5 - Incontinence.   ELIMINATION TOTAL: 8    HISTORY OF FALLS CRITERIA:   2 - Unknown History.  HISTORY OF FALLS TOTAL: 2    MEDICATIONS CRITERIA:   2 or more High Risk Medications (*see list below)   MEDICATIONS TOTAL: 2    PHYSICAL MOBILITY CRITERIA:   3  - Decreased balance reaction.   1 - Weakness/impaired physical mobility.   PHYSICAL MOBILITY TOTAL: 4    FALLS RISK ASSESSMENT TOTAL: 18    Patient's Fall Risk: TOTAL SCORE >10: High Risk    Falls Interventions: Yellow fall risk marker applied to patient identification  band  Oriented to call bell and placed within reach.  Personal items within reach.  Hourly toileting/safety checks between 6am and 10pm, then every 2 hours.  Bed alarm in place and activated.  Clutter removed and clear path to BR.  Assistive devices positioned out of reach.  Reoriented PRN.    NUTRITION  Diet:  Type: Consistent carbohydrate.  Food Consistency: Regular.  Liquid Consistency: Thin.      CARDIOVASCULAR     Bilateral lower extremities  Nail Bed Color: Pink.   No edema or redness present.  Pulses:   Left Brachial Pulse: Regular. Strong. Rate is 64 .   Patient does not have a pacemaker.   Patient does not have a defibrillator.    CARDIOPULMONARY  Lung Sounds:   Upper lobes. Clear.   Lower lobes. Clear.  Type of Respirations: Regular.  Cough: No cough noted.  Respiratory Support: The patient does not require any respiratory support.  Respiratory Equipment: None. 95%    INTEGUMENTARY  Skin:  Temperature: Warm  Turgor: Normal for age  Moisture: Dry  Color of skin: Normal for Race/Ethnicity  Capillary Refill: Less than 3 seconds  Wounds/Incisions:       Redness to groin area due to incontinent.        All dressing C/D/I.  Braden Scale for Predicting Pressure Sore Risk: Sensory Perception: Slightly  limited  Moisture: Occasionally moist  Activity: Walks occasionally  Mobility: Slightly limited  Nutrition: Adequate  Friction and Shear: Potential problem  Braden Score: 17  Level of Risk: At risk (15-18)   Turn patient every 2 hours   Assist patient to the bathroom every 2 hours   Incontinent Protocol    GASTROINTESTINAL  Abdomen: Soft. Nontender.  Bowel Sounds:  Active bowel sounds audible in all four quadrants.  Date of Last Bowel Movement:   10/09  Current Bowel Pattern:  Incontinent   No Problems/Complaints with Bowel Elimination Assessed.    GENITOURINARY  Current Bladder Pattern: Incontinent  Color:  Yellow   Patient denies problems with urination and/or catheter.    Bowel and Bladder Output:                       Bladder (# only)             Bowel (# only)  Number of Episodes  Continent            1                            0  Incontinent          1                            1    MUSCULOSKELETAL  Upper Body: Right hand swollen and weak  Lower Body: right leg weakness    Functional Measures       Bladder Score = 1. Patient performs less than 25% of tasks and requires total  assistance for bladder management. Helper provides total assist to completely  apply and remove brief.    BLADDER ACCIDENTS THIS SHIFT:  0 . Patient has not had an accident but used an  adult brief this assessment.     Bowel Score = 1. Patient performs less than 25% of tasks and requires total  assistance for bowel management. Helper provides total assist to completely  apply and remove brief OR clean up soiled linen/clothing/floor.    BOWEL ACCIDENTS THIS SHIFT: 0 . Patient has not had an accident, but used an  adult brief.    Education Provided:    Education Provided: Pain management. Medication options. Side effects. Clinical  indicators of pain. Safety issues and interventions. Fall protocol. Supervision  requirements. Use of adaptive devices. Medication. Administration. Purpose. Side  Effects. Interaction.       Audience: Patient.       Mode: Explanation.  Demonstration.       Response: Needs reinforcement.    Discharge: Patient is not being discharged at this time.    Long Term Goals: 1. Patient will be continent with bladder 100% most of the  time.   2. Patient will remain continent of bowel until discharge.   3. Patient will recognize limitations and call for assistance before  attempting  mobilty 100% of the time in order to prevent falls.   4. Surgical wound will be  clean with no drainage to minimize risk of infection.   5. Patient will verbalized pain level 2/10 after giving medication and  repositioning techniques.  2 weeks after 08/25/2016  Short Term Goals: 1. Patient will call for assistance 75% of the time before  attempting transfer to prevent falls during hospitalization. - New Goal   2. Patient will verbalize pain  less than 3 out of 10 with or without  medication.   3. Patient identifies correct drug indications, dosage, administration times  and side effects 75% on the medications.  1 week after 08/25/2016    PROGRESS TOWARD GOALS: Patient incontinent to bowel and bladder.    PLAN: Nursing Specific Interventions  Bladder Management. Bowel Management. Medication Management. Skin Management.  Wound Management.  Continue with the current Nursing Plan of Care.    TEAM CARE PLAN  Identified problems from team documentation:  Problem: Impaired Bladder Management  Bladder Mgmt: Primary Team Goal: Patient will be continent of bladder at least  75% of the time with timed tolieting/Active    Problem: Impaired Bowel Management  Bowel Mgmt: Primary Team Goal: Patient will be able sustain bowel continence  with timed toileting at least 75% of the time/Active    Problem: Impaired Communication  Communication: Primary Team Goal: Caregiver will demonstrate appropriate cueing  (i.e. probing questions) to address word finding difficutly in order to  facilitate patient's communicaiton of wants/needs in community./    Problem: Impaired Mobility  Mobility: Primary Team Goal: Patient will transfer from sitting to standing and  ambulate 50 feet with LRAD with mod I, ambulate 150 feet with LRAD with  supervision, and climb 12 steps with unilateral HR with CGA so patient can  maintain modified independence on one level and access all levels of sons'  homes./Active    Problem: Impaired Nutrition / Hydration  Nutrition: Primary Team Goal: Maintain meal intake of 75% or more through  LOS/    Problem: Impaired Pain Management  Pain Mgmt: Primary Team Goal: Patient will verbalized pain less than 2 out of 10  with the use of medication and positioning techniques.Marland KitchenJorje Guild    Problem: Impaired Psychosocial Skills/Behavior  PsychoSocial: Primary Team Goal: Pt will demonstrate coping skills for adjusting  to medical situation as reflected in maintaining positive expectations for being  able to continue to increase levels of functional independence/Active    Problem: Impaired Self-care Mgmt/ADL/IADL  Self Care: Primary Team Goal: Ms. Pietila will perform basic UB/LB dressing at  MOD I./Active    Problem: Safety Risk and Restraint  Safety: Primary Team Goal: Patient will recognize limitations and call for  assistance before attempting mobility 100% of the time in order to prevent  fall./Active    Add/Update Problems from this assessment:  No updates at this time.    Please review Integrated Patient View Care Plan Flowsheet for Team identified  Problems, Interventions, and Goals.    Signed by: Dariya Gainer, RN 08/31/2016 11:00:00 PM

## 2016-09-01 NOTE — Rehab Progress Note ([facility]) (Signed)
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    OBJECTIVE  Vital Signs:      Interventions:       Therapeutic Activities:  Per pt.'s request, pt. engaged in static standing  activity to build up tolerance and endurance in prep for greater independence  with ADL routine. Pt. completed 5 minutes of standing x 2 trials while  completing 100 piece puzzle. Pt. requiring mod verbal prompting for problem  solving and organization of task. Pt. completed timed toileting at the end of  the session. Pt. incontinent of urine. Pt. demonstrated ability to doff pants  and don new brief, pants, socks and shoes with supervision. Pt. completed toilet  transfer and hand washing standing at the sink with CG assist. Pt. transitioned  back into supine with supervision.  Patient was returned to or left in bed. Handoff to nurse/nursing Tax inspector completed.  Bed alarm in place and activated.  Oriented to call bell and placed within reach.  Personal items within reach.    ASSESSMENT  Pt. continues to present with decreased strength, decreased endurance and  impaired cognition impacting overall safety and independence with ADL routine  and functional transfers. Pt. would benefit from continued and skilled OT  services in order to maximize independence with ADL routine.    PLAN  Continued Occupational Therapy is recommended.  Recommended Frequency/Duration/Intensity: 60-120 mins/day, 5-6 days/wk, for 7-10  days from IE on 08/26/16  Continued Activities  Contributing Toward Care Plan: ADL training, safety and  mobility training, AE education, DME education, dynamic balance training, CVA  education, caregiver training prn, d/c planning, plan of care    3 Hour Rule Minutes: 60 minutes of OT treatment this session count towards  intensity and duration of therapy requirement. Patient was seen for the full  scheduled time of OT treatment this session.  Therapy Mode Minutes: Individual: 60 minutes.    Signed by: Ballard Russell, OT 09/01/2016 3:00:00 PM

## 2016-09-01 NOTE — Rehab Progress Note ([facility]) (Signed)
NAMESHELENA Green  MRN: [id number]  Account: [id number]  Session Start: 09/01/2016 12:00:00 AM  Session Stop: 09/01/2016 12:00:00 AM    Rehabilitation Nursing  Inpatient Rehabilitation Shift Assessment    Rehab Diagnosis: L MCA stroke  Demographics:            Age: 80Y            Gender: Female  Primary Language: English    Date of Onset:  08/08/16  Date of Admission: 08/25/2016 7:16:00 PM    Rehabilitation Precautions Restrictions:   Falls  Aspiration  DM  HTN  3 toes on left foot amputated    Patient Report: "I am good"  Pain: Patient currently without complaints of pain.  Pain Reassessment: Pain was not reassessed as no pain was reported.  Patient/Caregiver Goals:  "To be able to walk without any device".    NEURO  Orientation/Awareness: Alert and Oriented x3.  Speech: No deficits noted at this time.  Behavior: Cooperative.    MEDICATIONS  IV Access: No IV access.  Dialysis Access: Patient does not have dialysis access.      Elopement Risk Level Assessment Tool  Patient Criteria: Patient is not capable of leaving the unit.  Assessment is not  applicable.      RISK ASSESSMENT FOR FALLS/INJURY    MENTAL STATUS CRITERIA:   0 - None identified.  MENTAL STATUS TOTAL: 0    AGE CRITERIA:   2 - 24 years old and above  AGE TOTAL: 2    ELIMINATION CRITERIA:   3 - Toileting with Assistance.   5 - Incontinence.   ELIMINATION TOTAL: 8    HISTORY OF FALLS CRITERIA:   2 - Unknown History.  HISTORY OF FALLS TOTAL: 2    MEDICATIONS CRITERIA:   2 or more High Risk Medications (*see list below)   MEDICATIONS TOTAL: 2    PHYSICAL MOBILITY CRITERIA:   3 - Decreased balance reaction.   1 - Weakness/impaired physical mobility.   PHYSICAL MOBILITY TOTAL: 4    FALLS RISK ASSESSMENT TOTAL: 18    Patient's Fall Risk: TOTAL SCORE >10: High Risk    Falls Interventions: Oriented to call bell and placed within reach.  Personal items within reach.  Hourly toileting/safety checks between 6am and 10pm, then every 2 hours.  Initiate fall care  plan and outcome.  Clutter removed and clear path to BR.  Assistive devices positioned out of reach.  Reoriented PRN.    NUTRITION  Diet:  Type: Consistent carbohydrate.  Food Consistency: Regular.  Liquid Consistency: Thin.      CARDIOVASCULAR     Bilateral lower extremities  Nail Bed Color: Pink.   No edema or redness present.  Pulses:   Left Radial Pulse: Regular. Strong. Rate is 70 .   Patient does not have a pacemaker.   Patient does not have a defibrillator.    CARDIOPULMONARY  Lung Sounds:   Upper lobes. Clear.   Lower lobes. Clear.  Type of Respirations: Regular.  Cough: No cough noted.  Respiratory Support: The patient does not require any respiratory support.  Respiratory Equipment: None. 97% RA    INTEGUMENTARY  Skin:  Temperature: Warm  Turgor: Normal for age  Moisture: Dry  Color of skin: Normal for Race/Ethnicity  Capillary Refill: Less than 3 seconds  Wounds/Incisions:       All incison dressing C/D/A       Sacrum reddness d/t moisture/incontinent episode  Braden Scale for Predicting Pressure Sore Risk:  Sensory Perception: Slightly  limited  Moisture: Occasionally moist  Activity: Chairfast  Mobility: Slightly limited  Nutrition: Adequate  Friction and Shear: Potential problem  Braden Score: 16  Level of Risk: At risk (15-18)   Pressure Relief every 30 minutes while in wheelchair   Turn patient every 2 hours   Assist patient to the bathroom every 2 hours   Incontinent Protocol    GASTROINTESTINAL  Abdomen: Soft. Nontender.  Bowel Sounds:  Active bowel sounds audible in all four quadrants.  Date of Last Bowel Movement:  09/01/16  Current Bowel Pattern:  Incontinent   No Problems/Complaints with Bowel Elimination Assessed.    GENITOURINARY  Current Bladder Pattern: Incontinent  Color:  Yellow   Patient denies problems with urination and/or catheter.    Bowel and Bladder Output:                       Bladder (# only)             Bowel (# only)  Number of Episodes  Continent            1                             0  Incontinent          2                            1    MUSCULOSKELETAL  Upper Body: Right hand swollen and weak  Lower Body: right leg weakness    Functional Measures       Bladder Score = 3. Patient performs 50-74% of tasks and requires moderate  assistance for bladder management. Helper provides assist to position the bucket  and holds it in place less than 50% of the time.   There was a bladder accident. Clothing was soiled.    BLADDER ACCIDENTS THIS SHIFT:  1 .     Bowel Score = 3. Patient performs 50-74% of tasks and requires moderate  assistance for bowel management. Helper provides assist to position the bucket  and holds it in place less than 50% of the time.   There was a bowel accident. Clothing was soiled.    BOWEL ACCIDENTS THIS SHIFT: 1 .    Education Provided:    Education Provided: Pain management. Pain scale. Medication options. Side  effects.       Audience: Patient.       Mode: Explanation.       Response: Verbalized understanding.    Discharge: Patient is not being discharged at this time.    Long Term Goals: 1. Patient will be continent with bladder 100% most of the  time.   2. Patient will remain continent of bowel until discharge.   3. Patient will recognize limitations and call for assistance before attempting  mobilty 100% of the time in order to prevent falls.   4. Surgical wound will be clean with no drainage to minimize risk of infection.   5. Patient will verbalized pain level 2/10 after giving medication and  repositioning techniques.  2 weeks after 08/25/2016  Short Term Goals: 1. Patient will call for assistance 75% of the time before  attempting transfer to prevent falls during hospitalization. - New Goal   2. Patient will verbalize pain  less than 3 out of 10 with or without  medication.   3. Patient identifies correct drug indications, dosage, administration times  and side effects 75% on the medications.  1 week after 08/25/2016    PROGRESS TOWARD GOALS: SHORT TERM GOAL  REVIEW:       1. Patient will call for assistance 75% of the time before attempting  transfer to prevent falls during hospitalization. - Met       New Goal: Patient will call for assistance 75% of the time before  attempting transfer to prevent falls during hospitalization.  Time frame to achieve short term goal(s):  1 week after 08/25/2016   LONG TERM GOAL REVIEW:       1. Patient will be continent with bladder 100% most of the time. - Met       New Goal: Patient will be continent with bladder 100% most of the time.  Timeframe to achieve long term goal(s):  2 weeks after 08/25/2016    PLAN: Nursing Specific Interventions  Bladder Management. Bowel Management. Medication Management. Skin Management.  Wound Management.  Continue with the current Nursing Plan of Care.    TEAM CARE PLAN  Identified problems from team documentation:  Problem: Impaired Bladder Management  Bladder Mgmt: Primary Team Goal: Patient will be continent of bladder at least  75% of the time with timed tolieting/Active    Problem: Impaired Bowel Management  Bowel Mgmt: Primary Team Goal: Patient will be able sustain bowel continence  with timed toileting at least 75% of the time/Active    Problem: Impaired Communication  Communication: Primary Team Goal: Caregiver will demonstrate appropriate cueing  (i.e. probing questions) to address word finding difficutly in order to  facilitate patient's communicaiton of wants/needs in community./    Problem: Impaired Mobility  Mobility: Primary Team Goal: Patient will transfer from sitting to standing and  ambulate 50 feet with LRAD with mod I, ambulate 150 feet with LRAD with  supervision, and climb 12 steps with unilateral HR with CGA so patient can  maintain modified independence on one level and access all levels of sons'  homes./Active    Problem: Impaired Nutrition / Hydration  Nutrition: Primary Team Goal: Maintain meal intake of 75% or more through LOS/    Problem: Impaired Pain Management  Pain  Mgmt: Primary Team Goal: Patient will verbalized pain less than 2 out of 10  with the use of medication and positioning techniques.Marland KitchenJorje Guild    Problem: Impaired Psychosocial Skills/Behavior  PsychoSocial: Primary Team Goal: Pt will demonstrate coping skills for adjusting  to medical situation as reflected in maintaining positive expectations for being  able to continue to increase levels of functional independence/Active    Problem: Impaired Self-care Mgmt/ADL/IADL  Self Care: Primary Team Goal: Ms. Zarling will perform basic UB/LB dressing at  MOD I./Active    Problem: Safety Risk and Restraint  Safety: Primary Team Goal: Patient will recognize limitations and call for  assistance before attempting mobility 100% of the time in order to prevent  fall./Active    Add/Update Problems from this assessment:  No updates at this time.    Please review Integrated Patient View Care Plan Flowsheet for Team identified  Problems, Interventions, and Goals.    Signed by: Zorita Pang, RN 09/01/2016 2:52:00 PM

## 2016-09-01 NOTE — Rehab Progress Note ([facility]) (Signed)
NAMELATRESHIA BEAUCHAINE  MRN: [id number]  Account: [id number]  Session Start: 09/01/2016 12:00:00 AM  Session Stop: 09/01/2016 12:00:00 AM    Case Management  Inpatient Rehabilitation Family Conference Note    Rehab Diagnosis: L MCA stroke  Demographics:            Age: 34Y            Gender: Female    Individuals Present at Conference                       Team Member Present  Discipline  Doctor               Timmie Foerster  Nurse                Tarek  Case Manager         Mardella Layman  PT                   Toni Amend  OT                   notes sent by Morrie Sheldon  SLP                  [place]  Psych/Neuropsych     Zachery Conch    Family members and significant others:  Patient and son Glenda Green present. Patient's  daughter in law Glenda Green present by phone (contacted on work phone [phone] x  [phone]).    Issues Discussed During Conference: Family meeting held 10/10 at 1300 as  scheduled, with patient and family present as indicated above. Patient care team  provided verbal education. Per nursing report, repeat urine cx drawn today with  result pending. MD confirms patient continues on oral ABX and will complete an  additional week of ABX post discharge. Labs stable. Patient to continue all  medication as prescribed. Per PT: patient mobility improved. Sessions continue  to focus on endurance for ambulation. Stair training continues. Per OT: patient  able to manage clothing with minimal assist today. Would recommend SBA for  bathing/grooming. Per SLP: patient practiced basic medicationi management with  good progress today. Compensatory strategies taught for difficulty word finding,  with noted improvement during rehab stay. Per neuropsych, patient with long  history of depression, depression appears at baseline.    Patient care team recommends patient discharge to home with home health  services. Home health liaison previously alerted to referral. Would recommend 24  hour supervision to ensure patient safety, patient family agreeable to  provide  and exploring options for home health aide. Confirmed preference of  patient/family for patient discharge directly to home in [place]. Though care team  did discuss recommendation for follow up with Urology (possible stent  replacement) prior to return to [facility]. Patient's daughter in law agreeable to  contact Urology for outpatient appointment, hopefully on day of discharge. [facility]  Friday 10/13. Patient/family agreeable. CM will continue to follow.    Signed by: Arlie Solomons, MSW 09/01/2016 4:55:00 PM

## 2016-09-01 NOTE — Progress Notes (Signed)
Blood sugar at HS 374. Given ss coverage. Dr Ellen Henri informed. Made no new orders.

## 2016-09-02 ENCOUNTER — Other Ambulatory Visit: Payer: Self-pay

## 2016-09-02 LAB — GFR: EGFR: 29

## 2016-09-02 LAB — CBC
Absolute NRBC: 0 10*3/uL
Hematocrit: 34.1 % — ABNORMAL LOW (ref 37.0–47.0)
Hgb: 10.9 g/dL — ABNORMAL LOW (ref 12.0–16.0)
MCH: 28.3 pg (ref 28.0–32.0)
MCHC: 32 g/dL (ref 32.0–36.0)
MCV: 88.6 fL (ref 80.0–100.0)
MPV: 9.5 fL (ref 9.4–12.3)
Nucleated RBC: 0 /100 WBC (ref 0.0–1.0)
Platelets: 377 10*3/uL (ref 140–400)
RBC: 3.85 10*6/uL — ABNORMAL LOW (ref 4.20–5.40)
RDW: 15 % (ref 12–15)
WBC: 14.87 10*3/uL — ABNORMAL HIGH (ref 3.50–10.80)

## 2016-09-02 LAB — BASIC METABOLIC PANEL
Anion Gap: 7 (ref 5.0–15.0)
BUN: 25 mg/dL — ABNORMAL HIGH (ref 7–19)
CO2: 24 mEq/L (ref 22–29)
Calcium: 9 mg/dL (ref 7.9–10.2)
Chloride: 105 mEq/L (ref 100–111)
Creatinine: 1.7 mg/dL — ABNORMAL HIGH (ref 0.6–1.0)
Glucose: 114 mg/dL — ABNORMAL HIGH (ref 70–100)
Potassium: 5.4 mEq/L — ABNORMAL HIGH (ref 3.5–5.1)
Sodium: 136 mEq/L (ref 136–145)

## 2016-09-02 LAB — GLUCOSE WHOLE BLOOD - POCT
Whole Blood Glucose POCT: 106 mg/dL — ABNORMAL HIGH (ref 70–100)
Whole Blood Glucose POCT: 249 mg/dL — ABNORMAL HIGH (ref 70–100)
Whole Blood Glucose POCT: 264 mg/dL — ABNORMAL HIGH (ref 70–100)
Whole Blood Glucose POCT: 341 mg/dL — ABNORMAL HIGH (ref 70–100)

## 2016-09-02 MED ORDER — SODIUM POLYSTYRENE SULFONATE 15 GM/60ML PO SUSP
15.0000 g | Freq: Once | ORAL | Status: AC
Start: 2016-09-02 — End: 2016-09-02
  Administered 2016-09-02: 15 g via ORAL
  Filled 2016-09-02: qty 60

## 2016-09-02 NOTE — Rehab Progress Note ([facility]) (Signed)
NAMEARGELIA Green  MRN: [id number]  Account: [id number]  Session Start: 09/02/2016 12:00:00 AM  Session Stop: 09/02/2016 12:00:00 AM    Rehabilitation Nursing  Inpatient Rehabilitation Shift Assessment    Rehab Diagnosis: L MCA stroke  Demographics:            Age: 80Y            Gender: Female  Primary Language: English    Date of Onset:  08/08/16  Date of Admission: 08/25/2016 7:16:00 PM    Rehabilitation Precautions Restrictions:   Falls  Aspiration  DM  HTN  3 toes on left foot amputated    Patient Report: OK  Pain: Patient currently has pain.  Location: right breast wound  Type: Acute  Quality: Aching.  Pain Scale: Numeric.  Patient reports a pain level of 5 out of 10.  Patient's acceptable level of pain 0 out of 10.   Interferes with physical activity. sleep.  Pain is alleviated by: pain medication  Pain is exacerbated by: movement Patient medicated. trmadol 50 mg oral given  with good effect  Pain Reassessment: Pain was not reassessed as no pain was reported.  Patient/Caregiver Goals:  "To be able to walk without any device".    NEURO  Orientation/Awareness: Alert and Oriented x3.  Speech: Clear.  Behavior: Cooperative.    MEDICATIONS  IV Access: No IV access.  Dialysis Access: Patient does not have dialysis access.      Elopement Risk Level Assessment Tool  Patient Criteria: Patient is not capable of leaving the unit.  Assessment is not  applicable.      RISK ASSESSMENT FOR FALLS/INJURY    MENTAL STATUS CRITERIA:   0 - None identified.  MENTAL STATUS TOTAL: 0    AGE CRITERIA:   80 - 23 years old and above  AGE TOTAL: 2    ELIMINATION CRITERIA:   3 - Toileting with Assistance.   5 - Incontinence.   ELIMINATION TOTAL: 8    HISTORY OF FALLS CRITERIA:   2 - Unknown History.  HISTORY OF FALLS TOTAL: 2    MEDICATIONS CRITERIA:   2 or more High Risk Medications (*see list below)   MEDICATIONS TOTAL: 2    PHYSICAL MOBILITY CRITERIA:   3 - Decreased balance reaction.   1 - Weakness/impaired physical mobility.    PHYSICAL MOBILITY TOTAL: 4    FALLS RISK ASSESSMENT TOTAL: 18    Patient's Fall Risk: TOTAL SCORE >10: High Risk    Falls Interventions: Yellow fall risk marker applied to patient identification  band  Oriented to call bell and placed within reach.  Personal items within reach.  Hourly toileting/safety checks between 6am and 10pm, then every 2 hours.    NUTRITION  Diet:  Type: Consistent carbohydrate.  Food Consistency: Regular.  Liquid Consistency: Thin.      CARDIOVASCULAR     Bilateral lower extremities  Nail Bed Color: Pink.   No edema or redness present.  Pulses:   Apical Pulse: Regular. Strong. Rate is 68 .   Patient does not have a pacemaker.   Patient does not have a defibrillator.    CARDIOPULMONARY  Lung Sounds:   Upper lobes. Clear.   Lower lobes. Diminished at bases.  Type of Respirations: Regular.  Cough: No cough noted.  Respiratory Support: The patient does not require any respiratory support.  Respiratory Equipment: None.    INTEGUMENTARY  Skin:  Temperature: Warm  Turgor: Normal for age  Moisture: Dry  Color  of skin: Normal for Race/Ethnicity  Capillary Refill: Less than 3 seconds  Wounds/Incisions:       dressing to underneath right breast clean dry and intact  Braden Scale for Predicting Pressure Sore Risk: Sensory Perception: Slightly  limited  Moisture: Occasionally moist  Activity: Walks occasionally  Mobility: Slightly limited  Nutrition: Adequate  Friction and Shear: Potential problem  Braden Score: 17  Level of Risk: At risk (15-18)   Incontinent Protocol   Nutrition Consult    GASTROINTESTINAL  Abdomen: Soft.  Bowel Sounds:  Active bowel sounds audible in all four quadrants.  Date of Last Bowel Movement:  09/01/16  Current Bowel Pattern:  Continent   No Problems/Complaints with Bowel Elimination Assessed.    GENITOURINARY  Current Bladder Pattern: Incontinent  Color:  Yellow   Patient denies problems with urination and/or catheter.    Bowel and Bladder Output:                       Bladder  (# only)             Bowel (# only)  Number of Episodes  Continent            1                            0  Incontinent          1                            0    MUSCULOSKELETAL  Upper Body: Right hand swollen and weak  Lower Body: right leg weakness    Functional Measures       Bowel Score = 3. Patient performs 50-74% of tasks and requires moderate  assistance for bowel management. Helper provides some assist to apply OR remove  brief.    BOWEL ACCIDENTS THIS SHIFT: 0 . Patient has not had an accident and did not  require medications or devices.     Bladder Score = 3. Patient performs 50-74% of tasks and requires moderate  assistance for bladder management. Helper provides some assist to apply OR  remove brief.    BLADDER ACCIDENTS THIS SHIFT:  0 . Patient has not had an accident this  assessment and did not require medications or devices.    Education Provided:    Education Provided: Safety issues and interventions. Fall protocol.       Audience: Patient.       Mode: Explanation.       Response: Needs reinforcement.    Discharge: Patient is not being discharged at this time.    Long Term Goals: 1. Patient will be continent with bladder 100% most of the  time. - New Goal   2. Patient will remain continent of bowel until discharge.   3. Patient will recognize limitations and call for assistance before attempting  mobilty 100% of the time in order to prevent falls.   4. Surgical wound will be clean with no drainage to minimize risk of infection.   5. Patient will verbalized pain level 2/10 after giving medication and  repositioning techniques.  2 weeks after 08/25/2016  Short Term Goals: 1. Patient will call for assistance 75% of the time before  attempting transfer to prevent falls during hospitalization. - New Goal   2. Patient will verbalize pain  less than 3 out  of 10 with or without  medication.   3. Patient identifies correct drug indications, dosage, administration times  and side effects 75% on the  medications.  1 week after 08/25/2016    PROGRESS TOWARD GOALS: SHORT TERM GOAL REVIEW:       1. Patient will call for assistance 75% of the time before attempting  transfer to prevent falls during hospitalization. - Not Met:  Time frame to achieve short term goal(s):  1 week after 08/25/2016    PLAN: Nursing Specific Interventions  Bladder Management. Bowel Management. Medication Management. Skin Management.  Wound Management.  Continue with the current Nursing Plan of Care.    TEAM CARE PLAN  Identified problems from team documentation:  Problem: Impaired Bladder Management  Bladder Mgmt: Primary Team Goal: Patient will be continent of bladder at least  75% of the time with timed tolieting/Active    Problem: Impaired Bowel Management  Bowel Mgmt: Primary Team Goal: Patient will be able sustain bowel continence  with timed toileting at least 75% of the time/Active    Problem: Impaired Communication  Communication: Primary Team Goal: Caregiver will demonstrate appropriate cueing  (i.e. probing questions) to address word finding difficutly in order to  facilitate patient's communicaiton of wants/needs in community./Active    Problem: Impaired Mobility  Mobility: Primary Team Goal: Patient will transfer from sitting to standing and  ambulate 50 feet with LRAD with mod I, ambulate 150 feet with LRAD with  supervision, and climb 12 steps with unilateral HR with CGA so patient can  maintain modified independence on one level and access all levels of sons'  homes./Active    Problem: Impaired Nutrition / Hydration  Nutrition: Primary Team Goal: Maintain meal intake of 75% or more through  LOS/Active    Problem: Impaired Pain Management  Pain Mgmt: Primary Team Goal: Patient will verbalized pain less than 2 out of 10  with the use of medication and positioning techniques.Marland KitchenJorje Guild    Problem: Impaired Psychosocial Skills/Behavior  PsychoSocial: Primary Team Goal: Pt will demonstrate coping skills for adjusting  to medical  situation as reflected in maintaining positive expectations for being  able to continue to increase levels of functional independence/Active    Problem: Impaired Self-care Mgmt/ADL/IADL  Self Care: Primary Team Goal: Ms. Guerrieri will perform basic UB/LB dressing at  MOD I./Active    Problem: Safety Risk and Restraint  Safety: Primary Team Goal: Patient will recognize limitations and call for  assistance before attempting mobility 100% of the time in order to prevent  fall./Active    Add/Update Problems from this assessment:  No updates at this time.    Please review Integrated Patient View Care Plan Flowsheet for Team identified  Problems, Interventions, and Goals.    Signed by: Nelda Marseille, RN 09/02/2016 12:41:00 AM

## 2016-09-02 NOTE — Rehab Progress Note ([facility]) (Signed)
Glenda Green  MRN: [id number]  Account: [id number]  Session Start: 09/02/2016 9:00:00 AM  Session Stop: 09/02/2016 10:00:00 AM    Speech Language Pathology  Inpatient Rehabilitation Progress Note - Brief    Rehab Diagnosis: L MCA stroke  Demographics:            Age: 52Y            Gender: Female  Rehabilitation Precautions/Restrictions:   Falls  Aspiration  DM  HTN  3 toes on left foot amputated    SUBJECTIVE  Patient Report: "No, I'm OK," when politely declining coffee.  Pain: Patient currently without complaints of pain.    OBJECTIVE    Interventions:       Speech Treatment: Session focused on increasing reading comprehension of  complex material. Pt was provided w/ various reading material including national  magazines. She perused stimuli and located 2 articles of interest. SLP  introducing strategies to increase comprehension of written material including  cover sheet, reading aloud, and re-reading passage. Following completion of  first selection (4 paragraphs), she was able to answer questions w/ 88% accuracy  independently and improved performance w/ min prompting. On second trial, pt  demonstrated same accuracy w/ same number of paragraphs. Remainder of session  spent w/ additional training of compensatory word finding strategies using  gaming stimulus ("Taboo"). Pt able to describe, produce associations, and  generate synonyms while facilitating production of targeted response from SLP in  5 opportunities.  Patient was left seated in chair in his/her room. Oriented to call bell and  placed within reach.  Personal items within reach.  Pain Reassessment: Pain was not reassessed as no pain was reported.    Education Provided:    Education Provided: Plan of care. Communication strategies.       Audience: Patient.       Mode: Explanation.  Demonstration.       Response: Verbalized understanding.  Demonstrated skill.  Needs practice.    ASSESSMENT  Pt improving w/ reading comprehension skills and  noting preference for inclusion  of select strategy (re-reading passage) over others (cover sheet). She endorsed  feelings of markedly improved reduction in anomic episodes and required less  support in using word finding strategies w/ gaming task.    Functional Measures  Patient was not assessed.    PLAN  Continued Speech Language Pathology is recommended to address:  Recommended Frequency/Duration/Intensity: 60 min/day, 5-6 days/week for 7-10  days from 08/26/2016, 1:1 and group tx as appropriate  Continued Activities Contributing Toward Care Plan: Cognitive-linguistic  assessment and treatment planning, dysphagia assessment, Patient/family  education, Plan of care coordination with care team, Discharge planning    3 Hour Rule Minutes: 60 minutes of SLP treatment this session count towards  intensity and duration of therapy requirement. Patient was seen for the full  scheduled time of SLP treatment this session.  Therapy Mode Minutes: Individual: 60 minutes.    Signed by: Trenda Moots, M.A., CCC/SLP 09/02/2016 10:00:00 AM

## 2016-09-02 NOTE — Progress Notes (Signed)
Home Health Referral          Referral from [facility] (Case Manager) for home health care upon discharge.    By [employer], the patient has the right to freely choose a home care provider.  Arrangements have been made with:     A company of the patients choosing. We have supplied the patient with a listing of providers in your area who asked to be included and participate in Medicare.   [facility], a home care agency that provides both adult home care services which is a wholly owned and operated by [employer] and participates in [employer]   The preferred provider of your insurance company. Choosing a home care provider other than your insurance company's preferred provider may affect your insurance coverage.    The Home Health Care Referral Form acknowledging the voluntary selection of the home care company has been completed, signed, and is on file.      Home Health Discharge Information     Your doctor has ordered Skilled Nursing, Physical Therapy, Occupational Therapy and Speech and Language Therapy in-home service(s) for you while you recuperate at home, to assist you in the transition from hospital to home.      The agency that you or your representative chose to provide the service:  Name of Home Health Agency: Other (comment box) [facility] [phone])]       The above services were set up by:  Reola Mosher  [facility] Liaison)   Phone [phone]    Additional comments: Daughter, Dorene Grebe, has been made aware and [facility] agency has made contact to begin arranging services. Patient anticipated to discharge on 10/13.       Signed by: Reola Mosher  Date Time: 09/02/16 11:00 AM

## 2016-09-02 NOTE — Progress Notes (Signed)
[facility] Physiatry Attending Face to Face Progress Note    Date Time: 09/02/16 5:56 PM  Patient Name: Glenda Green, Glenda Green    Admission date:  08/25/2016    Functional Status/Update:     [x] I I reviewed patient's therapy notes to assess functional status and ongoing need for therapies.   Of note:  Practiced ambulating 1 x 100' with RW and close supervision. Performed standing  dynamic balance and strengthening exercises for improved ability to engage in  gait and stair negotiation. Pt. with poor endurance requiring seated rest breaks  every 2-3 min throughout    Patient' status and progress  discussed at length today with the interdisciplinary team during team conference ;report with detailed status, progress, and goals will be placed in [facility]il[facility]       Subjective and interval development:   Complains of fatigue, denies any pain    Review of Systems:   Green comprehensive review of systems was: No fevers, chills, nausea, vomiting, chest pain, shortness of breath, cough, headache, double vision    Objective:   Vitals:  Vitals:    09/02/16 0853 09/02/16 1408 09/02/16 1449 09/02/16 1703   BP: 118/66 142/73 120/73 137/74   Pulse: 70 72 72 64   Resp:    20   Temp:    (!) 96.7 F (35.9 C)   TempSrc:       SpO2:    98%   Weight:       Height:         Physical Examination:  General appearance: .  In no acute distress. Normal body habitus.  ZO:XWRUE rate and rhythm are regular. No significant lower limb edema.  Pulm: Clear to auscultation bilaterally.  No wheezes, rales, or rhonchi.  AVW:UJWJ. Non-tender. Normoactive bowel sounds.  Skin: Right lateral thoracic Incision, resolving minimal erythema, no drainage  Neuro: Alert, oriented to name, location, time and circumstances.    Hearing intact bilaterally to conversational speech.   .Sensation to LT is intact  Musculoskeletal:   ROM-within functional limits         Manual muscle strength testing:  Muscle group Left Right   Shoulder abductors 5 3-   Elbow flexors 5 3   Elbow extensors 5  4   Finger flexors 5 4   Hip flexors 4 4   Knee flexors 5 4   Knee extensors 5 4   Ankle dorsiflexors 5 5   Ankle plantarflexors 5 5   EHL 5 5     Extremities: Left foot: Amputation of the 1-3 toes    New Labs:   No results for input(s): GLUCOSEWHOLE in the last 24 hours.      Recent Labs  Lab 09/02/16  0546 08/31/16  0523   WBC 14.87* 13.14*   Hgb 10.9* 10.3*   Hematocrit 34.1* 31.9*   Platelets 377 369          Recent Labs  Lab 09/02/16  0546 08/31/16  0523  08/27/16  0546   Sodium 136 137 More results in Results Review 136   Potassium 5.4* 4.5 More results in Results Review 4.9   Chloride 105 105 More results in Results Review 102   CO2 24 22 More results in Results Review 24   BUN 25* 29* More results in Results Review 29*   Creatinine 1.7* 1.9* More results in Results Review 1.7*   Calcium 9.0 8.5 More results in Results Review 8.7   Albumin  --   --   --  2.7*   Protein, Total  --   --   --  5.7*   Bilirubin, Total  --   --   --  0.4   Alkaline Phosphatase  --   --   --  78   ALT  --   --   --  16   AST (SGOT)  --   --   --  10   Glucose 114* 110* More results in Results Review 215*   More results in Results Review = values in this interval not displayed.  Urine analysis: Pyuria  Urine culture: Gram-negative rods more than 100,000 colonies           Rads:   Venous ultrasound, 08/26/16-no deep vein thrombosis in bilateral lower extremities  Rads:   Radiological Procedure reviewed.  Radiology Results (24 Hour)     ** No results found for the last 24 hours. **            Current medications:     Scheduled Meds: PRN Meds:        amiodarone 200 mg Oral BID   amLODIPine 2.5 mg Oral Daily   aspirin EC 81 mg Oral Daily   atorvastatin 20 mg Oral QHS   clopidogrel 75 mg Oral Daily   docusate sodium 100 mg Oral BID   DULoxetine 60 mg Oral Daily   fosfoMYCIN 3 g Oral Q48H   heparin (porcine) 5,000 Units Subcutaneous Q12H SCH   insulin glargine 12 Units Subcutaneous QHS   metoprolol 25 mg Oral Q12H SCH   nystatin  Topical  BID   sodium polystyrene 15 g Oral Once       Continuous Infusions:      acetaminophen 650 mg Q4H PRN   benzocaine-menthol 1 lozenge Q2H PRN   dextrose 15 g of glucose PRN   And     dextrose 125 mL PRN   And     glucagon (rDNA) 1 mg PRN   hydrALAZINE 25 mg Q6H PRN   insulin regular 1-4 Units QHS PRN   insulin regular 1-8 Units TID AC PRN   ondansetron 4 mg Q6H PRN   polyethylene glycol 17 g QD PRN   senna 17.2 mg QHS PRN   traMADol 50 mg Q6H PRN         Assessment:   80 y.o. female with  dysfunction of mobility/ ADL due to  left MCA stroke.  Cardiac debility.  Impaired balance  Partial amputation of the left foot   Urinary tract infection  Plan:  REHAB: Continue comprehensive and intensive inpatient rehab program, including  Physical therapy 60-120 min daily, 5-6 times per week   Occupational therapy 60-120 min daily, 5-6 times per week   Speech therapy 60-120 min daily, 5-6 times per week   Case management   Rehabilitation nursing    Will continue to address the following impairments and issues:   mobility/ADL dysfunction due to left MCA stroke: PT, OT, speech therapy.  Hyperkalemia: Kayexalate once, check potassium level in Green.m.  Secondary stroke prevention: Aspirin, Plavix  Cardiac debility, status post right thoracotomy for myxoma excision, and PFO closure: continue amiodarone until 09/05/16; daily dressing change  Gram-negative urinary tract infection:  Cont Oral antibiotics and probiotic;  the patient will probably need stent to be replaced  Hypertension: Controlled, systolic blood pressure-[id number]; continue Norvasc, metoprolol.      Impaired balance: Balance training  Partial amputation of the left foot  Pain : Tylenol  DVT ppx: Heparin  Coordination of care:    Face to face discussion with the patient and coordination of care  took more than  35 minutes; more than 50% of time was spent on coordination of care with interdisciplinary team including physical therapist, occupational therapist, SLP,  recreational therapist ,rehabilitation nursing, dietician , neuropsychologist ,and case manager     Discharge planning: Home, 09/04/16    Signed by: Earl Many DO     [facility]

## 2016-09-02 NOTE — Rehab Progress Note (Medilinks) (Addendum)
Corrected 09/02/2016 12:06:13 PM    NAME: Glenda Green  MRN: [id number]  Account: [id number]  Session Start: 09/02/2016 12:00:00 AM  Session Stop: 09/02/2016 12:00:00 AM    Rehabilitation Nursing  Inpatient Rehabilitation Shift Assessment    Rehab Diagnosis: L MCA stroke  Demographics:            Age: 80Y            Gender: Female  Primary Language: English    Date of Onset:  08/08/16  Date of Admission: 08/25/2016 7:16:00 PM    Rehabilitation Precautions Restrictions:   Falls  Aspiration  DM  HTN  3 toes on left foot amputated    Patient Report: " I am doing just fine, can you help me with my pants and help  me readjusted please?"  Pain: Patient currently without complaints of pain.  Pain Reassessment: Pain was not reassessed as no pain was reported.  Patient/Caregiver Goals:  "To be able to walk without any device".    NEURO  Orientation/Awareness: Alert and Oriented x4.  Speech: No deficits noted at this time.  Behavior: Cooperative.    MEDICATIONS  IV Access: No IV access.  Dialysis Access: Patient does not have dialysis access.      Elopement Risk Level Assessment Tool  Patient Criteria: Patient is not capable of leaving the unit.  Assessment is not  applicable.      RISK ASSESSMENT FOR FALLS/INJURY    MENTAL STATUS CRITERIA:   0 - None identified.  MENTAL STATUS TOTAL: 0    AGE CRITERIA:   36 - 36 years old and above  AGE TOTAL: 2    ELIMINATION CRITERIA:   3 - Toileting with Assistance.   5 - Incontinence.   ELIMINATION TOTAL: 8    HISTORY OF FALLS CRITERIA:   2 - Unknown History.  HISTORY OF FALLS TOTAL: 2    MEDICATIONS CRITERIA:   2 or more High Risk Medications (*see list below)   MEDICATIONS TOTAL: 2    PHYSICAL MOBILITY CRITERIA:   3 - Decreased balance reaction.   1 - Weakness/impaired physical mobility.   PHYSICAL MOBILITY TOTAL: 4    FALLS RISK ASSESSMENT TOTAL: 18    Patient's Fall Risk: TOTAL SCORE >10: High Risk    Falls Interventions: Yellow fall risk marker applied to patient  identification  band  Oriented to call bell and placed within reach.  Personal items within reach.  Hourly toileting/safety checks between 6am and 10pm, then every 2 hours.  Bed alarm in place and activated.  Chair alarm in place and activated.  Initiate fall care plan and outcome.  Clutter removed and clear path to BR.  Assistive devices positioned out of reach.   hourly rounding    NUTRITION  Diet:  Type: Consistent carbohydrate.  Food Consistency: Regular.  Liquid Consistency: Thin.      CARDIOVASCULAR     Bilateral lower extremities  Nail Bed Color: Pink.   No edema or redness present.  Pulses:   Right Radial Pulse: Regular. Strong. Rate is 70 .   Patient does not have a pacemaker.   Patient does not have a defibrillator.    CARDIOPULMONARY  Lung Sounds:   Upper lobes. Clear.   Lower lobes. Clear.  Type of Respirations: Regular.  Cough: No cough noted.  Respiratory Support: The patient does not require any respiratory support.  Respiratory Equipment: None.    INTEGUMENTARY  Skin:  Temperature: Cool  Turgor: Normal for age  Moisture: Dry  Color of skin: Normal for Race/Ethnicity  Capillary Refill: Less than 3 seconds   Bruising: abdomen are due to lovenox shot  Wounds/Incisions:       Dressing to underneath right breast clean dry and intact, patient states it is  painful to touch.  Sacrum reddness d/t moisture/incontinent episode  Braden Scale for Predicting Pressure Sore Risk: Sensory Perception: No  impairment  Moisture: Occasionally moist  Activity: Chairfast  Mobility: Slightly limited  Nutrition: Adequate  Friction and Shear: Potential problem  Braden Score: 17  Level of Risk: At risk (15-18)   Turn patient every 2 hours   Assist patient to the bathroom every 2 hours   Incontinent Protocol    GASTROINTESTINAL  Abdomen: Soft. Nontender.  Bowel Sounds:  Active bowel sounds audible in all four quadrants.  Date of Last Bowel Movement:  09/01/16  Current Bowel Pattern:  Incontinent   No Problems/Complaints with  Bowel Elimination Assessed.    GENITOURINARY  Current Bladder Pattern: Incontinent  Color:  Yellow Yellow   Patient denies problems with urination and/or catheter.    Bowel and Bladder Output:                       Bladder (# only)             Bowel (# only)  Number of Episodes  Continent            2                            0  Incontinent          0                            0    MUSCULOSKELETAL  Upper Body: Right hand swollen and weak  Lower Body: right leg weakness    Functional Measures       Bladder Score = 3. Patient performs 50-74% of tasks and requires moderate  assistance for bladder management. Helper provides some assist to apply OR  remove brief.    BLADDER ACCIDENTS THIS SHIFT:  0 . Patient has not had an accident this  assessment and did not require medications or devices.     Bowel Score = 3. Patient performs 50-74% of tasks and requires moderate  assistance for bowel management. Helper provides some assist to apply OR remove  brief.    BOWEL ACCIDENTS THIS SHIFT: 0 . Patient has not had an accident and did not  require medications or devices.    Education Provided:    Education Provided: Precautions. Pain management. Pain scale. Safety issues and  interventions. Fall protocol. Supervision requirements. Skin/wound care.  Signs/symptoms of infection. Incision care. Prevention of skin breakdown.  Medication. Name and dosage. Administration. Purpose. Side Effects. Diabetes.  Medication. Glucose testing.       Audience: Patient.       Mode: Explanation.  Teacher, English as a foreign language provided.       Response: Verbalized understanding.  Needs practice.    Discharge: Patient is not being discharged at this time.    Long Term Goals: 1. Patient will be continent with bladder 100% most of the  time. - New Goal   2. Patient will remain continent of bowel until discharge.   3. Patient will recognize limitations and call for assistance before attempting  mobilty 100% of  the time in order to prevent falls.   4. Surgical  wound will be clean with no drainage to minimize risk of infection.   5. Patient will verbalized pain level 2/10 after giving medication and  repositioning techniques.  2 weeks after 08/25/2016  Short Term Goals: 1. Patient will call for assistance 75% of the time before  attempting transfer to prevent falls during hospitalization. - Goal Not Met   2. Patient will verbalize pain  less than 3 out of 10 with or without  medication.   3. Patient identifies correct drug indications, dosage, administration times  and side effects 75% on the medications.  1 week after 08/25/2016    PROGRESS TOWARD GOALS: Patient has been needing insulin coverage. Patient uses  the call bell 100% of the time when assistance is needed. Pain level has remaind  0/10 throughout the morning shift. Patient is on time toileting every 2 hours.    PLAN: Nursing Specific Interventions  Bladder Management. Bowel Management. Medication Management. Skin Management.  Wound Management.  Continue with the current Nursing Plan of Care.    TEAM CARE PLAN  Identified problems from team documentation:  Problem: Impaired Bladder Management  Bladder Mgmt: Primary Team Goal: Patient will be continent of bladder at least  75% of the time with timed tolieting/Active    Problem: Impaired Bowel Management  Bowel Mgmt: Primary Team Goal: Patient will be able sustain bowel continence  with timed toileting at least 75% of the time/Active    Problem: Impaired Communication  Communication: Primary Team Goal: Caregiver will demonstrate appropriate cueing  (i.e. probing questions) to address word finding difficutly in order to  facilitate patient's communicaiton of wants/needs in community./Active    Problem: Impaired Mobility  Mobility: Primary Team Goal: Patient will transfer from sitting to standing and  ambulate 50 feet with LRAD with mod I, ambulate 150 feet with LRAD with  supervision, and climb 12 steps with unilateral HR with CGA so patient can  maintain modified  independence on one level and access all levels of sons'  homes./Active    Problem: Impaired Nutrition / Hydration  Nutrition: Primary Team Goal: Maintain meal intake of 75% or more through  LOS/Active    Problem: Impaired Pain Management  Pain Mgmt: Primary Team Goal: Patient will verbalized pain less than 2 out of 10  with the use of medication and positioning techniques.Marland KitchenJorje Guild    Problem: Impaired Psychosocial Skills/Behavior  PsychoSocial: Primary Team Goal: Pt will demonstrate coping skills for adjusting  to medical situation as reflected in maintaining positive expectations for being  able to continue to increase levels of functional independence/Active    Problem: Impaired Self-care Mgmt/ADL/IADL  Self Care: Primary Team Goal: Ms. Cribb will perform basic UB/LB dressing at  MOD I./Active    Problem: Safety Risk and Restraint  Safety: Primary Team Goal: Patient will recognize limitations and call for  assistance before attempting mobility 100% of the time in order to prevent  fall./Active    Add/Update Problems from this assessment:  No updates at this time.    Please review Integrated Patient View Care Plan Flowsheet for Team identified  Problems, Interventions, and Goals.    Signed by: Rush Landmark, RN 09/02/2016 12:00:00 PM

## 2016-09-02 NOTE — Rehab Progress Note ([facility]ilinks) (Signed)
NAMECENA BRUHN  MRN: [id number]  Account: [id number]  Session Start: 09/02/2016 2:00:00 PM  Session Stop: 09/02/2016 3:00:00 PM    Physical Therapy  Inpatient Rehabilitation Progress Note - Brief    Rehab Diagnosis: L MCA stroke  Demographics:            Age: 25Y            Gender: Female  Rehabilitation Precautions/Restrictions:   Falls  Aspiration  DM  HTN  3 toes on left foot amputated    SUBJECTIVE    Pain: Patient currently without complaints of pain.    OBJECTIVE  Vital Signs:  See Epic    Interventions:       Therapeutic Activities: Pt. received reclined in bed with son present  agreeable to PT session. Observer present throughout session with pt. consent.  Practiced ambulating 1 x 100' with RW and close supervision. Performed standing  dynamic balance and strengthening exercises for improved ability to engage in  gait and stair negotiation. Pt. with poor endurance requiring seated rest breaks  every 2-3 min throughout. Discussed d/c plan providing education as needed.  Patient was returned to or left in bed. Handoff to nurse/nursing Tax inspector completed.  Bed alarm in place and activated.  Oriented to call bell and placed within reach.  Personal items within reach.  Assistive devices positioned out of reach.  Bed placed in lowest position.    ASSESSMENT  Pt. with significantly impaired endurance throughout session requiring frequent  rest breaks to continue. Pt. declined to practice stair negotiation at this  time.    PLAN  Continued Physical Therapy is recommended.  Recommended Frequency/Duration/Intensity: daily for 60 minute sessions 5-6  days/week until 09/04/2016.  Activities Contributing Toward Care Plan: transfer training, gait training, NMR  addressing standing balance and LE motor control, patient/family education,  equipment procurement prn, and d/c planning, all provided within an  interdisciplinary, team approach to care.    3 Hour Rule Minutes: 60 minutes of PT treatment this  session count towards  intensity and duration of therapy requirement. Patient was seen for the full  scheduled time of PT treatment this session.  Therapy Mode Minutes: Individual: 60 minutes.    Signed by: Ocie Cornfield, PT, DPT 09/02/2016 3:00:00 PM

## 2016-09-02 NOTE — Rehab Progress Note ([facility]) (Signed)
[name]  MRN: [id number]  Account: [id number]  Session Start: 09/02/2016 10:00:00 AM  Session Stop: 09/02/2016 11:00:00 AM    Occupational Therapy  Inpatient Rehabilitation Progress Note - Brief    Rehab Diagnosis: L MCA stroke  Demographics:            Age: 63Y            Gender: Female  Rehabilitation Precautions/Restrictions:   Falls  Aspiration  DM  HTN  3 toes on left foot amputated    SUBJECTIVE  Patient Report: "I'm leaving Friday and after the weekend going back to [place]"  Pain: Patient currently without complaints of pain.    OBJECTIVE  Vital Signs:                       Before Activity              After Activity  Vitals  Time                 1015                         -  Position/Activity    seated                       -  BP Systolic          155                          -  BP Diastolic         81                           -  Pulse                62                           -    Interventions:       Therapeutic Activities:  Pt seen initially for timed toileting. See below  for current LOF. She initially declines use of bathroom but reeducated on  maintaining skin integrity and checking for incontinence. Pt is incontinent of  bowel and bladder prior to sitting on toilet and pt surprised by this. Requires  assist to change brief and manage pants back up 2/2 retropulsion. Transitioned  to laundry task. Pt put two bags of laundry into washing machine at RW level and  SBA. VCs for novelty of equipment. Pt with (+) balance reaching toward floor  level items with SBA. Pt participated in standing tolerance tasks at puzzle. Pt  tolerates 4 mins at a time Returned to room and MD present for assessment.  Patient was left seated in chair in his/her room. Handoff to nurse/nursing  Cytogeneticist completed.  Chair alarm in place and activated.  Oriented to call bell and placed within reach.  Personal items within reach.  Assistive devices positioned out of reach.  Pain Reassessment:  Pain was not reassessed as no pain was reported.    Education Provided:    Education Provided: Activities of daily living. Functional transfers. Plan of  care.       Audience: Patient.       Mode: Explanation.  Demonstration.       Response: Verbalized understanding.  Demonstrated skill.  Needs practice.    ASSESSMENT  Pt demonstrates mildly improved activity tolerance today increasing standing  time to 4 mins and performing her own RW level laundry. She continues with flat  affect and decreased mood. Continue wtih POC to maximzie I prior to d/c Friday.    Functional Measures      TOILETING: Patient requires no physical assistance for adjusting clothing before  using a toilet, commode, bedpan, or urinal. Patient requires no physical  assistance for hygiene. Patient requires minimal assistance for adjusting  clothing after using a toilet, commode, bedpan, or urinal. Patient performs  66.67 % of toileting tasks. Toileting Score = 3, Moderate Assistance.  Patient requires the following assistive device(s):  Adaptive device to maintain  balance.  Grab bar.    TRANSFER TOILET: Toilet Transfer Score = 4.  Patient performs 75% or more of  effort and minimal assistance (little/incidental help/steadying) for  transferring to and from the toilet/commode, requiring: Steadying.  Patient requires the following assistive device(s):  Walker.    PLAN  Continued Occupational Therapy is recommended.  Recommended Frequency/Duration/Intensity: 60-120 mins/day, 5-6 days/wk, for 7-10  days from IE on 08/26/16  Continued Activities Contributing Toward Care Plan: ADL training, safety and  mobility training, AE education, DME education, dynamic balance training, CVA  education, caregiver training prn, d/c planning, plan of care    3 Hour Rule Minutes: 60 minutes of OT treatment this session count towards  intensity and duration of therapy requirement. Patient was seen for the full  scheduled time of OT treatment this session.  Therapy Mode  Minutes: Individual: 60 minutes.    Signed by: Melinda Crutch, OTR/L 09/02/2016 11:00:00 AM

## 2016-09-03 ENCOUNTER — Inpatient Hospital Stay: Payer: Medicare Other

## 2016-09-03 LAB — CBC AND DIFFERENTIAL
Absolute NRBC: 0 10*3/uL
Hematocrit: 32.8 % — ABNORMAL LOW (ref 37.0–47.0)
Hgb: 10.6 g/dL — ABNORMAL LOW (ref 12.0–16.0)
MCH: 28.6 pg (ref 28.0–32.0)
MCHC: 32.3 g/dL (ref 32.0–36.0)
MCV: 88.6 fL (ref 80.0–100.0)
MPV: 9.5 fL (ref 9.4–12.3)
Nucleated RBC: 0 /100 WBC (ref 0.0–1.0)
Platelets: 374 10*3/uL (ref 140–400)
RBC: 3.7 10*6/uL — ABNORMAL LOW (ref 4.20–5.40)
RDW: 15 % (ref 12–15)
WBC: 14.02 10*3/uL — ABNORMAL HIGH (ref 3.50–10.80)

## 2016-09-03 LAB — GLUCOSE WHOLE BLOOD - POCT
Whole Blood Glucose POCT: 117 mg/dL — ABNORMAL HIGH (ref 70–100)
Whole Blood Glucose POCT: 209 mg/dL — ABNORMAL HIGH (ref 70–100)
Whole Blood Glucose POCT: 238 mg/dL — ABNORMAL HIGH (ref 70–100)
Whole Blood Glucose POCT: 168 mg/dL — ABNORMAL HIGH (ref 70–100)

## 2016-09-03 LAB — MAN DIFF ONLY
Band Neutrophils Absolute: 1.26 10*3/uL — ABNORMAL HIGH (ref 0.00–1.00)
Band Neutrophils: 9 %
Basophils Absolute Manual: 0 10*3/uL (ref 0.00–0.20)
Basophils Manual: 0 %
Eosinophils Absolute Manual: 0.42 10*3/uL (ref 0.00–0.70)
Eosinophils Manual: 3 %
Lymphocytes Absolute Manual: 2.52 10*3/uL (ref 0.50–4.40)
Lymphocytes Manual: 18 %
Monocytes Absolute: 0.7 10*3/uL (ref 0.00–1.20)
Monocytes Manual: 5 %
Myelocytes Absolute: 1.26 10*3/uL — ABNORMAL HIGH
Myelocytes: 9 %
Neutrophils Absolute Manual: 7.85 10*3/uL (ref 1.80–8.10)
Segmented Neutrophils: 56 %

## 2016-09-03 LAB — CELL MORPHOLOGY
Cell Morphology: ABNORMAL — AB
Platelet Estimate: NORMAL

## 2016-09-03 LAB — POTASSIUM: Potassium: 5.4 mEq/L — ABNORMAL HIGH (ref 3.5–5.1)

## 2016-09-03 MED ORDER — DULOXETINE HCL 60 MG PO CPEP
60.0000 mg | ORAL_CAPSULE | Freq: Every day | ORAL | 0 refills | Status: AC
Start: 2016-09-04 — End: ?

## 2016-09-03 MED ORDER — ACETAMINOPHEN 325 MG PO TABS
650.0000 mg | ORAL_TABLET | ORAL | 0 refills | Status: AC | PRN
Start: 2016-09-03 — End: ?

## 2016-09-03 MED ORDER — SODIUM POLYSTYRENE SULFONATE 15 GM/60ML PO SUSP
15.0000 g | Freq: Once | ORAL | Status: AC
Start: 2016-09-03 — End: 2016-09-03
  Administered 2016-09-03: 15 g via ORAL
  Filled 2016-09-03: qty 60

## 2016-09-03 MED ORDER — SENNA 8.6 MG PO TABS
17.2000 mg | ORAL_TABLET | Freq: Every evening | ORAL | 0 refills | Status: AC | PRN
Start: 2016-09-03 — End: ?

## 2016-09-03 MED ORDER — INSULIN REGULAR HUMAN 100 UNIT/ML IJ SOLN
1.0000 [IU] | Freq: Every evening | INTRAMUSCULAR | 0 refills | Status: AC | PRN
Start: 2016-09-03 — End: ?

## 2016-09-03 MED ORDER — TRAMADOL HCL 50 MG PO TABS
50.0000 mg | ORAL_TABLET | Freq: Four times a day (QID) | ORAL | 0 refills | Status: DC | PRN
Start: 2016-09-03 — End: 2018-03-10

## 2016-09-03 MED ORDER — ASPIRIN EC 81 MG PO TBEC
81.0000 mg | DELAYED_RELEASE_TABLET | Freq: Every day | ORAL | 0 refills | Status: AC
Start: 2016-09-04 — End: ?

## 2016-09-03 MED ORDER — DSS 100 MG PO CAPS
100.0000 mg | ORAL_CAPSULE | Freq: Two times a day (BID) | ORAL | 0 refills | Status: AC
Start: 2016-09-03 — End: ?

## 2016-09-03 NOTE — Rehab Discharge Summary ([facility]) (Signed)
NAMEGENEVEIVE Glenda Glenda Green  MRN: [id number]  Account: [id number]  Session Start: 09/03/2016 9:00:00 AM  Session Stop: 09/03/2016 10:00:00 AM    Speech Language Pathology  Inpatient Rehabilitation  Language Cognitive and Dysphagia Discharge Summary and Note    Rehab Diagnosis: L MCA stroke  Demographics:            Age: 80Y            Gender: Female  Primary Language: English    Past Medical History: Arthritis  Depression  Hyperlipidemia  Hypertension  Kidney disorder  Type 2 diabetes mellitus  Urinary tract infections    SURGICAL HISTORY :  CABG 2010  Cystoscopy; ureteral stent 08/05/2016  Metatarsal amputation: 2015; three toes  L TKA 2010  History of Present Illness: 80  year old female with PMH HTN, hyperlipidemia,  Type 2 DM, CKD, partial L foot amputation, CAD s/p CABG  admitted [facility] 08/08/16  after family found her in her room struggling to put on her shirt and unable to  speak. MRI brain showed  acute left MCA distribution CVA. She was outside window  for lytics. Plavix started. Cardiac workup found large RA occupying mass and  possible interatrial shunt. 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  On 9/29 underwent cardiac cath and R thoracotomy for excision of axial  septal mass and PFO closure. Left heart cath 08/17/16 showed  3/4 2010 bypass  grafts patent with chronic occlusion of RCA.   Date of Onset: 08/08/16   Date of Admission: 08/25/2016 7:16:00 PM  Premorbid Functional Level: Per rehab liaison:  Mobility:   Mod I for mobility, uses FWW for community ambulation.  Activities of Daily Living:  Independent with ADL's  Cognition:  WNL  Communication:  WNL  Swallowing:  WNL  Social/Educational History: Retired. Bachelors degree. Worked previously as a  Sales executive. Widowed. 4 children, 9 grandchildren. Enjoys reading.  Home Environment: Patient is able to stay on one level of either of her son's  homes, with access to full bathroom, although they are both multi-level.  Although living with  her son previously, patient was functioning at a mod I  level, so caregiver availability will need to be determined.    Rehabilitation Precautions/Restrictions:   Falls  Aspiration  DM  HTN  3 toes on left foot amputated    SUBJECTIVE  Patient Report: "Feeling tired."  Pain: Patient currently without complaints of pain.      OBJECTIVE  Functional Measures      COMPREHENSION: Auditory comprehension is the usual mode. Patient does not  comprehend complex/abstract information in their primary language without  assistance from a helper. Comprehension Score = 5, Supervision. Patient  comprehends basic daily needs or ideas greater than 90% of the time. Patient  requires stand by/rare prompting.  Patient has the following assistive device(s) or limitations:    EXPRESSION: Vocal expression is the usual mode. Patient does not express  complex/abstract information in their primary language without a helper.  Expression Score = 5, Stand By Prompting. Patient expresses basic daily needs or  ideas without prompting.  Patient has the following assistive device(s) or limitations:    SOCIAL INTERACTION: Social Interaction Score = 6, Modified Independent.  Patient  is modified independent for social interaction, requiring: Requires additional  time.    PROBLEM SOLVING: Patient does not make appropriate decisions in order to solve  complex problems without assistance from a helper. Problem Solving Score = 4,  Minimal  Direction. Patient makes appropriate decisions in order to solve routine  problems 75-90% of the time. Patient requires minimal/occasional direction for  the following behavior(s):    MEMORY: Memory Score = 4, Minimal Prompting. Patient recognizes and remembers  75-90% of the time. Patient requires minimal/occasional prompting for memory for  the following:    Current Diet: Regular diet and thin liquids    Today's Treatment Interventions:       Speech Treatment: Nurse present for observation. SLP prompting pt to  recount  general information regarding hospital course and she accurately  produced diagnosis, upcoming d/c date, as well as specific purpose of SLP  intervention (training of word finding strategies). At least 2 such strategies  retrieved independently and session focused on establishing consistent use of  them in game context ("Taboo"). Pt able to assist SLP in achieving targeted  response 8X and SLP providing word clues for her to produce target 10X. Over  course of game play, pt more easily using synonyms, descriptions, antonyms, and  associations w/ min-mod level of prompting. Remainder of session spent reviewing  stroke education including types, risk factors, and preventative measures. SLP  encouraging use of highlighter technique when enhancing memory of key aspects.  Patient was left seated in chair in his/her room. Oriented to call bell and  placed within reach.  Personal items within reach.  Education Provided:    Education Provided: Plan of care. Communication strategies. Stroke. What is  stroke, Types of stroke, Stroke prevention, Medication compliance, Control blood  pressure, high cholesterol and/or diabetes, Healthy diet, Increase physical  activity       Audience: Patient.       Mode: Explanation.  Demonstration.  Stroke ed booklet.       Response: Verbalized understanding.  Demonstrated skill.  Needs practice.    ASSESSMENT  Summary of Progress and Current Status: Pt presents w/ mild anomia secondary to  L CVA. Intervention has focused on use of compensatory word retrieval  strategies, reading comprehension strategies, formal testing of higher level  cognitive skills in regards to medication management and finances, as well as  language skill re-development through various exercises. Pt demonstrating  notable reduction in anomic episodes since admission and becoming more adept at  repairing breakdowns w/ strategies given prompting. Comprehension of complex  reading stimuli maintained during session w/  introduction of techniques such as  cover sheet and re-reading passages. Per family interview, pt was receiving  assistance w/ meds and finances before injury onset. They are agreeable to  continuing this support upon d/c to community. Education provided on anomia,  characteristics of L CVA, stroke information, as well as word finding strategy  list. Continued skilled SLP services indicated upon return to community to  facilitate return to premorbid level of communication.    Progress Toward Goals (final status):     LONG TERM GOAL REVIEW:       1. Patient will implement word retrieval strategies (description,  categorization) in conversation given 1x probing question (e.g. what does it  look like) from caregiver in order to overcome at least 75% of anomic events in  5 min conversation. - Met       2. Patient will read newspaper article using strategies to maximize  comprehension (read out loud, follow with finger, cover sheet) with min A from  caregiver/clinician in order to answer 4/5 comprehension questions accurately. -  Met    PLAN  Speech Pathology Plan: Patient is recommended for home health therapy  services.    Team Care Plan  Please review Integrated Patient View Care Plan Flowsheet for Team identified  Problems, Interventions, and Goals.    Identified problems from team documentation:  Problem: Impaired Bladder Management  Bladder Mgmt: Primary Team Goal: Patient will be continent of bladder at least  75% of the time with timed tolieting/Active    Problem: Impaired Bowel Management  Bowel Mgmt: Primary Team Goal: Patient will be able sustain bowel continence  with timed toileting at least 75% of the time/Active    Problem: Impaired Communication  Communication: Primary Team Goal: Caregiver will demonstrate appropriate cueing  (i.e. probing questions) to address word finding difficutly in order to  facilitate patient's communicaiton of wants/needs in community./Active    Problem: Impaired  Mobility  Mobility: Primary Team Goal: Patient will transfer from sitting to standing and  ambulate 50 feet with LRAD with mod I, ambulate 150 feet with LRAD with  supervision, and climb 12 steps with unilateral HR with CGA so patient can  maintain modified independence on one level and access all levels of sons'  homes./Active    Problem: Impaired Nutrition / Hydration  Nutrition: Primary Team Goal: Maintain meal intake of 75% or more through  LOS/Active    Problem: Impaired Pain Management  Pain Mgmt: Primary Team Goal: Patient will verbalized pain less than 2 out of 10  with the use of medication and positioning techniques.Marland KitchenJorje Guild    Problem: Impaired Psychosocial Skills/Behavior  PsychoSocial: Primary Team Goal: Pt will demonstrate coping skills for adjusting  to medical situation as reflected in maintaining positive expectations for being  able to continue to increase levels of functional independence/Active    Problem: Impaired Self-care Mgmt/ADL/IADL  Self Care: Primary Team Goal: Ms. Rodak will perform basic UB/LB dressing at  MOD I./Not Met    Problem: Safety Risk and Restraint  Safety: Primary Team Goal: Patient will recognize limitations and call for  assistance before attempting mobility 100% of the time in order to prevent  fall./Active    Status update for discharge:     Communication:   Primary Goal: Met  Discharge Status Comment:   caregiver reporting present for at least 1 ST  session and learning use of phonemic strategies to assist word finding  breakdowns    3 Hour Rule Minutes: 60 minutes of SLP treatment this session count towards  intensity and duration of therapy requirement. Patient was seen for the full  scheduled time of SLP treatment this session.  Therapy Mode Minutes: Individual: 60 minutes.    Signed by: Trenda Moots, M.A., CCC/SLP 09/03/2016 10:00:00 AM

## 2016-09-03 NOTE — Rehab Progress Note ([facility]) (Signed)
NAMESHAVONA GUNDERMAN  MRN: [id number]  Account: [id number]  Session Start: 09/03/2016 2:00:00 PM  Session Stop: 09/03/2016 2:30:00 PM    Psychology Services  Inpatient Rehabilitation Progress Note    Rehab Diagnosis: L MCA stroke  Demographics:            Age: 89Y            Gender: Female    Medications and Allergies: Significant rehabilitation considerations:   See EPIC.  Rehabilitation Precautions/Restrictions:   Falls  Aspiration  DM  HTN  3 toes on left foot amputated    Patient Reports: Pt seen for final planned time prior to discharge scheduled for  tomorrow. Resting in bed between therapies. Mrs. Savoca has been an active and  approriate participant in her rehab program. Despite her underlying depression  she has been motivated in her rehab participation and is able to report  positively on areas of improvement. She will remain on her Cymbalta (60mg ) which  she has been taking for many years. Family is very supportive and encouraging. I  will discharge her from psychology services with her discharge from the hospital  tomorrow.  Patient/Caregiver Goals:  "To be able to walk without any device".    Interventions:   NPSY INDIVID HLTH INTERV    Signed by: Mordecai Rasmussen, Ph.D., LICENSED CLINICAL PSYCHOLOGIST 09/03/2016  2:30:00 PM

## 2016-09-03 NOTE — Discharge Instr - AVS First Page (Addendum)
Reason for your Hospital Admission:  ***      Instructions for after your discharge:  ***

## 2016-09-03 NOTE — Rehab Progress Note ([facility]) (Addendum)
Corrected 09/04/2016 12:54:57 PM    NAME: Glenda Green  MRN: [id number]  Account: [id number]  Session Start: 09/03/2016 12:00:00 AM  Session Stop: 09/03/2016 12:00:00 AM    Rehabilitation Nursing  Inpatient Rehabilitation Shift Assessment    Rehab Diagnosis: L MCA stroke  Demographics:            Age: 80Y            Gender: Female  Primary Language: English    Date of Onset:  08/08/16  Date of Admission: 08/25/2016 7:16:00 PM    Rehabilitation Precautions Restrictions:   Falls  Aspiration  DM  HTN  3 toes on left foot amputated    Patient Report: "Good Morning"  Pain: Patient currently without complaints of pain.  Pain Reassessment: Pain was not reassessed as no pain was reported.  Patient/Caregiver Goals:  "To be able to walk without any device".    NEURO  Orientation/Awareness: Alert and Oriented x4.  Speech: Clear.  Behavior: Cooperative.    MEDICATIONS  IV Access: No IV access.  Dialysis Access: Patient does not have dialysis access.      Elopement Risk Level Assessment Tool  Patient Criteria: Patient is not capable of leaving the unit.  Assessment is not  applicable.      RISK ASSESSMENT FOR FALLS/INJURY    MENTAL STATUS CRITERIA:   0 - None identified.  MENTAL STATUS TOTAL: 0    AGE CRITERIA:   80 - 44 years old and above  AGE TOTAL: 2    ELIMINATION CRITERIA:   3 - Toileting with Assistance.   5 - Incontinence.   ELIMINATION TOTAL: 8    HISTORY OF FALLS CRITERIA:   2 - Unknown History.  HISTORY OF FALLS TOTAL: 2    MEDICATIONS CRITERIA:   2 or more High Risk Medications (*see list below)   MEDICATIONS TOTAL: 2    PHYSICAL MOBILITY CRITERIA:   3 - Decreased balance reaction.   1 - Weakness/impaired physical mobility.   PHYSICAL MOBILITY TOTAL: 4    FALLS RISK ASSESSMENT TOTAL: 18    Patient's Fall Risk: TOTAL SCORE >10: High Risk    Falls Interventions: Yellow fall risk marker applied to patient identification  band  Oriented to call bell and placed within reach.  Personal items within reach.  Hourly  toileting/safety checks between 6am and 10pm, then every 2 hours.  Initiate fall care plan and outcome.  Clutter removed and clear path to BR.  Reoriented PRN.  Patient and family education.  Pharmacy review of meds.   Hourly Rounding    NUTRITION  Diet:  Type: Consistent carbohydrate.  Food Consistency: Regular.  Liquid Consistency: Thin.      CARDIOVASCULAR     Bilateral lower extremities  Nail Bed Color: Pink.   No edema or redness present.  Pulses:   Apical Pulse: Regular. Strong. Rate is 75 .   Patient does not have a pacemaker.   Patient does not have a defibrillator.    CARDIOPULMONARY  Lung Sounds:   Upper lobes. Clear.   Lower lobes. Clear.  Type of Respirations: Regular.  Cough: No cough noted.  Respiratory Support: The patient does not require any respiratory support.  Respiratory Equipment: None. 97% O2 sat in RA    INTEGUMENTARY  Skin:  Temperature: Warm  Turgor: Normal for age  Moisture: Dry  Color of skin: Normal for Race/Ethnicity  Capillary Refill: Less than 3 seconds   Bruising: abdominal bruising d/t heprin injection.  stage 1 pressure ulcer healed.  Wounds/Incisions:       Surgical Incision: R Breast incision Length: 1 centimeter(s) OTA No signs of  infection.  Drainage: Incision without drainage.  Odor:  No  Incision Care: Per protocol.     Surgical Incision: R Breast fold incision Length: 11 centimeter(s) with glue.  Erythema present.  Drainage: Scant amount of serosanguineous drainage.  Odor:  No  Incision Care: Per protocol.     Surgical Incision: R Chest incision Length: 1 centimeter(s) with glue. No signs  of infection.  Drainage: Incision without drainage.  Odor:  No  Incision Care: Per protocol.     Surgical Incision: R Groin incision Length: 5 centimeter(s) with glue. No signs  of infection.  Drainage: Incision without drainage.  Odor:  No  Incision Care: Per protocol.       R Breast X2 incisions 0.5cm. Clean, dry and intact.  Braden Scale for Predicting Pressure Sore Risk: Sensory  Perception: No  impairment  Moisture: Occasionally moist  Activity: Chairfast  Mobility: Slightly limited  Nutrition: Adequate  Friction and Shear: Potential problem  Braden Score: 17  Level of Risk: At risk (15-18)   Pressure Relief every 30 minutes while in wheelchair   Turn patient every 2 hours   Assist patient to the bathroom every 2 hours   Incontinent Protocol    GASTROINTESTINAL  Abdomen: Soft. Nontender.  Bowel Sounds:  Active bowel sounds audible in all four quadrants.  Date of Last Bowel Movement:  09/03/2016  Current Bowel Pattern:  Incontinent   Problems/Complaints with Bowel Elimination: Loose Stools.    GENITOURINARY  Current Bladder Pattern: Incontinent  Color:  Yellow   Patient denies problems with urination and/or catheter.    Bowel and Bladder Output:                       Bladder (# only)             Bowel (# only)  Number of Episodes  Continent            0                            0  Incontinent          0                            0    MUSCULOSKELETAL  Upper Body: Right hand swollen and weak  Lower Body: right leg weakness    Functional Measures       Bladder Score = 3. Patient performs 50-74% of tasks and requires moderate  assistance for bladder management. Helper provides some assist to apply OR  remove brief.    BLADDER ACCIDENTS THIS SHIFT:  0 . Patient has not had an accident this  assessment and did not require medications or devices.     Bowel Score = 3. Patient performs 50-74% of tasks and requires moderate  assistance for bowel management. Helper provides some assist to apply OR remove  brief.    BOWEL ACCIDENTS THIS SHIFT: 0 . Patient has not had an accident and did not  require medications or devices.    Education Provided:    Education Provided: Precautions. Pain management. Pain scale. Medication  options. Side effects. Clinical indicators of pain. Safety issues and  interventions. Fall protocol. Supervision requirements. Use of  adaptive devices.  Medication. Name and dosage.  Administration. Purpose. Side Effects. Interaction.  Labs. Administration of subcutaneous Heparin injections.       Audience: Patient.       Mode: Explanation.       Response: Verbalized understanding.    Discharge: Patient is not being discharged at this time.    Long Term Goals: 1. Patient will be continent with bladder 100% most of the  time. - New Goal   2. Patient will remain continent of bowel until discharge.   3. Patient will recognize limitations and call for assistance before attempting  mobilty 100% of the time in order to prevent falls.   4. Surgical wound will be clean with no drainage to minimize risk of infection.   5. Patient will verbalized pain level 2/10 after giving medication and  repositioning techniques.  2 weeks after 08/25/2016  Short Term Goals: 1. Patient will call for assistance 75% of the time before  attempting transfer to prevent falls during hospitalization. - Goal Met   2. Patient will verbalize pain  less than 3 out of 10 with or without  medication.   3. Patient identifies correct drug indications, dosage, administration times  and side effects 75% on the medications.  1 week after 08/25/2016    PROGRESS TOWARD GOALS: pt denies any pain, incontinent x2, uses call bell 100%  of the time before attemptimg ambulation, continuing timed toiletting.    PLAN: Nursing Specific Interventions  Bladder Management. Bowel Management. Medication Management. Skin Management.  Wound Management.  Continue with the current Nursing Plan of Care.    TEAM CARE PLAN  Identified problems from team documentation:  Problem: Impaired Bladder Management  Bladder Mgmt: Primary Team Goal: Patient will be continent of bladder at least  75% of the time with timed tolieting/Active    Problem: Impaired Bowel Management  Bowel Mgmt: Primary Team Goal: Patient will be able sustain bowel continence  with timed toileting at least 75% of the time/Active    Problem: Impaired Communication  Communication: Primary Team  Goal: Caregiver will demonstrate appropriate cueing  (i.e. probing questions) to address word finding difficutly in order to  facilitate patient's communicaiton of wants/needs in community./Active    Problem: Impaired Mobility  Mobility: Primary Team Goal: Patient will transfer from sitting to standing and  ambulate 50 feet with LRAD with mod I, ambulate 150 feet with LRAD with  supervision, and climb 12 steps with unilateral HR with CGA so patient can  maintain modified independence on one level and access all levels of sons'  homes./Active    Problem: Impaired Nutrition / Hydration  Nutrition: Primary Team Goal: Maintain meal intake of 75% or more through  LOS/Active    Problem: Impaired Pain Management  Pain Mgmt: Primary Team Goal: Patient will verbalized pain less than 2 out of 10  with the use of medication and positioning techniques.Marland KitchenJorje Guild    Problem: Impaired Psychosocial Skills/Behavior  PsychoSocial: Primary Team Goal: Pt will demonstrate coping skills for adjusting  to medical situation as reflected in maintaining positive expectations for being  able to continue to increase levels of functional independence/Active    Problem: Impaired Self-care Mgmt/ADL/IADL  Self Care: Primary Team Goal: Ms. Lame will perform basic UB/LB dressing at  MOD I./Active    Problem: Safety Risk and Restraint  Safety: Primary Team Goal: Patient will recognize limitations and call for  assistance before attempting mobility 100% of the time in order to prevent  fall./Active  Add/Update Problems from this assessment:  No updates at this time.    Please review Integrated Patient View Care Plan Flowsheet for Team identified  Problems, Interventions, and Goals.    Signed by: Arrie Senate, RN 09/03/2016 10:44:00 AM

## 2016-09-03 NOTE — Rehab Discharge Summary ([facility]) (Signed)
[name]  MRN: [id number]  Account: [id number]  Session Start: 09/03/2016 12:00:00 AM  Session Stop: 09/03/2016 12:00:00 AM    Case Management  Inpatient Rehabilitation Discharge Summary    Rehab Diagnosis: L MCA stroke  Demographics:            Age: 80Y            Gender: Female    Past Medical History: Arthritis  Depression  Hyperlipidemia  Hypertension  Kidney disorder  Type 2 diabetes mellitus  Urinary tract infections    SURGICAL HISTORY :  CABG 2010  Cystoscopy; ureteral stent 08/05/2016  Metatarsal amputation: 2015; three toes  L TKA 2010  History of Present Illness: 80  year old female with PMH HTN, hyperlipidemia,  Type 2 DM, CKD, partial L foot amputation, CAD s/p CABG  admitted [facility] 08/08/16  after family found her in her room struggling to put on her shirt and unable to  speak. MRI brain showed  acute left MCA distribution CVA. She was outside window  for lytics. Plavix started. Cardiac workup found large RA occupying mass and  possible interatrial shunt. 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  On 9/29 underwent cardiac cath and R thoracotomy for excision of axial  septal mass and PFO closure. Left heart cath 08/17/16 showed  3/4 2010 bypass  grafts patent with chronic occlusion of RCA.   Date of Onset:  08/08/16   Date of Admission: 08/25/2016 7:16:00 PM    Discharge Date: Friday, September 04, 2016  Transportation: Family to transport by private vehicle.  Discharge Plan: Home with home health services  Community Resources: none    Follow Up Appointment(s):  We understand that you will transition to your [place]  in [place] . Listed below are the providers consulted during your hospital  and acute rehab admissions, for your reference, as you identify specialty care  providers near your home.    Boneta Lucks, NP - Primary Care  Schedule follow up within one (1) week post discharge.  [address]  [address]  [place], [place] [address]  [phone]    Christene Lye, MD -  Thoracic and Cardiac Surgery, Surgery, Surgical Critical  Care  [facility]  [address]  [place] [address]  Phone: [phone]  Fax: [phone]    Jonetta Speak, MD - Cardiology  [facility] [place]  [address]  [place] [address]  Phone: [phone]  Fax: [phone]    Tescher, Hoyle Sauer, MD - Urology  [facility]  [address]  [address]  [place] [address]  Phone: [phone]  Fax: [phone]    Jannet Mantis, MD - Neurology  [facility]  [address]  [address]  [place] [address]  Phone: [phone]  Fax: [phone]          Troy Sine, MD - Nephrology  [facility]  [address]  [address]  [place] [address]  Phone: [phone]  Fax: [phone]    Clyde Canterbury, MD - Endocrinology  [facility]  [address]  Department of Medicine  [place] [address]  Phone: [phone]  Fax: [phone]    Earl Many, DO - Brain Injury/Physical Medicine and Rehabilitation  [facility]  [address]  [facility]  [place] [address]  Phone: [phone]  Fax: [phone]      Contact information for your acute rehab case manager is included in your  binder, for your reference    Education Provided:  No education provided this session.    Renotification of Medicare Important Message: The Renotification of Medicare  Important Message letter was issued.    Care Plan  Identified problems from team documentation:  Problem: Impaired Bladder Management  Bladder Mgmt: Primary Team Goal: Patient will be continent of bladder at least  75% of the time with timed tolieting/Active    Problem: Impaired Bowel Management  Bowel Mgmt: Primary Team Goal: Patient will be able sustain bowel continence  with timed toileting at least 75% of the time/Active    Problem: Impaired Communication  Communication:  Primary Team Goal: Caregiver will demonstrate appropriate cueing  (i.e. probing questions) to address word finding difficutly in order to  facilitate patient's communicaiton of wants/needs in community./Active    Problem: Impaired Mobility  Mobility: Primary Team Goal: Patient will transfer from sitting to standing and  ambulate 50 feet with LRAD with mod I, ambulate 150 feet with LRAD with  supervision, and climb 12 steps with unilateral HR with CGA so patient can  maintain modified independence on one level and access all levels of sons'  homes./Active    Problem: Impaired Nutrition / Hydration  Nutrition: Primary Team Goal: Maintain meal intake of 75% or more through  LOS/Active    Problem: Impaired Pain Management  Pain Mgmt: Primary Team Goal: Patient will verbalized pain less than 2 out of 10  with the use of medication and positioning techniques.Marland KitchenJorje Guild    Problem: Impaired Psychosocial Skills/Behavior  PsychoSocial: Primary Team Goal: Pt will demonstrate coping skills for adjusting  to medical situation as reflected in maintaining positive expectations for being  able to continue to increase levels of functional independence/Active    Problem: Impaired Self-care Mgmt/ADL/IADL  Self Care: Primary Team Goal: Ms. Leeth will perform basic UB/LB dressing at  MOD I./Not Met    Problem: Safety Risk and Restraint  Safety: Primary Team Goal: Patient will recognize limitations and call for  assistance before attempting mobility 100% of the time in order to prevent  fall./Active    Status update for discharge:      Please review Integrated Patient View Care Plan Flowsheet for Team identified  Problems, Interventions, and Goals.    Signed by: Arlie Solomons, MSW 09/03/2016 12:22:00 PM

## 2016-09-03 NOTE — Rehab Progress Note ([facility]) (Signed)
[name]  MRN: [id number]  Account: [id number]  Session Start: 09/03/2016 12:00:00 AM  Session Stop: 09/03/2016 12:00:00 AM    Rehabilitation Nursing  Inpatient Rehabilitation Shift Assessment    Rehab Diagnosis: L MCA stroke  Demographics:            Age: 80Y            Gender: Female  Primary Language: English    Date of Onset:  08/08/16  Date of Admission: 08/25/2016 7:16:00 PM    Rehabilitation Precautions Restrictions:   Falls  Aspiration  DM  HTN  3 toes on left foot amputated    Patient Report: OK  Pain: Patient currently without complaints of pain.  Pain Reassessment: Pain was not reassessed as no pain was reported.  Patient/Caregiver Goals:  "To be able to walk without any device".    NEURO  Orientation/Awareness: Alert and Oriented x3.  Speech: Clear.  Behavior: Cooperative.    MEDICATIONS  IV Access: No IV access.  Dialysis Access: Patient does not have dialysis access.      Elopement Risk Level Assessment Tool  Patient Criteria: Patient is not capable of leaving the unit.  Assessment is not  applicable.      RISK ASSESSMENT FOR FALLS/INJURY    MENTAL STATUS CRITERIA:   0 - None identified.  MENTAL STATUS TOTAL: 0    AGE CRITERIA:   80 - 85 years old and above  AGE TOTAL: 2    ELIMINATION CRITERIA:   3 - Toileting with Assistance.   5 - Incontinence.   ELIMINATION TOTAL: 8    HISTORY OF FALLS CRITERIA:   2 - Unknown History.  HISTORY OF FALLS TOTAL: 2    MEDICATIONS CRITERIA:   2 or more High Risk Medications (*see list below)   MEDICATIONS TOTAL: 2    PHYSICAL MOBILITY CRITERIA:   3 - Decreased balance reaction.   1 - Weakness/impaired physical mobility.   PHYSICAL MOBILITY TOTAL: 4    FALLS RISK ASSESSMENT TOTAL: 18    Patient's Fall Risk: TOTAL SCORE >10: High Risk    Falls Interventions: Oriented to call bell and placed within reach.  Personal items within reach.  Hourly toileting/safety checks between 6am and 10pm, then every 2 hours.  Bed alarm in place and activated.    NUTRITION  Diet:   Type: Regular.  Food Consistency: Regular.  Liquid Consistency: Thin.      CARDIOVASCULAR     Bilateral lower extremities  Nail Bed Color: Pink.   No edema or redness present.  Pulses:   Apical Pulse: Regular. Strong. Rate is 71 .   Patient does not have a pacemaker.   Patient does not have a defibrillator.    CARDIOPULMONARY  Lung Sounds:   Upper lobes. Clear.   Lower lobes. Clear.  Type of Respirations: Regular.  Cough: No cough noted.  Respiratory Support: The patient does not require any respiratory support.  Respiratory Equipment: None.    INTEGUMENTARY  Skin:  Temperature: Warm  Turgor: Normal for age  Moisture: Dry  Color of skin: Normal for Race/Ethnicity  Capillary Refill: Less than 3 seconds   Bruising: Abdominal bruising due to lovenox injection.  Wounds/Incisions:       underneath right breast incision with dressing covered clean,dry and intact.  Braden Scale for Predicting Pressure Sore Risk: Sensory Perception: No  impairment  Moisture: Occasionally moist  Activity: Chairfast  Mobility: Slightly limited  Nutrition: Adequate  Friction and Shear: Potential problem  Braden Score: 17  Level of Risk: At risk (15-18)   Turn patient every 2 hours   Assist patient to the bathroom every 2 hours    GASTROINTESTINAL  Abdomen: Soft.  Bowel Sounds:  Active bowel sounds audible in all four quadrants.  Date of Last Bowel Movement:  09/02/16  Current Bowel Pattern:  Incontinent   No Problems/Complaints with Bowel Elimination Assessed.    GENITOURINARY  Current Bladder Pattern: Incontinent  Color:  Yellow   Patient denies problems with urination and/or catheter.    Bowel and Bladder Output:                       Bladder (# only)             Bowel (# only)  Number of Episodes  Continent            1                            0  Incontinent          2                            1    MUSCULOSKELETAL  Upper Body: Right hand swollen and weak  Lower Body: right leg weakness    Functional Measures       Bladder Score = 3.  Patient performs 50-74% of tasks and requires moderate  assistance for bladder management. Helper provides some assist to apply OR  remove brief.    BLADDER ACCIDENTS THIS SHIFT:  0 . Patient has not had an accident this  assessment and did not require medications or devices.     Bowel Score = 3. Patient performs 50-74% of tasks and requires moderate  assistance for bowel management. Helper provides some assist to apply OR remove  brief.    BOWEL ACCIDENTS THIS SHIFT: 0 . Patient has not had an accident and did not  require medications or devices.    Education Provided:    Education Provided: Pain management. Pain scale.       Audience: Patient.       Mode: Explanation.       Response: Needs reinforcement.    Discharge: Patient is not being discharged at this time.    Long Term Goals: 1. Patient will be continent with bladder 100% most of the  time. - New Goal   2. Patient will remain continent of bowel until discharge.   3. Patient will recognize limitations and call for assistance before attempting  mobilty 100% of the time in order to prevent falls.   4. Surgical wound will be clean with no drainage to minimize risk of infection.   5. Patient will verbalized pain level 2/10 after giving medication and  repositioning techniques.  2 weeks after 08/25/2016  Short Term Goals: 1. Patient will call for assistance 75% of the time before  attempting transfer to prevent falls during hospitalization. - Goal Not Met   2. Patient will verbalize pain  less than 3 out of 10 with or without  medication.   3. Patient identifies correct drug indications, dosage, administration times  and side effects 75% on the medications.  1 week after 08/25/2016    PROGRESS TOWARD GOALS: SHORT TERM GOAL REVIEW:       1. Patient will call for assistance 75% of the  time before attempting  transfer to prevent falls during hospitalization. - Met  Time frame to achieve short term goal(s):  1 week after 08/25/2016    PLAN: Nursing Specific  Interventions  Bladder Management. Bowel Management. Medication Management. Skin Management.  Wound Management.  Continue with the current Nursing Plan of Care.    TEAM CARE PLAN  Identified problems from team documentation:  Problem: Impaired Bladder Management  Bladder Mgmt: Primary Team Goal: Patient will be continent of bladder at least  75% of the time with timed tolieting/Active    Problem: Impaired Bowel Management  Bowel Mgmt: Primary Team Goal: Patient will be able sustain bowel continence  with timed toileting at least 75% of the time/Active    Problem: Impaired Communication  Communication: Primary Team Goal: Caregiver will demonstrate appropriate cueing  (i.e. probing questions) to address word finding difficutly in order to  facilitate patient's communicaiton of wants/needs in community./Active    Problem: Impaired Mobility  Mobility: Primary Team Goal: Patient will transfer from sitting to standing and  ambulate 50 feet with LRAD with mod I, ambulate 150 feet with LRAD with  supervision, and climb 12 steps with unilateral HR with CGA so patient can  maintain modified independence on one level and access all levels of sons'  homes./Active    Problem: Impaired Nutrition / Hydration  Nutrition: Primary Team Goal: Maintain meal intake of 75% or more through  LOS/Active    Problem: Impaired Pain Management  Pain Mgmt: Primary Team Goal: Patient will verbalized pain less than 2 out of 10  with the use of medication and positioning techniques.Marland KitchenJorje Guild    Problem: Impaired Psychosocial Skills/Behavior  PsychoSocial: Primary Team Goal: Pt will demonstrate coping skills for adjusting  to medical situation as reflected in maintaining positive expectations for being  able to continue to increase levels of functional independence/Active    Problem: Impaired Self-care Mgmt/ADL/IADL  Self Care: Primary Team Goal: Ms. Rhudy will perform basic UB/LB dressing at  MOD I./Active    Problem: Safety Risk and  Restraint  Safety: Primary Team Goal: Patient will recognize limitations and call for  assistance before attempting mobility 100% of the time in order to prevent  fall./Active    Add/Update Problems from this assessment:  No updates at this time.    Please review Integrated Patient View Care Plan Flowsheet for Team identified  Problems, Interventions, and Goals.    Signed by: Nelda Marseille, RN 09/03/2016 12:39:00 AM

## 2016-09-03 NOTE — Rehab Progress Note ([facility]) (Signed)
[name]  MRN: [id number]  Account: [id number]  Session Start: 09/03/2016 12:00:00 AM  Session Stop: 09/03/2016 12:00:00 AM    Case Management  Inpatient Rehabilitation Team Conference    Conference Date/Time: 09/02/2016 1:40:00 PM    Demographics            Age: 80Y            Gender: Female    Admission Date: 08/25/2016 7:16:00 PM  Diagnosis: L MCA stroke  Comorbidities:    VITAL SIGNS  Blood Pressure: 126/68 mmHg  Temperature: 36.27 degrees  Pulse: 70 beats per minute  Respirations: 18 breaths per minute  Pain: 5/10    WEIGHT and NUTRITION  Admission Weight: 164 pounds; Current Weight: 164pounds  Weight Change since Admit: Patient has had no weight change since admission.  Food Consistency: Regular  Liquid Consistency:Thin    Plan Of Care  Anticipated Discharge Date/Estimated Length of Stay: 10/13  Anticipated Discharge Destination: Community discharge with assistance  Discharge Plan : Home with [facility]  Medical Necessity Expected Level Rationale: 80 year old right-handed female with  mobility/ADL dysfunction due to left MCA stroke.  Intensity and Duration: an average of 3 hours/5 days per week  Medical Supervision and 24 Hour Rehab Nursing: x  Physical Therapy: x  PT Intensity/Duration: 60-120'daily for 5-6 d/wk  Occupational Therapy: x  OT Intensity/Duration: 60-120'daily for 5-6 d/wk  Speech and Language Therapy: x  SLP Intensity/Duration: 60-120'daily for 5-6 d/wk  Case Management: Arlie Solomons, CM  Nurse: Ranae Pila, RN  Occupational Therapy: Melinda Crutch, OT  Physical Therapy: Dorothey Baseman, PT  Physician: Hendricks Limes, MD  Speech Language Pathology: Carolynn Comment, SLP  Occupational Therapy Other: Carmelina Dane OT for Melinda Crutch OT  Physical Therapy Other: Magda Kiel PT for [facility]  Speech Language Pathology Other: Bertram Gala SLP for Judd Lien SLP    The following is a list of patient problems that have been identified by the  interdisciplinary team:    Problem:  Impaired Bladder Management  Bladder Management Status Update: with incont. episodes due to urgency and no  awareness of voids requiring max to total assist for accidents  Team Identified Barrier to Discharge: Yes  Timed voids/scheduled toileting: Active  Bladder retraining: Active  Other Bladder Management Intervention 1 - Text: education.  Other Bladder Management Intervention 1 - Status: Active  Bladder Mgmt: Primary Team Goal/Status: Patient will be continent of bladder at  least 75% of the time with timed tolieting / Active    Problem: Impaired Bowel Management  Bowel Management Status Update: Has incotinent episodes.  Team Identified Barrier to Discharge: Yes  Interventions:  Medications as ordered: Active  Digital stimulation: Active  Maintain adequate fluid intake by mouth: Active  Reinforce proper diet and fluids: Active  Elimination schedule: Active  Perineal care: Active  Equipment assessment and recommendation: Active  Patient/family/caregiver education: Active  Other Bowel Management Intervention 1 - Text: education.  Other Bowel Management Intervention 1 - Status: Active  Bowel Mgmt: Primary Team Goal/Status: Patient will be able sustain bowel  continence with timed toileting at least 75% of the time / Active    Problem: Impaired Communication  Communication Status Update: family training indicated to initiate use of  appropriate cues for word finding deficits; pt slowly beginning to utilize  strategies more independently  Team Identified Barrier to Discharge: Yes  Interventions:  Compensatory strategies: Active  Use simple brief communication techniques: Active  Communication: Primary Team Goal/Status: Caregiver will  demonstrate appropriate  cueing (i.e. probing questions) to address word finding difficutly in order to  facilitate patient's communicaiton of wants/needs in community. / Active    Problem: Impaired Mobility  Mobility Status Update: Pt is able to ambulate 100 feet with RW and climbing  8  steps with bilateral HR with CGA  Team Identified Barrier to Discharge: Yes  Interventions:  Transfer training: Active  Gait training: Active  Mobility: Primary Team Goal/Status: Patient will transfer from sitting to  standing and ambulate 50 feet with LRAD with mod I, ambulate 150 feet with LRAD  with supervision, and climb 12 steps with unilateral HR with CGA so patient can  maintain modified independence on one level and access all levels of sons'  homes. / Active    Problem: Impaired Nutrition / Hydration  Nutrition Status Update: consuming 75-100% of meals  Team Identified Barrier to Discharge: No  Interventions:  Assess nutritional deficits and needs: Active  Assess and monitor hydration: Active  Nutrition: Primary Team Goal/Status: Maintain meal intake of 75% or more through  LOS / Active    Problem: Impaired Pain Management  Pain Management Status Update: has been without discomfort at this time  Team Identified Barrier to Discharge: No  Interventions:  Medications as ordered: Active  Distraction and/or relaxation: Active  Positioning: Active  Pain Mgmt: Primary Team Goal/Status: Patient will verbalized pain less than 2  out of 10 with the use of medication and positioning techniques.Marland Kitchen / Active    Problem: Impaired Psychosocial Skills/Behavior  PsychoSocial/Behavior Status Update: Working on coping and adjustment issues  Team Identified Barrier to Discharge: No  Interventions:  Supportive counseling: Active  Coping skills training: Active  Encourage verbalization of feelings: Active  Encourage interpersonal appropriateness and communication: Active  PsychoSocial: Primary Team Goal/Status: Pt will demonstrate coping skills for  adjusting to medical situation as reflected in maintaining positive expectations  for being able to continue to increase levels of functional independence /  Active    Problem: Impaired Self-care Mgmt/ADL/IADL  Self Care/ADL/IADL Status Update: Glenda Green is Min A bathing, S  grooming, S UB  dressing, Min A LB dressing, S-CGA transfers, Max A toileting, incontinent of  B/B  Team Identified Barrier to Discharge: Yes  Interventions:  Adaptive equipment training: Active  Compensatory strategies: Active  Encourage patient to participate in activities of daily living: Active  Self Care: Primary Team Goal/Status: Glenda Green will perform basic UB/LB  dressing at MOD I. / Active    Problem: Safety Risk and Restraint  Safety Risk Status Update: cues needed for safety precautions; learning process  Team Identified Barrier to Discharge: Yes  Interventions:  Identify patient at risk for falls: Active  Call light within reach: Active  Orient patient/family/caregiver to room and equipment: Active  1:1 supervision: Active  Stay with patient in bathroom: Active  Bed alarm/wheelchair alarm: Active  Anticipate needs, including personal items within reach: Active  Safety: Primary Team Goal/Status: Patient will recognize limitations and call  for assistance before attempting mobility 100% of the time in order to prevent  fall. / Active    Fall Risk Assessment: TOTAL SCORE >10: High Risk    Functional Measures  Eating: 5  Grooming: 5  Bathing: 4  Upper Body Dressing: 5  Lower Body Dressing: 4  Toileting: 3  Bladder Level of Assistance: 3  Bowel Level of Assistance: 3  Bed/Chair/Wheelchair Transfer: 5  Toilet Transfer: 4  Tub Transfer:  Shower Transfer: 4  Wheelchair: 0  Walk:  2  Stairs: 2  Comprehension: 5  Expression: 5  Social Interaction: 6  Problem Solving: 4  Memory: 4  Bladder Frequency of Accidents - Admission Period: IRF Score = 5 - One accident  in the past 7 days  Bowel Frequency of Accidents - Admission Period: IRF Score = 6 - No accidents;  uses device    KEY TO FIM LEVELS (General)  7 The patient completes the task or skill independently in a timely manner.  6 The patient completes the task or skill without a helper but uses an assistive  device, has mild difficulty or needs extra time.  5 The  patient performs 100% of the task with supervision.  4 The patient performs 75-90% of the effort to complete the task or skill.  3 The patient performs 50-74% of the effort to complete the task or skill.  2 The patient performs 25-49% of the effort to complete the task or skill.  1 The patient performs 0-24% of the effort to complete the task or skill.  0 The task or skill does not occur because it is unsafe, contraindicated or the  patient refused.    Walk/Wheelchair Functional Measures scoring parameters:  7 Patient walks/propels a minimum of 150 feet independently.  6 Patient walks/propels a minimum of 150 feet without a helper but with a device  or safety concerns.  5 Patient walks/propels 50 feet independently OR walks a minimum of 150 feet  with supervision.  4 Patient performs 75-90% of the effort to walk/propel a minimum of 150 feet.  3 Patient performs 50-74% of the effort to walk/propel a minimum of 150 feet.  2 Patient walks/propels a minimum of 50 feet with any amount of assistance from  a helper.  1 Patient walks/propels less than 50 feet with any amount of assistance or needs  two helpers for any distance.    Stair Functional Measures scoring parameters:  7 Patient goes up and down 12 to 14 stairs independently.  6 Patient goes up and down 12 to 14 stairs with a device but without a helper.  5 Patient goes up and down 4 to 6 stairs independently OR 12 to 14 stairs with  supervision.  4 Patient performs 75-90% of the effort to go up and down 12 to 14 stairs.  3 Patient performs 50-74% of the effort to go up and down 12 to 14 stairs.  2 Patient goes up and down 4 to 6 stairs with any amount of assistance from a  helper.  1 Patient goes up and down less than 4 to 6 stairs, needs two helpers for any  number of stairs or uses a stair lift.    Comments: none    Signed by: Arlie Solomons, MSW 09/03/2016 9:22:00 AM    Physician CoSigned By: Hendricks Limes 09/03/2016 10:57:15

## 2016-09-03 NOTE — Rehab Discharge Summary ([facility]) (Signed)
Glenda Green  MRN: [id number]  Account: [id number]  Session Start: 09/03/2016 1:00:00 PM  Session Stop: 09/03/2016 2:00:00 PM    Physical Therapy  Inpatient Rehabilitation Discharge Summary and Note    Rehab Diagnosis: L MCA stroke  Demographics:            Age: 80Y            Gender: Female  Primary Language: English  Past Medical History: Arthritis  Depression  Hyperlipidemia  Hypertension  Kidney disorder  Type 2 diabetes mellitus  Urinary tract infections    SURGICAL HISTORY :  CABG 2010  Cystoscopy; ureteral stent 08/05/2016  Metatarsal amputation: 2015; three toes  L TKA 2010  History of Present Illness: 80  year old female with PMH HTN, hyperlipidemia,  Type 2 DM, CKD, partial L foot amputation, CAD s/p CABG  admitted [facility] 08/08/16  after family found her in her room struggling to put on her shirt and unable to  speak. MRI brain showed  acute left MCA distribution CVA. She was outside window  for lytics. Plavix started. Cardiac workup found large RA occupying mass and  possible interatrial shunt. 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  On 9/29 underwent cardiac cath and R thoracotomy for excision of axial  septal mass and PFO closure. Left heart cath 08/17/16 showed  3/4 2010 bypass  grafts patent with chronic occlusion of RCA.   Date of Onset: 08/08/16   Date of Admission: 08/25/2016 7:16:00 PM  Rehabilitation Precautions/Restrictions:   Falls  Aspiration  DM  HTN  3 toes on left foot amputated    SUBJECTIVE  Patient Report: "I definitely feel like my balance has improved since I've been  here."  Pain: Patient without c/o or clinical indicator of pain during session.    OBJECTIVE  Vital Signs:                       Before Activity              After Activity  Vitals  Time                 1305                         1400  Position/Activity    sitting                      supine  BP Systolic          182                          164  BP Diastolic         469                           74  Pulse                77                           72  Bed Mobility: Patient is independent with all aspects of bed mobility, able to  roll in each direction and scoot in bed without bedrails.    Refer to Functional Measures for remainder of functional mobility status.    Functional Measures  TRANSFERS BED/CHAIR/WHEELCHAIR: Bed/chair/wheelchair Transfer Score = 5.  Patient is supervision/set-up for transferring to and from the  bed/chair/wheelchair, requiring: Stand by assistance.  Verbal cuing, prompting, or instructing.  Set up (positioning equipment, lock brakes and/or adjust foot rest).  Patient requires the following assistive device(s): Walker.  Arm rest.    LOCOMOTION WHEELCHAIR:   Wheelchair did not occur. Patient will not have w/c to use after d/c from  rehab, so w/c mobility is not assessed at this time.    LOCOMOTION WALK:   Walk Distance Scale = 3.  Distance walked is greater than 150 feet. Walk Score  = 5.  Patient requires supervision or set up for walking. Patient walked a  distance of  150 feet. Rolling walker. Patient is able to ambulate short  distances (50 feet or less) with the RW with mod I, although supervision is  still recommended initially after d/c from rehab, due to impaired dynamic  standing balance.    LOCOMOTION STAIRS: Stairs Score = 4.  Incidental help/contact guard/steadying  was provided. Patient performs 75% or more of effort and requires minimal  assistance. Patient negotiated 12 stairs.  Patient requires the following assistive device(s):  Handrail(s). Patient is  able to ascend steps with support of unilateral HR using reciprocal gait pattern  with SBA, but needs HHA on contralateral side from HR when descending steps and  prefers to utilize step-to gait pattern.    CARE Tool Discharge Performance  Mobility:   Roll Left and Right: Patient completes the activities by him/herself with no  assistance from a helper.   Sit to Lying: Patient completes the activities by  him/herself with no  assistance from a helper.   Lying to Sitting on Side of Bed: Patient completes the activities by  him/herself with no assistance from a helper.   Sit to Stand: Helper provides verbal cues or touching/steadying assistance as  patient completes activity.   Chair/Bed to Chair Transfer: Helper provides verbal cues or touching/steadying  assistance as patient completes activity.   Car Transfer: Helper provides verbal cues or touching/steadying assistance as  patient completes activity.    Patient Walks:  Yes   Walk 10 Feet: Patient completes the activities by him/herself with no  assistance from a helper.   Walk 50 Feet With 2 Turns: Helper provides verbal cues or touching/steadying  assistance as patient completes activity.   Walk 150 Feet: Helper provides verbal cues or touching/steadying assistance as  patient completes activity.   Walking 10 Feet on Uneven Surfaces: Helper provides verbal cues or  touching/steadying assistance as patient completes activity.   1 Step Over Curb or Up/Down Stair: Helper provides verbal cues or  touching/steadying assistance as patient completes activity.   4 Steps Up and Down, With/Without Rail: Helper provides verbal cues or  touching/steadying assistance as patient completes activity.   12 Steps Up and Down, With/Without Rail: Helper provides verbal cues or  touching/steadying assistance as patient completes activity.   Picking up an Object: Helper provides verbal cues or touching/steadying  assistance as patient completes activity.     Uses Wheelchair/Scooter: No    Today's Treatment Interventions:       Therapeutic Activities:  Patient received sitting in w/c, having finished  eatling lunch, ready to participate with P.T. Patient used RW to practice  ambulating into BR where toilet transfer and doffing clothing, as well as  pericare, was completed with supervision, but P.T. provided mod-maxA to don  pants.  Sit to stand transfer  training was provided with  patient reminded to  place unilateral UE on RW and contralateral UE on sitting surface to increase  safety while completing. Gait training was provided times 150 feet with RW with  SBA and times 100 feet with rollator with SBA. Gait training also provided  up/down 12 steps with unilateral HR with SBA-CGA, as described above. Patient  practiced moving from sitting to/from supine on flat mat and demonstrates  independence completing this activity. Education about continuing to ambulate  regularly with RW/rollator at home was provided for patient in order to maintain  and further increase her endurance.  Patient returned to room, transferring into  bed to rest.  Patient was returned to or left in bed. Bed alarm in place and activated.  Oriented to call bell and placed within reach.  Personal items within reach.  Assistive devices positioned out of reach.  Bed placed in lowest position.    Education Provided:    Education Provided: Plan of care. Safety issues and interventions. Use of  adaptive devices. Supervision requirements. Functional transfers. Gait.  Stair/curb/environmental barrier negotiation. Home exercise/activity plan.       Audience: Patient.       Mode: Explanation.  Demonstration.   Practice.       Response: Applied knowledge.  Verbalized understanding.  Demonstrated skill.  Needs practice.  Needs reinforcement.    Equipment Provided/Ordered: None at this time.  Patient already has RW and rollator available to use at home.    ASSESSMENT  Summary of Progress and Current Status: Patient is demonstrating good ability to  complete bed mobility and move from sitting to/from supine safely and  independently at this time, and her standing balance has improved significantly  since admitted to rehab, however, continues to be impaired, leading patient to  intermittenly experience posterior LOB.  While patient is displaying much  improved balance reactions with and without UE support, patient needs  continued  training and practice to promote further improvement in standing balance and  HHPT is recommended.    Previously Documented Mode of Locomotion at Discharge: Walk  PAI Expected Mode of Locomotion at Discharge: No change to the current  documented expected, most frequently used mode of locomotion at discharge    Progress Toward Goals (final status):   LONG TERM GOAL REVIEW:       1. Patient will transfer from sitting to standing to LRAD with mod I so  patient can toilet independently.  - Not Met: Although patient is able to  transfer from sitting to standing to RW without lifting assistance, patient  still often pushes up with bilateral UE on RW, which is not as safe as placing  unilateral UE on RW and contralateral UE on sitting surfaces.       2. Patient will ambulate 50 feet with LRAD with mod I so patient can  complete household mobility on one level. - Met Using RW or rollator.       3. Patient will ambulate 150 feet with LRAD with SBA so patient can access  community for MD appointments. - Met Using RW.       4. Patient will climb 12 steps with unilateral HR with CGA so patient can  access all levels of sons' homes. - Met See above in FIM scoring for more  complete description of how patient climbs stairs.       5. Patient's family will demonstrate independence providing patient with  any needed supervision-assistance with functional mobility  prn. - Met Further  family training is recommended via HHPT, but 2 of patient's daughters-in-law  received some family training in providing supervision for patient and  demonstrated good understanding of her need for supervision in order to maintain  safety.    PLAN  Physical Therapy Plan: Patient is recommended for home health therapy services.    Team Care Plan  Please review Integrated Patient View Care Plan Flowsheet for Team identified  Problems, Interventions, and Goals.    Identified problems from team documentation:  Problem: Impaired Bladder  Management  Bladder Mgmt: Primary Team Goal: Patient will be continent of bladder at least  75% of the time with timed tolieting/Active    Problem: Impaired Bowel Management  Bowel Mgmt: Primary Team Goal: Patient will be able sustain bowel continence  with timed toileting at least 75% of the time/Active    Problem: Impaired Communication  Communication: Primary Team Goal: Caregiver will demonstrate appropriate cueing  (i.e. probing questions) to address word finding difficutly in order to  facilitate patient's communicaiton of wants/needs in community./Active    Problem: Impaired Mobility  Mobility: Primary Team Goal: Patient will transfer from sitting to standing and  ambulate 50 feet with LRAD with mod I, ambulate 150 feet with LRAD with  supervision, and climb 12 steps with unilateral HR with CGA so patient can  maintain modified independence on one level and access all levels of sons'  homes./Active    Problem: Impaired Nutrition / Hydration  Nutrition: Primary Team Goal: Maintain meal intake of 75% or more through  LOS/Active    Problem: Impaired Pain Management  Pain Mgmt: Primary Team Goal: Patient will verbalized pain less than 2 out of 10  with the use of medication and positioning techniques.Marland KitchenJorje Guild    Problem: Impaired Psychosocial Skills/Behavior  PsychoSocial: Primary Team Goal: Pt will demonstrate coping skills for adjusting  to medical situation as reflected in maintaining positive expectations for being  able to continue to increase levels of functional independence/Active    Problem: Impaired Self-care Mgmt/ADL/IADL  Self Care: Primary Team Goal: Ms. Levenhagen will perform basic UB/LB dressing at  MOD I./Active    Problem: Safety Risk and Restraint  Safety: Primary Team Goal: Patient will recognize limitations and call for  assistance before attempting mobility 100% of the time in order to prevent  fall./Active    Status update for discharge:     Mobility:   Primary Goal: Not Met  Discharge Status  Comment:   Patient met all aspects of mobility goal except for  achieving mod I when transferring from sitting to standing to RW, due to  patient's intermittent need for reminding to use optimal hand placement while  completing.    3 Hour Rule Minutes: 60 minutes of PT treatment this session count towards  intensity and duration of therapy requirement. Patient was seen for the full  scheduled time of PT treatment this session.  Therapy Mode Minutes: Individual: 60 minutes.    Signed by: Dorothey Baseman, PT, DPT 09/03/2016 2:00:00 PM

## 2016-09-03 NOTE — Progress Notes (Signed)
[facility] Physiatry Attending Face to Face Progress Note    Date Time: 09/03/16 6:29 PM  Patient Name: Glenda Green, Glenda Green    Admission date:  08/25/2016    Functional Status/Update:     [x] I I reviewed patient's therapy notes to assess functional status and ongoing need for therapies.   Of note:  TRANSFERS BED/CHAIR/WHEELCHAIR: Bed/chair/wheelchair Transfer Score = 5.  Patient is supervision/set-up for transferring to and from the  bed/chair/wheelchair, requiring: Stand by assistance.  Verbal cuing, prompting, or instructing.  Set up (positioning equipment, lock brakes and/or adjust foot rest).  Patient requires the following assistive device(s): Walker.  Arm rest.    LOCOMOTION WHEELCHAIR:   Wheelchair did not occur. Patient will not have w/c to use after d/c from  rehab, so w/c mobility is not assessed at this time.    LOCOMOTION WALK:   Walk Distance Scale = 3.  Distance walked is greater than 150 feet. Walk Score  = 5.  Patient requires supervision or set up for walking. Patient walked Green  distance of  150 feet. Rolling walker. Patient is able to ambulate short  distances (50 feet or less) with the RW with mod I, although supervision is  still recommended initially after d/c from rehab, due to impaired dynamic  standing balance.    LOCOMOTION STAIRS: Stairs Score = 4.  Incidental help/contact guard/steadying  was provided. Patient performs 75% or more of effort and requires minimal  assistance. Patient negotiated 12 stairs.      Subjective and interval development:   Feels well, has no new complaints    Review of Systems:   Green comprehensive review of systems was: No fevers, chills, nausea, vomiting, chest pain, shortness of breath, cough, headache, double vision    Objective:   Vitals:  Vitals:    09/03/16 0906 09/03/16 1313 09/03/16 1357 09/03/16 1655   BP: 133/73 (!) 162/106 164/74 141/73   Pulse: 75 77 72 65   Resp:    18   Temp:    97.3 F (36.3 C)   TempSrc:    Oral   SpO2:    97%   Weight:       Height:          Physical Examination:  General appearance: .  In no acute distress. Normal body habitus.  RU:EAVWU rate and rhythm are regular. No significant lower limb edema.  Pulm: Clear to auscultation bilaterally.  No wheezes, rales, or rhonchi.  JWJ:XBJY. Non-tender. Normoactive bowel sounds.  Skin: Right lateral thoracic Incision, resolving minimal erythema, no drainage  Neuro: Alert.  Hearing intact bilaterally to conversational speech.   .Sensation to LT is intact  Musculoskeletal:   ROM-within functional limits         Manual muscle strength testing:  Muscle group Left Right   Shoulder abductors 5 3-   Elbow flexors 5 3   Elbow extensors 5 4   Finger flexors 5 4   Hip flexors 4 4   Knee flexors 5 4   Knee extensors 5 4   Ankle dorsiflexors 5 5   Ankle plantarflexors 5 5   EHL 5 5     Extremities: Left foot: Amputation of the 1-3 toes    New Labs:   No results for input(s): GLUCOSEWHOLE in the last 24 hours.      Recent Labs  Lab 09/03/16  0533 09/02/16  0546   WBC 14.02* 14.87*   Hgb 10.6* 10.9*   Hematocrit 32.8* 34.1*   Platelets  374 377          Recent Labs  Lab 09/03/16  0533 09/02/16  0546 08/31/16  0523   Sodium  --  136 137   Potassium 5.4* 5.4* 4.5   Chloride  --  105 105   CO2  --  24 22   BUN  --  25* 29*   Creatinine  --  1.7* 1.9*   Calcium  --  9.0 8.5   Glucose  --  114* 110*     Urine analysis: Pyuria  Urine culture: Gram-negative rods more than 100,000 colonies           Rads:   Venous ultrasound, 08/26/16-no deep vein thrombosis in bilateral lower extremities  Rads:   Radiological Procedure reviewed.  Radiology Results (24 Hour)     Procedure Component Value Units Date/Time    XR Chest 2 Views [[id number]] Collected:  09/03/16 1653    Order Status:  Completed Updated:  09/03/16 1659    Narrative:        CLINICAL INDICATION: cough    COMPARISON: 08/23/2016    FINDINGS:   2 views of the chest were obtained. The chest tube has been  removed. Lung volumes low. Right midlung scar versus plate  atelectasis  noted. Hiatal hernia opacifies the midline retrocardiac region. No  airspace infiltrate. No pneumothorax. The heart and vascularity are  within normal limits. There is no pleural thickening or effusion.          Impression:        Low lung volumes. Right midlung scar versus plate  atelectasis. Otherwise clear lungs. No pneumothorax.        Heron Nay, MD   09/03/2016 4:54 PM              Current medications:     Scheduled Meds: PRN Meds:        amiodarone 200 mg Oral BID   amLODIPine 2.5 mg Oral Daily   aspirin EC 81 mg Oral Daily   atorvastatin 20 mg Oral QHS   clopidogrel 75 mg Oral Daily   docusate sodium 100 mg Oral BID   DULoxetine 60 mg Oral Daily   fosfoMYCIN 3 g Oral Q48H   heparin (porcine) 5,000 Units Subcutaneous Q12H SCH   insulin glargine 12 Units Subcutaneous QHS   metoprolol 25 mg Oral Q12H SCH   nystatin  Topical BID   sodium polystyrene 15 g Oral Once       Continuous Infusions:      acetaminophen 650 mg Q4H PRN   benzocaine-menthol 1 lozenge Q2H PRN   dextrose 15 g of glucose PRN   And     dextrose 125 mL PRN   And     glucagon (rDNA) 1 mg PRN   hydrALAZINE 25 mg Q6H PRN   insulin regular 1-4 Units QHS PRN   insulin regular 1-8 Units TID AC PRN   ondansetron 4 mg Q6H PRN   polyethylene glycol 17 g QD PRN   senna 17.2 mg QHS PRN   traMADol 50 mg Q6H PRN         Assessment:   80 y.o. female with  dysfunction of mobility/ ADL due to  left MCA stroke.  Cardiac debility.  Impaired balance  Partial amputation of the left foot   Urinary tract infection  Plan:  REHAB: Continue comprehensive and intensive inpatient rehab program, including  Physical therapy 60-120 min daily, 5-6 times per week   Occupational  therapy 60-120 min daily, 5-6 times per week   Speech therapy 60-120 min daily, 5-6 times per week   Case management   Rehabilitation nursing    Will continue to address the following impairments and issues:   mobility/ADL dysfunction due to left MCA stroke: PT, OT, speech  therapy.  Hyperkalemia:We will give 1 dose of Kayexalate and check potassium level in Green.m.  Cardiac debility, status post right thoracotomy for myxoma excision, and PFO closure: continue amiodarone until 09/05/16; daily dressing change,; the patient is to follow up with Thoracic surgeon tomorrow;  repeat chest x-ray tonight  Gram-negative urinary tract infection:  Patient is to receive 3 additional doses of fosfo mycin as outpatient;  the patient will probably need stent to be replaced    Secondary stroke prevention: Aspirin, Plavix    Hypertension: Controlled, systolic blood pressure-141-160;; continue Norvasc, metoprolol.  Partial amputation of the left foot  Pain : Tylenol  DVT ppx: Heparin    Coordination of care: Registered nurse      Discharge planning: Home, 09/04/16    Signed by: Earl Many DO     [facility]

## 2016-09-03 NOTE — Rehab Discharge Summary (Medilinks) (Signed)
[name]  MRN: [id number]  Account: [id number]  Session Start: 09/03/2016 11:00:00 AM  Session Stop: 09/03/2016 12:00:00 PM    Occupational Therapy  Inpatient Rehabilitation Discharge Summary and Note    Rehab Diagnosis: L MCA stroke  Demographics:            Age: 80Y            Gender: Female    Past Medical History: Arthritis  Depression  Hyperlipidemia  Hypertension  Kidney disorder  Type 2 diabetes mellitus  Urinary tract infections    SURGICAL HISTORY :  CABG 2010  Cystoscopy; ureteral stent 08/05/2016  Metatarsal amputation: 2015; three toes  L TKA 2010  History of Present Illness: 80  year old female with PMH HTN, hyperlipidemia,  Type 2 DM, CKD, partial L foot amputation, CAD s/p CABG  admitted [facility] 08/08/16  after family found her in her room struggling to put on her shirt and unable to  speak. MRI brain showed  acute left MCA distribution CVA. She was outside window  for lytics. Plavix started. Cardiac workup found large RA occupying mass and  possible interatrial shunt. 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  On 9/29 underwent cardiac cath and R thoracotomy for excision of axial  septal mass and PFO closure. Left heart cath 08/17/16 showed  3/4 2010 bypass  grafts patent with chronic occlusion of RCA.   Date of Onset: 08/08/16   Date of Admission: 08/25/2016 7:16:00 PM  Rehabilitation Precautions/Restrictions:   Falls  Aspiration  DM  HTN  3 toes on left foot amputated    SUBJECTIVE  Patient Report: "It is exciting" inregards to d/c tomorrow  Pain: Patient currently without complaints of pain.    OBJECTIVE  General Observation: Pt received sleeping supine in bed. Easily arousable and  agreeable to participate in tx.  Vitals:                       Before Activity              After Activity  Vitals  Time                 1110                         -  Position/Activity    seated                       -  BP Systolic          123                          -  BP Diastolic         68                            -  Pulse                66                           -    Cognition: Pt has flat affect and self reported poor motivation, likely related  to her depression. She demonstrates memory and word finding deficits. She  requires supervision for medication mgmt, and incontinence care, primarily  cueing  for timing and accuracy.  Perception: WFL    Functional Measures    EATING: Eating Score = 7. Patient is completely independent for eating.  There  are no activity limitations.    GROOMING: Grooming Score = 5. Patient is supervision/set-up for grooming,  requiring: Stand by assistance.  Setting out grooming equipment.  Patient requires the following assistive device(s) No assistive devices were  required.    BATHING: Patient bathed in shower. Bathing Score = 5.  Patient is  supervision/set-up for bathing, requiring: Standing by.  Setting out bathing equipment.  Patient requires the following assistive device(s): Grab bar/arm rest to  maintain balance.  Hand held shower.    UPPER BODY DRESSING: Upper Body Dressing Score = 5. Patient is  supervision/set-up for upper body dressing, requiring: Stand by assistance.  Gathering/setting out clothes.  Patient requires the following assistive device(s):  No assistive devices were  required.    LOWER BODY DRESSING: Lower Body Dressing Score = 5. Patient is  supervision/set-up for lower body dressing, requiring: Standing by.  Verbal cuing, prompting, or instructing.  Application of assistive/adaptive devices.  Gathering/setting out clothes.  Patient requires the following assistive device(s): No assistive devices were  required.    TOILETING: Toileting Score = 4.  Patient requires minimal assistance for  toileting, such as steadying for balance while cleansing or adjusting clothes.  Patient requires the following assistive device(s):  Adaptive device to maintain  balance.  Grab bar.    TRANSFER TOILET: Toilet Transfer Score = 5.  Patient is supervision/set-up  for  transferring to and from the toilet/commode, requiring: Stand by assistance.  Application of assistive/adaptive devices.  Patient requires the following assistive device(s):  Walker.    TRANSFER SHOWER: Shower Transfer Score = 4. Patient performs 75% or more of  effort and minimal assistance (little/incidental help/lifting of one  limb/steadying) for transferring to and from the shower, requiring: Contact  guard. Walker.  Grab bars.  Shower chair.    CARE Tool Discharge Performance  Self Care:   Eating: Patient completed the activities by him/herself with no assistance from  a helper.   Oral Hygiene: Patient completed the activities by him/herself with no  assistance from a helper.   Toileting Hygiene: : Helper provides verbal cues or touching/steadying  assistance as patient completes activity.   Shower/Bathe Self: Helper sets up or cleans up; patient completes activity.  Helper assists only prior to or following the activity.   Upper Body Dressing: Helper sets up or cleans up; patient completes activity.  Helper assists only prior to or following the activity.   Lower Body Dressing: Helper provides verbal cues or touching/steadying  assistance as patient completes activity.   Putting On/Taking Off Footwear: Helper sets up or cleans up; patient completes  activity. Helper assists only prior to or following the activity.  Mobility:   Statistician Helper provides verbal cues or touching/steadying assistance as  patient completes activity.    Today's Treatment Interventions:       Self Care/Home Management:  Pt seen for am ADL routine. Please refer to the  above for details on current LOF. Pt ambulated to/from bathroom with RW and SBA  for safety.  Patient was left seated in chair in his/her room. Handoff to nurse/nursing  Cytogeneticist completed.  Chair alarm in place and activated.  Oriented to call bell and placed within reach.  Personal items within reach.  Assistive devices positioned out of  reach.  Equipment Provided/Ordered: Recommend shower chair for  bathing    Education Provided:    Education Provided: Activities of daily living. Safety. Equipment.       Audience: Patient.       Mode: Explanation.  Demonstration.       Response: Verbalized understanding.  Demonstrated skill.    ASSESSMENT  Summary of Progress and Current Status: Ms. Moudy is a 80 yo F s/p L MCA CVA.  She has made significant progress t/o her inpatient rehab stay, progressing to  overall supervision/setup with ADL routine. She requires intermittent Min A/CGA  for toileting routine and incontinence mgmt due to balance deficits. One barrier  to progressing toward independence with incontinence care has been pt's  motivation to manage skin/brief checks q2 hrs, requiring prompting to do so. It  is recommended she have 24 hr Supervision/Assistance in order to promote the  above, and provide steadying assist as needed when mobilizing with RW or  performing toileting routine.    Long Term Goals (status prior to discharge): Ms. Hughston will improve standing  balance and perform LB dressing with Min A. - Goal Met  Ms. Xue will improve fxl endurance and perform full shower/dressing routine  with no more than Supervision. - Goal Not Met  Ms. Scroggins will improve safety and verbalize 3 controllable risk factors to  prevent secondary CVA. - Goal Not Met  Ms. Paschen's caregivers will complete caregiver training and demonstrate  competence in safely assisting/supervising pt prior to d/c. - Goal Not Met  7-10 days from IE on 08/26/16  Short Term Goals (status prior to discharge):    Progress Towards Goals (final status):   LONG TERM GOAL REVIEW:       1. Ms. Boggus will improve standing balance and perform LB dressing with  Min A. - Met       2. Ms. Gramajo will improve fxl endurance and perform full shower/dressing  routine with no more than Supervision. - Met        3. Ms. Arrazola will improve safety and verbalize 3 controllable  risk  factors to prevent secondary CVA. - Not Met can recall 2       4. Ms. Giorgio caregivers will complete caregiver training and  demonstrate competence in safely assisting/supervising pt prior to d/c. - Not  Met discussed supervision needs in family meeting and pt agreeable to provide    PLAN  Occupational Therapy Plan: Patient is recommended for home health therapy  services.    Team Care Plan  Please review Integrated Patient View Care Plan Flowsheet for Team identified  Problems, Interventions, and Goals.    Identified problems from team documentation:  Problem: Impaired Bladder Management  Bladder Mgmt: Primary Team Goal: Patient will be continent of bladder at least  75% of the time with timed tolieting/Active    Problem: Impaired Bowel Management  Bowel Mgmt: Primary Team Goal: Patient will be able sustain bowel continence  with timed toileting at least 75% of the time/Active    Problem: Impaired Communication  Communication: Primary Team Goal: Caregiver will demonstrate appropriate cueing  (i.e. probing questions) to address word finding difficutly in order to  facilitate patient's communicaiton of wants/needs in community./Active    Problem: Impaired Mobility  Mobility: Primary Team Goal: Patient will transfer from sitting to standing and  ambulate 50 feet with LRAD with mod I, ambulate 150 feet with LRAD with  supervision, and climb 12 steps with unilateral HR with CGA so patient can  maintain modified independence on one level and  access all levels of sons'  homes./Active    Problem: Impaired Nutrition / Hydration  Nutrition: Primary Team Goal: Maintain meal intake of 75% or more through  LOS/Active    Problem: Impaired Pain Management  Pain Mgmt: Primary Team Goal: Patient will verbalized pain less than 2 out of 10  with the use of medication and positioning techniques.Marland KitchenJorje Guild    Problem: Impaired Psychosocial Skills/Behavior  PsychoSocial: Primary Team Goal: Pt will demonstrate coping skills for  adjusting  to medical situation as reflected in maintaining positive expectations for being  able to continue to increase levels of functional independence/Active    Problem: Impaired Self-care Mgmt/ADL/IADL  Self Care: Primary Team Goal: Ms. Hoel will perform basic UB/LB dressing at  MOD I./Active    Problem: Safety Risk and Restraint  Safety: Primary Team Goal: Patient will recognize limitations and call for  assistance before attempting mobility 100% of the time in order to prevent  fall./Active    Status update for discharge:     Self Care Management:   Primary Goal: Not Met  Discharge Status Comment:   Ms. Chiquito requires intermittent CGA during ADL  routine/mobility for safety'    3 Hour Rule Minutes: 60 minutes of OT treatment this session count towards  intensity and duration of therapy requirement. Patient was seen for the full  scheduled time of OT treatment this session.  Therapy Mode Minutes: Individual: 60 minutes.    Signed by: Melinda Crutch, OTR/L 09/03/2016 12:00:00 PM

## 2016-09-03 NOTE — Rehab Discharge Instruction (Medilinks) (Signed)
Glenda Green  MRN: [id number]  Account: [id number]  Session Start: 09/03/2016 12:00:00 AM  Session Stop: 09/03/2016 12:00:00 AM    Case Management  Inpatient Rehabilitation Discharge Instructions    Discharge Date: Friday, September 04, 2016  Transportation: Family to transport by private vehicle.  Discharge Plan: Home with home health services      By [employer], the patient has the right to freely choose a home care provider.  Arrangements have been made with:    ? A company of the patients choosing. We have supplied the patient with a  listing of providers in your area who asked to be included and participate in  Medicare.  ? [facility], a home care agency that provides both adult home care  services which is a wholly owned and operated by [employer] and  participates in [place]  ? The preferred provider of your insurance company. Choosing a home care  provider other than your insurance company's preferred provider may affect your  insurance coverage.    The Home Health Care Referral Form acknowledging the voluntary selection of the  home care company has been completed, signed, and is on file.    Home Health Discharge Information    Your doctor has ordered Skilled Nursing, Physical Therapy, Occupational Therapy  and Speech and Language Therapy in-home service(s) for you while you recuperate  at home, to assist you in the transition from hospital to home.    The agency that you or your representative chose to provide the service:  Name of Home Health Agency: [facility] [phone]    The above services were set up by:  Reola Mosher [facility] Liaison)   Phone: [phone]                          FOLLOW UP APPOINTMENTS:  We understand that you will transition to your home in [place]. Listed  below are the providers consulted during your hospital and acute rehab  admissions, for your reference, as you identify specialty care providers near  your home.    Boneta Lucks, NP - Primary Care  Schedule follow up within one (1) week post discharge.  [address]  [place], [place] [address]  [phone]    Christene Lye, MD - Thoracic and Cardiac Surgery, Surgery, Surgical Critical  Care  [facility]  [address]  [place] [address]  Phone: [phone]  Fax: [phone]    Jonetta Speak, MD - Cardiology  [facility]  [address]  [place] [address]  Phone: [phone]  Fax: [phone]    Tescher, Hoyle Sauer, MD - Urology  [facility]  [address]  [address]  [place] [address]  Phone: [phone]  Fax: [phone]    Jannet Mantis, MD - Neurology  [facility] - Neurology II  [address]  [address]  [place] [address]  Phone: [phone]  Fax: [phone]          Troy Sine, MD - Nephrology  [facility]  [address]  [address]  [place] [address]  Phone: [phone]  Fax: [phone]    Clyde Canterbury, MD - Endocrinology  [facility]  [address]  Department of Medicine  [place] [address]  Phone: [phone]  Fax: [phone]    Earl Many, DO - Brain Injury/Physical Medicine and Rehabilitation  [facility]  [address] [place] [facility]  [place] [address]  Phone: [phone]  Fax: [phone]      Contact information for your acute rehab case manager is included in your  binder, for your reference.    Signed by: Arlie Solomons, MSW 09/03/2016 12:21:00 PM

## 2016-09-04 ENCOUNTER — Other Ambulatory Visit: Payer: Self-pay

## 2016-09-04 LAB — POTASSIUM: Potassium: 4.8 mEq/L (ref 3.5–5.1)

## 2016-09-04 LAB — GLUCOSE WHOLE BLOOD - POCT: Whole Blood Glucose POCT: 119 mg/dL — ABNORMAL HIGH (ref 70–100)

## 2016-09-04 MED FILL — Amiodarone HCl Tab 200 MG: ORAL | 30 days supply | Qty: 60 | Fill #0 | Status: CN

## 2016-09-04 MED FILL — Atorvastatin Calcium Tab 20 MG (Base Equivalent): ORAL | 30 days supply | Qty: 30 | Fill #0 | Status: CN

## 2016-09-04 MED FILL — Metoprolol Tartrate Tab 25 MG: ORAL | 30 days supply | Qty: 60 | Fill #0 | Status: CN

## 2016-09-04 MED FILL — Clopidogrel Bisulfate Tab 75 MG (Base Equiv): ORAL | 30 days supply | Qty: 30 | Fill #0 | Status: CN

## 2016-09-04 MED FILL — Fosfomycin Tromethamine Powd Pack 3 GM (Base Equivalent): ORAL | 6 days supply | Qty: 3 | Fill #0 | Status: CN

## 2016-09-04 MED FILL — Amlodipine Besylate Tab 2.5 MG (Base Equivalent): ORAL | 30 days supply | Qty: 30 | Fill #0 | Status: CN

## 2016-09-04 MED FILL — Insulin Glargine Soln Pen-Injector 100 Unit/ML: SUBCUTANEOUS | 25 days supply | Qty: 3 | Fill #0 | Status: CN

## 2016-09-04 NOTE — Progress Notes (Signed)
Infectious Diseases  Progress Note     Impression:   80 year old female with diabetes, obstructive uropathy, relieved by stent placement on August 05, 2016, with a negative urine culture at that time. She has no GU Sx but with significant pyuria and >100K MDR E coli on Cx she has been a candidate for Tx. I am concerned about the possibility that the stent is colonized with this MDR E. Coli. She remains afebrile, nontoxic, and she has a normal white count. He r f/u UCx are negative.  I d/w her Urologist, Apolinar Junes, at [facility] he has not established a long term plan for her stent and the Pt is going back to [place] for her care.   No GU Sx.  D/W family IC issues    Recommendations:   Continue Monurol every other day.  Stay on Monurol through an outpt stent change next week. .  D/W family.    Subjective:   No new ID problems noted    Review of Systems  Patient without vomiting, diarrhea, rash, cough, shortness of breath, abdominal or chest pain.      Objective:   BP 130/78   Pulse 74   Temp 98.4 F (36.9 C) (Oral)   Resp 18   Ht 1.626 m (5\' 4" )   Wt 66.6 kg (146 lb 13.2 oz)   SpO2 98%   BMI 25.20 kg/m     Temp (24hrs), Avg:97.9 F (36.6 C), Min:97.3 F (36.3 C), Max:98.4 F (36.9 C)      General: awake, alert,  no acute distress.    Labs:    Recent Labs  Lab 09/03/16  0533 09/02/16  0546 08/31/16  0523   WBC 14.02* 14.87* 13.14*   Hgb 10.6* 10.9* 10.3*   Hematocrit 32.8* 34.1* 31.9*   Platelets 374 377 369       Recent Labs  Lab 09/04/16  0507 09/03/16  0533 09/02/16  0546 08/31/16  0523   Sodium  --   --  136 137   Potassium 4.8 5.4* 5.4* 4.5   Chloride  --   --  105 105   CO2  --   --  24 22   BUN  --   --  25* 29*   Creatinine  --   --  1.7* 1.9*   Calcium  --   --  9.0 8.5   Glucose  --   --  114* 110*

## 2016-09-04 NOTE — Rehab Discharge Summary ([facility]) (Signed)
Glenda Green  MRN: [id number]  Account: [id number]  Session Start: 09/03/2016 12:00:00 AM  Session Stop: 09/03/2016 12:00:00 AM    Rehabilitation Nursing  Inpatient Rehabilitation Discharge Summary    Rehab Diagnosis: L MCA stroke  Demographics:            Age: 80Y            Gender: Female  Primary Language: English    Date of Onset:  08/08/16  Date of Admission: 08/25/2016 7:16:00 PM    Rehabilitation Precautions Restrictions:   Falls  Aspiration  DM  HTN  3 toes on left foot amputated    Discharge:  Patient discharged to:   Home  At discharge, the patient was discharged to live (with):  Family / Relatives.  Follow up providers include: son and daughter in law    VITAL SIGNS  Blood Pressure: 141/73 mmHg  Temperature: 36.27 degrees  Pulse: 65 beats per minute  Respirations: 18 breaths per minute  Pain: 5/10    WEIGHT and NUTRITION  Admission Weight: 164 pounds; Current Weight: 164pounds  Weight Change since Admit: Patient has had no weight change since admission.  Food Consistency: Regular  Liquid Consistency:Thin    Patient Report: "I am ready to go home, i feel good. what time my discharge"  Pain: Patient currently without complaints of pain.  Pain Reassessment: Pain was not reassessed as no pain was reported.  Patient/Caregiver Goals:  "To be able to walk without any device".    SEVERE SEPSIS SCREEN  INFECTION:  Patient has no indication of infection.  Negative Sepsis Screen.  If you are unable to assess a system's dysfunction because you do not have labs,  or the labs you have are not current (within 24 hours), call physician and  request and order for the lab tests needed.    NEURO  Orientation/Awareness: Alert and Oriented x4.  Speech: No deficits noted at this time.  Clear.  Behavior: Cooperative.    MEDICATIONS  IV Access: No IV access.  Dialysis Access: Patient does not have dialysis access.    Elopement Risk Level Assessment Tool  Patient Criteria: Patient is not capable of leaving the unit.   Assessment is not  applicable.    RISK ASSESSMENT FOR FALLS/INJURY    MENTAL STATUS CRITERIA:   0 - None identified.  MENTAL STATUS TOTAL: 0    AGE CRITERIA:   78 - 78 years old and above  AGE TOTAL: 2    ELIMINATION CRITERIA:   3 - Toileting with Assistance.   5 - Incontinence.   10 - Urgency.   ELIMINATION TOTAL: 18    HISTORY OF FALLS CRITERIA:   2 - Unknown History.  HISTORY OF FALLS TOTAL: 2    MEDICATIONS CRITERIA:   2 or more High Risk Medications (*see list below)   MEDICATIONS TOTAL: 2    PHYSICAL MOBILITY CRITERIA:   3 - Decreased balance reaction.   1 - Weakness/impaired physical mobility.   PHYSICAL MOBILITY TOTAL: 4    FALLS RISK ASSESSMENT TOTAL: 28    Patient's Fall Risk: No Risk Level found for this score.    Falls Interventions: Yellow fall risk marker applied to patient identification  band  Oriented to call bell and placed within reach.  Personal items within reach.  Hourly toileting/safety checks between 6am and 10pm, then every 2 hours.  Bed alarm in place and activated.  Chair alarm in place and activated.  Initiate fall care plan and  outcome.  Clutter removed and clear path to BR.  Patient and family education.  Family at bedside.    NUTRITION  Diet:  Type: Consistent carbohydrate. Regular.  Food Consistency: Regular.  Liquid Consistency: Thin.    CARDIOVASCULAR     Bilateral lower extremities  Nail Bed Color: Pink.   No edema or redness present.  Pulses:   Apical Pulse: Regular. Strong. Rate is 68 .   Patient does not have a pacemaker.   Patient does not have a defibrillator.    CARDIOPULMONARY  Lung Sounds:   Upper lobes. Clear.   Lower lobes. Clear.  Type of Respirations: Regular.  Cough: No cough noted.  Respiratory Support: The patient does not require any respiratory support.  Respiratory Equipment: None. 97% RA    INTEGUMENTARY  Skin:  Temperature: Warm  Turgor: Normal for age  Moisture: Dry  Color of skin: Normal for Race/Ethnicity  Capillary Refill: Less than 3  seconds  Wounds/Incisions:     Surgical Incision: right breast fold incision incision Length: 11 centimeter(s)  gauze and tegerderm dressing No signs of infection.  Drainage: Incision without drainage.  Odor:  No  Incision Care: Per protocol.  Braden Scale for Predicting Pressure Sore Risk: Sensory Perception: No  impairment  Moisture: Very moist  Activity: Walks occasionally  Mobility: Slightly limited  Nutrition: Adequate  Friction and Shear: Potential problem  Braden Score: 17  Level of Risk: At risk (15-18)   Assist patient to the bathroom every 2 hours   Assist patient to the bathroom every 2 hours    GASTROINTESTINAL  Abdomen: Soft. Nontender.  Bowel Sounds:  Active bowel sounds audible in all four quadrants.  Date of Last Bowel Movement:  09/03/16  Current Bowel Pattern:  Incontinent   No Problems/Complaints with Bowel Elimination Assessed.    GENITOURINARY  Current Bladder Pattern: Incontinent Stress incontinence only.  Color:  Yellow   Patient denies problems with urination and/or catheter.    MUSCULOSKELETAL  Upper Body: Right hand swollen and weak  Lower Body: right leg weakness    Functional Measures       Bladder Score = 5.  Patient receives assist with set-up or emptying of device  for bladder management or cuing with device(s), requiring:  Q2H Bathroom  training.  Patient requires the following assistive device(s): Adult brief.  Absorbent pads.    BLADDER ACCIDENTS THIS SHIFT:  0 . Patient has not had an accident but used an  adult brief this assessment.     Bowel Score = 5.  Patient is supervision/set-up for bowel management,  requiring: Verbal cuing, prompting, or instructing.  Patient requires the following assistive device(s): Adult brief.    BOWEL ACCIDENTS THIS SHIFT: 0 . Patient has not had an accident, but used an  adult brief.    Education Provided:    Education Provided: Safety issues and interventions. Fall protocol. Use of  adaptive devices. Safety. Safety. Plan of care. Bowel and bladder  programs.  Bladder training program. Bowel Training program.       Audience: Patient.       Mode: Explanation.  Demonstration.       Response: Applied knowledge.  Verbalized understanding.  Demonstrated skill.    ASSESSMENT  Long Term Goals (status prior to discharge): 1. Patient will be continent with  bladder 100% most of the time. - New Goal   2. Patient will remain continent of bowel until discharge.   3. Patient will recognize limitations and call for  assistance before attempting  mobilty 100% of the time in order to prevent falls.   4. Surgical wound will be clean with no drainage to minimize risk of infection.   5. Patient will verbalized pain level 2/10 after giving medication and  repositioning techniques.  2 weeks after 08/25/2016  Short Term Goals (status prior to discharge): 1. Patient will call for  assistance 75% of the time before attempting transfer to prevent falls during  hospitalization. - Goal Met   2. Patient will verbalize pain  less than 3 out of 10 with or without  medication.   3. Patient identifies correct drug indications, dosage, administration times  and side effects 75% on the medications.  1 week after 08/25/2016    Progress Towards Goals (final status):    PLAN  Recommendations for Follow-Up Care:   Yes, patient received valuables.  Bladder Program:  Bowel Program:  Last Bowel Movement- 09/03/16   q2h bathroom training while awake  Skin: -apply z guard to perineal area with incontinent care  -change right  breast dressing with gauze and tegerderm daily  Current Diet: consistent carbohydrate  Pain Management: Relaxation. -apply z guard to perineal area with incontinent  care  -change right breast dressing with gauze and tegerderm daily  -accucheck  before meals and at bedtime    Care Plan  Identified problems from team documentation:  Problem: Impaired Bladder Management  Bladder Mgmt: Primary Team Goal: Patient will be continent of bladder at least  75% of the time with timed  tolieting/Active    Problem: Impaired Bowel Management  Bowel Mgmt: Primary Team Goal: Patient will be able sustain bowel continence  with timed toileting at least 75% of the time/Active    Problem: Impaired Communication  Communication: Primary Team Goal: Caregiver will demonstrate appropriate cueing  (i.e. probing questions) to address word finding difficutly in order to  facilitate patient's communicaiton of wants/needs in community./Met    Problem: Impaired Mobility  Mobility: Primary Team Goal: Patient will transfer from sitting to standing and  ambulate 50 feet with LRAD with mod I, ambulate 150 feet with LRAD with  supervision, and climb 12 steps with unilateral HR with CGA so patient can  maintain modified independence on one level and access all levels of sons'  homes./Not Met    Problem: Impaired Nutrition / Hydration  Nutrition: Primary Team Goal: Maintain meal intake of 75% or more through  LOS/Active    Problem: Impaired Pain Management  Pain Mgmt: Primary Team Goal: Patient will verbalized pain less than 2 out of 10  with the use of medication and positioning techniques.Marland KitchenJorje Guild    Problem: Impaired Psychosocial Skills/Behavior  PsychoSocial: Primary Team Goal: Pt will demonstrate coping skills for adjusting  to medical situation as reflected in maintaining positive expectations for being  able to continue to increase levels of functional independence/Active    Problem: Impaired Self-care Mgmt/ADL/IADL  Self Care: Primary Team Goal: Ms. Bacchi will perform basic UB/LB dressing at  MOD I./Not Met    Problem: Safety Risk and Restraint  Safety: Primary Team Goal: Patient will recognize limitations and call for  assistance before attempting mobility 100% of the time in order to prevent  fall./Active    Status update for discharge:  Branch    Please review Integrated Patient View Care Plan Flowsheet for Team identified  Problems, Interventions, and Goals.    Signed by: Sherlynn Carbon, RN 09/03/2016 10:24:00  PM

## 2016-09-04 NOTE — Discharge Summary (Signed)
REHABILITATION MEDICINE DISCHARGE SUMMARY    Patient Identification  Glenda Green is a 80 y.o. female.  DOB:  02/25/1936    Date of admission: 08/25/2016    Date of discharge: 09/04/16    Attending Provider:   Earl Many     Discharge Physician:  Earl Many    Admission Diagnoses: Cerebrovascular accident (CVA) [I63.9]  HPI:  This 80 y.o. year old female with history of hypertension, hyperlipidemia, type 2 diabetes mellitus, chronic kidney disease, partial left foot amputation in 2015, coronary artery disease, coronary artery bypass graft 2010 was admitted to [facility] on 08/08/16 with complains of difficulty speaking.  Brain MRI showed acute left MCA stroke.  Plavix was started by neurology.   Hospital course was notable for  -.  Right axial myxoma: The patient underwent cardiac catheterization and right thoracotomy for axial septal mass excision, and PF0 closure on on 08/21/16.  Cardiology recommended to continue amiodarone for 11 days and stop on 09/05/16  -Urinary tract infection: The patient completed course of IV ceftriaxone.  -  Obstructive uropathy, status post ureteric stenting in September 2017.  Heparin was started for deep vein thrombosis prophylaxis  On 08/25/16   , the patient was deemed medically stable to be discharged to acute inpatient rehabilitation.     Hospital Course:  The patient underwent comprehensive rehabilitation including   Physical therapy 60-120 min daily, 5-6 times per week   Occupational therapy 60-120 min daily, 5-6 times per week   Speech therapy 60-120 min daily, 5-6 times per week   Case management   Rehabilitation nursing    The patient was followed by Dr. Rebecca Eaton, infectious disease    The following issues were addressed:  mobility/ADL dysfunction due to left MCA stroke: PT, OT, speech therapy.  Hyperkalemia: Resolved  Cardiac debility, status post right thoracotomy for myxoma excision, and PFOclosure:  continue amiodarone until 09/05/16; daily dressing change,; the patient is to follow up with Thoracic; chest x-ray was repeated  Gram-negative urinary tract infection:  Patient is to receive 3 additional doses of fosfo mycin as outpatient;  the patient will probably need stent to be replaced; the patient is going back to [place] for her care  Secondary stroke prevention: Aspirin, Plavix  Hypertension: Controlled, systolic blood pressure-130s; continue Norvasc, metoprolol.  Partial amputation of the left foot  Pain :Tylenol  DVT ppx: Heparin    Discharge Exam:  Vitals:    09/03/16 1357 09/03/16 1655 09/03/16 2116 09/04/16 0552   BP: 164/74 141/73 138/75 130/78   Pulse: 72 65 68 74   Resp:  18  18   Temp:  97.3 F (36.3 C)  98.4 F (36.9 C)   TempSrc:  Oral  Oral   SpO2:  97%  98%   Weight:       Height:       General appearance: . In no acute distress. Normal body habitus.  ZO:XWRUE rate and rhythm are regular. No significant lower limb edema.  Pulm: Clear to auscultation bilaterally.  No wheezes, rales, or rhonchi.  AVW:UJWJ. Non-tender. Normoactive bowel sounds.  Skin:Right lateral thoracic Incision, resolving minimal erythema, no drainage  Neuro:Alert.  Hearing intact bilaterally to conversational speech.   .Sensation to LT is intact  Musculoskeletal:   ROM-within functional limits    Manual muscle strength testing:  Muscle group Left Right   Shoulder abductors 5 3-   Elbow flexors 5 3   Elbow extensors 5 4   Finger flexors 5 4  Hip flexors 4 4   Knee flexors 5 4   Knee extensors 5 4   Ankle dorsiflexors 5 5   Ankle plantarflexors 5 5   EHL 5 5     Extremities: Left foot: Amputation of the 1-3 toes      Labs:    BMP:  Recent Labs      09/04/16   0507  09/03/16   0533  09/02/16   0546   Sodium   --    --   136   Potassium  4.8  5.4*  5.4*   Chloride   --    --   105   CO2   --    --   24   BUN   --    --   25*   Creatinine   --    --   1.7*   Glucose   --    --   114*   Calcium   --    --    9.0     Lab 09/03/16  0533 09/02/16  0546   WBC 14.02* 14.87*   Hgb 10.6* 10.9*   Hematocrit 32.8* 34.1*   Platelets 374 377          Recent Labs  Lab 09/03/16  0533 09/02/16  0546 08/31/16  0523   Sodium  --  136 137   Potassium 5.4* 5.4* 4.5   Chloride  --  105 105   CO2  --  24 22   BUN  --  25* 29*   Creatinine  --  1.7* 1.9*   Calcium  --  9.0 8.5   Glucose  --  114* 110*     Urine analysis: Pyuria  Urine culture: Gram-negative rods more than 100,000 colonies           Rads:   Venous ultrasound, 08/26/16-no deep vein thrombosis in bilateral lower extremities  Rads:   Radiological Procedure reviewed.          Radiology Results (24 Hour)     Procedure Component Value Units Date/Time    XR Chest 2 Views [[id number]] Collected:  09/03/16 1653    Order Status:  Completed Updated:  09/03/16 1659    Narrative:        CLINICAL INDICATION: cough    COMPARISON: 08/23/2016    FINDINGS:   2 views of the chest were obtained. The chest tube has been  removed. Lung volumes low. Right midlung scar versus plate atelectasis  noted. Hiatal hernia opacifies the midline retrocardiac region. No  airspace infiltrate. No pneumothorax. The heart and vascularity are  within normal limits. There is no pleural thickening or effusion.        Impression:        Low lung volumes. Right midlung scar versus plate  atelectasis. Otherwise clear lungs. No pneumothorax.        Heron Nay, MD   09/03/2016 4:54 PM          Estimated Creatinine Clearance: 25.6 mL/min (based on SCr of 1.7 mg/dL).    LFTs:  No results for input(s): AST, ALT, ALKPHOS, BILITOTAL, PROT, ALB in the last 72 hours.      CBC:  Recent Labs      09/03/16   0533  09/02/16   0546   WBC  14.02*  14.87*   Hgb  10.6*  10.9*   Hematocrit  32.8*  34.1*   MCV  88.6  88.6  Platelets  374  377     Rads:   Radiological Procedure reviewed.  Radiology Results (24 Hour)     Procedure Component Value Units Date/Time    XR Chest 2 Views [[id number]] Collected:  09/03/16 1653     Order Status:  Completed Updated:  09/03/16 1659    Narrative:        CLINICAL INDICATION: cough    COMPARISON: 08/23/2016    FINDINGS:   2 views of the chest were obtained. The chest tube has been  removed. Lung volumes low. Right midlung scar versus plate atelectasis  noted. Hiatal hernia opacifies the midline retrocardiac region. No  airspace infiltrate. No pneumothorax. The heart and vascularity are  within normal limits. There is no pleural thickening or effusion.          Impression:        Low lung volumes. Right midlung scar versus plate  atelectasis. Otherwise clear lungs. No pneumothorax.        Heron Nay, MD   09/03/2016 4:54 PM                Discharge Functional Status:      TRANSFERS BED/CHAIR/WHEELCHAIR: Bed/chair/wheelchair Transfer Score = 5.  Patient is supervision/set-up for transferring to and from the  bed/chair/wheelchair, requiring: Stand by assistance.  Verbal cuing, prompting, or instructing.  Set up (positioning equipment, lock brakes and/or adjust foot rest).  Patient requires the following assistive device(s): Walker.  Arm rest.    LOCOMOTION WHEELCHAIR:  Wheelchair did not occur. Patient will not have w/c to use after d/c from  rehab, so w/c mobility is not assessed at this time.    LOCOMOTION WALK:  Walk Distance Scale = 3. Distance walked is greater than 150 feet. Walk Score  = 5. Patient requires supervision or set up for walking. Patient walked a  distance of 150 feet. Rolling walker. Patient is able to ambulate short  distances (50 feet or less) with the RW with mod I, although supervision is  still recommended initially after d/c from rehab, due to impaired dynamic  standing balance.    LOCOMOTION STAIRS: Stairs Score = 4. Incidental help/contact guard/steadying  was provided. Patient performs 75% or more of effort and requires minimal  assistance. Patient negotiated 12 stairs.  Discharge Medication List as of 09/04/2016  9:07 AM      START taking these  medications    Details   docusate sodium (COLACE) 100 MG capsule Take 1 capsule (100 mg total) by mouth 2 (two) times daily., Starting Thu 09/03/2016, Print      insulin glargine 100 UNIT/ML injection pen Inject 12 Units into the skin nightly., Starting Thu 09/03/2016, Print      insulin regular (HUMULIN R,NOVOLIN R) 100 UNIT/ML injection Inject 1-4 Units into the skin nightly as needed (See admin instructions)., Starting Thu 09/03/2016, Print      senna (SENOKOT) 8.6 MG Tab Take 2 tablets (17.2 mg total) by mouth nightly as needed (constipation)., Starting Thu 09/03/2016, Print      traMADol (ULTRAM) 50 MG tablet Take 1 tablet (50 mg total) by mouth every 6 (six) hours as needed for Pain., Starting Thu 09/03/2016, Print         CONTINUE these medications which have CHANGED    Details   acetaminophen (TYLENOL) 325 MG tablet Take 2 tablets (650 mg total) by mouth every 4 (four) hours as needed for Pain., Starting Thu 09/03/2016, Print      amiodarone (PACERONE)  200 MG tablet Take 1 tablet (200 mg total) by mouth 2 (two) times daily., Starting Thu 09/03/2016, Print      amLODIPine (NORVASC) 2.5 MG tablet Take 1 tablet (2.5 mg total) by mouth daily., Starting Fri 09/04/2016, Print      aspirin EC 81 MG EC tablet Take 1 tablet (81 mg total) by mouth daily., Starting Fri 09/04/2016, Print      atorvastatin (LIPITOR) 20 MG tablet Take 1 tablet (20 mg total) by mouth nightly., Starting Thu 09/03/2016, Print      clopidogrel (PLAVIX) 75 mg tablet Take 1 tablet (75 mg total) by mouth daily., Starting Fri 09/04/2016, Print      DULoxetine (CYMBALTA) 60 MG capsule Take 1 capsule (60 mg total) by mouth daily., Starting Fri 09/04/2016, Print      metoprolol (LOPRESSOR) 25 MG tablet Take 1 tablet (25 mg total) by mouth every 12 (twelve) hours., Starting Thu 09/03/2016, Print         STOP taking these medications       benzocaine-menthol (CEPACOL) 15-3.6 MG Lozenge lozenge Comments:   Reason for Stopping:         ferrous sulfate  325 (65 FE) MG tablet Comments:   Reason for Stopping:         fosfoMYCIN (MONUROL) 3 g Pack Comments:   Reason for Stopping:         insulin glargine (LANTUS) 100 UNIT/ML injection Comments:   Reason for Stopping:         insulin lispro (HUMALOG) 100 UNIT/ML injection Comments:   Reason for Stopping:         insulin lispro (HUMALOG) 100 UNIT/ML injection Comments:   Reason for Stopping:         insulin lispro (HUMALOG) 100 UNIT/ML injection Comments:   Reason for Stopping:         polyethylene glycol (MIRALAX) packet Comments:   Reason for Stopping:         Probiotic Product (PROBIOTIC ADVANCED PO) Comments:   Reason for Stopping:         senna-docusate (PERICOLACE) 8.6-50 MG per tablet Comments:   Reason for Stopping:               Discharge diagnoses:   80 y.o. female with  dysfunction of mobility/ ADL due to left MCA stroke.  Cardiac debility.  Impaired balance  Partial amputation of the left foot   Urinary tract infection  Discharge location:Home with assistance    Discharge instructions:  Refrain from driving pending further evaluation by physician and/or OT.  Follow-up with primary care physician within 2 weeks.  Follow-up with neurologist within 1 month.  Follow-up with cardiologist within 2 weeks.  Refer to case management discharge summary for additional follow-up information.  We understand that you will transition to your home  in [place]. Listed below are the providers consulted during your hospital  and acute rehab admissions, for your reference, as you identify specialty care  providers near your home.    Boneta Lucks, NP - Primary Care  Schedule follow up within one (1) week post discharge.  [address]  [address]  [place], [place] [address]  [phone]    Christene Lye, MD - Thoracic and Cardiac Surgery, Surgery, Surgical Critical  Care  [facility]  [address]  [place] [address]  Phone: [phone]  Fax: [phone]    Jonetta Speak, MD -  Cardiology  [facility] -  [place] [facility]  [address]  [place] [address]  Phone: [phone]  Fax: [phone]    Tescher, Hoyle Sauer, MD - Urology  [facility]  [address]  [address]  [place] [address]  Phone: [phone]  Fax: [phone]    Jannet Mantis, MD - Neurology  [facility] - Neurology II  [address]  [address]  [place] [address]  Phone: [phone]  Fax: [phone]          Troy Sine, MD - Nephrology  [facility]  [address]  [address]  [place] [address]  Phone: [phone]  Fax: [phone]    Clyde Canterbury, MD - Endocrinology  [facility]  [address]  Department of Medicine  [place] [address]  Phone: [phone]  Fax: [phone]  Rehabilitation services required:  home health services    Discharge medications:      Glenda Green, Glenda Green   Home Medication Instructions [id number]    Printed on:09/04/16 1614   Medication Information                      acetaminophen (TYLENOL) 325 MG tablet  Take 2 tablets (650 mg total) by mouth every 4 (four) hours as needed for Pain.             amiodarone (PACERONE) 200 MG tablet  Take 1 tablet (200 mg total) by mouth 2 (two) times daily.             amLODIPine (NORVASC) 2.5 MG tablet  Take 1 tablet (2.5 mg total) by mouth daily.             aspirin EC 81 MG EC tablet  Take 1 tablet (81 mg total) by mouth daily.             atorvastatin (LIPITOR) 20 MG tablet  Take 1 tablet (20 mg total) by mouth nightly.             clopidogrel (PLAVIX) 75 mg tablet  Take 1 tablet (75 mg total) by mouth daily.             docusate sodium (COLACE) 100 MG capsule  Take 1 capsule (100 mg total) by mouth 2 (two) times daily.             DULoxetine (CYMBALTA) 60 MG capsule  Take 1 capsule (60 mg total) by mouth daily.             insulin glargine 100 UNIT/ML injection pen  Inject 12 Units into the skin nightly.             insulin regular  (HUMULIN R,NOVOLIN R) 100 UNIT/ML injection  Inject 1-4 Units into the skin nightly as needed (See admin instructions).             metoprolol (LOPRESSOR) 25 MG tablet  Take 1 tablet (25 mg total) by mouth every 12 (twelve) hours.             senna (SENOKOT) 8.6 MG Tab  Take 2 tablets (17.2 mg total) by mouth nightly as needed (constipation).             traMADol (ULTRAM) 50 MG tablet  Take 1 tablet (50 mg total) by mouth every 6 (six) hours as needed for Pain.  CC: No primary care provider on file.                                Discharge planning, preparation,  coordination, and face to face discussion with the patient  took more than  35 minutes     Hendricks Limes, DO

## 2016-09-04 NOTE — Rehab Discharge Instruction ([facility]) (Signed)
Glenda Green  MRN: [id number]  Account: [id number]  Session Start: 09/03/2016 12:00:00 AM  Session Stop: 09/03/2016 12:00:00 AM    Rehabilitation Nursing  Inpatient Rehabilitation Discharge Instructions    Discharge Date: 09/04/16    Transportation: Patient's Mode of Transportation: Dance movement psychotherapist  Wheelchair  Accompanied By:  Family. son    Recommendations for Follow-Up Care:   Yes, patient received valuables.  Bladder Program:  Bowel Program:  Last Bowel Movement- 09/03/16    Skin: Change right breast dressing with gauze and tegerderm.  Current Diet: Apply zguard to sacrum with bathroom care.  Pain Management: Medication.    VITAL SIGNS  Blood Pressure:   mmHg  Temperature: 36.27  degrees  Pulse: 65  beats per minute  Respirations: 18  breaths per minute  Pain: 5/10    [place] Medications: Refer to separate medications list provided.        Please follow the home exercise programs provided by your therapists in addition  to the following recommendations.  Stop activity if shortness of breath or chest  pain occurs.    Call your physician if you experience excessive pain, fever, or other concerns.    PRECAUTIONS    A.  Don't move patient using affected arm or leg.  B.  Keep emergency numbers by the phone.  [place] brakes if using a wheelchair.  Move wheelchair foot rest prior to  standing.  D.  Remove obstacles from the home (throw rugs).  E.  Follow prescribed instructions.  F.  If you fall, call 911 for assistance.  G.  If you are on home oxygen then ensure that smoke detectors are present and  properly functioning. No smoking when oxygen is in use. Wear only non-flammable  clothing when oxygen is in use.    When standing from a bed or chair, push off from bed/chair. Do not pull on  walker or helper. If using a wheelchair, always lock the brakes and remove leg  rests before getting in/out of wheelchair.    No driving until cleared by your primary care physician.      MEDICATION USE  GUIDELINES    1. Become familiar with the names of your medication(s) and their appearance.  2. Know why you are taking each medication, what undesirable effects you may  expect, and when to contact your physician.  3. Consult with a pharmacist and /or your physician before using any over the  counter (OTC) medications.  4. Be compliant with your medication schedule.  If you feel you cannot, then  discuss alternatives with your physician.  5. If you are taking Enteric coated or extended/delayed release product(s), then  never crush, chew, open, cut, break or destroy by any means because serious  effects can occur.  6. When storing your medications, avoid hot and humid places (i.e., kitchen,  bathrooms).  7. Select a safe place out of children's reach to store medications.  Follow any  other special instructions.  8. Inform your doctors of all the medications you are taking, including those  you buy without a prescription.  9. Use the same pharmacy to buy your prescriptions whenever possible.    Signed by: Sherlynn Carbon, RN 09/03/2016 10:15:00 PM

## 2016-09-10 NOTE — Rehab PPS CMG ([facility]) (Signed)
[name]  MRN: [id number]  Account: [id number]  Session Start: 09/04/2016 12:00:00 AM  Session Stop: 09/04/2016 12:00:00 AM    PPS CMG Coordinator  Inpatient Rehabilitation Discharge    Mode of Locomotion: Walking.    Discharge Against Medical Advice:  No.  Discharge Information: Patient Discharged Alive:  Yes  Discharge Destination/Living Setting: Home with Home Health Services      Impairment Group: Stroke: 01.4 No Paresis    Comorbidities:  Rank Code      Description    1    E78.5     Hyperlipidemia, unspecified  2    E11.65    Type 2 diabetes mellitus with hyperglycemia  3    N39.0     Urinary tract infection, site not specified  4    B96.20    Unspecified Escherichia coli [E. coli] as the                 cause of diseases classified elsewhere  5    D15.1     Benign neoplasm of heart  6    E11.22    Type 2 diabetes mellitus with diabetic chronic                 kidney disease  7    E87.5     Hyperkalemia  8    N18.3     Chronic kidney disease, stage 3 (moderate)  9    I12.9     Hypertensive chronic kidney disease with stage                 1 through stage 4 chronic kidney disease, or                 unspecified chronic kidney disease  10   I25.10    Atherosclerotic heart disease of native                 coronary artery without angina pectoris  11   Z95.1     Presence of aortocoronary bypass graft  12   Z89.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 coli] as the                 cause of diseases classified elsewhere  4    E87.5     Hyperkalemia      ********************************  PAI Bladder  Accidents: 1  - Accidents.  Patient used medications/device this  shift.  09/03/2016 10:24:00 PM  Bladder Score = 5.  One (1) bladder accident.  PAI Bowel Accident: 1  - Accidents.  Patient used medications/device this shift.   09/03/2016 10:24:00 PM  Bowel Score = 5.  One (1) bowel accident.      MEDICAL NEEDS  Swallowing Status: Swallowing Status: Regular Food: solids and liquids swallowed  safely without supervision or modified food consistencies.  QUALITY INDICATORS    Section M. Skin Conditions Discharge  Unhealed Pressure Ulcer(s) at Stage 1 or Higher:  No  Current Number of Unhealed Pressure Ulcers    Number of Unhealed Stage 1: 0  Number of Unhealed Stage 2: 0  Number of Unhealed Stage 3: 0  Number of Unhealed Stage 4: 0  Number of Unhealed Unstageable Due to Non-removable Dressing: 0  Number of Unhealed Unstageable Due to Slough/Eschar: 0  Number of Unhealed Unstageable Due to Suspected Deep Tissue Injury: 0  Worsening in Pressure Ulcer Status Since Admission    Number of Worsening Stage 2: 0  Number of Worsening Stage 3: 0  Number of Worsening Stage 4: 0  Number of Worsening Unstageable Due to Non-removable Dressing: 0  Number of Worsening Unstageable Due to Slough/Eschar: 0  Number of Worsening Unstageable Due to Suspected Deep Tissue Injury: 0  Healed Pressure Ulcer(s)    Number of Healed Stage 1: 1  Number of Healed Stage 2: 0  Number of Healed Stage 3: 0  Number of Healed Stage 4: 0    O0250.Influenza Vaccine - Discharge: Received in this facility for this year's  influenza vaccination season:  No.  Influenza Vaccine Not Received Due To: Received outside of this facility.    Health Conditions: Fall(s) Since Admission:  No    Signed by: Tanda Rockers, PT, PPS Coordiantor 09/04/2016 4:00:00 PM

## 2016-10-07 ENCOUNTER — Telehealth: Payer: Self-pay

## 2016-10-07 NOTE — Telephone Encounter (Signed)
Attempted to contact Patient and/or Patient family for Follow-Up with [facility], Left message with contact information for patient to return call at later date.

## 2016-10-17 ENCOUNTER — Telehealth: Payer: Self-pay

## 2016-10-17 NOTE — Telephone Encounter (Signed)
Attempted to contact Patient and/or Patient family for Follow-Up with [facility], Left message with contact information for patient to return call at later date.

## 2016-10-24 ENCOUNTER — Telehealth: Payer: Self-pay

## 2016-10-24 NOTE — Telephone Encounter (Signed)
[facility] Follow Up Phone Call:    Spoke with:  Daughter In Law, Patient has changed address and lives in [place], but Daughter in [place] assured she would "never call you again, so go ahead and contact us if you guys ever REALLY need anything."   How have you been doing since discharge?  "Doing fine, her recovery can be as expected."    Notes:  None to report   Any readmissions to the hospital/ER?  None she could recall.    Why? NA   Where? NA

## 2016-11-09 ENCOUNTER — Encounter: Payer: Self-pay | Admitting: Interventional Cardiology

## 2016-11-09 ENCOUNTER — Ambulatory Visit (INDEPENDENT_AMBULATORY_CARE_PROVIDER_SITE_OTHER): Payer: Medicare Other | Admitting: Interventional Cardiology

## 2016-11-09 VITALS — BP 132/74 | HR 72 | Ht 64.0 in | Wt 141.0 lb

## 2016-11-09 DIAGNOSIS — I480 Paroxysmal atrial fibrillation: Secondary | ICD-10-CM

## 2016-11-09 DIAGNOSIS — D151 Benign neoplasm of heart: Secondary | ICD-10-CM

## 2016-11-09 DIAGNOSIS — I639 Cerebral infarction, unspecified: Secondary | ICD-10-CM

## 2016-11-09 DIAGNOSIS — R0609 Other forms of dyspnea: Secondary | ICD-10-CM | POA: Diagnosis not present

## 2016-11-09 DIAGNOSIS — E1165 Type 2 diabetes mellitus with hyperglycemia: Secondary | ICD-10-CM | POA: Insufficient documentation

## 2016-11-09 DIAGNOSIS — E1159 Type 2 diabetes mellitus with other circulatory complications: Secondary | ICD-10-CM | POA: Insufficient documentation

## 2016-11-09 DIAGNOSIS — Z794 Long term (current) use of insulin: Secondary | ICD-10-CM

## 2016-11-09 DIAGNOSIS — I350 Nonrheumatic aortic (valve) stenosis: Secondary | ICD-10-CM | POA: Diagnosis not present

## 2016-11-09 DIAGNOSIS — I2510 Atherosclerotic heart disease of native coronary artery without angina pectoris: Secondary | ICD-10-CM | POA: Diagnosis not present

## 2016-11-09 NOTE — Progress Notes (Signed)
Cardiology Office Note   Date:  11/09/2016   ID:  Julia Madden, DOB February 12, 1936, MRN [id number]  PCP:  Eloise Levels, NP    No chief complaint on file.  Follow-up coronary artery disease, right atrial myxoma  Wt Readings from Last 3 Encounters:  11/09/16 141 lb (64 kg)       History of Present Illness: Julia Madden is a 80 y.o. female  Who had CABG x 4 in 2010 While she lived in [place].  She had back pain at that time as her presenting anginal symptom.  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  She had a Cardiac cath preoperatively. This showed 3 of 4 grafts still patent. The RCA graft was occluded. Around the time of her surgery, she had atrial fibrillation. She was asymptomatic from the A. fib. Anticoagulation was discussed between cardiology, cardiac surgery and neurology given the recent stroke, but they opted to keep her on Plavix.  She has been slowly recovering since her surgery. She was in the hospital for about a month. She has had home health PT. She is now able to walk up a flight of stairs, but she has some shortness of breath with activity. This seems to be improving. Her recovery has been slow. She now walks at home with a walker. If she goes out, she uses a wheelchair. She feels unsteady on her feet. She bruises easily. We discussed anticoagulation versus Plavix. Given all the bruising and the unsteadiness on her feet, she would prefer at this point to stay on Plavix. This may be something we revisit in the future.      Past Medical History:  Diagnosis Date  . Arthritis   . 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 CAD (coronary artery disease)   . CKD (chronic kidney disease)   . CVA (cerebral vascular accident) ([place])   . Depression   . Diabetes mellitus without complication ([place])   . Diabetic polyneuropathy ([place])   . Hyperlipidemia   . Hypertension   .  Incontinence   . Long term current use of insulin ([place])   . Long-term (current) use of anticoagulants   . Mass of heart   . Obstructive uropathy    WITH STENT PLACEMENT  . Stroke [facility])    LMCA  . Systolic murmur     Past Surgical History:  Procedure Laterality Date  . AMPUTATION TOE Left 2015   LEFT FOOT TOE   . CORONARY ARTERY BYPASS GRAFT  2010  . EXCISION MYXOMA  07/2016   WITH PFO CLOSURE  . REPLACEMENT TOTAL KNEE Left   . URINARY STENT  2017     Current Outpatient Prescriptions  Medication Sig Dispense Refill  . acetaminophen (TYLENOL) 325 MG tablet Take 650 mg by mouth every 6 (six) hours as needed.    Marland Kitchen amiodarone (PACERONE) 200 MG tablet Take 200 mg by mouth daily.    Marland Kitchen AMLODIPINE BESYLATE PO Take 2.5 mg by mouth.    Marland Kitchen aspirin EC 81 MG tablet Take 81 mg by mouth daily.    Marland Kitchen atorvastatin (LIPITOR) 40 MG tablet Take 40 mg by mouth daily.    . clopidogrel (PLAVIX) 75 MG tablet Take 75 mg by mouth daily.    . DULoxetine (CYMBALTA) 60 MG capsule Take 60 mg by mouth daily.    . insulin  glargine (LANTUS) 100 UNIT/ML injection Inject into the skin at bedtime.    . insulin regular (NOVOLIN R,HUMULIN R) 250 units/2.69mL (100 units/mL) injection Inject into the skin 3 (three) times daily before meals.    . liraglutide (VICTOZA) 18 MG/3ML SOPN Inject 18 mg into the skin.    . metoprolol succinate (TOPROL-XL) 25 MG 24 hr tablet Take 25 mg by mouth daily.    . Probiotic Product (PROBIOTIC DAILY PO) Take by mouth.     No current facility-administered medications for this visit.     Allergies:   Patient has no known allergies.    Social History:  The patient  reports that she has never smoked. She has never used smokeless tobacco. She reports that she does not drink alcohol or use drugs.   Family History:  The patient's family history includes Asthma in her sister; Breast cancer in her sister; CVA in her mother; Diabetes in her brother; Heart disease in her mother; Pneumonia in  her brother.    ROS:  Please see the history of present illness.   Otherwise, review of systems are positive for DOE.   All other systems are reviewed and negative.    PHYSICAL EXAM: VS:  BP 132/74   Pulse 72   Ht 5\' 4"  (1.626 m)   Wt 141 lb (64 kg)   BMI 24.20 kg/m  , BMI Body mass index is 24.2 kg/m. GEN: Well nourished, well developed, in no acute distress  HEENT: normal  Neck: no JVD, carotid bruits, or masses Cardiac: RRR; 3/6 systolic murmur, no rubs, or gallops,tr pretibial edema  Respiratory:  clear to auscultation bilaterally, normal work of breathing GI: soft, nontender, nondistended, + BS MS: no deformity or atrophy  Skin: warm and dry, no rash Neuro:  Strength and sensation are intact Psych: euthymic mood, full affect   EKG:   The ekg ordered 9/17 demonstrates NSR, atrial enlargement, sinus arrhythmia   Recent Labs: No results found for requested labs within last 8760 hours.   Lipid Panel No results found for: CHOL, TRIG, HDL, CHOLHDL, VLDL, LDLCALC, LDLDIRECT   Other studies Reviewed: Additional studies/ records that were reviewed today with results demonstrating: echo, cath , hospital reports.; EF 55-60%; moderate AS   ASSESSMENT AND PLAN:  1. CAD: Prior bypass surgery. At last catheterization in September 2017, 3 out of 4 grafts were patent. Continue aggressive secondary prevention including diabetes control. No angina. 2. Myxoma: Status post resection in October 2017, along with PFO closure. She'll need a follow-up echocardiogram in 2018. We'll also be able to follow her aortic stenosis at that time. 3. Dyspnea on exertion: I think this is most likely related to deconditioning. I stressed the importance of her trying to do more exercise to improve her stamina. 4. Atrial fibrillation: Currently in sinus rhythm. Maintaining sinus rhythm on amiodarone. Will check LFTs and thyroid tests in about 4 months. Continue Plavix. We discussed more potent  anticoagulation but at this point, she is not interested. As she gets stronger and less unsteady on her feet, we could reconsider anticoagulation. 5. CVA: Unclear whether it was myxoma that crossed and cause stroke or whether she had atrial fibrillation that then caused intracardiac thrombus causing a stroke. She has had recovery from a neurologic standpoint.   Current medicines are reviewed at length with the patient today.  The patient concerns regarding her medicines were addressed.  The following changes have been made:  No change  Labs/ tests ordered today include: check  TSH, LFTs in 4 months No orders of the defined types were placed in this encounter.   Recommend 150 minutes/week of aerobic exercise Low fat, low carb, high fiber diet recommended  Disposition:   FU in 3-4 months   Signed, Larae Grooms, MD  11/09/2016 4:47 PM    [facility] [place], [place], [place]  [address] Phone: [phone]; Fax: [phone]

## 2016-11-09 NOTE — Patient Instructions (Addendum)
Medication Instructions:  Same-no changes  Labwork: None  Testing/Procedures: None  Follow-Up: Your physician wants you to follow-up in: 4 months. You will receive a reminder letter in the mail two months in advance. If you don't receive a letter, please call our office to schedule the follow-up appointment.     If you need a refill on your cardiac medications before your next appointment, please call your pharmacy.   

## 2016-11-25 ENCOUNTER — Telehealth: Payer: Self-pay | Admitting: Interventional Cardiology

## 2016-11-25 NOTE — Telephone Encounter (Addendum)
**Note De-Identified Elese Rane Obfuscation** Julia Madden is advised that Dr Irish Lack did not make any changes in the pts medications at her last (her first apt with Dr Irish Lack) on 11/08/16. She states that the pts medications list that we have is incorrect and that the pt takes Metoprolol BID and not qd. I advised her that we were not given a medications list at the pts last apt so all I can go by was what the pt and the person who came with her on the day of her last apt provided [place]. Julia Madden states that she is faxing the pts medi list from her hosp d/c in October 2017 from a hospital in [place] so I can update the pts chart. I did give her this offices fax number.

## 2016-11-25 NOTE — Telephone Encounter (Signed)
New Message:   Julia Madden wants to know if pt's blood pressure medicine was changed her last office visit? She wants to be sure she is taking it correctly.

## 2016-12-03 ENCOUNTER — Ambulatory Visit (INDEPENDENT_AMBULATORY_CARE_PROVIDER_SITE_OTHER): Payer: Medicare Other | Admitting: Internal Medicine

## 2016-12-03 ENCOUNTER — Telehealth: Payer: Self-pay | Admitting: Interventional Cardiology

## 2016-12-03 ENCOUNTER — Encounter: Payer: Self-pay | Admitting: Internal Medicine

## 2016-12-03 VITALS — BP 142/80 | HR 64 | Ht 64.0 in | Wt 143.0 lb

## 2016-12-03 DIAGNOSIS — E1165 Type 2 diabetes mellitus with hyperglycemia: Secondary | ICD-10-CM

## 2016-12-03 DIAGNOSIS — E1159 Type 2 diabetes mellitus with other circulatory complications: Secondary | ICD-10-CM

## 2016-12-03 MED ORDER — INSULIN PEN NEEDLE 32G X 4 MM MISC: 5 refills | Status: AC

## 2016-12-03 MED ORDER — INSULIN LISPRO 100 UNIT/ML (KWIKPEN): 3.0000 [IU] | PEN_INJECTOR | Freq: Three times a day (TID) | SUBCUTANEOUS | 5 refills | Status: DC

## 2016-12-03 NOTE — Telephone Encounter (Signed)
Or [phone] x 12 if na on cell-checking on status of med list that was to be faxed to our office-pls call

## 2016-12-03 NOTE — Patient Instructions (Signed)
Please continue Lantus 12 units at bedtime.  Start Humalog before meals: - 3 units before a smaller meal - 5 units before a larger meal  Please return in 1.5 months with your sugar log.   Please let me know if the sugars are consistently <80 or >200.  PATIENT INSTRUCTIONS FOR TYPE 2 DIABETES:  **Please join MyChart!** - see attached instructions about how to join if you have not done so already.  DIET AND EXERCISE Diet and exercise is an important part of diabetic treatment.  We recommended aerobic exercise in the form of brisk walking (working between 40-60% of maximal aerobic capacity, similar to brisk walking) for 150 minutes per week (such as 30 minutes five days per week) along with 3 times per week performing 'resistance' training (using various gauge rubber tubes with handles) 5-10 exercises involving the major muscle groups (upper body, lower body and core) performing 10-15 repetitions (or near fatigue) each exercise. Start at half the above goal but build slowly to reach the above goals. If limited by weight, joint pain, or disability, we recommend daily walking in a swimming pool with water up to waist to reduce pressure from joints while allow for adequate exercise.    BLOOD GLUCOSES Monitoring your blood glucoses is important for continued management of your diabetes. Please check your blood glucoses 2-4 times a day: fasting, before meals and at bedtime (you can rotate these measurements - e.g. one day check before the 3 meals, the next day check before 2 of the meals and before bedtime, etc.).   HYPOGLYCEMIA (low blood sugar) Hypoglycemia is usually a reaction to not eating, exercising, or taking too much insulin/ other diabetes drugs.  Symptoms include tremors, sweating, hunger, confusion, headache, etc. Treat IMMEDIATELY with 15 grams of Carbs: . 4 glucose tablets .  cup regular juice/soda . 2 tablespoons raisins . 4 teaspoons sugar . 1 tablespoon honey Recheck blood  glucose in 15 mins and repeat above if still symptomatic/blood glucose <100.  RECOMMENDATIONS TO REDUCE YOUR RISK OF DIABETIC COMPLICATIONS: * Take your prescribed MEDICATION(S) * Follow a DIABETIC diet: Complex carbs, fiber rich foods, (monounsaturated and polyunsaturated) fats * AVOID saturated/trans fats, high fat foods, >2,300 mg salt per day. * EXERCISE at least 5 times a week for 30 minutes or preferably daily.  * DO NOT SMOKE OR DRINK more than 1 drink a day. * Check your FEET every day. Do not wear tightfitting shoes. Contact us if you develop an ulcer * See your EYE doctor once a year or more if needed * Get a FLU shot once a year * Get a PNEUMONIA vaccine once before and once after age 57 years  GOALS:  * Your Hemoglobin A1c of <7%  * fasting sugars need to be <130 * after meals sugars need to be <180 (2h after you start eating) * Your Systolic BP should be [id number] or lower  * Your Diastolic BP should be 80 or lower  * Your HDL (Good Cholesterol) should be 40 or higher  * Your LDL (Bad Cholesterol) should be 100 or lower. * Your Triglycerides should be 150 or lower  * Your Urine microalbumin (kidney function) should be <30 * Your Body Mass Index should be 25 or lower    Please consider the following ways to cut down carbs and fat and increase fiber and micronutrients in your diet: - substitute whole grain for white bread or pasta - substitute brown rice for white rice - substitute 90-calorie flat  bread pieces for slices of bread when possible - substitute sweet potatoes or yams for white potatoes - substitute humus for margarine - substitute tofu for cheese when possible - substitute almond or rice milk for regular milk (would not drink soy milk daily due to concern for soy estrogen influence on breast cancer risk) - substitute dark chocolate for other sweets when possible - substitute water - can add lemon or orange slices for taste - for diet sodas (artificial sweeteners  will trick your body that you can eat sweets without getting calories and will lead you to overeating and weight gain in the long run) - do not skip breakfast or other meals (this will slow down the metabolism and will result in more weight gain over time)  - can try smoothies made from fruit and almond/rice milk in am instead of regular breakfast - can also try old-fashioned (not instant) oatmeal made with almond/rice milk in am - order the dressing on the side when eating salad at a restaurant (pour less than half of the dressing on the salad) - eat as little meat as possible - can try juicing, but should not forget that juicing will get rid of the fiber, so would alternate with eating raw veg./fruits or drinking smoothies - use as little oil as possible, even when using olive oil - can dress a salad with a mix of balsamic vinegar and lemon juice, for e.g. - use agave nectar, stevia sugar, or regular sugar rather than artificial sweateners - steam or broil/roast veggies  - snack on veggies/fruit/nuts (unsalted, preferably) when possible, rather than processed foods - reduce or eliminate aspartame in diet (it is in diet sodas, chewing gum, etc) Read the labels!  Try to read Dr. Janene Harvey book: "Program for Reversing Diabetes" for other ideas for healthy eating.

## 2016-12-03 NOTE — Progress Notes (Signed)
Patient ID: [name], female   DOB: 05-02-1936, 81 y.o.   MRN: [id number]   HPI: Julia Madden is a 81 y.o.-year-old female, referred by her PCP, Julia Levels, NP, for management of DM2, dx in ~1997 insulin-dependent since ~2012, uncontrolled, with complications (CAD, s/p CABGx4 in 2010, CKD, PN, toe amputations).  She is here with her daughter in law >> she offers most of the hx. She has seen endocrinology in the past (Dr. Abby Madden), however, she would want to establish care with [facility] endocrinology. She has [place].  Last hemoglobin A1c was: 10/23/2016: HbA1c 11%  Pt is on a regimen of: - Lantus 12 units at bedtime  She is not on: - Humulin R insulin 1-4 units 3x a day, before meals - Victoza 1.8 mg daily in a.m. (taken off in the hospital)  Pt checks her sugars 1x a week and they are: - am: 120-130 - 2h after b'fast: n/c - before lunch: n/c - 2h after lunch: n/c - before dinner: n/c - 2h after dinner: n/c - bedtime: n/c - nighttime: n/c No lows. Lowest sugar was 80s; she has hypoglycemia awareness at 70.  Highest sugar was 300.  Glucometer: FreeStyle  Pt's meals are: - Breakfast: OJ, cereal - cheerios + milk, coffee - Lunch: sandwich, egg, yoghurt chips, sodas - Dinner: home cooked - meat + veggies + dinner - Snacks: fruit, yoghurt, eggs  - no CKD, last BUN/creatinine:  08/23/2016: 10/1.5 - will see nephrology (Dr. Hollie Madden) No results found for: BUN, CREATININE - last set of lipids: No results found for: CHOL, HDL, LDLCALC, LDLDIRECT, TRIG, CHOLHDL - last eye exam was in 2016. No DR.  - + numbness and tingling in her feet.   Pt has FH of DM in mother, sister, brother.  She also has a history of stroke, deemed secondary to atrial myxoma. She had surgery for the myxoma removal in 08/2016.  ROS: Constitutional: no weight gain/loss, no fatigue, no subjective hyperthermia/hypothermia Eyes: no blurry vision, no xerophthalmia ENT: no sore  throat, no nodules palpated in throat, no dysphagia/odynophagia, no hoarseness, + hypoacusis Cardiovascular: no CP/palpitations/leg swelling Respiratory: no cough/+ SOB Gastrointestinal: no N/V/D/C Musculoskeletal: no muscle +/joint aches Skin: no rashes Neurological: no tremors/numbness/tingling/dizziness Psychiatric: + depression/no anxiety   Past Medical History:  Diagnosis Date  . Arthritis   . 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 CAD (coronary artery disease)   . CKD (chronic kidney disease)   . CVA (cerebral vascular accident) ([place])   . Depression   . Diabetes mellitus without complication ([place])   . Diabetic polyneuropathy ([place])   . Hyperlipidemia   . Hypertension   . Incontinence   . Long term current use of insulin ([place])   . Long-term (current) use of anticoagulants   . Mass of heart   . Obstructive uropathy    WITH STENT PLACEMENT  . Stroke [facility])    LMCA  . Systolic murmur    Past Surgical History:  Procedure Laterality Date  . AMPUTATION TOE Left 2015   LEFT FOOT TOE   . CORONARY ARTERY BYPASS GRAFT  2010  . EXCISION MYXOMA  07/2016   WITH PFO CLOSURE  . REPLACEMENT TOTAL KNEE Left   . URINARY STENT  2017   Social History   Social History  . Marital status: Widowed    Spouse name: N/A  . Number of children: 5   Occupational History  . retired  Social History Main Topics  . Smoking status: Never Smoker  . Smokeless tobacco: Never Used  . Alcohol use No  . Drug use: No   Current Outpatient Prescriptions on File Prior to Visit  Medication Sig Dispense Refill  . acetaminophen (TYLENOL) 325 MG tablet Take 650 mg by mouth every 6 (six) hours as needed.    Marland Kitchen amiodarone (PACERONE) 200 MG tablet Take 200 mg by mouth daily.    Marland Kitchen AMLODIPINE BESYLATE PO Take 2.5 mg by mouth.    Marland Kitchen aspirin EC 81 MG tablet Take 81 mg by mouth daily.    Marland Kitchen atorvastatin (LIPITOR) 40 MG tablet Take 40 mg by mouth daily.    . clopidogrel (PLAVIX) 75 MG tablet Take  75 mg by mouth daily.    . DULoxetine (CYMBALTA) 60 MG capsule Take 60 mg by mouth daily.    . insulin glargine (LANTUS) 100 UNIT/ML injection Inject into the skin at bedtime.    . Probiotic Product (PROBIOTIC DAILY PO) Take by mouth.    . insulin regular (NOVOLIN R,HUMULIN R) 250 units/2.39mL (100 units/mL) injection Inject into the skin 3 (three) times daily before meals.     No current facility-administered medications on file prior to visit.    No Known Allergies Family History  Problem Relation Age of Onset  . Heart disease Mother   . CVA Mother   . Breast cancer Sister   . Pneumonia Brother   . Diabetes Brother     dialysis  . Asthma Sister    PE: BP (!) 142/80   Pulse 64   Ht 5\' 4"  (1.626 m)   Wt 143 lb (64.9 kg)   SpO2 97%   BMI 24.55 kg/m  Wt Readings from Last 3 Encounters:  12/03/16 143 lb (64.9 kg)  11/09/16 141 lb (64 kg)   Constitutional: overweight, in NAD, in wheelchair Eyes: PERRLA, EOMI, no exophthalmos ENT: moist mucous membranes, no thyromegaly, no cervical lymphadenopathy Cardiovascular: RRR, + 2/6 SEM, no RG Respiratory: CTA B Gastrointestinal: abdomen soft, NT, ND, BS+ Musculoskeletal: no deformities, strength intact in all 4 Skin: moist, warm, no rashes Neurological: + mild tremor with outstretched hands, DTR normal in all 4  ASSESSMENT: 1. DM2, insulin-dependent, uncontrolled, with complications - CAD,s/p CABGx4 in 2010 - CKD - PN - left toe amputation in 2015 + several other amputations  PLAN:  1. Patient with long-standing, uncontrolled diabetes, on oral antidiabetic regimen, which became insufficient. Her latest HbA1c checked by PCP was 11% reportedly. I do not have the records, but we will ask PCP for them. She is on a low-dose basal insulin only. She was on Victoza, however, this was expensive, and she was taken off this in the hospital. She was also advised to start regular insulin at discharge, but they did not start this yet, pending  our appointment. - It is difficult to decide about changing her regimen without CBG checks. I strongly advised him to start checking 2-3 times a day and bring the log at next visit. However, for now, based on the HbA1c, she will need a more intense regimen, and, unfortunately, we are limited by price and her kidney function. I did suggest to add mealtime insulin low dose, however, Humalog pens rather then regular insulin vials, if covered by insurance. - We also reviewed her diet in detail and I suggested several changes: Stopping orange juice, limiting starches, etc. Her daughter-in-law, who accompanies her today, is a Microbiologist. - I suggested to:  Patient Instructions  Please continue Lantus 12 units at bedtime.  Start Humalog before meals: - 3 units before a smaller meal - 5 units before a larger meal  Please return in 1.5 months with your sugar log.   Please let me know if the sugars are consistently <80 or >200.  - Strongly advised her to start checking sugars at different times of the day - check 2-3times a day, rotating checks. We discussed about alternative site testing, since she does not like pricking her fingers. - given sugar log and advised how to fill it and to bring it at next appt  - given foot care handout and explained the principles  - given instructions for hypoglycemia management "15-15 rule"  - advised for yearly eye exams  - Return to clinic in 1.5 mo with sugar log   Philemon Kingdom, MD PhD [facility]

## 2016-12-04 ENCOUNTER — Other Ambulatory Visit: Payer: Self-pay

## 2016-12-04 ENCOUNTER — Telehealth: Payer: Self-pay | Admitting: Internal Medicine

## 2016-12-04 MED ORDER — METOPROLOL TARTRATE 25 MG PO TABS: 25.0000 mg | ORAL_TABLET | Freq: Two times a day (BID) | ORAL | 3 refills | Status: AC

## 2016-12-04 MED ORDER — INSULIN GLARGINE 100 UNIT/ML ~~LOC~~ SOLN: 12.0000 [IU] | Freq: Every day | SUBCUTANEOUS | 1 refills | Status: AC

## 2016-12-04 MED ORDER — AMIODARONE HCL 200 MG PO TABS: 200.0000 mg | ORAL_TABLET | Freq: Every day | ORAL | 3 refills | Status: AC

## 2016-12-04 NOTE — Telephone Encounter (Signed)
[facility]

## 2016-12-04 NOTE — Telephone Encounter (Signed)
Patient"s daughter-in-law forgot to get patient's nighttime insulin refill at her visit on 12/03/16.  She needs nighttime insulin-12 units called in to [facility].

## 2016-12-04 NOTE — Telephone Encounter (Addendum)
The pts daughter in law, Lanelle Bal, faxed me a copy of the pts med list from a hospital d/c in [place] as the pts medications were not given to [place] correctly at the pts first OV with Dr Irish Lack on 11/09/16.  The list shows that the pt is taking Metoprolol 25 mg BID-not once daily as we have listed. Also Lanelle Bal states that the pt takes Amiodarone 200 mg BID-not once daily as we have listed.  Lanelle Bal states that the pt has been taking Metoprolol 25 mg BID and Amiodarone 200 mg BID all along and that [place] has been coming to the pts home checking her BP twice weekly and her BPs have been normal. Lanelle Bal states that the pts BP today is 141/80.  Lanelle Bal is requesting refills on these medications.  I advised Lanelle Bal that since the pt has been taking Metoprolol BID I will update the pts med list and send in a refill to [facility] to fill as I dont want to interrupt the pts medications therapy but that Amiodarone is a once a day mediation once the pt has had the loading dose. She is aware that I will send in a Amiodarone 200 mg refill to be taken once daily. She verbalized understanding and states that she will lower the pts Amiodarone dose to 200 mg one daily.  I will forward message to Dr Irish Lack as Juluis Rainier.    I will forward to Dr Irish Lack as Juluis Rainier

## 2016-12-12 ENCOUNTER — Inpatient Hospital Stay (HOSPITAL_COMMUNITY)
Admission: EM | Admit: 2016-12-12 | Discharge: 2016-12-24 | DRG: 023 | Disposition: E | Payer: Medicare Other | Attending: Neurology | Admitting: Neurology

## 2016-12-12 ENCOUNTER — Encounter (HOSPITAL_COMMUNITY): Payer: Self-pay | Admitting: Emergency Medicine

## 2016-12-12 ENCOUNTER — Inpatient Hospital Stay (HOSPITAL_COMMUNITY): Payer: Medicare Other

## 2016-12-12 ENCOUNTER — Emergency Department (HOSPITAL_COMMUNITY): Payer: Medicare Other

## 2016-12-12 ENCOUNTER — Inpatient Hospital Stay (HOSPITAL_COMMUNITY): Payer: Medicare Other | Admitting: Anesthesiology

## 2016-12-12 ENCOUNTER — Encounter (HOSPITAL_COMMUNITY): Admission: EM | Disposition: E | Payer: Self-pay | Source: Home / Self Care | Attending: Neurology

## 2016-12-12 DIAGNOSIS — D638 Anemia in other chronic diseases classified elsewhere: Secondary | ICD-10-CM | POA: Diagnosis present

## 2016-12-12 DIAGNOSIS — Z79899 Other long term (current) drug therapy: Secondary | ICD-10-CM

## 2016-12-12 DIAGNOSIS — Z7902 Long term (current) use of antithrombotics/antiplatelets: Secondary | ICD-10-CM

## 2016-12-12 DIAGNOSIS — Z7982 Long term (current) use of aspirin: Secondary | ICD-10-CM

## 2016-12-12 DIAGNOSIS — E1165 Type 2 diabetes mellitus with hyperglycemia: Secondary | ICD-10-CM | POA: Diagnosis present

## 2016-12-12 DIAGNOSIS — I10 Essential (primary) hypertension: Secondary | ICD-10-CM | POA: Diagnosis not present

## 2016-12-12 DIAGNOSIS — Z96652 Presence of left artificial knee joint: Secondary | ICD-10-CM | POA: Diagnosis present

## 2016-12-12 DIAGNOSIS — Z8673 Personal history of transient ischemic attack (TIA), and cerebral infarction without residual deficits: Secondary | ICD-10-CM | POA: Diagnosis not present

## 2016-12-12 DIAGNOSIS — E785 Hyperlipidemia, unspecified: Secondary | ICD-10-CM | POA: Diagnosis present

## 2016-12-12 DIAGNOSIS — Z794 Long term (current) use of insulin: Secondary | ICD-10-CM

## 2016-12-12 DIAGNOSIS — L8990 Pressure ulcer of unspecified site, unspecified stage: Secondary | ICD-10-CM | POA: Insufficient documentation

## 2016-12-12 DIAGNOSIS — Z89422 Acquired absence of other left toe(s): Secondary | ICD-10-CM

## 2016-12-12 DIAGNOSIS — I639 Cerebral infarction, unspecified: Secondary | ICD-10-CM | POA: Diagnosis present

## 2016-12-12 DIAGNOSIS — E875 Hyperkalemia: Secondary | ICD-10-CM | POA: Diagnosis present

## 2016-12-12 DIAGNOSIS — G935 Compression of brain: Secondary | ICD-10-CM | POA: Diagnosis present

## 2016-12-12 DIAGNOSIS — I6300 Cerebral infarction due to thrombosis of unspecified precerebral artery: Secondary | ICD-10-CM | POA: Diagnosis not present

## 2016-12-12 DIAGNOSIS — Z951 Presence of aortocoronary bypass graft: Secondary | ICD-10-CM | POA: Diagnosis not present

## 2016-12-12 DIAGNOSIS — Z7901 Long term (current) use of anticoagulants: Secondary | ICD-10-CM

## 2016-12-12 DIAGNOSIS — I129 Hypertensive chronic kidney disease with stage 1 through stage 4 chronic kidney disease, or unspecified chronic kidney disease: Secondary | ICD-10-CM | POA: Diagnosis present

## 2016-12-12 DIAGNOSIS — I2510 Atherosclerotic heart disease of native coronary artery without angina pectoris: Secondary | ICD-10-CM | POA: Diagnosis present

## 2016-12-12 DIAGNOSIS — Z66 Do not resuscitate: Secondary | ICD-10-CM | POA: Diagnosis not present

## 2016-12-12 DIAGNOSIS — I63411 Cerebral infarction due to embolism of right middle cerebral artery: Secondary | ICD-10-CM | POA: Diagnosis present

## 2016-12-12 DIAGNOSIS — Z515 Encounter for palliative care: Secondary | ICD-10-CM | POA: Diagnosis present

## 2016-12-12 DIAGNOSIS — G8194 Hemiplegia, unspecified affecting left nondominant side: Secondary | ICD-10-CM | POA: Diagnosis present

## 2016-12-12 DIAGNOSIS — N189 Chronic kidney disease, unspecified: Secondary | ICD-10-CM | POA: Diagnosis present

## 2016-12-12 DIAGNOSIS — G936 Cerebral edema: Secondary | ICD-10-CM

## 2016-12-12 DIAGNOSIS — E871 Hypo-osmolality and hyponatremia: Secondary | ICD-10-CM | POA: Diagnosis present

## 2016-12-12 DIAGNOSIS — I4891 Unspecified atrial fibrillation: Secondary | ICD-10-CM | POA: Diagnosis present

## 2016-12-12 DIAGNOSIS — Z833 Family history of diabetes mellitus: Secondary | ICD-10-CM

## 2016-12-12 DIAGNOSIS — R29818 Other symptoms and signs involving the nervous system: Secondary | ICD-10-CM

## 2016-12-12 DIAGNOSIS — J9600 Acute respiratory failure, unspecified whether with hypoxia or hypercapnia: Secondary | ICD-10-CM

## 2016-12-12 DIAGNOSIS — I63311 Cerebral infarction due to thrombosis of right middle cerebral artery: Secondary | ICD-10-CM | POA: Diagnosis not present

## 2016-12-12 DIAGNOSIS — E1142 Type 2 diabetes mellitus with diabetic polyneuropathy: Secondary | ICD-10-CM | POA: Diagnosis present

## 2016-12-12 DIAGNOSIS — J9690 Respiratory failure, unspecified, unspecified whether with hypoxia or hypercapnia: Secondary | ICD-10-CM

## 2016-12-12 DIAGNOSIS — R29721 NIHSS score 21: Secondary | ICD-10-CM | POA: Diagnosis present

## 2016-12-12 DIAGNOSIS — R29810 Facial weakness: Secondary | ICD-10-CM | POA: Diagnosis present

## 2016-12-12 DIAGNOSIS — Z8249 Family history of ischemic heart disease and other diseases of the circulatory system: Secondary | ICD-10-CM

## 2016-12-12 DIAGNOSIS — E1122 Type 2 diabetes mellitus with diabetic chronic kidney disease: Secondary | ICD-10-CM | POA: Diagnosis present

## 2016-12-12 DIAGNOSIS — I6789 Other cerebrovascular disease: Secondary | ICD-10-CM | POA: Diagnosis not present

## 2016-12-12 HISTORY — PX: IR GENERIC HISTORICAL: IMG1180011

## 2016-12-12 HISTORY — PX: RADIOLOGY WITH ANESTHESIA: SHX6223

## 2016-12-12 LAB — COMPREHENSIVE METABOLIC PANEL
ALT: 13 U/L — AB (ref 14–54)
ANION GAP: 12 (ref 5–15)
AST: 19 U/L (ref 15–41)
Albumin: 2.6 g/dL — ABNORMAL LOW (ref 3.5–5.0)
Alkaline Phosphatase: 85 U/L (ref 38–126)
BUN: 28 mg/dL — ABNORMAL HIGH (ref 6–20)
CHLORIDE: 99 mmol/L — AB (ref 101–111)
CO2: 22 mmol/L (ref 22–32)
Calcium: 8.5 mg/dL — ABNORMAL LOW (ref 8.9–10.3)
Creatinine, Ser: 1.83 mg/dL — ABNORMAL HIGH (ref 0.44–1.00)
GFR calc non Af Amer: 25 mL/min — ABNORMAL LOW (ref >60)
GFR, EST AFRICAN AMERICAN: 29 mL/min — AB (ref >60)
Glucose, Bld: 399 mg/dL — ABNORMAL HIGH (ref 65–99)
Potassium: 5 mmol/L (ref 3.5–5.1)
SODIUM: 133 mmol/L — AB (ref 135–145)
Total Bilirubin: 0.3 mg/dL (ref 0.3–1.2)
Total Protein: 6.1 g/dL — ABNORMAL LOW (ref 6.5–8.1)

## 2016-12-12 LAB — DIFFERENTIAL
BASOS PCT: 0 %
Basophils Absolute: 0 10*3/uL (ref 0.0–0.1)
EOS PCT: 2 %
Eosinophils Absolute: 0.1 10*3/uL (ref 0.0–0.7)
Lymphocytes Relative: 21 %
Lymphs Abs: 1.7 10*3/uL (ref 0.7–4.0)
Monocytes Absolute: 0.2 10*3/uL (ref 0.1–1.0)
Monocytes Relative: 2 %
NEUTROS ABS: 6.4 10*3/uL (ref 1.7–7.7)
Neutrophils Relative %: 75 %

## 2016-12-12 LAB — I-STAT CHEM 8, ED
BUN: 41 mg/dL — ABNORMAL HIGH (ref 6–20)
Calcium, Ion: 1.01 mmol/L — ABNORMAL LOW (ref 1.15–1.40)
Chloride: 97 mmol/L — ABNORMAL LOW (ref 101–111)
Creatinine, Ser: 1.8 mg/dL — ABNORMAL HIGH (ref 0.44–1.00)
Glucose, Bld: 413 mg/dL — ABNORMAL HIGH (ref 65–99)
HEMATOCRIT: 31 % — AB (ref 36.0–46.0)
HEMOGLOBIN: 10.5 g/dL — AB (ref 12.0–15.0)
Potassium: 6.3 mmol/L (ref 3.5–5.1)
Sodium: 130 mmol/L — ABNORMAL LOW (ref 135–145)
TCO2: 25 mmol/L (ref 0–100)

## 2016-12-12 LAB — I-STAT TROPONIN, ED: Troponin i, poc: 0.01 ng/mL (ref 0.00–0.08)

## 2016-12-12 LAB — CBC
HCT: 29 % — ABNORMAL LOW (ref 36.0–46.0)
Hemoglobin: 9.1 g/dL — ABNORMAL LOW (ref 12.0–15.0)
MCH: 26.9 pg (ref 26.0–34.0)
MCHC: 31.4 g/dL (ref 30.0–36.0)
MCV: 85.8 fL (ref 78.0–100.0)
PLATELETS: 433 10*3/uL — AB (ref 150–400)
RBC: 3.38 MIL/uL — ABNORMAL LOW (ref 3.87–5.11)
RDW: 16.3 % — AB (ref 11.5–15.5)
WBC: 8.4 10*3/uL (ref 4.0–10.5)

## 2016-12-12 LAB — PROTIME-INR
INR: 1.04
PROTHROMBIN TIME: 13.6 s (ref 11.4–15.2)

## 2016-12-12 LAB — CBG MONITORING, ED: GLUCOSE-CAPILLARY: 381 mg/dL — AB (ref 65–99)

## 2016-12-12 LAB — APTT: aPTT: 35 seconds (ref 24–36)

## 2016-12-12 SURGERY — RADIOLOGY WITH ANESTHESIA
Anesthesia: General

## 2016-12-12 MED ORDER — LIDOCAINE HCL (CARDIAC) 20 MG/ML IV SOLN
INTRAVENOUS | Status: DC | PRN
  Administered 2016-12-12: 50 mg via INTRATRACHEAL

## 2016-12-12 MED ORDER — IOPAMIDOL (ISOVUE-370) INJECTION 76%
INTRAVENOUS | Status: AC
  Administered 2016-12-12: 50 mL
  Filled 2016-12-12: qty 50

## 2016-12-12 MED ORDER — PROPOFOL 500 MG/50ML IV EMUL
INTRAVENOUS | Status: DC | PRN
  Administered 2016-12-12: 30 ug/kg/min via INTRAVENOUS

## 2016-12-12 MED ORDER — SODIUM CHLORIDE 0.9 % IV SOLN
INTRAVENOUS | Status: DC | PRN
  Administered 2016-12-12 (×2): via INTRAVENOUS

## 2016-12-12 MED ORDER — FENTANYL CITRATE (PF) 100 MCG/2ML IJ SOLN
INTRAMUSCULAR | Status: AC
  Filled 2016-12-12: qty 2

## 2016-12-12 MED ORDER — INSULIN ASPART 100 UNIT/ML IV SOLN
8.0000 [IU] | Freq: Once | INTRAVENOUS | Status: AC
  Administered 2016-12-12: 8 [IU] via INTRAVENOUS

## 2016-12-12 MED ORDER — IOPAMIDOL (ISOVUE-300) INJECTION 61%
INTRAVENOUS | Status: AC
  Administered 2016-12-12: 80 mL
  Filled 2016-12-12: qty 150

## 2016-12-12 MED ORDER — SODIUM CHLORIDE 0.9 % IV BOLUS (SEPSIS)
500.0000 mL | Freq: Once | INTRAVENOUS | Status: AC
  Administered 2016-12-12: 500 mL via INTRAVENOUS

## 2016-12-12 MED ORDER — ALTEPLASE (STROKE) FULL DOSE INFUSION
0.9000 mg/kg | Freq: Once | INTRAVENOUS | Status: AC
  Administered 2016-12-12: 60 mg via INTRAVENOUS
  Filled 2016-12-12: qty 100

## 2016-12-12 MED ORDER — PHENYLEPHRINE HCL 10 MG/ML IJ SOLN
INTRAMUSCULAR | Status: DC | PRN
  Administered 2016-12-12 (×2): 80 ug via INTRAVENOUS
  Administered 2016-12-12: 40 ug via INTRAVENOUS

## 2016-12-12 MED ORDER — IOPAMIDOL (ISOVUE-300) INJECTION 61%
INTRAVENOUS | Status: AC
  Administered 2016-12-12: 40 mL
  Filled 2016-12-12: qty 150

## 2016-12-12 MED ORDER — CEFAZOLIN SODIUM-DEXTROSE 2-4 GM/100ML-% IV SOLN
INTRAVENOUS | Status: AC
  Filled 2016-12-12: qty 100

## 2016-12-12 MED ORDER — PROPOFOL 10 MG/ML IV BOLUS
INTRAVENOUS | Status: DC | PRN
  Administered 2016-12-12: 150 mg via INTRAVENOUS

## 2016-12-12 MED ORDER — ROCURONIUM BROMIDE 100 MG/10ML IV SOLN
INTRAVENOUS | Status: DC | PRN
  Administered 2016-12-12: 10 mg via INTRAVENOUS
  Administered 2016-12-12: 60 mg via INTRAVENOUS
  Administered 2016-12-12: 30 mg via INTRAVENOUS

## 2016-12-12 MED ORDER — CEFAZOLIN SODIUM-DEXTROSE 2-3 GM-% IV SOLR
INTRAVENOUS | Status: DC | PRN
  Administered 2016-12-12: 2 g via INTRAVENOUS

## 2016-12-12 MED ORDER — FENTANYL CITRATE (PF) 100 MCG/2ML IJ SOLN
INTRAMUSCULAR | Status: DC | PRN
  Administered 2016-12-12 (×2): 50 ug via INTRAVENOUS

## 2016-12-12 MED ORDER — EPHEDRINE SULFATE 50 MG/ML IJ SOLN
INTRAMUSCULAR | Status: DC | PRN
  Administered 2016-12-12: 5 mg via INTRAVENOUS

## 2016-12-12 MED ORDER — SODIUM CHLORIDE 0.9 % IV SOLN
1.0000 g | Freq: Once | INTRAVENOUS | Status: AC
  Administered 2016-12-12: 1 g via INTRAVENOUS
  Filled 2016-12-12: qty 10

## 2016-12-12 MED ORDER — NITROGLYCERIN 1 MG/10 ML FOR IR/CATH LAB
INTRA_ARTERIAL | Status: AC
  Filled 2016-12-12: qty 10

## 2016-12-12 MED ORDER — LIDOCAINE HCL (CARDIAC) 20 MG/ML IV SOLN
INTRAVENOUS | Status: DC | PRN
Start: 2016-12-12 — End: 2016-12-13
  Administered 2016-12-12: 40 mg via INTRAVENOUS

## 2016-12-12 MED ORDER — INSULIN ASPART 100 UNIT/ML ~~LOC~~ SOLN
SUBCUTANEOUS | Status: AC
  Filled 2016-12-12: qty 1

## 2016-12-12 MED ORDER — PHENYLEPHRINE HCL 10 MG/ML IJ SOLN
INTRAVENOUS | Status: DC | PRN
  Administered 2016-12-12: 50 ug/min via INTRAVENOUS

## 2016-12-12 NOTE — ED Triage Notes (Signed)
Brought by ems from home.  Family reported at [facility] sudden onset of HA with left sided facial droop and left side weakness.

## 2016-12-12 NOTE — ED Notes (Signed)
Abnormal Chem-8 results reported to Dr. Reather Converse

## 2016-12-12 NOTE — Significant Event (Signed)
Rapid Response Event Note  Overview: Time Called: 1949 Event Type: Neurologic  Initial Focused Assessment: Code Stroke   Interventions: Neuro exam and IV TPA  Plan of Care (if not transferred):  Event Summary:    Assisted with care of patient with left sided weakness and right gaze. [name] was at [place] per her son. NIHSS of 21 was recorded on arrival to ED. CT of head was performed. Patient met requirement for IV TPA. BP was 166/75 and CBG was 391. Patient was given full dose TPA per order of Dr. Leonel Ramsay. Patient was escorted to IR for further treatment of stroke symptoms.      Julia Madden [place]

## 2016-12-12 NOTE — Progress Notes (Signed)
Neuro-Interventional Radiology Pre-procedure Note   81 yo female presents to [facility] ED via EMS with acute onset of left hemiplegia.    Last known well at 7:30pm.   CT head neg for hemorrhage.  IV tPA given.    CT with dense right MCA, with ELVO confirmed on CTA.    Discussed with Dr. Leonel Ramsay.  NIHSS ~21.   ASPECTS = 10.  Good baseline status.   Agree good candidate for revascularization.    Discussed with son in [place].  Risks and Benefits discussed with the patient including, but not limited to bleeding, infection, vascular injury, contrast reaction, induced renal failure/injury, 10-15% risk of intracranial hemorrhage, cardiopulmonary collapse, death. All of the family's questions were answered, family is agreeable to proceed.  Will proceed with angiogram and thrombectomy, RMCA ELVO.   Consent signed and in chart.  Signed,  Dulcy Fanny. Earleen Newport, DO

## 2016-12-12 NOTE — ED Notes (Signed)
Taken to IR at this time.  

## 2016-12-12 NOTE — H&P (Addendum)
Neurology H&P  CC: Left-sided weakness  History is obtained from: Patient  HPI: Julia Madden is a 81 y.o. female with a history of previous stroke with mild deficits who presents with acute onset left-sided weakness. Her previous stroke happened in the setting of cardiac surgery and she improved very quickly, but was debilitated because of the cardiac surgery and therefore did have to rehabilitate following this.  Currently she is able to take care of all of her own activities of daily living, going up and down stairs, but does occasionally have an aide that comes and helps with chores.   LKW: 7:30 PM tpa given?: yes Modified Rankin score: 2   ROS: Unable to obtain due to altered mental status.   Past Medical History:  Diagnosis Date  . Arthritis   . 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 CAD (coronary artery disease)   . CKD (chronic kidney disease)   . CVA (cerebral vascular accident) ([place])   . Depression   . Diabetes mellitus without complication ([place])   . Diabetic polyneuropathy ([place])   . Hyperlipidemia   . Hypertension   . Incontinence   . Long term current use of insulin ([place])   . Long-term (current) use of anticoagulants   . Mass of heart   . Obstructive uropathy    WITH STENT PLACEMENT  . Stroke [facility])    LMCA  . Systolic murmur      Family History  Problem Relation Age of Onset  . Heart disease Mother   . CVA Mother   . Breast cancer Sister   . Pneumonia Brother   . Diabetes Brother     dialysis  . Asthma Sister      Social History:  reports that she has never smoked. She has never used smokeless tobacco. She reports that she does not drink alcohol or use drugs.   Exam: Current vital signs: BP (!) 153/108   Pulse 78   Resp 18   Ht 5\' 4"  (1.626 m)   Wt 67.2 kg (148 lb 2.4 oz)   LMP  (LMP Unknown)   SpO2 98%   BMI 25.43 kg/m  Vital signs in last 24 hours: Pulse Rate:  [78] 78 (01/20 2038) Resp:  [18-20] 18 (01/20  2100) BP: (153-172)/(70-108) 153/108 (01/20 2100) SpO2:  [98 %] 98 % (01/20 2112) Weight:  [67.2 kg (148 lb 2.4 oz)] 67.2 kg (148 lb 2.4 oz) (01/20 2029)  Physical Exam  Constitutional: Appears well-developed and well-nourished.  Psych: Affect appropriate to situation Eyes: No scleral injection HENT: No OP obstrucion Head: Normocephalic.  Cardiovascular: Normal rate and regular rhythm.  Respiratory: Effort normal and breath sounds normal to anterior ascultation GI: Soft.  No distension. There is no tenderness.  Skin: WDI  Neuro: Mental Status: Patient is awake, alert, able to follow simple commands and answer some questions. She has a severe left hemi-neglect Cranial Nerves: II: Left hemianopia Pupils are equal, round, and reactive to light.   III,IV, VI: EOMI without ptosis or diploplia.  V: Facial sensation is decreased on the left VII: Facial movement is left facial droop VIII: hearing is intact to voice X: Does not cooperate XI: Shoulder shrug is symmetric. XII: She does not protrude her tongue Motor: Tone is normal. Bulk is normal. She has no voluntary movement of the left side, she does have abnormal flexion to noxious stimuli on the left side Sensory: Sensation is diminished on the left side Cerebellar:  No clear ataxia on the right   I have reviewed labs in epic and the results pertinent to this consultation are: Hyperkalemia at 6.3, posttreatment with insulin came down to 5. Glucose 399   I have reviewed the images obtained: CT head/CTA-right MCA occlusion  Impression: 81 year old female with right MCA occlusion, she is being taken for IR intervention. Of note, during the IV infusion, there was a period of time where the rate was set incorrectly at 6 mg per hour when it was actually the bolus dose, but this was corrected.  Recommendations: 1. HgbA1c, fasting lipid panel 2. MRI of the brain without contrast 3. Frequent neuro checks 4. Echocardiogram 5. CCM  consult 6. Prophylactic therapy-none for 24 hours.  7. Telemetry monitoring 8. PT consult, OT consult, Speech consult 9. ICU hyperglycemia protocol for DM 10. IV fluids given elevated creatinine 11. Stroke team to follow   This patient is critically ill and at significant risk of neurological worsening, death and care requires constant monitoring of vital signs, hemodynamics,respiratory and cardiac monitoring, neurological assessment, discussion with family, other specialists and medical decision making of high complexity. I spent 60 minutes of neurocritical care time  in the care of  this patient.  Roland Rack, MD [facility] [phone]  If 7pm- 7am, please page neurology on call as listed in [place]. 12/07/2016  11:04 PM

## 2016-12-12 NOTE — Anesthesia Procedure Notes (Signed)
Procedure Name: Intubation Date/Time: 11/28/2016 9:28 PM Performed by: Manuela Schwartz B Pre-anesthesia Checklist: Patient identified, Emergency Drugs available, Suction available, Patient being monitored and Timeout performed Patient Re-evaluated:Patient Re-evaluated prior to inductionOxygen Delivery Method: Circle system utilized Preoxygenation: Pre-oxygenation with 100% oxygen Intubation Type: IV induction, Rapid sequence and Cricoid Pressure applied Laryngoscope Size: Mac and 3 Grade View: Grade I Tube type: Subglottic suction tube Tube size: 7.5 mm Number of attempts: 1 Airway Equipment and Method: Stylet Placement Confirmation: ETT inserted through vocal cords under direct vision,  positive ETCO2 and breath sounds checked- equal and bilateral Secured at: 22 cm Tube secured with: Tape Dental Injury: Teeth and Oropharynx as per pre-operative assessment

## 2016-12-12 NOTE — Procedures (Signed)
Neuro-Interventional Radiology Post Cerebral Angiogram Procedure Note  History:   81 yo female presents to [facility] ED via EMS with acute onset of left hemiplegia.    Last known well at 7:30pm.   CT head neg for hemorrhage.  IV tPA given.    CT with dense right MCA, with ELVO confirmed on CTA.    Discussed with Dr. Leonel Ramsay.  NIHSS ~21.   ASPECTS = 10.      Baseline MRS: 2 NIHSS:   ~21 Last Known Well:  7:30pm 12/02/2016 ASPECTS:   10 Anesthesia    GETA Skin Puncture:   9:38pm IV tPA:    yes Proximal or Distal:  Proximal Right MCA occlusion Post TICI Score:  3 Device:   First: ADAPT technique with penumbra (2254, 2303), SOLUMBRA technique, Solitaire & penumbra (2324)   Procedure:   Right CFA access Left CFA access for arterial line  Cerebral angio, with right CCA/ICA, and left CCA Mechanical thrombectomy of right MCA occlusion.  Penumbra, then Penumbra & solitaire  Findings:   Type 3 aortic arch Tortuous right carotid system.  Pre: occlusion right MCA, Post: full right MCA revascularization Moderate athero Patent Acomm, with dominant perfusion of left ACA territory via the right ICA Left ICA patent  Complications: None  Recommendations:  Discussed with Dr. Leonel Ramsay CT head now Admit, bed 62M-08 IV fluid for renal protection, 125cc/hr x 4 hours Maintain right CFA 553F sheath x 12 hours, then VIR tech will address with sheath pull Maintain left CFA 53F sheath for arterial line Maintain SBP 120-140 range  Signed,  Dulcy Fanny. Earleen Newport, DO

## 2016-12-12 NOTE — Anesthesia Preprocedure Evaluation (Addendum)
Anesthesia Evaluation  Patient identified by MRN, date of birth, ID band Patient confused    Reviewed: Allergy & Precautions, Patient's Chart, lab work & pertinent test resultsPreop documentation limited or incomplete due to emergent nature of procedure.  Airway Mallampati: II       Dental  (+) Teeth Intact, Dental Advisory Given   Pulmonary neg pulmonary ROS,    breath sounds clear to auscultation       Cardiovascular hypertension, + CAD, + CABG and + Peripheral Vascular Disease  + Valvular Problems/Murmurs  Rhythm:Regular Rate:Normal     Neuro/Psych PSYCHIATRIC DISORDERS Depression  Neuromuscular disease CVA    GI/Hepatic   Endo/Other  diabetes, Type 2, Insulin Dependent  Renal/GU CRFRenal disease  negative genitourinary   Musculoskeletal  (+) Arthritis , Osteoarthritis,    Abdominal   Peds negative pediatric ROS (+)  Hematology negative hematology ROS (+)   Anesthesia Other Findings   Reproductive/Obstetrics negative OB ROS                            Lab Results  Component Value Date   HGB 10.5 (L) 12/17/2016   HCT 31.0 (L) 11/27/2016   Lab Results  Component Value Date   CREATININE 1.80 (H) 12/19/2016   BUN 41 (H) 12/15/2016   NA 130 (L) 12/15/2016   K 6.3 (HH) 11/29/2016   CL 97 (L) 12/08/2016   No results found for: INR, PROTIME  EKG: normal sinus rhythm.   Anesthesia Physical Anesthesia Plan  ASA: IV and emergent  Anesthesia Plan: General   Post-op Pain Management:    Induction: Intravenous  Airway Management Planned: Oral ETT  Additional Equipment: Arterial line  Intra-op Plan: Delibrate Circulatory arrest per surgeon request  Post-operative Plan: Post-operative intubation/ventilation  Informed Consent: I have reviewed the patients History and Physical, chart, labs and discussed the procedure including the risks, benefits and alternatives for the proposed  anesthesia with the patient or authorized representative who has indicated his/her understanding and acceptance.   Only emergency history available and History available from chart only  Plan Discussed with: CRNA  Anesthesia Plan Comments:        Anesthesia Quick Evaluation

## 2016-12-12 NOTE — ED Provider Notes (Signed)
[facility] DEPT Provider Note   CSN: [id number] Arrival date & time: 12/04/2016  2006     History   Chief Complaint Chief Complaint  Patient presents with  . Code Stroke    HPI Julia Madden is a 80 y.o. female.  Patient presents as code stroke after family reported sudden onset stroke symptoms at 7:30 this evening. Patient has had a stroke in the past. Patient had headache in addition to left-sided weakness with right gaze. Difficulty obtaining details from patient due to presentation and acuity. Patient indirectly CT scanner.      Past Medical History:  Diagnosis Date  . Arthritis   . 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 CAD (coronary artery disease)   . CKD (chronic kidney disease)   . CVA (cerebral vascular accident) ([place])   . Depression   . Diabetes mellitus without complication ([place])   . Diabetic polyneuropathy ([place])   . Hyperlipidemia   . Hypertension   . Incontinence   . Long term current use of insulin ([place])   . Long-term (current) use of anticoagulants   . Mass of heart   . Obstructive uropathy    WITH STENT PLACEMENT  . Stroke [facility])    LMCA  . Systolic murmur     Patient Active Problem List   Diagnosis Date Noted  . Stroke (cerebrum) ([place]) 12/01/2016  . Coronary artery disease involving native coronary artery of native heart without angina pectoris 11/09/2016  . Nonrheumatic aortic valve stenosis 11/09/2016  . DOE (dyspnea on exertion) 11/09/2016  . Atrial myxoma 11/09/2016  . Paroxysmal atrial fibrillation ([place]) 11/09/2016  . Cerebrovascular accident (CVA) ([place]) 11/09/2016  . Poorly controlled type 2 diabetes mellitus with circulatory disorder ([place]) 11/09/2016    Past Surgical History:  Procedure Laterality Date  . AMPUTATION TOE Left 2015   LEFT FOOT TOE   . CORONARY ARTERY BYPASS GRAFT  2010  . EXCISION MYXOMA  07/2016   WITH PFO CLOSURE  . REPLACEMENT TOTAL KNEE Left   . URINARY STENT  2017    OB History    No  data available       Home Medications    Prior to Admission medications   Medication Sig Start Date End Date Taking? Authorizing Provider  acetaminophen (TYLENOL) 325 MG tablet Take 650 mg by mouth every 6 (six) hours as needed.    Historical Provider, MD  amiodarone (PACERONE) 200 MG tablet Take 1 tablet (200 mg total) by mouth daily. 12/04/16   Jettie Booze, MD  AMLODIPINE BESYLATE PO Take 2.5 mg by mouth.    Historical Provider, MD  aspirin EC 81 MG tablet Take 81 mg by mouth daily.    Historical Provider, MD  atorvastatin (LIPITOR) 20 MG tablet Take 1 tablet (20 mg total) by mouth daily. 12/04/16   Jettie Booze, MD  clopidogrel (PLAVIX) 75 MG tablet Take 75 mg by mouth daily.    Historical Provider, MD  DULoxetine (CYMBALTA) 60 MG capsule Take 60 mg by mouth daily.    Historical Provider, MD  insulin glargine (LANTUS) 100 UNIT/ML injection Inject 0.12 mLs (12 Units total) into the skin at bedtime. 12/04/16   Philemon Kingdom, MD  insulin lispro (HUMALOG) 100 UNIT/ML injection Inject 0.01-0.03 mLs (1-3 Units total) into the skin as needed for high blood sugar. 12/04/16   Jettie Booze, MD  Insulin Pen Needle (CAREFINE PEN NEEDLES) 32G X 4 MM MISC Use 4x a day 12/03/16  Philemon Kingdom, MD  insulin regular (NOVOLIN R,HUMULIN R) 250 units/2.3mL (100 units/mL) injection Inject into the skin 3 (three) times daily before meals.    Historical Provider, MD  metoprolol tartrate (LOPRESSOR) 25 MG tablet Take 1 tablet (25 mg total) by mouth 2 (two) times daily. 12/04/16   Jettie Booze, MD  Probiotic Product (PROBIOTIC DAILY PO) Take by mouth.    Historical Provider, MD    Family History Family History  Problem Relation Age of Onset  . Heart disease Mother   . CVA Mother   . Breast cancer Sister   . Pneumonia Brother   . Diabetes Brother     dialysis  . Asthma Sister     Social History Social History  Substance Use Topics  . Smoking status: Never Smoker  .  Smokeless tobacco: Never Used  . Alcohol use No     Allergies   Patient has no known allergies.   Review of Systems Review of Systems  Unable to perform ROS: Acuity of condition     Physical Exam Updated Vital Signs BP (!) 153/108   Pulse 78   Resp 18   Ht 5\' 4"  (1.626 m)   Wt 148 lb 2.4 oz (67.2 kg)   LMP  (LMP Unknown)   SpO2 98%   BMI 25.43 kg/m   Physical Exam  Constitutional: She appears well-developed and well-nourished.  HENT:  Head: Normocephalic and atraumatic.  Eyes: Conjunctivae are normal. Right eye exhibits no discharge. Left eye exhibits no discharge.  Neck: Normal range of motion. Neck supple. No tracheal deviation present.  Cardiovascular: Normal rate.   Pulmonary/Chest: Effort normal.  Abdominal: Soft. She exhibits no distension. There is no tenderness. There is no guarding.  Musculoskeletal: She exhibits no edema.  Neurological: She is alert. A cranial nerve deficit is present. GCS eye subscore is 4. GCS verbal subscore is 5. GCS motor subscore is 6.  Right gaze, left weakness UE and LE, see neuro note for details.   Skin: Skin is warm. No rash noted.  Psychiatric: She has a normal mood and affect.  Nursing note and vitals reviewed.    ED Treatments / Results  Labs (all labs ordered are listed, but only abnormal results are displayed) Labs Reviewed  CBC - Abnormal; Notable for the following:       Result Value   RBC 3.38 (*)    Hemoglobin 9.1 (*)    HCT 29.0 (*)    RDW 16.3 (*)    Platelets 433 (*)    All other components within normal limits  CBG MONITORING, ED - Abnormal; Notable for the following:    Glucose-Capillary 381 (*)    All other components within normal limits  I-STAT CHEM 8, ED - Abnormal; Notable for the following:    Sodium 130 (*)    Potassium 6.3 (*)    Chloride 97 (*)    BUN 41 (*)    Creatinine, Ser 1.80 (*)    Glucose, Bld 413 (*)    Calcium, Ion 1.01 (*)    Hemoglobin 10.5 (*)    HCT 31.0 (*)    All other  components within normal limits  PROTIME-INR  APTT  DIFFERENTIAL  COMPREHENSIVE METABOLIC PANEL  I-STAT TROPOININ, ED  CBG MONITORING, ED    EKG  EKG Interpretation  Date/Time:  Saturday December 12 2016 20:51:48 EST Ventricular Rate:  72 PR Interval:    QRS Duration: 117 QT Interval:  445 QTC Calculation: 487 R Axis:  114 Text Interpretation:  Sinus rhythm Atrial premature complexes Nonspecific intraventricular conduction delay Anteroseptal infarct, age indeterminate Confirmed by Chasyn Cinque MD, Sheilia Reznick [phone]) on 11/27/2016 9:20:02 PM       Radiology Ct Angio Head W Or Wo Contrast  Result Date: 12/01/2016 CLINICAL DATA:  Left-sided weakness. Dens right MCA on noncontrast CT. EXAM: CT ANGIOGRAPHY HEAD AND NECK TECHNIQUE: Multidetector CT imaging of the head and neck was performed using the standard protocol during bolus administration of intravenous contrast. Multiplanar CT image reconstructions and MIPs were obtained to evaluate the vascular anatomy. Carotid stenosis measurements (when applicable) are obtained utilizing NASCET criteria, using the distal internal carotid diameter as the denominator. CONTRAST:  50 mL Isovue 370 COMPARISON:  None. FINDINGS: CTA NECK FINDINGS Aortic arch: Normal variant 4 vessel aortic arch with the left vertebral artery arising directly from the arch between the left common carotid and left subclavian arteries. Mild calcified plaque in the proximal left subclavian artery without stenosis. Widely patent brachiocephalic and right subclavian arteries. Right carotid system: Patent with moderate calcified plaque about the bifurcation but without significant stenosis. Left carotid system: Patent with mild calcified plaque at the bifurcation. No stenosis. Vertebral arteries: Patent and codominant without evidence of dissection or significant stenosis. Mild focal calcified plaque in the proximal left V1 segment. Skeleton: Advanced disc degeneration at C5-6 with mild disc  degeneration elsewhere in the cervical spine. Multilevel facet arthrosis, severe on the right at C3-4. Grade 1 anterolisthesis of C7 on T1, facet mediated. Other neck: Bilateral thyroid nodules, some of which are calcified. Largest nodule measures 1.3 cm on the right, below the threshold for recommendation of routine ultrasound follow-up. Upper chest: Mild mosaic attenuation in the right upper lobe, possibly air trapping. Review of the MIP images confirms the above findings CTA HEAD FINDINGS Anterior circulation: The internal carotid arteries are patent from skullbase to carotid termini with diffuse siphon atherosclerosis. There is mild bilateral supraclinoid stenosis. The right MCA is occluded at the mid to distal M1 level without significant branch vessel reconstitution. ACAs and left MCA are patent without evidence of major branch occlusion or significant proximal stenosis. The left A1 segment is severely hypoplastic, with the left A2 predominantly supplied from the right via the anterior communicating artery. No aneurysm. Posterior circulation: The intracranial vertebral arteries are patent with mild-to-moderate calcified plaque but no significant stenosis. PICA and SCA origins are patent. Basilar artery is widely patent. Posterior communicating arteries are diminutive or absent. PCAs are patent with mild atherosclerotic irregularity bilaterally but no significant proximal stenosis. No aneurysm. Venous sinuses: Not adequately assessed due to arterial phase contrast timing. Anatomic variants: Hypoplastic left A1. Review of the MIP images confirms the above findings IMPRESSION: 1. Right M1 occlusion. 2. Intracranial atherosclerosis with mild bilateral ICA stenosis. 3. Atherosclerosis about the right greater than left carotid bifurcations in the neck without significant stenosis. These results were called by telephone at the time of interpretation on 12/22/2016 at 9:15 pm to Dr. Roland Rack , who verbally  acknowledged these results. Electronically Signed   By: Logan Bores M.D.   On: 11/25/2016 21:27   Ct Angio Neck W Or Wo Contrast  Result Date: 12/07/2016 CLINICAL DATA:  Left-sided weakness. Dens right MCA on noncontrast CT. EXAM: CT ANGIOGRAPHY HEAD AND NECK TECHNIQUE: Multidetector CT imaging of the head and neck was performed using the standard protocol during bolus administration of intravenous contrast. Multiplanar CT image reconstructions and MIPs were obtained to evaluate the vascular anatomy. Carotid stenosis measurements (when  applicable) are obtained utilizing NASCET criteria, using the distal internal carotid diameter as the denominator. CONTRAST:  50 mL Isovue 370 COMPARISON:  None. FINDINGS: CTA NECK FINDINGS Aortic arch: Normal variant 4 vessel aortic arch with the left vertebral artery arising directly from the arch between the left common carotid and left subclavian arteries. Mild calcified plaque in the proximal left subclavian artery without stenosis. Widely patent brachiocephalic and right subclavian arteries. Right carotid system: Patent with moderate calcified plaque about the bifurcation but without significant stenosis. Left carotid system: Patent with mild calcified plaque at the bifurcation. No stenosis. Vertebral arteries: Patent and codominant without evidence of dissection or significant stenosis. Mild focal calcified plaque in the proximal left V1 segment. Skeleton: Advanced disc degeneration at C5-6 with mild disc degeneration elsewhere in the cervical spine. Multilevel facet arthrosis, severe on the right at C3-4. Grade 1 anterolisthesis of C7 on T1, facet mediated. Other neck: Bilateral thyroid nodules, some of which are calcified. Largest nodule measures 1.3 cm on the right, below the threshold for recommendation of routine ultrasound follow-up. Upper chest: Mild mosaic attenuation in the right upper lobe, possibly air trapping. Review of the MIP images confirms the above  findings CTA HEAD FINDINGS Anterior circulation: The internal carotid arteries are patent from skullbase to carotid termini with diffuse siphon atherosclerosis. There is mild bilateral supraclinoid stenosis. The right MCA is occluded at the mid to distal M1 level without significant branch vessel reconstitution. ACAs and left MCA are patent without evidence of major branch occlusion or significant proximal stenosis. The left A1 segment is severely hypoplastic, with the left A2 predominantly supplied from the right via the anterior communicating artery. No aneurysm. Posterior circulation: The intracranial vertebral arteries are patent with mild-to-moderate calcified plaque but no significant stenosis. PICA and SCA origins are patent. Basilar artery is widely patent. Posterior communicating arteries are diminutive or absent. PCAs are patent with mild atherosclerotic irregularity bilaterally but no significant proximal stenosis. No aneurysm. Venous sinuses: Not adequately assessed due to arterial phase contrast timing. Anatomic variants: Hypoplastic left A1. Review of the MIP images confirms the above findings IMPRESSION: 1. Right M1 occlusion. 2. Intracranial atherosclerosis with mild bilateral ICA stenosis. 3. Atherosclerosis about the right greater than left carotid bifurcations in the neck without significant stenosis. These results were called by telephone at the time of interpretation on 12/06/2016 at 9:15 pm to Dr. Roland Rack , who verbally acknowledged these results. Electronically Signed   By: Logan Bores M.D.   On: 12/08/2016 21:27   Ct Head Code Stroke W/o Cm  Addendum Date: 12/22/2016   ADDENDUM REPORT: 11/28/2016 20:57 ADDENDUM: Chronic appearing left cerebellar and right occipital lobe infarcts. Electronically Signed   By: Logan Bores M.D.   On: 12/03/2016 20:57   Result Date: 12/04/2016 CLINICAL DATA:  Code stroke.  Left-sided weakness. EXAM: CT HEAD WITHOUT CONTRAST TECHNIQUE: Contiguous  axial images were obtained from the base of the skull through the vertex without intravenous contrast. COMPARISON:  None. FINDINGS: Brain: A right occipital lobe infarct is present and is favored to be chronic. There is a small chronic appearing left cerebellar infarct. Elsewhere, there is no evidence of acute cortical infarct, intracranial hemorrhage, mass, midline shift, or extra-axial fluid collection. There is moderate cerebral atrophy. Cerebral white matter hypodensities are nonspecific but compatible with moderate chronic small vessel ischemic disease. A mega cisterna magna is incidentally noted. Vascular: Calcified atherosclerosis at the skullbase. Dense right MCA. Skull: No fracture or focal osseous lesion. Sinuses/Orbits: The  visualized paranasal sinuses and mastoid air cells are clear. Prior bilateral cataract extraction is noted. Other: None. ASPECTS [facility] Stroke Program Early CT Score) - Ganglionic level infarction (caudate, lentiform nuclei, internal capsule, insula, M1-M3 cortex): 7 - Supraganglionic infarction (M4-M6 cortex): 3 Total score (0-10 with 10 being normal): 10 IMPRESSION: 1. No acute intracranial hemorrhage or definite acute infarct identified. 2. ASPECTS is 10. 3. Dense right MCA concerning for acute thrombus. 4. Moderate chronic small vessel ischemic disease and cerebral atrophy. These results were called by telephone at the time of interpretation on 12/03/2016 at 8:28 pm to Dr. Leonel Ramsay, who verbally acknowledged these results. Electronically Signed: By: Logan Bores M.D. On: 12/09/2016 20:33    Procedures Procedures (including critical care time) CRITICAL CARE Performed by: Mariea Clonts   Total critical care time: 35 minutes  Critical care time was exclusive of separately billable procedures and treating other patients.  Critical care was necessary to treat or prevent imminent or life-threatening deterioration.  Critical care was time spent personally by me on  the following activities: development of treatment plan with patient and/or surrogate as well as nursing, discussions with consultants, evaluation of patient's response to treatment, examination of patient, obtaining history from patient or surrogate, ordering and performing treatments and interventions, ordering and review of laboratory studies, ordering and review of radiographic studies, pulse oximetry and re-evaluation of patient's condition.  Medications Ordered in ED Medications  insulin aspart (novoLOG) 100 UNIT/ML injection (not administered)  alteplase (ACTIVASE) 1 mg/mL infusion 60 mg (6 mg Intravenous New Bag/Given 11/28/2016 2105)  iopamidol (ISOVUE-370) 76 % injection (50 mLs  Contrast Given 12/21/2016 2045)  sodium chloride 0.9 % bolus 500 mL (500 mLs Intravenous New Bag/Given 12/03/2016 2107)  calcium gluconate 1 g in sodium chloride 0.9 % 100 mL IVPB (1 g Intravenous New Bag/Given 11/29/2016 2106)  insulin aspart (novoLOG) injection 8 Units (8 Units Intravenous Given 12/02/2016 2053)     Initial Impression / Assessment and Plan / ED Course  I have reviewed the triage vital signs and the nursing notes.  Pertinent labs & imaging results that were available during my care of the patient were reviewed by me and considered in my medical decision making (see chart for details).   patient presents code stroke with left-sided deficits in right gaze. Patient alert and oriented difficulty with speech due to acute stroke. Discussed with neurology in CT scanner and at the bedside. Mild hyperkalemia without EKG changes insulin calcium ordered. Plan for TPA and patient will go to IR once the team arrives. Discussed this with neurology and patient's son at the bedside.  The patients results and plan were reviewed and discussed.   Any x-rays performed were independently reviewed by myself.   Differential diagnosis were considered with the presenting HPI.  Medications  insulin aspart (novoLOG) 100 UNIT/ML  injection (not administered)  alteplase (ACTIVASE) 1 mg/mL infusion 60 mg (6 mg Intravenous New Bag/Given 12/07/2016 2105)  iopamidol (ISOVUE-370) 76 % injection (50 mLs  Contrast Given 12/03/2016 2045)  sodium chloride 0.9 % bolus 500 mL (500 mLs Intravenous New Bag/Given 12/04/2016 2107)  calcium gluconate 1 g in sodium chloride 0.9 % 100 mL IVPB (1 g Intravenous New Bag/Given 11/24/2016 2106)  insulin aspart (novoLOG) injection 8 Units (8 Units Intravenous Given 12/11/2016 2053)    Vitals:   12/11/2016 2038 12/11/2016 2049 12/08/2016 2100 12/08/2016 2112  BP: 166/75 172/70 (!) 153/108   Pulse: 78     Resp: 20  18  SpO2: 98%   98%  Weight:      Height:        Final diagnoses:  Stroke (cerebrum) ([place])  Stroke [facility])    Admission/ observation were discussed with the admitting physician, patient and/or family and they are comfortable with the plan.    Final Clinical Impressions(s) / ED Diagnoses   Final diagnoses:  Stroke (cerebrum) ([place])  Stroke [facility])    New Prescriptions New Prescriptions   No medications on file     Elnora Morrison, MD 12/11/2016 2133

## 2016-12-12 NOTE — ED Notes (Signed)
Foley catheter in place from home.  Changed from leg bag to bedside drainage bag.  Urine noted to be pink/milky in color.

## 2016-12-13 ENCOUNTER — Encounter (HOSPITAL_COMMUNITY): Payer: Self-pay | Admitting: Interventional Radiology

## 2016-12-13 ENCOUNTER — Inpatient Hospital Stay (HOSPITAL_COMMUNITY): Payer: Medicare Other

## 2016-12-13 ENCOUNTER — Other Ambulatory Visit (HOSPITAL_COMMUNITY): Payer: Medicare Other

## 2016-12-13 DIAGNOSIS — I63311 Cerebral infarction due to thrombosis of right middle cerebral artery: Secondary | ICD-10-CM

## 2016-12-13 DIAGNOSIS — L8990 Pressure ulcer of unspecified site, unspecified stage: Secondary | ICD-10-CM | POA: Insufficient documentation

## 2016-12-13 DIAGNOSIS — J9600 Acute respiratory failure, unspecified whether with hypoxia or hypercapnia: Secondary | ICD-10-CM

## 2016-12-13 DIAGNOSIS — G936 Cerebral edema: Secondary | ICD-10-CM

## 2016-12-13 LAB — LIPID PANEL
Cholesterol: 105 mg/dL (ref 0–200)
HDL: 32 mg/dL — AB (ref >40)
LDL CALC: 45 mg/dL (ref 0–99)
TRIGLYCERIDES: 138 mg/dL (ref <150)
Total CHOL/HDL Ratio: 3.3 RATIO
VLDL: 28 mg/dL (ref 0–40)

## 2016-12-13 LAB — BLOOD GAS, ARTERIAL
ACID-BASE DEFICIT: 5 mmol/L — AB (ref 0.0–2.0)
BICARBONATE: 19.6 mmol/L — AB (ref 20.0–28.0)
Drawn by: 40415
FIO2: 60
LHR: 15 {breaths}/min
O2 Saturation: 98.6 %
PEEP: 5 cmH2O
Patient temperature: 97.6
VT: 500 mL
pCO2 arterial: 35.1 mmHg (ref 32.0–48.0)
pH, Arterial: 7.362 (ref 7.350–7.450)
pO2, Arterial: 166 mmHg — ABNORMAL HIGH (ref 83.0–108.0)

## 2016-12-13 LAB — GLUCOSE, CAPILLARY
GLUCOSE-CAPILLARY: 132 mg/dL — AB (ref 65–99)
GLUCOSE-CAPILLARY: 169 mg/dL — AB (ref 65–99)
GLUCOSE-CAPILLARY: 390 mg/dL — AB (ref 65–99)
GLUCOSE-CAPILLARY: 90 mg/dL (ref 65–99)
Glucose-Capillary: 372 mg/dL — ABNORMAL HIGH (ref 65–99)
Glucose-Capillary: 160 mg/dL — ABNORMAL HIGH (ref 65–99)
Glucose-Capillary: 93 mg/dL (ref 65–99)

## 2016-12-13 LAB — IRON AND TIBC
Iron: 10 ug/dL — ABNORMAL LOW (ref 28–170)
SATURATION RATIOS: 5 % — AB (ref 10.4–31.8)
TIBC: 207 ug/dL — AB (ref 250–450)
UIBC: 197 ug/dL

## 2016-12-13 LAB — MRSA PCR SCREENING: MRSA BY PCR: NEGATIVE

## 2016-12-13 LAB — SODIUM: Sodium: 137 mmol/L (ref 135–145)

## 2016-12-13 LAB — FERRITIN: Ferritin: 208 ng/mL (ref 11–307)

## 2016-12-13 MED ORDER — ACETAMINOPHEN 650 MG RE SUPP: 650.0000 mg | RECTAL | Status: DC | PRN

## 2016-12-13 MED ORDER — SODIUM CHLORIDE 0.9 % IV SOLN
INTRAVENOUS | Status: DC
  Filled 2016-12-13: qty 2.5

## 2016-12-13 MED ORDER — ACETAMINOPHEN 325 MG PO TABS: 650.0000 mg | ORAL_TABLET | ORAL | Status: DC | PRN

## 2016-12-13 MED ORDER — MIDAZOLAM HCL 2 MG/2ML IJ SOLN: 1.0000 mg | INTRAMUSCULAR | Status: DC | PRN

## 2016-12-13 MED ORDER — HYDRALAZINE HCL 20 MG/ML IJ SOLN
10.0000 mg | INTRAMUSCULAR | Status: DC | PRN
  Filled 2016-12-13: qty 1

## 2016-12-13 MED ORDER — ONDANSETRON HCL 4 MG/2ML IJ SOLN: 4.0000 mg | Freq: Four times a day (QID) | INTRAMUSCULAR | Status: DC | PRN

## 2016-12-13 MED ORDER — METOPROLOL TARTRATE 5 MG/5ML IV SOLN
2.5000 mg | Freq: Four times a day (QID) | INTRAVENOUS | Status: DC
  Administered 2016-12-13 – 2016-12-15 (×9): 2.5 mg via INTRAVENOUS
  Filled 2016-12-13 (×7): qty 5

## 2016-12-13 MED ORDER — PANTOPRAZOLE SODIUM 40 MG IV SOLR
40.0000 mg | Freq: Every day | INTRAVENOUS | Status: DC
Start: 2016-12-13 — End: 2016-12-15
  Administered 2016-12-13 – 2016-12-14 (×3): 40 mg via INTRAVENOUS
  Filled 2016-12-13 (×3): qty 40

## 2016-12-13 MED ORDER — STROKE: EARLY STAGES OF RECOVERY BOOK
Freq: Once | Status: AC
  Administered 2016-12-13: 05:00:00
  Filled 2016-12-13 (×2): qty 1

## 2016-12-13 MED ORDER — SODIUM CHLORIDE 0.9% FLUSH: 10.0000 mL | INTRAVENOUS | Status: DC | PRN

## 2016-12-13 MED ORDER — INSULIN GLARGINE 100 UNIT/ML ~~LOC~~ SOLN
12.0000 [IU] | Freq: Every day | SUBCUTANEOUS | Status: DC
  Administered 2016-12-13 – 2016-12-14 (×2): 12 [IU] via SUBCUTANEOUS
  Filled 2016-12-13 (×3): qty 0.12

## 2016-12-13 MED ORDER — PROPOFOL 1000 MG/100ML IV EMUL: 0.0000 ug/kg/min | INTRAVENOUS | Status: DC

## 2016-12-13 MED ORDER — FAMOTIDINE IN NACL 20-0.9 MG/50ML-% IV SOLN
20.0000 mg | Freq: Two times a day (BID) | INTRAVENOUS | Status: DC
  Administered 2016-12-13 – 2016-12-14 (×4): 20 mg via INTRAVENOUS
  Filled 2016-12-13 (×4): qty 50

## 2016-12-13 MED ORDER — FENTANYL CITRATE (PF) 100 MCG/2ML IJ SOLN: 50.0000 ug | INTRAMUSCULAR | Status: DC | PRN

## 2016-12-13 MED ORDER — SODIUM CHLORIDE 0.9 % IV SOLN: INTRAVENOUS | Status: DC

## 2016-12-13 MED ORDER — ACETAMINOPHEN 160 MG/5ML PO SOLN: 650.0000 mg | ORAL | Status: DC | PRN

## 2016-12-13 MED ORDER — FENTANYL BOLUS VIA INFUSION
25.0000 ug | INTRAVENOUS | Status: DC | PRN
  Filled 2016-12-13: qty 25

## 2016-12-13 MED ORDER — DOCUSATE SODIUM 50 MG/5ML PO LIQD: 100.0000 mg | Freq: Two times a day (BID) | ORAL | Status: DC | PRN

## 2016-12-13 MED ORDER — HYDRALAZINE HCL 20 MG/ML IJ SOLN
INTRAMUSCULAR | Status: AC
  Administered 2016-12-13: 20 mg
  Filled 2016-12-13: qty 1

## 2016-12-13 MED ORDER — SODIUM CHLORIDE 0.9 % IV SOLN: INTRAVENOUS | Status: AC

## 2016-12-13 MED ORDER — INSULIN ASPART 100 UNIT/ML ~~LOC~~ SOLN: 2.0000 [IU] | SUBCUTANEOUS | Status: DC

## 2016-12-13 MED ORDER — SODIUM CHLORIDE 0.9% FLUSH
10.0000 mL | Freq: Two times a day (BID) | INTRAVENOUS | Status: DC
  Administered 2016-12-13: 10 mL
  Administered 2016-12-14: 20 mL
  Administered 2016-12-15: 10 mL

## 2016-12-13 MED ORDER — ORAL CARE MOUTH RINSE
15.0000 mL | OROMUCOSAL | Status: DC
  Administered 2016-12-13 – 2016-12-15 (×21): 15 mL via OROMUCOSAL

## 2016-12-13 MED ORDER — INSULIN ASPART 100 UNIT/ML ~~LOC~~ SOLN
0.0000 [IU] | SUBCUTANEOUS | Status: DC
  Administered 2016-12-13: 3 [IU] via SUBCUTANEOUS
  Administered 2016-12-13: 4 [IU] via SUBCUTANEOUS
  Administered 2016-12-13 (×2): 20 [IU] via SUBCUTANEOUS
  Administered 2016-12-14: 4 [IU] via SUBCUTANEOUS
  Administered 2016-12-15: 3 [IU] via SUBCUTANEOUS

## 2016-12-13 MED ORDER — FENTANYL CITRATE (PF) 100 MCG/2ML IJ SOLN: 50.0000 ug | Freq: Once | INTRAMUSCULAR | Status: DC

## 2016-12-13 MED ORDER — CHLORHEXIDINE GLUCONATE 0.12% ORAL RINSE (MEDLINE KIT)
15.0000 mL | Freq: Two times a day (BID) | OROMUCOSAL | Status: DC
  Administered 2016-12-13 – 2016-12-15 (×5): 15 mL via OROMUCOSAL

## 2016-12-13 MED ORDER — NICARDIPINE HCL IN NACL 20-0.86 MG/200ML-% IV SOLN
5.0000 mg/h | INTRAVENOUS | Status: DC
  Administered 2016-12-13 (×2): 5 mg/h via INTRAVENOUS
  Filled 2016-12-13 (×3): qty 200

## 2016-12-13 MED ORDER — BISACODYL 10 MG RE SUPP: 10.0000 mg | Freq: Every day | RECTAL | Status: DC | PRN

## 2016-12-13 MED ORDER — SODIUM CHLORIDE 3 % IV SOLN
INTRAVENOUS | Status: DC
  Administered 2016-12-13: 75 mL/h via INTRAVENOUS
  Administered 2016-12-14: 60 mL/h via INTRAVENOUS
  Filled 2016-12-13 (×7): qty 500

## 2016-12-13 MED ORDER — FENTANYL CITRATE (PF) 2500 MCG/50ML IJ SOLN
25.0000 ug/h | INTRAMUSCULAR | Status: DC
  Administered 2016-12-13: 25 ug/h via INTRAVENOUS
  Administered 2016-12-14: 50 ug/h via INTRAVENOUS
  Filled 2016-12-13 (×2): qty 50

## 2016-12-13 NOTE — Progress Notes (Signed)
eLink Physician-Brief Progress Note Patient Name: Julia Madden DOB: 09-08-1936 MRN: [id number]   Date of Service  12/13/2016  HPI/Events of Note  CBGs high Cannot start insulin gtt  eICU Interventions  SSI - resistant scale lantus 12 u now (home dose)     Intervention Category Intermediate Interventions: Hyperglycemia - evaluation and treatment  ALVA,RAKESH V. 12/13/2016, 4:12 AM

## 2016-12-13 NOTE — Transfer of Care (Signed)
Immediate Anesthesia Transfer of Care Note  Patient: Julia Madden  Procedure(s) Performed: Procedure(s): RADIOLOGY WITH ANESTHESIA (N/A)  Patient Location: ICU  Anesthesia Type:General  Level of Consciousness: Patient remains intubated per anesthesia plan  Airway & Oxygen Therapy: Patient remains intubated per anesthesia plan and Patient placed on Ventilator (see vital sign flow sheet for setting)  Post-op Assessment: Report given to RN and Post -op Vital signs reviewed and stable  Post vital signs: Reviewed and stable  Last Vitals:  Vitals:   12/05/2016 2049 12/22/2016 2100  BP: 172/70 (!) 153/108  Pulse:    Resp:  18    Last Pain: There were no vitals filed for this visit.       Complications: No apparent anesthesia complications

## 2016-12-13 NOTE — Progress Notes (Addendum)
STROKE TEAM PROGRESS NOTE   HISTORY OF PRESENT ILLNESS (per record) Julia Madden is a 81 y.o. female with a history of previous stroke with mild deficits who presents with acute onset left-sided weakness. Her previous stroke happened in the setting of cardiac surgery and she improved very quickly, but was debilitated because of the cardiac surgery and therefore did have to rehabilitate following this. Currently she is able to take care of all of her own activities of daily living, going up and down stairs, but does occasionally have an aide that comes and helps with chores.  LKW: 7:30 PM tpa given?: yes Modified Rankin score: 2   SUBJECTIVE (INTERVAL HISTORY) No family members present. The patient remains intubated.Neurologically stable overnight. Patient is able to follow commands. She still has bilateral groin sheaths. Follow-up CT scan postprocedure shows large right MCA infarct. Blood pressure has been adequately controlled. Her blood sugars have been quite high   OBJECTIVE Temp:  [95.8 F (35.4 C)-97.6 F (36.4 C)] 95.8 F (35.4 C) (01/21 0400) Pulse Rate:  [49-78] 77 (01/21 0715) Resp:  [0-25] 18 (01/21 0715) BP: (93-172)/(39-108) 112/50 (01/21 0715) SpO2:  [98 %-100 %] 100 % (01/21 0715) Arterial Line BP: (90-210)/(43-103) 108/103 (01/21 0715) FiO2 (%):  [50 %-100 %] 50 % (01/21 0348) Weight:  [67.2 kg (148 lb 2.4 oz)] 67.2 kg (148 lb 2.4 oz) (01/20 2029)  CBC:  Recent Labs Lab 11/24/2016 2023 11/27/2016 2100  WBC  --  8.4  NEUTROABS  --  6.4  HGB 10.5* 9.1*  HCT 31.0* 29.0*  MCV  --  85.8  PLT  --  433*    Basic Metabolic Panel:  Recent Labs Lab 11/27/2016 2023 12/15/2016 2100  NA 130* 133*  K 6.3* 5.0  CL 97* 99*  CO2  --  22  GLUCOSE 413* 399*  BUN 41* 28*  CREATININE 1.80* 1.83*  CALCIUM  --  8.5*    Lipid Panel:    Component Value Date/Time   CHOL 105 12/13/2016 0211   TRIG 138 12/13/2016 0211   HDL 32 (L) 12/13/2016 0211   CHOLHDL 3.3  12/13/2016 0211   VLDL 28 12/13/2016 0211   LDLCALC 45 12/13/2016 0211   HgbA1c: No results found for: HGBA1C Urine Drug Screen: No results found for: LABOPIA, COCAINSCRNUR, LABBENZ, AMPHETMU, THCU, LABBARB    IMAGING   CT Head Wo Contrast 12/13/2016 Status post right MCA thrombectomy. Swelling in the right MCA territory with slight hyperdensity.  Some of this hyperdensity could be contrast staining, but some could also represent petechial blood products.  The degree of swelling raises concern for the possibility of some cerebral infarction, though again, some edema can be seen following intervention.    Ct Angio Head and Neck  W Or Wo Contrast 12/18/2016 1. Right M1 occlusion.  2. Intracranial atherosclerosis with mild bilateral ICA stenosis.  3. Atherosclerosis about the right greater than left carotid bifurcations in the neck without significant stenosis.    Ir US Guide Vasc Access Right 12/13/2016 Status post right cerebral angiogram and mechanical thrombectomy of acute right MCA occlusion, with complete restoration of flow, TICI-3.  Signed, Dulcy Fanny. Earleen Newport, DO [facility]    Portable Chest Xray 12/13/2016 Pulmonary venous hypertension. Bibasilar atelectasis. No focal consolidation. Endotracheal tube in satisfactory position.    Ct Head Code Stroke W/o Cm Addendum Date: 12/11/2016   ADDENDUM REPORT: Chronic appearing left cerebellar and right occipital lobe infarcts.  11/27/2016 1. No  acute intracranial hemorrhage or definite acute infarct identified.  2. ASPECTS is 10.  3. Dense right MCA concerning for acute thrombus.  4. Moderate chronic small vessel ischemic disease and cerebral atrophy.      PHYSICAL EXAM  Elderly caucasian lady who is intubated. She has bilateral groin arterial sheaths. . Afebrile. Head is nontraumatic. Neck is supple without bruit.    Cardiac exam no murmur or gallop. Lungs are clear to  auscultation. Distal pulses are well felt.  Neurological Exam :  Awake alert follows commands well. Right gaze preference but can look to left past midline.dense left homonymous hemianopia. Fundi not visualized. Dense left hemiplegi 0/5 with hypotonia. Normal strength on left. No hemineglect. Diminished left hemibody sensation. Plantar down going on right and upgoing on the left. ASSESSMENT/PLAN Ms. Julia Madden is a 81 y.o. female with history of  previous strokes, hypertension, hyperlipidemia, diabetes mellitus, chronic kidney disease, coronary artery disease, and atrial myxoma excised with PFO closure, presenting with acute onset left-sided weakness. She received IV t-PA Saturday, 11/29/2016 at 2045 with subsequent mechanical thrombectomy of a right M1 occlusion with successful recanalization.  Stroke:  Non-dominant infarct embolic from an unknown source.  Resultant  Left hemiplegia and hemianopia  MRI - pending  MRA - not performed  CTA Head & Neck - Right M1 occlusion.   Carotid Doppler - CTA neck  2D Echo - pending  LDL - 45  HgbA1c - pending  VTE prophylaxis - SCDs  Diet NPO time specified  aspirin 81 mg daily and clopidogrel 75 mg daily prior to admission, now on No antithrombotic s/p TPA and thrombectomy.  Ongoing aggressive stroke risk factor management  Therapy recommendations:  pending  Disposition: Pending  Hypertension  Blood pressure mildly low - off Cardene  Permissive hypertension (OK if < 220/120) but gradually normalize in 5-7 days  Long-term BP goal normotensive  Hyperlipidemia  Home meds:  Lipitor 20 mg daily not resumed in hospital  LDL 45, goal < 70  Resume Lipitor when PO access is available.  Continue statin at discharge  Diabetes  HgbA1c pending, goal < 7.0  Uncontrolled  Other Stroke Risk Factors  Advanced age  Hx stroke/TIA  Family hx stroke (mother)  Coronary artery disease   Other Active Problems  Hyponatremia  - 133  Anemia - 9.1 / 29  Renal insufficiency - 28 / 1.83  PLAN  Repeat imaging 24 hours after TPA  Antiplatelet therapy if no hemorrhage 24 hours post TPA  Patient for PICC line placement  Needs better glucose control  PCCM to manage ventilator and sugar control  Hospital day # [place] PA-C [facility] Pager [phone] 12/13/2016, 11:59 AM I have personally examined this patient, reviewed notes, independently viewed imaging studies, participated in medical decision making and plan of care.ROS completed by me personally and pertinent positives fully documented  I have made any additions or clarifications directly to the above note. Agree with note above. She presented with large right MCA infarct due to right M1 occlusion and underwent thrombolysis with IV TPA as well as mechanical embolectomy with good recannulization but follow-up imaging that suggested large infarct. Recommend continue strict blood pressure control and aggressive sugar control. Discontinue groin arterial sheaths and follow-up imaging study later today and consider extubation per pulmonary critical care team. Family not available at the bedside for discussion. This patient is critically ill and at significant risk of neurological worsening, death and care requires constant monitoring of  vital signs, hemodynamics,respiratory and cardiac monitoring, extensive review of multiple databases, frequent neurological assessment, discussion with family, other specialists and medical decision making of high complexity.I have made any additions or clarifications directly to the above note.This critical care time does not reflect procedure time, or teaching time or supervisory time of PA/NP/Med Resident etc but could involve care discussion time.  I spent 40 minutes of neurocritical care time  in the care of  this patient.      Antony Contras, MD Medical Director [facility] Pager:  [phone] 12/13/2016 12:54 PM  ADDENDUM : MRI brain reviewed personally and shows large RMCA hemorrhagic infarct with cytotoxic edema and 4 mm right to left shift,Plan start hypertonic saline drip. Antony Contras, MD 12/13/16 1707 hrs To contact Stroke Continuity provider, please refer to [email or web address] After hours, contact General Neurology

## 2016-12-13 NOTE — Progress Notes (Signed)
Rt 9 french femoral sheath removed at 1350.  Exoseal and manual preussure used to achieve hemostasis at 1430.  Distal pulses intact. No acute complications.  Site reviewed with Leba Tibbitts R.N.  Pressure dressing applied to site.  Lt 4 french femoral sheath removed at 1412.  VPAD and manual pressure used to achieve hemostasis at 1425.  Distal pulses intact.  No acute complications. Site reviewed with Presenter, broadcasting.  Pressure dressing applied.  [facility]

## 2016-12-13 NOTE — Progress Notes (Signed)
eLink Physician-Brief Progress Note Patient Name: Julia Madden DOB: [birth year] MRN: [id number]   Date of Service  12/13/2016  HPI/Events of Note  Dense R MCA-CVA, LKW 730p, s/p IV tpa S/p thrombectomy hypertensive  eICU Interventions  Vent Sedation - propofol + int fent hydrallazine prn + cardene gtt     Intervention Category Evaluation Type: New Patient Evaluation  ALVA,RAKESH V. 12/13/2016, 12:43 AM

## 2016-12-13 NOTE — Progress Notes (Signed)
PT Cancellation Note  Patient Details Name: Julia Madden MRN: [id number] DOB: [birth year]   Cancelled Treatment:    Reason Eval/Treat Not Completed: Patient not medically ready   Noted now s/p Neuro-Interventional Radiology Cerebral Revascularization/Angiogram overnight; On strict bedrest and on vent for airway protection;   Will hold PT for now;  Will follow up later today as time allows;  Otherwise, will follow up for PT tomorrow;   Thank you,  Roney Marion, PT  [facility] Pager [phone] Office [phone]     [name] 12/13/2016, 7:44 AM

## 2016-12-13 NOTE — Consult Note (Signed)
PULMONARY / CRITICAL CARE MEDICINE   Name: Julia Madden MRN: [id number] DOB: 07-03-1936    ADMISSION DATE:  12/08/2016 CONSULTATION DATE:  12/13/2016  REFERRING MD :  Dr. Leonel Ramsay  CHIEF COMPLAINT:  Right CVA  HISTORY OF PRESENT ILLNESS:     81 y.o. female with a history of DM-2, CKD,  CABG x4 in 2010, Afib (on plavex only, no anticoag), previous stroke with mild deficits who presents with acute onset left-sided weakness.  She is currently intubated and unable to provide history.  She was last known normal at 7:30PM on 12/09/2016.   She was found to have Right MCA occlusion, received IV TPA and IR achieved Right MCA revascularization with mechanical thrombectomy.   PAST MEDICAL HISTORY :   has a past medical history of Arthritis; Atrial myxoma (2017); CAD (coronary artery disease); CKD (chronic kidney disease); CVA (cerebral vascular accident) ([place]); Depression; Diabetes mellitus without complication ([place]); Diabetic polyneuropathy ([place]); Hyperlipidemia; Hypertension; Incontinence; Long term current use of insulin ([place]); Long-term (current) use of anticoagulants; Mass of heart; Obstructive uropathy; Stroke [facility]); and Systolic murmur.  has a past surgical history that includes Excision myxoma (07/2016); Replacement total knee (Left); Coronary artery bypass graft (2010); Amputation toe (Left, 2015); URINARY STENT (2017); ir generic historical (12/07/2016); ir generic historical (12/15/2016); and ir generic historical (12/11/2016). Prior to Admission medications   Medication Sig Start Date End Date Taking? Authorizing Provider  acetaminophen (TYLENOL) 325 MG tablet Take 650 mg by mouth every 6 (six) hours as needed.    Historical Provider, MD  amiodarone (PACERONE) 200 MG tablet Take 1 tablet (200 mg total) by mouth daily. 12/04/16   Jettie Booze, MD  AMLODIPINE BESYLATE PO Take 2.5 mg by mouth.    Historical Provider, MD  aspirin EC 81 MG tablet Take 81 mg by mouth daily.     Historical Provider, MD  atorvastatin (LIPITOR) 20 MG tablet Take 1 tablet (20 mg total) by mouth daily. 12/04/16   Jettie Booze, MD  clopidogrel (PLAVIX) 75 MG tablet Take 75 mg by mouth daily.    Historical Provider, MD  DULoxetine (CYMBALTA) 60 MG capsule Take 60 mg by mouth daily.    Historical Provider, MD  insulin glargine (LANTUS) 100 UNIT/ML injection Inject 0.12 mLs (12 Units total) into the skin at bedtime. 12/04/16   Philemon Kingdom, MD  insulin lispro (HUMALOG) 100 UNIT/ML injection Inject 0.01-0.03 mLs (1-3 Units total) into the skin as needed for high blood sugar. 12/04/16   Jettie Booze, MD  Insulin Pen Needle (CAREFINE PEN NEEDLES) 32G X 4 MM MISC Use 4x a day 12/03/16   Philemon Kingdom, MD  insulin regular (NOVOLIN R,HUMULIN R) 250 units/2.6mL (100 units/mL) injection Inject into the skin 3 (three) times daily before meals.    Historical Provider, MD  metoprolol tartrate (LOPRESSOR) 25 MG tablet Take 1 tablet (25 mg total) by mouth 2 (two) times daily. 12/04/16   Jettie Booze, MD  Probiotic Product (PROBIOTIC DAILY PO) Take by mouth.    Historical Provider, MD   No Known Allergies  FAMILY HISTORY:  indicated that her mother is alive. She indicated that her father is alive. She indicated that both of her sisters are alive. She indicated that only one of her two brothers is alive. She indicated that her maternal grandmother is deceased. She indicated that her maternal grandfather is deceased. She indicated that her paternal grandmother is deceased. She indicated that her paternal grandfather is deceased. She indicated that all  of her four sons are alive.   SOCIAL HISTORY:  reports that she has never smoked. She has never used smokeless tobacco. She reports that she does not drink alcohol or use drugs.  REVIEW OF SYSTEMS:   Unable to obtain  SUBJECTIVE:   VITAL SIGNS: Temp:  [97.6 F (36.4 C)] 97.6 F (36.4 C) (01/21 0055) Pulse Rate:  [78] 78 (01/20  2038) Resp:  [18-20] 18 (01/20 2100) BP: (153-172)/(70-108) 153/108 (01/20 2100) SpO2:  [98 %] 98 % (01/20 2112) FiO2 (%):  [100 %] 100 % (01/21 0030) Weight:  [67.2 kg (148 lb 2.4 oz)] 67.2 kg (148 lb 2.4 oz) (01/20 2029) HEMODYNAMICS:   VENTILATOR SETTINGS: Vent Mode: PRVC FiO2 (%):  [100 %] 100 % Set Rate:  [15 bmp] 15 bmp Vt Set:  [500 mL] 500 mL PEEP:  [5 cmH20] 5 cmH20 Plateau Pressure:  [17 cmH20] 17 cmH20 INTAKE / OUTPUT:  Intake/Output Summary (Last 24 hours) at 12/13/16 0107 Last data filed at 12/13/16 0025  Gross per 24 hour  Intake             1100 ml  Output             1000 ml  Net              100 ml    PHYSICAL EXAMINATION: General:  Sedated Neuro:  Sedated on propofol HEENT:  Intubated, NCAT Cardiovascular:  RRR, now Holosystolic blowing murmur LSB at tricuspid, increased with inspiration. Bilateral palpable radial pulses Lungs:  Mechanical breath sounds b/l, no w/r/r Abdomen:  Soft non tender Musculoskeletal:  Left hallux amputation Skin:  Thin, sternotomy scar, multiple vein graft scars Lines/Tubes:  ETT, Bilateral femoral A-lines, no hematomas   LABS:  CBC  Recent Labs Lab 12/23/2016 2023 11/25/2016 2100  WBC  --  8.4  HGB 10.5* 9.1*  HCT 31.0* 29.0*  PLT  --  433*   Coag's  Recent Labs Lab 11/27/2016 2100  APTT 35  INR 1.04   BMET  Recent Labs Lab 12/15/2016 2023 12/14/2016 2100  NA 130* 133*  K 6.3* 5.0  CL 97* 99*  CO2  --  22  BUN 41* 28*  CREATININE 1.80* 1.83*  GLUCOSE 413* 399*   Electrolytes  Recent Labs Lab 12/19/2016 2100  CALCIUM 8.5*   Sepsis Markers No results for input(s): LATICACIDVEN, PROCALCITON, O2SATVEN in the last 168 hours. ABG No results for input(s): PHART, PCO2ART, PO2ART in the last 168 hours. Liver Enzymes  Recent Labs Lab 12/21/2016 2100  AST 19  ALT 13*  ALKPHOS 85  BILITOT 0.3  ALBUMIN 2.6*   Cardiac Enzymes No results for input(s): TROPONINI, PROBNP in the last 168  hours. Glucose  Recent Labs Lab 12/02/2016 2026  GLUCAP 381*    Imaging Ct Angio Head W Or Wo Contrast  Result Date: 12/21/2016 CLINICAL DATA:  Left-sided weakness. Dens right MCA on noncontrast CT. EXAM: CT ANGIOGRAPHY HEAD AND NECK TECHNIQUE: Multidetector CT imaging of the head and neck was performed using the standard protocol during bolus administration of intravenous contrast. Multiplanar CT image reconstructions and MIPs were obtained to evaluate the vascular anatomy. Carotid stenosis measurements (when applicable) are obtained utilizing NASCET criteria, using the distal internal carotid diameter as the denominator. CONTRAST:  50 mL Isovue 370 COMPARISON:  None. FINDINGS: CTA NECK FINDINGS Aortic arch: Normal variant 4 vessel aortic arch with the left vertebral artery arising directly from the arch between the left common carotid and left subclavian arteries.  Mild calcified plaque in the proximal left subclavian artery without stenosis. Widely patent brachiocephalic and right subclavian arteries. Right carotid system: Patent with moderate calcified plaque about the bifurcation but without significant stenosis. Left carotid system: Patent with mild calcified plaque at the bifurcation. No stenosis. Vertebral arteries: Patent and codominant without evidence of dissection or significant stenosis. Mild focal calcified plaque in the proximal left V1 segment. Skeleton: Advanced disc degeneration at C5-6 with mild disc degeneration elsewhere in the cervical spine. Multilevel facet arthrosis, severe on the right at C3-4. Grade 1 anterolisthesis of C7 on T1, facet mediated. Other neck: Bilateral thyroid nodules, some of which are calcified. Largest nodule measures 1.3 cm on the right, below the threshold for recommendation of routine ultrasound follow-up. Upper chest: Mild mosaic attenuation in the right upper lobe, possibly air trapping. Review of the MIP images confirms the above findings CTA HEAD FINDINGS  Anterior circulation: The internal carotid arteries are patent from skullbase to carotid termini with diffuse siphon atherosclerosis. There is mild bilateral supraclinoid stenosis. The right MCA is occluded at the mid to distal M1 level without significant branch vessel reconstitution. ACAs and left MCA are patent without evidence of major branch occlusion or significant proximal stenosis. The left A1 segment is severely hypoplastic, with the left A2 predominantly supplied from the right via the anterior communicating artery. No aneurysm. Posterior circulation: The intracranial vertebral arteries are patent with mild-to-moderate calcified plaque but no significant stenosis. PICA and SCA origins are patent. Basilar artery is widely patent. Posterior communicating arteries are diminutive or absent. PCAs are patent with mild atherosclerotic irregularity bilaterally but no significant proximal stenosis. No aneurysm. Venous sinuses: Not adequately assessed due to arterial phase contrast timing. Anatomic variants: Hypoplastic left A1. Review of the MIP images confirms the above findings IMPRESSION: 1. Right M1 occlusion. 2. Intracranial atherosclerosis with mild bilateral ICA stenosis. 3. Atherosclerosis about the right greater than left carotid bifurcations in the neck without significant stenosis. These results were called by telephone at the time of interpretation on 11/28/2016 at 9:15 pm to Dr. Roland Rack , who verbally acknowledged these results. Electronically Signed   By: Logan Bores M.D.   On: 12/10/2016 21:27   Ct Angio Neck W Or Wo Contrast  Result Date: 12/07/2016 CLINICAL DATA:  Left-sided weakness. Dens right MCA on noncontrast CT. EXAM: CT ANGIOGRAPHY HEAD AND NECK TECHNIQUE: Multidetector CT imaging of the head and neck was performed using the standard protocol during bolus administration of intravenous contrast. Multiplanar CT image reconstructions and MIPs were obtained to evaluate the  vascular anatomy. Carotid stenosis measurements (when applicable) are obtained utilizing NASCET criteria, using the distal internal carotid diameter as the denominator. CONTRAST:  50 mL Isovue 370 COMPARISON:  None. FINDINGS: CTA NECK FINDINGS Aortic arch: Normal variant 4 vessel aortic arch with the left vertebral artery arising directly from the arch between the left common carotid and left subclavian arteries. Mild calcified plaque in the proximal left subclavian artery without stenosis. Widely patent brachiocephalic and right subclavian arteries. Right carotid system: Patent with moderate calcified plaque about the bifurcation but without significant stenosis. Left carotid system: Patent with mild calcified plaque at the bifurcation. No stenosis. Vertebral arteries: Patent and codominant without evidence of dissection or significant stenosis. Mild focal calcified plaque in the proximal left V1 segment. Skeleton: Advanced disc degeneration at C5-6 with mild disc degeneration elsewhere in the cervical spine. Multilevel facet arthrosis, severe on the right at C3-4. Grade 1 anterolisthesis of C7 on T1, facet  mediated. Other neck: Bilateral thyroid nodules, some of which are calcified. Largest nodule measures 1.3 cm on the right, below the threshold for recommendation of routine ultrasound follow-up. Upper chest: Mild mosaic attenuation in the right upper lobe, possibly air trapping. Review of the MIP images confirms the above findings CTA HEAD FINDINGS Anterior circulation: The internal carotid arteries are patent from skullbase to carotid termini with diffuse siphon atherosclerosis. There is mild bilateral supraclinoid stenosis. The right MCA is occluded at the mid to distal M1 level without significant branch vessel reconstitution. ACAs and left MCA are patent without evidence of major branch occlusion or significant proximal stenosis. The left A1 segment is severely hypoplastic, with the left A2 predominantly  supplied from the right via the anterior communicating artery. No aneurysm. Posterior circulation: The intracranial vertebral arteries are patent with mild-to-moderate calcified plaque but no significant stenosis. PICA and SCA origins are patent. Basilar artery is widely patent. Posterior communicating arteries are diminutive or absent. PCAs are patent with mild atherosclerotic irregularity bilaterally but no significant proximal stenosis. No aneurysm. Venous sinuses: Not adequately assessed due to arterial phase contrast timing. Anatomic variants: Hypoplastic left A1. Review of the MIP images confirms the above findings IMPRESSION: 1. Right M1 occlusion. 2. Intracranial atherosclerosis with mild bilateral ICA stenosis. 3. Atherosclerosis about the right greater than left carotid bifurcations in the neck without significant stenosis. These results were called by telephone at the time of interpretation on 11/28/2016 at 9:15 pm to Dr. Roland Rack , who verbally acknowledged these results. Electronically Signed   By: Logan Bores M.D.   On: 12/23/2016 21:27   Ir US Guide Vasc Access Right  Result Date: 12/13/2016 INDICATION: 81 year old female with emergent large vessel occlusion, involving right MCA territory. Last known well 7:30 p.m. December 12, 2016. Baseline MRS of 2 with good function. Candidate for tPA and she received tPA. CT aspects score of 10 EXAM: IR PERCUTANEOUS ART THORMBECTOMY/INFUSION INTRACRANIAL INCLUDE DIAG ANGIO; IR ANGIO INTRA EXTRACRAN SEL COM CAROTID INNOMINATE UNI LEFT MOD SED; IR ULTRASOUND GUIDANCE VASC ACCESS RIGHT COMPARISON:  CT 12/01/2016 MEDICATIONS: 2.0 g Ancef. The antibiotic was administered within 1 hour of the procedure ANESTHESIA/SEDATION: General endotracheal with anesthesia team CONTRAST:  200 cc contrast FLUOROSCOPY TIME:  Fluoroscopy Time: 43 minutes 18 seconds (1,247 mGy). COMPLICATIONS: None TECHNIQUE: Informed written consent was obtained from the patient's family  after a thorough discussion of the procedural risks, benefits and alternatives. Specific risks discussed include: Bleeding, infection, contrast reaction, kidney injury/failure, need for further procedure/surgery, arterial injury or dissection, embolization to new territory, intracranial hemorrhage (10-15% risk), neurologic deterioration, cardiopulmonary collapse, death. All questions were addressed. Maximal Sterile Barrier Technique was utilized including during the procedure including caps, mask, sterile gowns, sterile gloves, sterile drape, hand hygiene and skin antiseptic. A timeout was performed prior to the initiation of the procedure. Ultrasound survey of the right inguinal region was performed with images stored and sent to PACs. 11 blade scalpel was used to make a small incision. A micropuncture needle was used access the right common femoral artery under ultrasound. With excellent arterial blood flow returned, an .018 micro wire was passed through the needle, observed to enter the abdominal aorta under fluoroscopy. The needle was removed, and a micropuncture sheath was placed over the wire. The inner dilator and wire were removed, and an 035 Bentson wire was advanced under fluoroscopy into the abdominal aorta. The sheath was removed and a standard 5 [place] vascular sheath was placed. The dilator was removed  and the sheath was flushed. Ultrasound survey of the left inguinal region was performed with images stored and sent to PACs. A micropuncture needle was used access the left common femoral artery under ultrasound. With excellent arterial blood flow returned, and an .018 micro wire was passed through the needle, observed enter the abdominal aorta under fluoroscopy. The needle was removed, and a micropuncture sheath was placed over the wire. The inner dilator and wire were removed, and an 035 Bentson wire was advanced under fluoroscopy into the abdominal aorta. The sheath was removed and a standard 4 [place]  vascular sheath was placed. The dilator was removed and the sheath was flushed. This sheath was used for arterial line. A 5F JB-1 diagnostic catheter was advanced over the wire to the proximal descending thoracic aorta. Wire was then removed. Double flush of the catheter was performed. Catheter was then used to select the innominate artery. Angiogram was performed. Using roadmap technique, the catheter was advanced over a standard Glidewire into right common carotid artery. Formal angiogram was performed. Exchange length Rosen wire was then passed through the diagnostic catheter to the distal common carotid artery and the diagnostic catheter was removed. The 5 French sheath was removed and exchanged for 8 French 55 centimeter BrightTip sheath. Sheath was flushed and attached to pressurized and heparinized saline bag for constant forward flow. Then an 8 [place], 95 cm Flowgate balloon tip catheter was prepared on the back table with inflation of the balloon with 50/50 concentration of dilute contrast. The balloon catheter was then advanced over the wire, positioned into the distal common carotid artery. Copious back flush was performed and the balloon catheter was attached to heparinized and pressurized saline bag for forward flow. Flowgate balloon catheter was then advanced over the Rosen wire into the distal internal carotid artery. Angiogram was performed for road map guide. The number aspiration system was then introduced through the balloon guide catheter, using a synchro soft 014 wire and a coaxial system of a 3 max catheter through Ace 68 catheter. Coaxial system was advanced into the intracranial segment of the right ICA. Given the extreme tortuosity, a coaxial system would not reached the face of the clot. An exchange was required using Rosen wire through the 3 max catheter. The balloon catheter, a 68, and the 3 max catheter were removed. A neuron max 80cm catheter was then advanced over the Rosen wire  through the 8 French sheath, positioned in the distal common carotid artery. The coaxial 3 max and Ace 68 were then advanced over the synchro soft wire to the intracranial segment. Moderate atherosclerotic changes of the cavernous segment of the right ICA precluded advancement of the a 68 catheter over the 3 max catheter to the face of the thrombus. Only the 3 max catheter could be advanced to the face of the thrombus. Direct aspiration (ADAPT technique) was then applied with the 3 max catheter. After aspiration, a control angiogram was performed through the Ace 68. A second pass with the 3 max catheter was performed. Control angiogram was performed. Three max catheter was then removed from the Ace 68, and Trevo Provue was advanced through the a 68 to the intracranial segment with the synchro soft micro wire. Microcatheter system was advanced into the internal carotid artery, to the level of the occlusion. The micro wire was then carefully advanced through the occluded segment. Microcatheter was then push through the occluded segment and the wire was removed. Blood was then aspirated through the hub of  the microcatheter, and a gentle contrast injection was performed confirming intraluminal position. A rotating hemostatic valve was then attached to the back end of the microcatheter, and a pressurized and heparinized saline bag was attached to the catheter. 4 x 40 solitaire device was then selected. Back flush was achieved at the rotating hemostatic valve, and then the device was gently advanced through the microcatheter to the distal end. The retriever was then unsheathed by withdrawing the microcatheter under fluoroscopy. Once the retriever was completely unsheathed, control angiogram was performed from the Ace 68 SOLUMBRA technique was then used with constant aspiration performed at the tip of the Ace 68 catheter as the retriever was gently and slowly withdrawn with fluoroscopic observation. Once the retriever was  entirely removed from the system, free aspiration was confirmed at the hub of the intermediate catheter, with free blood return confirmed. The rotating hemostatic valve was reattached, and a control angiogram was performed. The neuro on max catheter and the Ace 68 catheter were removed. JB 1 catheter was used to select the left common carotid artery. Angiogram was performed of the left common carotid artery. Control angiogram was performed at the right common femoral artery puncture site. The 55 centimeter 8 French sheath was exchanged for a standard 9 French sheath at the common femoral artery puncture site. Patient tolerated the procedure well and remained hemodynamically stable throughout. No complications were encountered and no significant blood loss encountered. FINDINGS: Type 3 arch with significant tortuosity of the branch vessels. Right common carotid artery: Tortuous common carotid artery. Moderate calcified atherosclerotic plaque at the bifurcation. Right external carotid artery: Patent with antegrade flow. Right internal carotid artery: Significant tortuosity of the right cervical internal carotid artery. The cervical segment is patent. Horizontal and vertical petrous segment patent. Patent cavernous segment with moderate atherosclerotic changes. Ophthalmic artery originates from typical location of the ICA. Occlusion of the proximal M1 segment of the right MCA. Patent anterior communicating artery with perfusion of the left ACA territory via the right ICA. Right ACA patent. Right MCA: Before aspiration and mechanical thrombectomy, there is no flow through the M1 segment. After the first pass with direct aspiration, there was TICI 2a flow restored AFter SOLUMBRA technique, TICI 3 flow was restored. Left common carotid artery with mild atherosclerotic changes. Significant tortuosity of the left common carotid artery. No significant stenosis at the origin of the cervical segment of the left ICA. The left  intracranial segment is widely patent. With perfusion of the left frontal and parietal region. No significant for perfusion of the left temporal segment. Adequate puncture site at the right common femoral artery with no restriction of flow with the 8 French sheath. IMPRESSION: Status post right cerebral angiogram and mechanical thrombectomy of acute right MCA occlusion, with complete restoration of flow, TICI-3. Signed, Dulcy Fanny. Earleen Newport, DO [facility] Electronically Signed   By: Corrie Mckusick D.O.   On: 12/13/2016 00:49   Ir Percutaneous Art Thrombectomy/infusion Intracranial Inc Diag Angio  Result Date: 12/13/2016 INDICATION: 81 year old female with emergent large vessel occlusion, involving right MCA territory. Last known well 7:30 p.m. December 12, 2016. Baseline MRS of 2 with good function. Candidate for tPA and she received tPA. CT aspects score of 10 EXAM: IR PERCUTANEOUS ART THORMBECTOMY/INFUSION INTRACRANIAL INCLUDE DIAG ANGIO; IR ANGIO INTRA EXTRACRAN SEL COM CAROTID INNOMINATE UNI LEFT MOD SED; IR ULTRASOUND GUIDANCE VASC ACCESS RIGHT COMPARISON:  CT 11/26/2016 MEDICATIONS: 2.0 g Ancef. The antibiotic was administered within 1 hour  of the procedure ANESTHESIA/SEDATION: General endotracheal with anesthesia team CONTRAST:  200 cc contrast FLUOROSCOPY TIME:  Fluoroscopy Time: 43 minutes 18 seconds (1,247 mGy). COMPLICATIONS: None TECHNIQUE: Informed written consent was obtained from the patient's family after a thorough discussion of the procedural risks, benefits and alternatives. Specific risks discussed include: Bleeding, infection, contrast reaction, kidney injury/failure, need for further procedure/surgery, arterial injury or dissection, embolization to new territory, intracranial hemorrhage (10-15% risk), neurologic deterioration, cardiopulmonary collapse, death. All questions were addressed. Maximal Sterile Barrier Technique was utilized  including during the procedure including caps, mask, sterile gowns, sterile gloves, sterile drape, hand hygiene and skin antiseptic. A timeout was performed prior to the initiation of the procedure. Ultrasound survey of the right inguinal region was performed with images stored and sent to PACs. 11 blade scalpel was used to make a small incision. A micropuncture needle was used access the right common femoral artery under ultrasound. With excellent arterial blood flow returned, an .018 micro wire was passed through the needle, observed to enter the abdominal aorta under fluoroscopy. The needle was removed, and a micropuncture sheath was placed over the wire. The inner dilator and wire were removed, and an 035 Bentson wire was advanced under fluoroscopy into the abdominal aorta. The sheath was removed and a standard 5 [place] vascular sheath was placed. The dilator was removed and the sheath was flushed. Ultrasound survey of the left inguinal region was performed with images stored and sent to PACs. A micropuncture needle was used access the left common femoral artery under ultrasound. With excellent arterial blood flow returned, and an .018 micro wire was passed through the needle, observed enter the abdominal aorta under fluoroscopy. The needle was removed, and a micropuncture sheath was placed over the wire. The inner dilator and wire were removed, and an 035 Bentson wire was advanced under fluoroscopy into the abdominal aorta. The sheath was removed and a standard 4 [place] vascular sheath was placed. The dilator was removed and the sheath was flushed. This sheath was used for arterial line. A 60F JB-1 diagnostic catheter was advanced over the wire to the proximal descending thoracic aorta. Wire was then removed. Double flush of the catheter was performed. Catheter was then used to select the innominate artery. Angiogram was performed. Using roadmap technique, the catheter was advanced over a standard Glidewire into  right common carotid artery. Formal angiogram was performed. Exchange length Rosen wire was then passed through the diagnostic catheter to the distal common carotid artery and the diagnostic catheter was removed. The 5 French sheath was removed and exchanged for 8 French 55 centimeter BrightTip sheath. Sheath was flushed and attached to pressurized and heparinized saline bag for constant forward flow. Then an 8 [place], 95 cm Flowgate balloon tip catheter was prepared on the back table with inflation of the balloon with 50/50 concentration of dilute contrast. The balloon catheter was then advanced over the wire, positioned into the distal common carotid artery. Copious back flush was performed and the balloon catheter was attached to heparinized and pressurized saline bag for forward flow. Flowgate balloon catheter was then advanced over the Rosen wire into the distal internal carotid artery. Angiogram was performed for road map guide. The number aspiration system was then introduced through the balloon guide catheter, using a synchro soft 014 wire and a coaxial system of a 3 max catheter through Ace 68 catheter. Coaxial system was advanced into the intracranial segment of the right ICA. Given the extreme tortuosity, a coaxial system would  not reached the face of the clot. An exchange was required using Rosen wire through the 3 max catheter. The balloon catheter, a 68, and the 3 max catheter were removed. A neuron max 80cm catheter was then advanced over the Rosen wire through the 8 French sheath, positioned in the distal common carotid artery. The coaxial 3 max and Ace 68 were then advanced over the synchro soft wire to the intracranial segment. Moderate atherosclerotic changes of the cavernous segment of the right ICA precluded advancement of the a 68 catheter over the 3 max catheter to the face of the thrombus. Only the 3 max catheter could be advanced to the face of the thrombus. Direct aspiration (ADAPT  technique) was then applied with the 3 max catheter. After aspiration, a control angiogram was performed through the Ace 68. A second pass with the 3 max catheter was performed. Control angiogram was performed. Three max catheter was then removed from the Ace 68, and Trevo Provue was advanced through the a 68 to the intracranial segment with the synchro soft micro wire. Microcatheter system was advanced into the internal carotid artery, to the level of the occlusion. The micro wire was then carefully advanced through the occluded segment. Microcatheter was then push through the occluded segment and the wire was removed. Blood was then aspirated through the hub of the microcatheter, and a gentle contrast injection was performed confirming intraluminal position. A rotating hemostatic valve was then attached to the back end of the microcatheter, and a pressurized and heparinized saline bag was attached to the catheter. 4 x 40 solitaire device was then selected. Back flush was achieved at the rotating hemostatic valve, and then the device was gently advanced through the microcatheter to the distal end. The retriever was then unsheathed by withdrawing the microcatheter under fluoroscopy. Once the retriever was completely unsheathed, control angiogram was performed from the Ace 68 SOLUMBRA technique was then used with constant aspiration performed at the tip of the Ace 68 catheter as the retriever was gently and slowly withdrawn with fluoroscopic observation. Once the retriever was entirely removed from the system, free aspiration was confirmed at the hub of the intermediate catheter, with free blood return confirmed. The rotating hemostatic valve was reattached, and a control angiogram was performed. The neuro on max catheter and the Ace 68 catheter were removed. JB 1 catheter was used to select the left common carotid artery. Angiogram was performed of the left common carotid artery. Control angiogram was performed at  the right common femoral artery puncture site. The 55 centimeter 8 French sheath was exchanged for a standard 9 French sheath at the common femoral artery puncture site. Patient tolerated the procedure well and remained hemodynamically stable throughout. No complications were encountered and no significant blood loss encountered. FINDINGS: Type 3 arch with significant tortuosity of the branch vessels. Right common carotid artery: Tortuous common carotid artery. Moderate calcified atherosclerotic plaque at the bifurcation. Right external carotid artery: Patent with antegrade flow. Right internal carotid artery: Significant tortuosity of the right cervical internal carotid artery. The cervical segment is patent. Horizontal and vertical petrous segment patent. Patent cavernous segment with moderate atherosclerotic changes. Ophthalmic artery originates from typical location of the ICA. Occlusion of the proximal M1 segment of the right MCA. Patent anterior communicating artery with perfusion of the left ACA territory via the right ICA. Right ACA patent. Right MCA: Before aspiration and mechanical thrombectomy, there is no flow through the M1 segment. After the first pass with direct  aspiration, there was TICI 2a flow restored AFter SOLUMBRA technique, TICI 3 flow was restored. Left common carotid artery with mild atherosclerotic changes. Significant tortuosity of the left common carotid artery. No significant stenosis at the origin of the cervical segment of the left ICA. The left intracranial segment is widely patent. With perfusion of the left frontal and parietal region. No significant for perfusion of the left temporal segment. Adequate puncture site at the right common femoral artery with no restriction of flow with the 8 French sheath. IMPRESSION: Status post right cerebral angiogram and mechanical thrombectomy of acute right MCA occlusion, with complete restoration of flow, TICI-3. Signed, Dulcy Fanny. Earleen Newport, DO  [facility] Radiology Electronically Signed   By: Corrie Mckusick D.O.   On: 12/13/2016 00:49   Ct Head Code Stroke W/o Cm  Addendum Date: 12/04/2016   ADDENDUM REPORT: 12/08/2016 20:57 ADDENDUM: Chronic appearing left cerebellar and right occipital lobe infarcts. Electronically Signed   By: Logan Bores M.D.   On: 12/04/2016 20:57   Result Date: 12/10/2016 CLINICAL DATA:  Code stroke.  Left-sided weakness. EXAM: CT HEAD WITHOUT CONTRAST TECHNIQUE: Contiguous axial images were obtained from the base of the skull through the vertex without intravenous contrast. COMPARISON:  None. FINDINGS: Brain: A right occipital lobe infarct is present and is favored to be chronic. There is a small chronic appearing left cerebellar infarct. Elsewhere, there is no evidence of acute cortical infarct, intracranial hemorrhage, mass, midline shift, or extra-axial fluid collection. There is moderate cerebral atrophy. Cerebral white matter hypodensities are nonspecific but compatible with moderate chronic small vessel ischemic disease. A mega cisterna magna is incidentally noted. Vascular: Calcified atherosclerosis at the skullbase. Dense right MCA. Skull: No fracture or focal osseous lesion. Sinuses/Orbits: The visualized paranasal sinuses and mastoid air cells are clear. Prior bilateral cataract extraction is noted. Other: None. ASPECTS [facility] Stroke Program Early CT Score) - Ganglionic level infarction (caudate, lentiform nuclei, internal capsule, insula, M1-M3 cortex): 7 - Supraganglionic infarction (M4-M6 cortex): 3 Total score (0-10 with 10 being normal): 10 IMPRESSION: 1. No acute intracranial hemorrhage or definite acute infarct identified. 2. ASPECTS is 10. 3. Dense right MCA concerning for acute thrombus. 4. Moderate chronic small vessel ischemic disease and cerebral atrophy. These results were called by telephone at the time of interpretation on 12/01/2016 at 8:28 pm to Dr.  Leonel Ramsay, who verbally acknowledged these results. Electronically Signed: By: Logan Bores M.D. On: 12/14/2016 20:33   Ir Angio Intra Extracran Sel Com Carotid Innominate Uni L Mod Sed  Result Date: 12/13/2016 INDICATION: 81 year old female with emergent large vessel occlusion, involving right MCA territory. Last known well 7:30 p.m. December 12, 2016. Baseline MRS of 2 with good function. Candidate for tPA and she received tPA. CT aspects score of 10 EXAM: IR PERCUTANEOUS ART THORMBECTOMY/INFUSION INTRACRANIAL INCLUDE DIAG ANGIO; IR ANGIO INTRA EXTRACRAN SEL COM CAROTID INNOMINATE UNI LEFT MOD SED; IR ULTRASOUND GUIDANCE VASC ACCESS RIGHT COMPARISON:  CT 12/17/2016 MEDICATIONS: 2.0 g Ancef. The antibiotic was administered within 1 hour of the procedure ANESTHESIA/SEDATION: General endotracheal with anesthesia team CONTRAST:  200 cc contrast FLUOROSCOPY TIME:  Fluoroscopy Time: 43 minutes 18 seconds (1,247 mGy). COMPLICATIONS: None TECHNIQUE: Informed written consent was obtained from the patient's family after a thorough discussion of the procedural risks, benefits and alternatives. Specific risks discussed include: Bleeding, infection, contrast reaction, kidney injury/failure, need for further procedure/surgery, arterial injury or dissection, embolization to new territory, intracranial hemorrhage (10-15% risk), neurologic deterioration, cardiopulmonary collapse,  death. All questions were addressed. Maximal Sterile Barrier Technique was utilized including during the procedure including caps, mask, sterile gowns, sterile gloves, sterile drape, hand hygiene and skin antiseptic. A timeout was performed prior to the initiation of the procedure. Ultrasound survey of the right inguinal region was performed with images stored and sent to PACs. 11 blade scalpel was used to make a small incision. A micropuncture needle was used access the right common femoral artery under ultrasound. With excellent arterial blood flow  returned, an .018 micro wire was passed through the needle, observed to enter the abdominal aorta under fluoroscopy. The needle was removed, and a micropuncture sheath was placed over the wire. The inner dilator and wire were removed, and an 035 Bentson wire was advanced under fluoroscopy into the abdominal aorta. The sheath was removed and a standard 5 [place] vascular sheath was placed. The dilator was removed and the sheath was flushed. Ultrasound survey of the left inguinal region was performed with images stored and sent to PACs. A micropuncture needle was used access the left common femoral artery under ultrasound. With excellent arterial blood flow returned, and an .018 micro wire was passed through the needle, observed enter the abdominal aorta under fluoroscopy. The needle was removed, and a micropuncture sheath was placed over the wire. The inner dilator and wire were removed, and an 035 Bentson wire was advanced under fluoroscopy into the abdominal aorta. The sheath was removed and a standard 4 [place] vascular sheath was placed. The dilator was removed and the sheath was flushed. This sheath was used for arterial line. A 47F JB-1 diagnostic catheter was advanced over the wire to the proximal descending thoracic aorta. Wire was then removed. Double flush of the catheter was performed. Catheter was then used to select the innominate artery. Angiogram was performed. Using roadmap technique, the catheter was advanced over a standard Glidewire into right common carotid artery. Formal angiogram was performed. Exchange length Rosen wire was then passed through the diagnostic catheter to the distal common carotid artery and the diagnostic catheter was removed. The 5 French sheath was removed and exchanged for 8 French 55 centimeter BrightTip sheath. Sheath was flushed and attached to pressurized and heparinized saline bag for constant forward flow. Then an 8 [place], 95 cm Flowgate balloon tip catheter was prepared  on the back table with inflation of the balloon with 50/50 concentration of dilute contrast. The balloon catheter was then advanced over the wire, positioned into the distal common carotid artery. Copious back flush was performed and the balloon catheter was attached to heparinized and pressurized saline bag for forward flow. Flowgate balloon catheter was then advanced over the Rosen wire into the distal internal carotid artery. Angiogram was performed for road map guide. The number aspiration system was then introduced through the balloon guide catheter, using a synchro soft 014 wire and a coaxial system of a 3 max catheter through Ace 68 catheter. Coaxial system was advanced into the intracranial segment of the right ICA. Given the extreme tortuosity, a coaxial system would not reached the face of the clot. An exchange was required using Rosen wire through the 3 max catheter. The balloon catheter, a 68, and the 3 max catheter were removed. A neuron max 80cm catheter was then advanced over the Rosen wire through the 8 French sheath, positioned in the distal common carotid artery. The coaxial 3 max and Ace 68 were then advanced over the synchro soft wire to the intracranial segment. Moderate atherosclerotic changes of  the cavernous segment of the right ICA precluded advancement of the a 68 catheter over the 3 max catheter to the face of the thrombus. Only the 3 max catheter could be advanced to the face of the thrombus. Direct aspiration (ADAPT technique) was then applied with the 3 max catheter. After aspiration, a control angiogram was performed through the Ace 68. A second pass with the 3 max catheter was performed. Control angiogram was performed. Three max catheter was then removed from the Ace 68, and Trevo Provue was advanced through the a 68 to the intracranial segment with the synchro soft micro wire. Microcatheter system was advanced into the internal carotid artery, to the level of the occlusion. The  micro wire was then carefully advanced through the occluded segment. Microcatheter was then push through the occluded segment and the wire was removed. Blood was then aspirated through the hub of the microcatheter, and a gentle contrast injection was performed confirming intraluminal position. A rotating hemostatic valve was then attached to the back end of the microcatheter, and a pressurized and heparinized saline bag was attached to the catheter. 4 x 40 solitaire device was then selected. Back flush was achieved at the rotating hemostatic valve, and then the device was gently advanced through the microcatheter to the distal end. The retriever was then unsheathed by withdrawing the microcatheter under fluoroscopy. Once the retriever was completely unsheathed, control angiogram was performed from the Ace 68 SOLUMBRA technique was then used with constant aspiration performed at the tip of the Ace 68 catheter as the retriever was gently and slowly withdrawn with fluoroscopic observation. Once the retriever was entirely removed from the system, free aspiration was confirmed at the hub of the intermediate catheter, with free blood return confirmed. The rotating hemostatic valve was reattached, and a control angiogram was performed. The neuro on max catheter and the Ace 68 catheter were removed. JB 1 catheter was used to select the left common carotid artery. Angiogram was performed of the left common carotid artery. Control angiogram was performed at the right common femoral artery puncture site. The 55 centimeter 8 French sheath was exchanged for a standard 9 French sheath at the common femoral artery puncture site. Patient tolerated the procedure well and remained hemodynamically stable throughout. No complications were encountered and no significant blood loss encountered. FINDINGS: Type 3 arch with significant tortuosity of the branch vessels. Right common carotid artery: Tortuous common carotid artery. Moderate  calcified atherosclerotic plaque at the bifurcation. Right external carotid artery: Patent with antegrade flow. Right internal carotid artery: Significant tortuosity of the right cervical internal carotid artery. The cervical segment is patent. Horizontal and vertical petrous segment patent. Patent cavernous segment with moderate atherosclerotic changes. Ophthalmic artery originates from typical location of the ICA. Occlusion of the proximal M1 segment of the right MCA. Patent anterior communicating artery with perfusion of the left ACA territory via the right ICA. Right ACA patent. Right MCA: Before aspiration and mechanical thrombectomy, there is no flow through the M1 segment. After the first pass with direct aspiration, there was TICI 2a flow restored AFter SOLUMBRA technique, TICI 3 flow was restored. Left common carotid artery with mild atherosclerotic changes. Significant tortuosity of the left common carotid artery. No significant stenosis at the origin of the cervical segment of the left ICA. The left intracranial segment is widely patent. With perfusion of the left frontal and parietal region. No significant for perfusion of the left temporal segment. Adequate puncture site at the right common femoral  artery with no restriction of flow with the 8 French sheath. IMPRESSION: Status post right cerebral angiogram and mechanical thrombectomy of acute right MCA occlusion, with complete restoration of flow, TICI-3. Signed, Dulcy Fanny. Earleen Newport, DO [facility] Radiology Electronically Signed   By: Corrie Mckusick D.O.   On: 12/13/2016 00:49     ASSESSMENT / PLAN: 81yo CF with dense R MCA ischemic type CVA s/p IV TPA and mechanical thrombectomy.   PULMONARY A: Left intubated for airway protection P:   - ETT is in appropriate location - Ventilator management bundle - check ABG - SBT in AM for possible extubation  CARDIOVASCULAR A:  CAD Afib  P:  - NSR  now - continue home amiodarone - start in AM - Hold plavix - Increase Atorvastatin to 40mg  PO start tomorrow - Nicardipine gtt - goal SBP 120-140 MAP >70 - resume home metoprolol tomorrow  RENAL A:   CKD - unclear basline P:   - Maintain hydration after contrasted procedure in the setting of CKD - holding diuresis  GASTROINTESTINAL A:   Not active P:   - PPI ppx  HEMATOLOGIC A:   Anemia - likely chronic disease - unknown baseline P:  - monitor CBC - Iron panel sent  INFECTIOUS A:   Not active P:    ENDOCRINE A:   DM-2 Hyperglycemia    P:   - Basal bolus insulin - Per endocrine:  - lantus 12U QHS - SSI - Maintain BG <180  NEUROLOGIC A:   Sedated Right sided MCA - s/p thrombectomy and systemic TPA (completed at 2236) P:   RASS goal: 0 - d/c propfol - Start fentanyl gtt - intermittent versed - Hold IV needle sticks until 0230  Total critical care time: 30 min  Critical care time was exclusive of separately billable procedures and treating other patients.  Critical care was necessary to treat or prevent imminent or life-threatening deterioration.  Critical care was time spent personally by me on the following activities: development of treatment plan with patient and/or surrogate as well as nursing, discussions with consultants, evaluation of patient's response to treatment, examination of patient, obtaining history from patient or surrogate, ordering and performing treatments and interventions, ordering and review of laboratory studies, ordering and review of radiographic studies, pulse oximetry and re-evaluation of patient's condition.   Meribeth Mattes, DO., MS [facility]    Pulmonary and [place] Pager: [phone]  12/13/2016, 1:07 AM

## 2016-12-13 NOTE — Progress Notes (Signed)
PULMONARY / CRITICAL CARE MEDICINE   Name: Julia Madden MRN: [id number] DOB: 1936/11/03    ADMISSION DATE:  11/30/2016 CONSULTATION DATE:  12/13/2016  REFERRING MD :  Dr. Leonel Ramsay  CHIEF COMPLAINT:  Right CVA  HISTORY OF PRESENT ILLNESS:    81 y.o. female with a history of DM-2, CKD,  CABG x4 in 2010, Afib (on plavex only, no anticoag), previous stroke with mild deficits who presents with acute onset left-sided weakness.  She is currently intubated and unable to provide history.  She was last known normal at 7:30PM on 12/14/2016.   She was found to have Right MCA occlusion, received IV TPA and IR achieved Right MCA revascularization with mechanical thrombectomy.   SUBJECTIVE:  Waking up this am, following some commands. Tol PS 8/5.  Blood sugars very difficult to control - remain 300's.  No access for insulin gtt.  On cardene gtt.   VITAL SIGNS: Temp:  [95.8 F (35.4 C)-97.6 F (36.4 C)] 97.2 F (36.2 C) (01/21 0800) Pulse Rate:  [49-85] 81 (01/21 0900) Resp:  [0-25] 14 (01/21 0900) BP: (93-172)/(39-108) 110/60 (01/21 0900) SpO2:  [98 %-100 %] 100 % (01/21 0900) Arterial Line BP: (90-210)/(43-103) 135/52 (01/21 0900) FiO2 (%):  [40 %-100 %] 40 % (01/21 0900) Weight:  [67.2 kg (148 lb 2.4 oz)] 67.2 kg (148 lb 2.4 oz) (01/20 2029) HEMODYNAMICS:   VENTILATOR SETTINGS: Vent Mode: PRVC FiO2 (%):  [40 %-100 %] 40 % Set Rate:  [15 bmp] 15 bmp Vt Set:  [500 mL] 500 mL PEEP:  [5 cmH20] 5 cmH20 Plateau Pressure:  [10 cmH20-17 cmH20] 10 cmH20 INTAKE / OUTPUT:  Intake/Output Summary (Last 24 hours) at 12/13/16 0932 Last data filed at 12/13/16 0900  Gross per 24 hour  Intake          1639.71 ml  Output             1500 ml  Net           139.71 ml    PHYSICAL EXAMINATION: General:  NAD  Neuro:  Awake, slow to respond but follows some commands, dense L hemiplegia HEENT:  ETT Cardiovascular:  RRR, +murmur Lungs: resps even non labored on PS 8/5, diminished bases  otherwise clear  Abdomen:  Soft non tender Musculoskeletal:  Warm and dry, no sig edema     LABS:  CBC  Recent Labs Lab 11/28/2016 2023 12/07/2016 2100  WBC  --  8.4  HGB 10.5* 9.1*  HCT 31.0* 29.0*  PLT  --  433*   Coag's  Recent Labs Lab 12/15/2016 2100  APTT 35  INR 1.04   BMET  Recent Labs Lab 12/01/2016 2023 11/27/2016 2100  NA 130* 133*  K 6.3* 5.0  CL 97* 99*  CO2  --  22  BUN 41* 28*  CREATININE 1.80* 1.83*  GLUCOSE 413* 399*   Electrolytes  Recent Labs Lab 12/11/2016 2100  CALCIUM 8.5*   Sepsis Markers No results for input(s): LATICACIDVEN, PROCALCITON, O2SATVEN in the last 168 hours. ABG  Recent Labs Lab 12/13/16 0240  PHART 7.362  PCO2ART 35.1  PO2ART 166*   Liver Enzymes  Recent Labs Lab 12/14/2016 2100  AST 19  ALT 13*  ALKPHOS 85  BILITOT 0.3  ALBUMIN 2.6*   Cardiac Enzymes No results for input(s): TROPONINI, PROBNP in the last 168 hours. Glucose  Recent Labs Lab 12/02/2016 2026 12/13/16 0344 12/13/16 0758  GLUCAP 381* 372* 390*    Imaging Ct Angio Head W Or  Wo Contrast  Result Date: 11/25/2016 CLINICAL DATA:  Left-sided weakness. Dens right MCA on noncontrast CT. EXAM: CT ANGIOGRAPHY HEAD AND NECK TECHNIQUE: Multidetector CT imaging of the head and neck was performed using the standard protocol during bolus administration of intravenous contrast. Multiplanar CT image reconstructions and MIPs were obtained to evaluate the vascular anatomy. Carotid stenosis measurements (when applicable) are obtained utilizing NASCET criteria, using the distal internal carotid diameter as the denominator. CONTRAST:  50 mL Isovue 370 COMPARISON:  None. FINDINGS: CTA NECK FINDINGS Aortic arch: Normal variant 4 vessel aortic arch with the left vertebral artery arising directly from the arch between the left common carotid and left subclavian arteries. Mild calcified plaque in the proximal left subclavian artery without stenosis. Widely patent  brachiocephalic and right subclavian arteries. Right carotid system: Patent with moderate calcified plaque about the bifurcation but without significant stenosis. Left carotid system: Patent with mild calcified plaque at the bifurcation. No stenosis. Vertebral arteries: Patent and codominant without evidence of dissection or significant stenosis. Mild focal calcified plaque in the proximal left V1 segment. Skeleton: Advanced disc degeneration at C5-6 with mild disc degeneration elsewhere in the cervical spine. Multilevel facet arthrosis, severe on the right at C3-4. Grade 1 anterolisthesis of C7 on T1, facet mediated. Other neck: Bilateral thyroid nodules, some of which are calcified. Largest nodule measures 1.3 cm on the right, below the threshold for recommendation of routine ultrasound follow-up. Upper chest: Mild mosaic attenuation in the right upper lobe, possibly air trapping. Review of the MIP images confirms the above findings CTA HEAD FINDINGS Anterior circulation: The internal carotid arteries are patent from skullbase to carotid termini with diffuse siphon atherosclerosis. There is mild bilateral supraclinoid stenosis. The right MCA is occluded at the mid to distal M1 level without significant branch vessel reconstitution. ACAs and left MCA are patent without evidence of major branch occlusion or significant proximal stenosis. The left A1 segment is severely hypoplastic, with the left A2 predominantly supplied from the right via the anterior communicating artery. No aneurysm. Posterior circulation: The intracranial vertebral arteries are patent with mild-to-moderate calcified plaque but no significant stenosis. PICA and SCA origins are patent. Basilar artery is widely patent. Posterior communicating arteries are diminutive or absent. PCAs are patent with mild atherosclerotic irregularity bilaterally but no significant proximal stenosis. No aneurysm. Venous sinuses: Not adequately assessed due to arterial  phase contrast timing. Anatomic variants: Hypoplastic left A1. Review of the MIP images confirms the above findings IMPRESSION: 1. Right M1 occlusion. 2. Intracranial atherosclerosis with mild bilateral ICA stenosis. 3. Atherosclerosis about the right greater than left carotid bifurcations in the neck without significant stenosis. These results were called by telephone at the time of interpretation on 12/07/2016 at 9:15 pm to Dr. Roland Rack , who verbally acknowledged these results. Electronically Signed   By: Logan Bores M.D.   On: 12/20/2016 21:27   Ct Angio Neck W Or Wo Contrast  Result Date: 11/26/2016 CLINICAL DATA:  Left-sided weakness. Dens right MCA on noncontrast CT. EXAM: CT ANGIOGRAPHY HEAD AND NECK TECHNIQUE: Multidetector CT imaging of the head and neck was performed using the standard protocol during bolus administration of intravenous contrast. Multiplanar CT image reconstructions and MIPs were obtained to evaluate the vascular anatomy. Carotid stenosis measurements (when applicable) are obtained utilizing NASCET criteria, using the distal internal carotid diameter as the denominator. CONTRAST:  50 mL Isovue 370 COMPARISON:  None. FINDINGS: CTA NECK FINDINGS Aortic arch: Normal variant 4 vessel aortic arch with the left  vertebral artery arising directly from the arch between the left common carotid and left subclavian arteries. Mild calcified plaque in the proximal left subclavian artery without stenosis. Widely patent brachiocephalic and right subclavian arteries. Right carotid system: Patent with moderate calcified plaque about the bifurcation but without significant stenosis. Left carotid system: Patent with mild calcified plaque at the bifurcation. No stenosis. Vertebral arteries: Patent and codominant without evidence of dissection or significant stenosis. Mild focal calcified plaque in the proximal left V1 segment. Skeleton: Advanced disc degeneration at C5-6 with mild disc  degeneration elsewhere in the cervical spine. Multilevel facet arthrosis, severe on the right at C3-4. Grade 1 anterolisthesis of C7 on T1, facet mediated. Other neck: Bilateral thyroid nodules, some of which are calcified. Largest nodule measures 1.3 cm on the right, below the threshold for recommendation of routine ultrasound follow-up. Upper chest: Mild mosaic attenuation in the right upper lobe, possibly air trapping. Review of the MIP images confirms the above findings CTA HEAD FINDINGS Anterior circulation: The internal carotid arteries are patent from skullbase to carotid termini with diffuse siphon atherosclerosis. There is mild bilateral supraclinoid stenosis. The right MCA is occluded at the mid to distal M1 level without significant branch vessel reconstitution. ACAs and left MCA are patent without evidence of major branch occlusion or significant proximal stenosis. The left A1 segment is severely hypoplastic, with the left A2 predominantly supplied from the right via the anterior communicating artery. No aneurysm. Posterior circulation: The intracranial vertebral arteries are patent with mild-to-moderate calcified plaque but no significant stenosis. PICA and SCA origins are patent. Basilar artery is widely patent. Posterior communicating arteries are diminutive or absent. PCAs are patent with mild atherosclerotic irregularity bilaterally but no significant proximal stenosis. No aneurysm. Venous sinuses: Not adequately assessed due to arterial phase contrast timing. Anatomic variants: Hypoplastic left A1. Review of the MIP images confirms the above findings IMPRESSION: 1. Right M1 occlusion. 2. Intracranial atherosclerosis with mild bilateral ICA stenosis. 3. Atherosclerosis about the right greater than left carotid bifurcations in the neck without significant stenosis. These results were called by telephone at the time of interpretation on 12/17/2016 at 9:15 pm to Dr. Roland Rack , who verbally  acknowledged these results. Electronically Signed   By: Logan Bores M.D.   On: 11/29/2016 21:27   Ir US Guide Vasc Access Right  Result Date: 12/13/2016 INDICATION: 81 year old female with emergent large vessel occlusion, involving right MCA territory. Last known well 7:30 p.m. December 12, 2016. Baseline MRS of 2 with good function. Candidate for tPA and she received tPA. CT aspects score of 10 EXAM: IR PERCUTANEOUS ART THORMBECTOMY/INFUSION INTRACRANIAL INCLUDE DIAG ANGIO; IR ANGIO INTRA EXTRACRAN SEL COM CAROTID INNOMINATE UNI LEFT MOD SED; IR ULTRASOUND GUIDANCE VASC ACCESS RIGHT COMPARISON:  CT 12/13/2016 MEDICATIONS: 2.0 g Ancef. The antibiotic was administered within 1 hour of the procedure ANESTHESIA/SEDATION: General endotracheal with anesthesia team CONTRAST:  200 cc contrast FLUOROSCOPY TIME:  Fluoroscopy Time: 43 minutes 18 seconds (1,247 mGy). COMPLICATIONS: None TECHNIQUE: Informed written consent was obtained from the patient's family after a thorough discussion of the procedural risks, benefits and alternatives. Specific risks discussed include: Bleeding, infection, contrast reaction, kidney injury/failure, need for further procedure/surgery, arterial injury or dissection, embolization to new territory, intracranial hemorrhage (10-15% risk), neurologic deterioration, cardiopulmonary collapse, death. All questions were addressed. Maximal Sterile Barrier Technique was utilized including during the procedure including caps, mask, sterile gowns, sterile gloves, sterile drape, hand hygiene and skin antiseptic. A timeout was performed prior to the  initiation of the procedure. Ultrasound survey of the right inguinal region was performed with images stored and sent to PACs. 11 blade scalpel was used to make a small incision. A micropuncture needle was used access the right common femoral artery under ultrasound. With excellent arterial blood flow returned, an .018 micro wire was passed through the  needle, observed to enter the abdominal aorta under fluoroscopy. The needle was removed, and a micropuncture sheath was placed over the wire. The inner dilator and wire were removed, and an 035 Bentson wire was advanced under fluoroscopy into the abdominal aorta. The sheath was removed and a standard 5 [place] vascular sheath was placed. The dilator was removed and the sheath was flushed. Ultrasound survey of the left inguinal region was performed with images stored and sent to PACs. A micropuncture needle was used access the left common femoral artery under ultrasound. With excellent arterial blood flow returned, and an .018 micro wire was passed through the needle, observed enter the abdominal aorta under fluoroscopy. The needle was removed, and a micropuncture sheath was placed over the wire. The inner dilator and wire were removed, and an 035 Bentson wire was advanced under fluoroscopy into the abdominal aorta. The sheath was removed and a standard 4 [place] vascular sheath was placed. The dilator was removed and the sheath was flushed. This sheath was used for arterial line. A 33F JB-1 diagnostic catheter was advanced over the wire to the proximal descending thoracic aorta. Wire was then removed. Double flush of the catheter was performed. Catheter was then used to select the innominate artery. Angiogram was performed. Using roadmap technique, the catheter was advanced over a standard Glidewire into right common carotid artery. Formal angiogram was performed. Exchange length Rosen wire was then passed through the diagnostic catheter to the distal common carotid artery and the diagnostic catheter was removed. The 5 French sheath was removed and exchanged for 8 French 55 centimeter BrightTip sheath. Sheath was flushed and attached to pressurized and heparinized saline bag for constant forward flow. Then an 8 [place], 95 cm Flowgate balloon tip catheter was prepared on the back table with inflation of the balloon with  50/50 concentration of dilute contrast. The balloon catheter was then advanced over the wire, positioned into the distal common carotid artery. Copious back flush was performed and the balloon catheter was attached to heparinized and pressurized saline bag for forward flow. Flowgate balloon catheter was then advanced over the Rosen wire into the distal internal carotid artery. Angiogram was performed for road map guide. The number aspiration system was then introduced through the balloon guide catheter, using a synchro soft 014 wire and a coaxial system of a 3 max catheter through Ace 68 catheter. Coaxial system was advanced into the intracranial segment of the right ICA. Given the extreme tortuosity, a coaxial system would not reached the face of the clot. An exchange was required using Rosen wire through the 3 max catheter. The balloon catheter, a 68, and the 3 max catheter were removed. A neuron max 80cm catheter was then advanced over the Rosen wire through the 8 French sheath, positioned in the distal common carotid artery. The coaxial 3 max and Ace 68 were then advanced over the synchro soft wire to the intracranial segment. Moderate atherosclerotic changes of the cavernous segment of the right ICA precluded advancement of the a 68 catheter over the 3 max catheter to the face of the thrombus. Only the 3 max catheter could be advanced to the face  of the thrombus. Direct aspiration (ADAPT technique) was then applied with the 3 max catheter. After aspiration, a control angiogram was performed through the Ace 68. A second pass with the 3 max catheter was performed. Control angiogram was performed. Three max catheter was then removed from the Ace 68, and Trevo Provue was advanced through the a 68 to the intracranial segment with the synchro soft micro wire. Microcatheter system was advanced into the internal carotid artery, to the level of the occlusion. The micro wire was then carefully advanced through the  occluded segment. Microcatheter was then push through the occluded segment and the wire was removed. Blood was then aspirated through the hub of the microcatheter, and a gentle contrast injection was performed confirming intraluminal position. A rotating hemostatic valve was then attached to the back end of the microcatheter, and a pressurized and heparinized saline bag was attached to the catheter. 4 x 40 solitaire device was then selected. Back flush was achieved at the rotating hemostatic valve, and then the device was gently advanced through the microcatheter to the distal end. The retriever was then unsheathed by withdrawing the microcatheter under fluoroscopy. Once the retriever was completely unsheathed, control angiogram was performed from the Ace 68 SOLUMBRA technique was then used with constant aspiration performed at the tip of the Ace 68 catheter as the retriever was gently and slowly withdrawn with fluoroscopic observation. Once the retriever was entirely removed from the system, free aspiration was confirmed at the hub of the intermediate catheter, with free blood return confirmed. The rotating hemostatic valve was reattached, and a control angiogram was performed. The neuro on max catheter and the Ace 68 catheter were removed. JB 1 catheter was used to select the left common carotid artery. Angiogram was performed of the left common carotid artery. Control angiogram was performed at the right common femoral artery puncture site. The 55 centimeter 8 French sheath was exchanged for a standard 9 French sheath at the common femoral artery puncture site. Patient tolerated the procedure well and remained hemodynamically stable throughout. No complications were encountered and no significant blood loss encountered. FINDINGS: Type 3 arch with significant tortuosity of the branch vessels. Right common carotid artery: Tortuous common carotid artery. Moderate calcified atherosclerotic plaque at the bifurcation.  Right external carotid artery: Patent with antegrade flow. Right internal carotid artery: Significant tortuosity of the right cervical internal carotid artery. The cervical segment is patent. Horizontal and vertical petrous segment patent. Patent cavernous segment with moderate atherosclerotic changes. Ophthalmic artery originates from typical location of the ICA. Occlusion of the proximal M1 segment of the right MCA. Patent anterior communicating artery with perfusion of the left ACA territory via the right ICA. Right ACA patent. Right MCA: Before aspiration and mechanical thrombectomy, there is no flow through the M1 segment. After the first pass with direct aspiration, there was TICI 2a flow restored AFter SOLUMBRA technique, TICI 3 flow was restored. Left common carotid artery with mild atherosclerotic changes. Significant tortuosity of the left common carotid artery. No significant stenosis at the origin of the cervical segment of the left ICA. The left intracranial segment is widely patent. With perfusion of the left frontal and parietal region. No significant for perfusion of the left temporal segment. Adequate puncture site at the right common femoral artery with no restriction of flow with the 8 French sheath. IMPRESSION: Status post right cerebral angiogram and mechanical thrombectomy of acute right MCA occlusion, with complete restoration of flow, TICI-3. Signed, Dulcy Fanny. Earleen Newport, DO  [facility] Electronically Signed   By: Corrie Mckusick D.O.   On: 12/13/2016 00:49   Portable Chest Xray  Result Date: 12/13/2016 CLINICAL DATA:  81 y/o  F; respiratory failure. EXAM: PORTABLE CHEST 1 VIEW COMPARISON:  None. FINDINGS: Pulmonary venous hypertension. Streaky opacities at lung bases probably represent atelectasis. Normal cardiac silhouette. Endotracheal tube is 3.7 cm from carina. Post median sternotomy. No pleural effusion. Bones are unremarkable.  IMPRESSION: Pulmonary venous hypertension. Bibasilar atelectasis. No focal consolidation. Endotracheal tube in satisfactory position. Electronically Signed   By: Kristine Garbe M.D.   On: 12/13/2016 01:51   Ir Percutaneous Art Thrombectomy/infusion Intracranial Inc Diag Angio  Result Date: 12/13/2016 INDICATION: 81 year old female with emergent large vessel occlusion, involving right MCA territory. Last known well 7:30 p.m. December 12, 2016. Baseline MRS of 2 with good function. Candidate for tPA and she received tPA. CT aspects score of 10 EXAM: IR PERCUTANEOUS ART THORMBECTOMY/INFUSION INTRACRANIAL INCLUDE DIAG ANGIO; IR ANGIO INTRA EXTRACRAN SEL COM CAROTID INNOMINATE UNI LEFT MOD SED; IR ULTRASOUND GUIDANCE VASC ACCESS RIGHT COMPARISON:  CT 11/25/2016 MEDICATIONS: 2.0 g Ancef. The antibiotic was administered within 1 hour of the procedure ANESTHESIA/SEDATION: General endotracheal with anesthesia team CONTRAST:  200 cc contrast FLUOROSCOPY TIME:  Fluoroscopy Time: 43 minutes 18 seconds (1,247 mGy). COMPLICATIONS: None TECHNIQUE: Informed written consent was obtained from the patient's family after a thorough discussion of the procedural risks, benefits and alternatives. Specific risks discussed include: Bleeding, infection, contrast reaction, kidney injury/failure, need for further procedure/surgery, arterial injury or dissection, embolization to new territory, intracranial hemorrhage (10-15% risk), neurologic deterioration, cardiopulmonary collapse, death. All questions were addressed. Maximal Sterile Barrier Technique was utilized including during the procedure including caps, mask, sterile gowns, sterile gloves, sterile drape, hand hygiene and skin antiseptic. A timeout was performed prior to the initiation of the procedure. Ultrasound survey of the right inguinal region was performed with images stored and sent to PACs. 11 blade scalpel was used to make a small incision. A micropuncture needle  was used access the right common femoral artery under ultrasound. With excellent arterial blood flow returned, an .018 micro wire was passed through the needle, observed to enter the abdominal aorta under fluoroscopy. The needle was removed, and a micropuncture sheath was placed over the wire. The inner dilator and wire were removed, and an 035 Bentson wire was advanced under fluoroscopy into the abdominal aorta. The sheath was removed and a standard 5 [place] vascular sheath was placed. The dilator was removed and the sheath was flushed. Ultrasound survey of the left inguinal region was performed with images stored and sent to PACs. A micropuncture needle was used access the left common femoral artery under ultrasound. With excellent arterial blood flow returned, and an .018 micro wire was passed through the needle, observed enter the abdominal aorta under fluoroscopy. The needle was removed, and a micropuncture sheath was placed over the wire. The inner dilator and wire were removed, and an 035 Bentson wire was advanced under fluoroscopy into the abdominal aorta. The sheath was removed and a standard 4 [place] vascular sheath was placed. The dilator was removed and the sheath was flushed. This sheath was used for arterial line. A 38F JB-1 diagnostic catheter was advanced over the wire to the proximal descending thoracic aorta. Wire was then removed. Double flush of the catheter was performed. Catheter was then used to select the innominate artery. Angiogram was performed. Using roadmap technique, the catheter was advanced over a  standard Glidewire into right common carotid artery. Formal angiogram was performed. Exchange length Rosen wire was then passed through the diagnostic catheter to the distal common carotid artery and the diagnostic catheter was removed. The 5 French sheath was removed and exchanged for 8 French 55 centimeter BrightTip sheath. Sheath was flushed and attached to pressurized and heparinized  saline bag for constant forward flow. Then an 8 [place], 95 cm Flowgate balloon tip catheter was prepared on the back table with inflation of the balloon with 50/50 concentration of dilute contrast. The balloon catheter was then advanced over the wire, positioned into the distal common carotid artery. Copious back flush was performed and the balloon catheter was attached to heparinized and pressurized saline bag for forward flow. Flowgate balloon catheter was then advanced over the Rosen wire into the distal internal carotid artery. Angiogram was performed for road map guide. The number aspiration system was then introduced through the balloon guide catheter, using a synchro soft 014 wire and a coaxial system of a 3 max catheter through Ace 68 catheter. Coaxial system was advanced into the intracranial segment of the right ICA. Given the extreme tortuosity, a coaxial system would not reached the face of the clot. An exchange was required using Rosen wire through the 3 max catheter. The balloon catheter, a 68, and the 3 max catheter were removed. A neuron max 80cm catheter was then advanced over the Rosen wire through the 8 French sheath, positioned in the distal common carotid artery. The coaxial 3 max and Ace 68 were then advanced over the synchro soft wire to the intracranial segment. Moderate atherosclerotic changes of the cavernous segment of the right ICA precluded advancement of the a 68 catheter over the 3 max catheter to the face of the thrombus. Only the 3 max catheter could be advanced to the face of the thrombus. Direct aspiration (ADAPT technique) was then applied with the 3 max catheter. After aspiration, a control angiogram was performed through the Ace 68. A second pass with the 3 max catheter was performed. Control angiogram was performed. Three max catheter was then removed from the Ace 68, and Trevo Provue was advanced through the a 68 to the intracranial segment with the synchro soft micro wire.  Microcatheter system was advanced into the internal carotid artery, to the level of the occlusion. The micro wire was then carefully advanced through the occluded segment. Microcatheter was then push through the occluded segment and the wire was removed. Blood was then aspirated through the hub of the microcatheter, and a gentle contrast injection was performed confirming intraluminal position. A rotating hemostatic valve was then attached to the back end of the microcatheter, and a pressurized and heparinized saline bag was attached to the catheter. 4 x 40 solitaire device was then selected. Back flush was achieved at the rotating hemostatic valve, and then the device was gently advanced through the microcatheter to the distal end. The retriever was then unsheathed by withdrawing the microcatheter under fluoroscopy. Once the retriever was completely unsheathed, control angiogram was performed from the Ace 68 SOLUMBRA technique was then used with constant aspiration performed at the tip of the Ace 68 catheter as the retriever was gently and slowly withdrawn with fluoroscopic observation. Once the retriever was entirely removed from the system, free aspiration was confirmed at the hub of the intermediate catheter, with free blood return confirmed. The rotating hemostatic valve was reattached, and a control angiogram was performed. The neuro on max catheter and the Ace  68 catheter were removed. JB 1 catheter was used to select the left common carotid artery. Angiogram was performed of the left common carotid artery. Control angiogram was performed at the right common femoral artery puncture site. The 55 centimeter 8 French sheath was exchanged for a standard 9 French sheath at the common femoral artery puncture site. Patient tolerated the procedure well and remained hemodynamically stable throughout. No complications were encountered and no significant blood loss encountered. FINDINGS: Type 3 arch with significant  tortuosity of the branch vessels. Right common carotid artery: Tortuous common carotid artery. Moderate calcified atherosclerotic plaque at the bifurcation. Right external carotid artery: Patent with antegrade flow. Right internal carotid artery: Significant tortuosity of the right cervical internal carotid artery. The cervical segment is patent. Horizontal and vertical petrous segment patent. Patent cavernous segment with moderate atherosclerotic changes. Ophthalmic artery originates from typical location of the ICA. Occlusion of the proximal M1 segment of the right MCA. Patent anterior communicating artery with perfusion of the left ACA territory via the right ICA. Right ACA patent. Right MCA: Before aspiration and mechanical thrombectomy, there is no flow through the M1 segment. After the first pass with direct aspiration, there was TICI 2a flow restored AFter SOLUMBRA technique, TICI 3 flow was restored. Left common carotid artery with mild atherosclerotic changes. Significant tortuosity of the left common carotid artery. No significant stenosis at the origin of the cervical segment of the left ICA. The left intracranial segment is widely patent. With perfusion of the left frontal and parietal region. No significant for perfusion of the left temporal segment. Adequate puncture site at the right common femoral artery with no restriction of flow with the 8 French sheath. IMPRESSION: Status post right cerebral angiogram and mechanical thrombectomy of acute right MCA occlusion, with complete restoration of flow, TICI-3. Signed, Dulcy Fanny. Earleen Newport, DO [facility] Radiology Electronically Signed   By: Corrie Mckusick D.O.   On: 12/13/2016 00:49   Ct Head Code Stroke W/o Cm  Addendum Date: 11/25/2016   ADDENDUM REPORT: 11/26/2016 20:57 ADDENDUM: Chronic appearing left cerebellar and right occipital lobe infarcts. Electronically Signed   By: Logan Bores M.D.   On:  11/28/2016 20:57   Result Date: 12/13/2016 CLINICAL DATA:  Code stroke.  Left-sided weakness. EXAM: CT HEAD WITHOUT CONTRAST TECHNIQUE: Contiguous axial images were obtained from the base of the skull through the vertex without intravenous contrast. COMPARISON:  None. FINDINGS: Brain: A right occipital lobe infarct is present and is favored to be chronic. There is a small chronic appearing left cerebellar infarct. Elsewhere, there is no evidence of acute cortical infarct, intracranial hemorrhage, mass, midline shift, or extra-axial fluid collection. There is moderate cerebral atrophy. Cerebral white matter hypodensities are nonspecific but compatible with moderate chronic small vessel ischemic disease. A mega cisterna magna is incidentally noted. Vascular: Calcified atherosclerosis at the skullbase. Dense right MCA. Skull: No fracture or focal osseous lesion. Sinuses/Orbits: The visualized paranasal sinuses and mastoid air cells are clear. Prior bilateral cataract extraction is noted. Other: None. ASPECTS [facility] Stroke Program Early CT Score) - Ganglionic level infarction (caudate, lentiform nuclei, internal capsule, insula, M1-M3 cortex): 7 - Supraganglionic infarction (M4-M6 cortex): 3 Total score (0-10 with 10 being normal): 10 IMPRESSION: 1. No acute intracranial hemorrhage or definite acute infarct identified. 2. ASPECTS is 10. 3. Dense right MCA concerning for acute thrombus. 4. Moderate chronic small vessel ischemic disease and cerebral atrophy. These results were called by telephone at the time of  interpretation on 12/23/2016 at 8:28 pm to Dr. Leonel Ramsay, who verbally acknowledged these results. Electronically Signed: By: Logan Bores M.D. On: 12/11/2016 20:33   Ir Angio Intra Extracran Sel Com Carotid Innominate Uni L Mod Sed  Result Date: 12/13/2016 INDICATION: 81 year old female with emergent large vessel occlusion, involving right MCA territory. Last known well 7:30 p.m. December 12, 2016.  Baseline MRS of 2 with good function. Candidate for tPA and she received tPA. CT aspects score of 10 EXAM: IR PERCUTANEOUS ART THORMBECTOMY/INFUSION INTRACRANIAL INCLUDE DIAG ANGIO; IR ANGIO INTRA EXTRACRAN SEL COM CAROTID INNOMINATE UNI LEFT MOD SED; IR ULTRASOUND GUIDANCE VASC ACCESS RIGHT COMPARISON:  CT 11/24/2016 MEDICATIONS: 2.0 g Ancef. The antibiotic was administered within 1 hour of the procedure ANESTHESIA/SEDATION: General endotracheal with anesthesia team CONTRAST:  200 cc contrast FLUOROSCOPY TIME:  Fluoroscopy Time: 43 minutes 18 seconds (1,247 mGy). COMPLICATIONS: None TECHNIQUE: Informed written consent was obtained from the patient's family after a thorough discussion of the procedural risks, benefits and alternatives. Specific risks discussed include: Bleeding, infection, contrast reaction, kidney injury/failure, need for further procedure/surgery, arterial injury or dissection, embolization to new territory, intracranial hemorrhage (10-15% risk), neurologic deterioration, cardiopulmonary collapse, death. All questions were addressed. Maximal Sterile Barrier Technique was utilized including during the procedure including caps, mask, sterile gowns, sterile gloves, sterile drape, hand hygiene and skin antiseptic. A timeout was performed prior to the initiation of the procedure. Ultrasound survey of the right inguinal region was performed with images stored and sent to PACs. 11 blade scalpel was used to make a small incision. A micropuncture needle was used access the right common femoral artery under ultrasound. With excellent arterial blood flow returned, an .018 micro wire was passed through the needle, observed to enter the abdominal aorta under fluoroscopy. The needle was removed, and a micropuncture sheath was placed over the wire. The inner dilator and wire were removed, and an 035 Bentson wire was advanced under fluoroscopy into the abdominal aorta. The sheath was removed and a standard 5  [place] vascular sheath was placed. The dilator was removed and the sheath was flushed. Ultrasound survey of the left inguinal region was performed with images stored and sent to PACs. A micropuncture needle was used access the left common femoral artery under ultrasound. With excellent arterial blood flow returned, and an .018 micro wire was passed through the needle, observed enter the abdominal aorta under fluoroscopy. The needle was removed, and a micropuncture sheath was placed over the wire. The inner dilator and wire were removed, and an 035 Bentson wire was advanced under fluoroscopy into the abdominal aorta. The sheath was removed and a standard 4 [place] vascular sheath was placed. The dilator was removed and the sheath was flushed. This sheath was used for arterial line. A 23F JB-1 diagnostic catheter was advanced over the wire to the proximal descending thoracic aorta. Wire was then removed. Double flush of the catheter was performed. Catheter was then used to select the innominate artery. Angiogram was performed. Using roadmap technique, the catheter was advanced over a standard Glidewire into right common carotid artery. Formal angiogram was performed. Exchange length Rosen wire was then passed through the diagnostic catheter to the distal common carotid artery and the diagnostic catheter was removed. The 5 French sheath was removed and exchanged for 8 French 55 centimeter BrightTip sheath. Sheath was flushed and attached to pressurized and heparinized saline bag for constant forward flow. Then an 8 [place], 95 cm Flowgate balloon tip catheter was prepared on the  back table with inflation of the balloon with 50/50 concentration of dilute contrast. The balloon catheter was then advanced over the wire, positioned into the distal common carotid artery. Copious back flush was performed and the balloon catheter was attached to heparinized and pressurized saline bag for forward flow. Flowgate balloon catheter  was then advanced over the Rosen wire into the distal internal carotid artery. Angiogram was performed for road map guide. The number aspiration system was then introduced through the balloon guide catheter, using a synchro soft 014 wire and a coaxial system of a 3 max catheter through Ace 68 catheter. Coaxial system was advanced into the intracranial segment of the right ICA. Given the extreme tortuosity, a coaxial system would not reached the face of the clot. An exchange was required using Rosen wire through the 3 max catheter. The balloon catheter, a 68, and the 3 max catheter were removed. A neuron max 80cm catheter was then advanced over the Rosen wire through the 8 French sheath, positioned in the distal common carotid artery. The coaxial 3 max and Ace 68 were then advanced over the synchro soft wire to the intracranial segment. Moderate atherosclerotic changes of the cavernous segment of the right ICA precluded advancement of the a 68 catheter over the 3 max catheter to the face of the thrombus. Only the 3 max catheter could be advanced to the face of the thrombus. Direct aspiration (ADAPT technique) was then applied with the 3 max catheter. After aspiration, a control angiogram was performed through the Ace 68. A second pass with the 3 max catheter was performed. Control angiogram was performed. Three max catheter was then removed from the Ace 68, and Trevo Provue was advanced through the a 68 to the intracranial segment with the synchro soft micro wire. Microcatheter system was advanced into the internal carotid artery, to the level of the occlusion. The micro wire was then carefully advanced through the occluded segment. Microcatheter was then push through the occluded segment and the wire was removed. Blood was then aspirated through the hub of the microcatheter, and a gentle contrast injection was performed confirming intraluminal position. A rotating hemostatic valve was then attached to the back end  of the microcatheter, and a pressurized and heparinized saline bag was attached to the catheter. 4 x 40 solitaire device was then selected. Back flush was achieved at the rotating hemostatic valve, and then the device was gently advanced through the microcatheter to the distal end. The retriever was then unsheathed by withdrawing the microcatheter under fluoroscopy. Once the retriever was completely unsheathed, control angiogram was performed from the Ace 68 SOLUMBRA technique was then used with constant aspiration performed at the tip of the Ace 68 catheter as the retriever was gently and slowly withdrawn with fluoroscopic observation. Once the retriever was entirely removed from the system, free aspiration was confirmed at the hub of the intermediate catheter, with free blood return confirmed. The rotating hemostatic valve was reattached, and a control angiogram was performed. The neuro on max catheter and the Ace 68 catheter were removed. JB 1 catheter was used to select the left common carotid artery. Angiogram was performed of the left common carotid artery. Control angiogram was performed at the right common femoral artery puncture site. The 55 centimeter 8 French sheath was exchanged for a standard 9 French sheath at the common femoral artery puncture site. Patient tolerated the procedure well and remained hemodynamically stable throughout. No complications were encountered and no significant blood loss encountered.  FINDINGS: Type 3 arch with significant tortuosity of the branch vessels. Right common carotid artery: Tortuous common carotid artery. Moderate calcified atherosclerotic plaque at the bifurcation. Right external carotid artery: Patent with antegrade flow. Right internal carotid artery: Significant tortuosity of the right cervical internal carotid artery. The cervical segment is patent. Horizontal and vertical petrous segment patent. Patent cavernous segment with moderate atherosclerotic changes.  Ophthalmic artery originates from typical location of the ICA. Occlusion of the proximal M1 segment of the right MCA. Patent anterior communicating artery with perfusion of the left ACA territory via the right ICA. Right ACA patent. Right MCA: Before aspiration and mechanical thrombectomy, there is no flow through the M1 segment. After the first pass with direct aspiration, there was TICI 2a flow restored AFter SOLUMBRA technique, TICI 3 flow was restored. Left common carotid artery with mild atherosclerotic changes. Significant tortuosity of the left common carotid artery. No significant stenosis at the origin of the cervical segment of the left ICA. The left intracranial segment is widely patent. With perfusion of the left frontal and parietal region. No significant for perfusion of the left temporal segment. Adequate puncture site at the right common femoral artery with no restriction of flow with the 8 French sheath. IMPRESSION: Status post right cerebral angiogram and mechanical thrombectomy of acute right MCA occlusion, with complete restoration of flow, TICI-3. Signed, Dulcy Fanny. Earleen Newport, DO [facility] Radiology Electronically Signed   By: Corrie Mckusick D.O.   On: 12/13/2016 00:49   TUBES/LINES:  ETT 1/20>>>   STUDIES:  CTA head 1/20>>> 1. Right M1 occlusion. 2. Intracranial atherosclerosis with mild bilateral ICA stenosis. 3. Atherosclerosis about the right greater than left carotid bifurcations in the neck without significant stenosis. CT head 1/21>>>  MICRO:  ABX:    ASSESSMENT / PLAN: 81yo CF with dense R MCA ischemic type CVA s/p IV TPA and mechanical thrombectomy.   NEUROLOGIC A:   Right sided MCA - s/p thrombectomy and systemic TPA (completed at 2236) Sedation needs on vent  P:   RASS goal: 0 Fentanyl gtt  Daily WUA  Per neurology  Hold needle sticks x 12-24 hours post TPA    PULMONARY A: Left intubated for airway  protection P:   Vent support - 8cc/kg  F/u CXR  F/u ABG Daily SBT  May be ready for extubation this afternoon after repeat CT head    CARDIOVASCULAR A:  CAD Afib HTN  P:  Continue cardene gtt for goal SBP 120-140  Add low dose metoprolol IV (home med)  Hold plavix  May need to resume home amiodarone - hold for now with no enteral access    RENAL A:   CKD - unclear basline P:   F/u chem  Monitor post contrast   GASTROINTESTINAL A:   No active issue  P:   NPO  PPI  Hold OG placement for now post TPA - if not extubated today will need OG and TF   HEMATOLOGIC A:   Anemia - likely chronic disease - unknown baseline P:  F/u cbc  Anemia panel pending   INFECTIOUS A:   No active issue  P:   Monitor wbc, fever curve off abx    ENDOCRINE A:   DM-2 - very difficult to control     P:   lantus 12 units daily  SSI  May need insulin gtt - holding for now with limited IV access, post tpa  (will place orders on hold for  now)     Nickolas Madrid, NP 12/13/2016  9:32 AM Pager: [phone] or [phone]

## 2016-12-13 NOTE — Progress Notes (Signed)
Peripherally Inserted Central Catheter/Midline Placement  The IV Nurse has discussed with the patient and/or persons authorized to consent for the patient, the purpose of this procedure and the potential benefits and risks involved with this procedure.  The benefits include less needle sticks, lab draws from the catheter, and the patient may be discharged home with the catheter. Risks include, but not limited to, infection, bleeding, blood clot (thrombus formation), and puncture of an artery; nerve damage and irregular heartbeat and possibility to perform a PICC exchange if needed/ordered by physician.  Alternatives to this procedure were also discussed.  Bard Power PICC patient education guide, fact sheet on infection prevention and patient information card has been provided to patient /or left at bedside.  Telephone consent obtained by Jerilee Hoh.  No family at bedside.  PICC/Midline Placement Documentation  PICC Double Lumen 12/13/16 PICC Right Brachial 40 cm 3 cm (Active)  Indication for Insertion or Continuance of Line Vasoactive infusions;Prolonged intravenous therapies 12/13/2016 12:58 PM  Exposed Catheter (cm) 3 cm 12/13/2016 12:58 PM  Site Assessment Clean;Dry;Intact 12/13/2016 12:58 PM  Lumen #1 Status Flushed;Saline locked;Blood return noted 12/13/2016 12:58 PM  Lumen #2 Status Flushed;Saline locked;Blood return noted 12/13/2016 12:58 PM  Dressing Type Transparent 12/13/2016 12:58 PM  Dressing Status Clean;Dry;Intact;Antimicrobial disc in place 12/13/2016 12:58 PM  Line Care Connections checked and tightened 12/13/2016 12:58 PM  Line Adjustment (NICU/IV Team Only) No 12/13/2016 12:58 PM  Dressing Intervention New dressing 12/13/2016 12:58 PM  Dressing Change Due 12/20/16 12/13/2016 12:58 PM       Rolena Infante 12/13/2016, 12:58 PM

## 2016-12-13 NOTE — Progress Notes (Signed)
RT transported pt from 3M08 to MRI 1 and back to 3M08 on ventilator. Pt stable throughout with no complications. VS remained within normal limits.  RT remained with patient throughout duration of MRI scan and transports. RT will continue to monitor.

## 2016-12-13 NOTE — Progress Notes (Signed)
Patient transported on vent from CT to [id number] without complication.

## 2016-12-13 NOTE — Anesthesia Postprocedure Evaluation (Addendum)
Anesthesia Post Note  Patient: Julia Madden  Procedure(s) Performed: Procedure(s) (LRB): RADIOLOGY WITH ANESTHESIA (N/A)  Patient location during evaluation: SICU Anesthesia Type: General Level of consciousness: sedated Pain management: pain level controlled Vital Signs Assessment: post-procedure vital signs reviewed and stable Respiratory status: patient remains intubated per anesthesia plan Cardiovascular status: stable Anesthetic complications: no       Last Vitals:  Vitals:   12/13/16 0545 12/13/16 0600  BP:    Pulse: 75 74  Resp: (!) 21 19  Temp:      Last Pain:  Vitals:   12/13/16 0400  TempSrc: Axillary                 Effie Berkshire

## 2016-12-13 NOTE — Progress Notes (Signed)
SLP Cancellation Note  Patient Details Name: Julia Madden MRN: [id number] DOB: 15-Dec-1935   Cancelled treatment:       Reason Eval/Treat Not Completed: Medical issues which prohibited therapy. Patient remains intubated. SLP will f/u as medically appropriate.  Deneise Lever, [place] CF-SLP Speech-Language Pathologist [phone]   Aliene Altes 12/13/2016, 5:33 PM

## 2016-12-14 ENCOUNTER — Encounter (HOSPITAL_COMMUNITY): Payer: Self-pay | Admitting: Interventional Radiology

## 2016-12-14 ENCOUNTER — Inpatient Hospital Stay (HOSPITAL_COMMUNITY): Payer: Medicare Other

## 2016-12-14 DIAGNOSIS — I10 Essential (primary) hypertension: Secondary | ICD-10-CM

## 2016-12-14 DIAGNOSIS — I6300 Cerebral infarction due to thrombosis of unspecified precerebral artery: Secondary | ICD-10-CM

## 2016-12-14 DIAGNOSIS — I4891 Unspecified atrial fibrillation: Secondary | ICD-10-CM

## 2016-12-14 DIAGNOSIS — I6789 Other cerebrovascular disease: Secondary | ICD-10-CM

## 2016-12-14 LAB — GLUCOSE, CAPILLARY
GLUCOSE-CAPILLARY: 152 mg/dL — AB (ref 65–99)
GLUCOSE-CAPILLARY: 107 mg/dL — AB (ref 65–99)
GLUCOSE-CAPILLARY: 67 mg/dL (ref 65–99)
Glucose-Capillary: 108 mg/dL — ABNORMAL HIGH (ref 65–99)
Glucose-Capillary: 75 mg/dL (ref 65–99)
Glucose-Capillary: 105 mg/dL — ABNORMAL HIGH (ref 65–99)

## 2016-12-14 LAB — ECHOCARDIOGRAM COMPLETE
HEIGHTINCHES: 64 in
WEIGHTICAEL: 2370.39 [oz_av]

## 2016-12-14 LAB — BASIC METABOLIC PANEL
Anion gap: 9 (ref 5–15)
BUN: 31 mg/dL — AB (ref 6–20)
CHLORIDE: 115 mmol/L — AB (ref 101–111)
CO2: 21 mmol/L — AB (ref 22–32)
Calcium: 8.3 mg/dL — ABNORMAL LOW (ref 8.9–10.3)
Creatinine, Ser: 2.09 mg/dL — ABNORMAL HIGH (ref 0.44–1.00)
GFR calc Af Amer: 25 mL/min — ABNORMAL LOW (ref >60)
GFR calc non Af Amer: 22 mL/min — ABNORMAL LOW (ref >60)
Glucose, Bld: 123 mg/dL — ABNORMAL HIGH (ref 65–99)
POTASSIUM: 4.3 mmol/L (ref 3.5–5.1)
SODIUM: 145 mmol/L (ref 135–145)

## 2016-12-14 LAB — URINE CULTURE

## 2016-12-14 LAB — CBC
HCT: 22.3 % — ABNORMAL LOW (ref 36.0–46.0)
HEMATOCRIT: 22.2 % — AB (ref 36.0–46.0)
HEMOGLOBIN: 7 g/dL — AB (ref 12.0–15.0)
HEMOGLOBIN: 7.1 g/dL — AB (ref 12.0–15.0)
MCH: 26.6 pg (ref 26.0–34.0)
MCH: 27 pg (ref 26.0–34.0)
MCHC: 31.5 g/dL (ref 30.0–36.0)
MCHC: 31.8 g/dL (ref 30.0–36.0)
MCV: 84.4 fL (ref 78.0–100.0)
MCV: 84.8 fL (ref 78.0–100.0)
PLATELETS: 429 10*3/uL — AB (ref 150–400)
Platelets: 437 10*3/uL — ABNORMAL HIGH (ref 150–400)
RBC: 2.63 MIL/uL — AB (ref 3.87–5.11)
RBC: 2.63 MIL/uL — AB (ref 3.87–5.11)
RDW: 16.7 % — ABNORMAL HIGH (ref 11.5–15.5)
RDW: 17 % — ABNORMAL HIGH (ref 11.5–15.5)
WBC: 17.5 10*3/uL — ABNORMAL HIGH (ref 4.0–10.5)
WBC: 17.8 10*3/uL — AB (ref 4.0–10.5)

## 2016-12-14 LAB — SODIUM
SODIUM: 147 mmol/L — AB (ref 135–145)
Sodium: 146 mmol/L — ABNORMAL HIGH (ref 135–145)
Sodium: 157 mmol/L — ABNORMAL HIGH (ref 135–145)
Sodium: 160 mmol/L — ABNORMAL HIGH (ref 135–145)

## 2016-12-14 LAB — MAGNESIUM
MAGNESIUM: 1.9 mg/dL (ref 1.7–2.4)
MAGNESIUM: 2 mg/dL (ref 1.7–2.4)
Magnesium: 1.9 mg/dL (ref 1.7–2.4)

## 2016-12-14 LAB — PHOSPHORUS
PHOSPHORUS: 4.7 mg/dL — AB (ref 2.5–4.6)
PHOSPHORUS: 4.8 mg/dL — AB (ref 2.5–4.6)
Phosphorus: 5.2 mg/dL — ABNORMAL HIGH (ref 2.5–4.6)

## 2016-12-14 LAB — HEMOGLOBIN A1C
HEMOGLOBIN A1C: 11.7 % — AB (ref 4.8–5.6)
MEAN PLASMA GLUCOSE: 289 mg/dL

## 2016-12-14 MED ORDER — VITAL HIGH PROTEIN PO LIQD: 1000.0000 mL | ORAL | Status: DC

## 2016-12-14 MED ORDER — PRO-STAT SUGAR FREE PO LIQD
30.0000 mL | Freq: Two times a day (BID) | ORAL | Status: DC
  Filled 2016-12-14: qty 30

## 2016-12-14 NOTE — Progress Notes (Signed)
Referring Physician(s): Dr Royal Hawthorn  Supervising Physician: Corrie Mckusick  Patient Status:  [facility] - In-pt  Chief Complaint:  CVA R MCA Revascularization 12/22/2016: Cerebral angio, with right CCA/ICA, and left CCA Mechanical thrombectomy of right MCA occlusion.  Penumbra, then Penumbra & solitaire   Subjective:  On vent No response No movement B groins no bleeding No hematoma  Allergies: Latex and Tape  Medications: Prior to Admission medications   Medication Sig Start Date End Date Taking? Authorizing Provider  acetaminophen (TYLENOL) 325 MG tablet Take 325 mg by mouth every 6 (six) hours as needed for headache (or pain).    Yes Historical Provider, MD  amiodarone (PACERONE) 200 MG tablet Take 1 tablet (200 mg total) by mouth daily. 12/04/16  Yes Jettie Booze, MD  amLODipine (NORVASC) 2.5 MG tablet Take 2.5 mg by mouth daily.   Yes Historical Provider, MD  aspirin EC 81 MG tablet Take 81 mg by mouth daily.   Yes Historical Provider, MD  atorvastatin (LIPITOR) 40 MG tablet Take 40 mg by mouth at bedtime.   Yes Historical Provider, MD  clopidogrel (PLAVIX) 75 MG tablet Take 75 mg by mouth daily.   Yes Historical Provider, MD  Cranberry 500 MG CAPS Take 1,000 mg by mouth 2 (two) times daily.   Yes Historical Provider, MD  DULoxetine (CYMBALTA) 60 MG capsule Take 60 mg by mouth daily.   Yes Historical Provider, MD  insulin glargine (LANTUS) 100 UNIT/ML injection Inject 0.12 mLs (12 Units total) into the skin at bedtime. 12/04/16  Yes Philemon Kingdom, MD  insulin lispro (HUMALOG) 100 UNIT/ML injection Inject 3-5 Units into the skin 3 (three) times daily before meals.  12/04/16  Yes Jettie Booze, MD  Insulin Pen Needle (CAREFINE PEN NEEDLES) 32G X 4 MM MISC Use 4x a day 12/03/16  Yes Philemon Kingdom, MD  metoprolol tartrate (LOPRESSOR) 25 MG tablet Take 1 tablet (25 mg total) by mouth 2 (two) times daily. 12/04/16  Yes Jettie Booze, MD  Multiple Vitamins-Minerals  (ONE-A-DAY WOMENS 50+ ADVANTAGE) TABS Take 1 tablet by mouth daily.   Yes Historical Provider, MD     Vital Signs: BP (!) 148/65 (BP Location: Left Arm)   Pulse 70   Temp 97.2 F (36.2 C) (Axillary)   Resp 14   Ht 5\' 4"  (1.626 m)   Wt 148 lb 2.4 oz (67.2 kg)   LMP  (LMP Unknown)   SpO2 100%   BMI 25.43 kg/m   Physical Exam  Cardiovascular: Normal rate.   Pulmonary/Chest:  vent  Abdominal: Soft.  Musculoskeletal:  No movement  Neurological:  No repsonse  Skin: Skin is warm.  Nursing note and vitals reviewed.  MR 1/21: IMPRESSION: 1. Large acute infarct involving nearly the entirety of the right MCA territory. Extensive cytotoxic edema with 4 mm midline shift. Extensive confluent petechial type hemorrhage with areas of more discrete hematoma formation in in the right insula and temporoparietal regions. 2. Moderate chronic small vessel ischemic disease.  Imaging: Ct Angio Head W Or Wo Contrast  Result Date: 12/14/2016 CLINICAL DATA:  Left-sided weakness. Dens right MCA on noncontrast CT. EXAM: CT ANGIOGRAPHY HEAD AND NECK TECHNIQUE: Multidetector CT imaging of the head and neck was performed using the standard protocol during bolus administration of intravenous contrast. Multiplanar CT image reconstructions and MIPs were obtained to evaluate the vascular anatomy. Carotid stenosis measurements (when applicable) are obtained utilizing NASCET criteria, using the distal internal carotid diameter as the denominator. CONTRAST:  50 mL Isovue 370 COMPARISON:  None. FINDINGS: CTA NECK FINDINGS Aortic arch: Normal variant 4 vessel aortic arch with the left vertebral artery arising directly from the arch between the left common carotid and left subclavian arteries. Mild calcified plaque in the proximal left subclavian artery without stenosis. Widely patent brachiocephalic and right subclavian arteries. Right carotid system: Patent with moderate calcified plaque about the bifurcation but  without significant stenosis. Left carotid system: Patent with mild calcified plaque at the bifurcation. No stenosis. Vertebral arteries: Patent and codominant without evidence of dissection or significant stenosis. Mild focal calcified plaque in the proximal left V1 segment. Skeleton: Advanced disc degeneration at C5-6 with mild disc degeneration elsewhere in the cervical spine. Multilevel facet arthrosis, severe on the right at C3-4. Grade 1 anterolisthesis of C7 on T1, facet mediated. Other neck: Bilateral thyroid nodules, some of which are calcified. Largest nodule measures 1.3 cm on the right, below the threshold for recommendation of routine ultrasound follow-up. Upper chest: Mild mosaic attenuation in the right upper lobe, possibly air trapping. Review of the MIP images confirms the above findings CTA HEAD FINDINGS Anterior circulation: The internal carotid arteries are patent from skullbase to carotid termini with diffuse siphon atherosclerosis. There is mild bilateral supraclinoid stenosis. The right MCA is occluded at the mid to distal M1 level without significant branch vessel reconstitution. ACAs and left MCA are patent without evidence of major branch occlusion or significant proximal stenosis. The left A1 segment is severely hypoplastic, with the left A2 predominantly supplied from the right via the anterior communicating artery. No aneurysm. Posterior circulation: The intracranial vertebral arteries are patent with mild-to-moderate calcified plaque but no significant stenosis. PICA and SCA origins are patent. Basilar artery is widely patent. Posterior communicating arteries are diminutive or absent. PCAs are patent with mild atherosclerotic irregularity bilaterally but no significant proximal stenosis. No aneurysm. Venous sinuses: Not adequately assessed due to arterial phase contrast timing. Anatomic variants: Hypoplastic left A1. Review of the MIP images confirms the above findings IMPRESSION: 1.  Right M1 occlusion. 2. Intracranial atherosclerosis with mild bilateral ICA stenosis. 3. Atherosclerosis about the right greater than left carotid bifurcations in the neck without significant stenosis. These results were called by telephone at the time of interpretation on 12/11/2016 at 9:15 pm to Dr. Roland Rack , who verbally acknowledged these results. Electronically Signed   By: Logan Bores M.D.   On: 12/09/2016 21:27   Ct Head Wo Contrast  Result Date: 12/13/2016 CLINICAL DATA:  Followup right thrombectomy. Acute stroke presentation yesterday with right brain stroke. EXAM: CT HEAD WITHOUT CONTRAST TECHNIQUE: Contiguous axial images were obtained from the base of the skull through the vertex without intravenous contrast. COMPARISON:  Multiple examinations done yesterday. FINDINGS: Brain: Brainstem is unremarkable. Old left cerebellar infarction. Left cerebral hemisphere shows atrophy and chronic small vessel disease without acute insult. On the right, there is swelling and hyperdensity within the right middle cerebral artery territory affecting the lateral temporal lobe, posterior frontal and parietal brain. Hyperdensity can be seen with contrast staining following intervention, but the degree of brain swelling suggests that there may be completed infarction in this region. Some petechial blood products cannot be excluded. There is mild swelling but no mass effect or shift. No hydrocephalus. No extra-axial collection. Vascular: Some opacity of the vessels consistent with residual contrast. Skull: Negative Sinuses/Orbits: Clear/normal Other: None significant IMPRESSION: Status post right MCA thrombectomy. Swelling in the right MCA territory with slight hyperdensity. Some of this hyperdensity could be  contrast staining, but some could also represent petechial blood products. The degree of swelling raises concern for the possibility of some cerebral infarction, though again, some edema can be seen  following intervention. Electronically Signed   By: Nelson Chimes M.D.   On: 12/13/2016 11:47   Ct Angio Neck W Or Wo Contrast  Result Date: 11/23/2016 CLINICAL DATA:  Left-sided weakness. Dens right MCA on noncontrast CT. EXAM: CT ANGIOGRAPHY HEAD AND NECK TECHNIQUE: Multidetector CT imaging of the head and neck was performed using the standard protocol during bolus administration of intravenous contrast. Multiplanar CT image reconstructions and MIPs were obtained to evaluate the vascular anatomy. Carotid stenosis measurements (when applicable) are obtained utilizing NASCET criteria, using the distal internal carotid diameter as the denominator. CONTRAST:  50 mL Isovue 370 COMPARISON:  None. FINDINGS: CTA NECK FINDINGS Aortic arch: Normal variant 4 vessel aortic arch with the left vertebral artery arising directly from the arch between the left common carotid and left subclavian arteries. Mild calcified plaque in the proximal left subclavian artery without stenosis. Widely patent brachiocephalic and right subclavian arteries. Right carotid system: Patent with moderate calcified plaque about the bifurcation but without significant stenosis. Left carotid system: Patent with mild calcified plaque at the bifurcation. No stenosis. Vertebral arteries: Patent and codominant without evidence of dissection or significant stenosis. Mild focal calcified plaque in the proximal left V1 segment. Skeleton: Advanced disc degeneration at C5-6 with mild disc degeneration elsewhere in the cervical spine. Multilevel facet arthrosis, severe on the right at C3-4. Grade 1 anterolisthesis of C7 on T1, facet mediated. Other neck: Bilateral thyroid nodules, some of which are calcified. Largest nodule measures 1.3 cm on the right, below the threshold for recommendation of routine ultrasound follow-up. Upper chest: Mild mosaic attenuation in the right upper lobe, possibly air trapping. Review of the MIP images confirms the above findings  CTA HEAD FINDINGS Anterior circulation: The internal carotid arteries are patent from skullbase to carotid termini with diffuse siphon atherosclerosis. There is mild bilateral supraclinoid stenosis. The right MCA is occluded at the mid to distal M1 level without significant branch vessel reconstitution. ACAs and left MCA are patent without evidence of major branch occlusion or significant proximal stenosis. The left A1 segment is severely hypoplastic, with the left A2 predominantly supplied from the right via the anterior communicating artery. No aneurysm. Posterior circulation: The intracranial vertebral arteries are patent with mild-to-moderate calcified plaque but no significant stenosis. PICA and SCA origins are patent. Basilar artery is widely patent. Posterior communicating arteries are diminutive or absent. PCAs are patent with mild atherosclerotic irregularity bilaterally but no significant proximal stenosis. No aneurysm. Venous sinuses: Not adequately assessed due to arterial phase contrast timing. Anatomic variants: Hypoplastic left A1. Review of the MIP images confirms the above findings IMPRESSION: 1. Right M1 occlusion. 2. Intracranial atherosclerosis with mild bilateral ICA stenosis. 3. Atherosclerosis about the right greater than left carotid bifurcations in the neck without significant stenosis. These results were called by telephone at the time of interpretation on 12/17/2016 at 9:15 pm to Dr. Roland Rack , who verbally acknowledged these results. Electronically Signed   By: Logan Bores M.D.   On: 12/10/2016 21:27   Mr Brain Wo Contrast  Result Date: 12/13/2016 CLINICAL DATA:  Left-sided weakness. Right M1 occlusion status post thrombectomy. EXAM: MRI HEAD WITHOUT CONTRAST TECHNIQUE: Multiplanar, multiecho pulse sequences of the brain and surrounding structures were obtained without intravenous contrast. COMPARISON:  Head CT 12/13/2016 FINDINGS: Brain: There is a large acute  infarct  involving nearly the entirety of the right MCA territory. The basal ganglia are largely spared aside from a small infarct in the right caudate. There is extensive confluent petechial type hemorrhage throughout the infarct with some areas of more discrete hematoma formation in the right temporoparietal and insular regions measuring up to approximately 3.5 cm in size. There is extensive associated cytotoxic edema. The right lateral ventricle is largely effaced, and there is 4 mm of leftward midline shift. The left lateral ventricle is unchanged in size from the CT of the time of presentation. There are small chronic left cerebellar and right occipital infarcts. Patchy T2 hyperintensities in the left cerebral hemispheric white matter are compatible with moderate chronic small vessel ischemic disease. There is moderate cerebral atrophy. A mega cisterna magna is incidentally noted. Vascular: Major intracranial vascular flow voids are preserved. Skull and upper cervical spine: Unremarkable bone marrow signal. Sinuses/Orbits: Prior bilateral cataract extraction. No significant sinus scratched a trace left mastoid effusion. No significant paranasal sinus disease. Other: None. IMPRESSION: 1. Large acute infarct involving nearly the entirety of the right MCA territory. Extensive cytotoxic edema with 4 mm midline shift. Extensive confluent petechial type hemorrhage with areas of more discrete hematoma formation in in the right insula and temporoparietal regions. 2. Moderate chronic small vessel ischemic disease. These results will be called to the ordering clinician or representative by the Radiologist Assistant, and communication documented in the PACS or zVision Dashboard. Electronically Signed   By: Logan Bores M.D.   On: 12/13/2016 16:33   Ir US Guide Vasc Access Right  Result Date: 12/13/2016 INDICATION: 81 year old female with emergent large vessel occlusion, involving right MCA territory. Last known well 7:30 p.m.  December 12, 2016. Baseline MRS of 2 with good function. Candidate for tPA and she received tPA. CT aspects score of 10 EXAM: IR PERCUTANEOUS ART THORMBECTOMY/INFUSION INTRACRANIAL INCLUDE DIAG ANGIO; IR ANGIO INTRA EXTRACRAN SEL COM CAROTID INNOMINATE UNI LEFT MOD SED; IR ULTRASOUND GUIDANCE VASC ACCESS RIGHT COMPARISON:  CT 12/15/2016 MEDICATIONS: 2.0 g Ancef. The antibiotic was administered within 1 hour of the procedure ANESTHESIA/SEDATION: General endotracheal with anesthesia team CONTRAST:  200 cc contrast FLUOROSCOPY TIME:  Fluoroscopy Time: 43 minutes 18 seconds (1,247 mGy). COMPLICATIONS: None TECHNIQUE: Informed written consent was obtained from the patient's family after a thorough discussion of the procedural risks, benefits and alternatives. Specific risks discussed include: Bleeding, infection, contrast reaction, kidney injury/failure, need for further procedure/surgery, arterial injury or dissection, embolization to new territory, intracranial hemorrhage (10-15% risk), neurologic deterioration, cardiopulmonary collapse, death. All questions were addressed. Maximal Sterile Barrier Technique was utilized including during the procedure including caps, mask, sterile gowns, sterile gloves, sterile drape, hand hygiene and skin antiseptic. A timeout was performed prior to the initiation of the procedure. Ultrasound survey of the right inguinal region was performed with images stored and sent to PACs. 11 blade scalpel was used to make a small incision. A micropuncture needle was used access the right common femoral artery under ultrasound. With excellent arterial blood flow returned, an .018 micro wire was passed through the needle, observed to enter the abdominal aorta under fluoroscopy. The needle was removed, and a micropuncture sheath was placed over the wire. The inner dilator and wire were removed, and an 035 Bentson wire was advanced under fluoroscopy into the abdominal aorta. The sheath was removed and  a standard 5 [place] vascular sheath was placed. The dilator was removed and the sheath was flushed. Ultrasound survey of the left inguinal region  was performed with images stored and sent to PACs. A micropuncture needle was used access the left common femoral artery under ultrasound. With excellent arterial blood flow returned, and an .018 micro wire was passed through the needle, observed enter the abdominal aorta under fluoroscopy. The needle was removed, and a micropuncture sheath was placed over the wire. The inner dilator and wire were removed, and an 035 Bentson wire was advanced under fluoroscopy into the abdominal aorta. The sheath was removed and a standard 4 [place] vascular sheath was placed. The dilator was removed and the sheath was flushed. This sheath was used for arterial line. A 47F JB-1 diagnostic catheter was advanced over the wire to the proximal descending thoracic aorta. Wire was then removed. Double flush of the catheter was performed. Catheter was then used to select the innominate artery. Angiogram was performed. Using roadmap technique, the catheter was advanced over a standard Glidewire into right common carotid artery. Formal angiogram was performed. Exchange length Rosen wire was then passed through the diagnostic catheter to the distal common carotid artery and the diagnostic catheter was removed. The 5 French sheath was removed and exchanged for 8 French 55 centimeter BrightTip sheath. Sheath was flushed and attached to pressurized and heparinized saline bag for constant forward flow. Then an 8 [place], 95 cm Flowgate balloon tip catheter was prepared on the back table with inflation of the balloon with 50/50 concentration of dilute contrast. The balloon catheter was then advanced over the wire, positioned into the distal common carotid artery. Copious back flush was performed and the balloon catheter was attached to heparinized and pressurized saline bag for forward flow. Flowgate  balloon catheter was then advanced over the Rosen wire into the distal internal carotid artery. Angiogram was performed for road map guide. The number aspiration system was then introduced through the balloon guide catheter, using a synchro soft 014 wire and a coaxial system of a 3 max catheter through Ace 68 catheter. Coaxial system was advanced into the intracranial segment of the right ICA. Given the extreme tortuosity, a coaxial system would not reached the face of the clot. An exchange was required using Rosen wire through the 3 max catheter. The balloon catheter, a 68, and the 3 max catheter were removed. A neuron max 80cm catheter was then advanced over the Rosen wire through the 8 French sheath, positioned in the distal common carotid artery. The coaxial 3 max and Ace 68 were then advanced over the synchro soft wire to the intracranial segment. Moderate atherosclerotic changes of the cavernous segment of the right ICA precluded advancement of the a 68 catheter over the 3 max catheter to the face of the thrombus. Only the 3 max catheter could be advanced to the face of the thrombus. Direct aspiration (ADAPT technique) was then applied with the 3 max catheter. After aspiration, a control angiogram was performed through the Ace 68. A second pass with the 3 max catheter was performed. Control angiogram was performed. Three max catheter was then removed from the Ace 68, and Trevo Provue was advanced through the a 68 to the intracranial segment with the synchro soft micro wire. Microcatheter system was advanced into the internal carotid artery, to the level of the occlusion. The micro wire was then carefully advanced through the occluded segment. Microcatheter was then push through the occluded segment and the wire was removed. Blood was then aspirated through the hub of the microcatheter, and a gentle contrast injection was performed confirming intraluminal position. A  rotating hemostatic valve was then attached  to the back end of the microcatheter, and a pressurized and heparinized saline bag was attached to the catheter. 4 x 40 solitaire device was then selected. Back flush was achieved at the rotating hemostatic valve, and then the device was gently advanced through the microcatheter to the distal end. The retriever was then unsheathed by withdrawing the microcatheter under fluoroscopy. Once the retriever was completely unsheathed, control angiogram was performed from the Ace 68 SOLUMBRA technique was then used with constant aspiration performed at the tip of the Ace 68 catheter as the retriever was gently and slowly withdrawn with fluoroscopic observation. Once the retriever was entirely removed from the system, free aspiration was confirmed at the hub of the intermediate catheter, with free blood return confirmed. The rotating hemostatic valve was reattached, and a control angiogram was performed. The neuro on max catheter and the Ace 68 catheter were removed. JB 1 catheter was used to select the left common carotid artery. Angiogram was performed of the left common carotid artery. Control angiogram was performed at the right common femoral artery puncture site. The 55 centimeter 8 French sheath was exchanged for a standard 9 French sheath at the common femoral artery puncture site. Patient tolerated the procedure well and remained hemodynamically stable throughout. No complications were encountered and no significant blood loss encountered. FINDINGS: Type 3 arch with significant tortuosity of the branch vessels. Right common carotid artery: Tortuous common carotid artery. Moderate calcified atherosclerotic plaque at the bifurcation. Right external carotid artery: Patent with antegrade flow. Right internal carotid artery: Significant tortuosity of the right cervical internal carotid artery. The cervical segment is patent. Horizontal and vertical petrous segment patent. Patent cavernous segment with moderate  atherosclerotic changes. Ophthalmic artery originates from typical location of the ICA. Occlusion of the proximal M1 segment of the right MCA. Patent anterior communicating artery with perfusion of the left ACA territory via the right ICA. Right ACA patent. Right MCA: Before aspiration and mechanical thrombectomy, there is no flow through the M1 segment. After the first pass with direct aspiration, there was TICI 2a flow restored AFter SOLUMBRA technique, TICI 3 flow was restored. Left common carotid artery with mild atherosclerotic changes. Significant tortuosity of the left common carotid artery. No significant stenosis at the origin of the cervical segment of the left ICA. The left intracranial segment is widely patent. With perfusion of the left frontal and parietal region. No significant for perfusion of the left temporal segment. Adequate puncture site at the right common femoral artery with no restriction of flow with the 8 French sheath. IMPRESSION: Status post right cerebral angiogram and mechanical thrombectomy of acute right MCA occlusion, with complete restoration of flow, TICI-3. Signed, Dulcy Fanny. Earleen Newport, DO [facility]iology Electronically Signed   By: Corrie Mckusick D.O.   On: 12/13/2016 00:49   Dg Chest Portable 1 View  Result Date: 12/14/2016 CLINICAL DATA:  Respiratory failure. EXAM: PORTABLE CHEST 1 VIEW COMPARISON:  No recent prior . FINDINGS: Endotracheal tube, right PICC line stable position. Prior CABG. Heart size stable. Mild developing infiltrate right upper lobe cannot be excluded. Low lung volumes with mild basilar atelectasis. No pleural effusion or pneumothorax. IMPRESSION: 1. Lines and tubes in stable position. 2. Prior CABG.  Heart size stable. 3. Mild developing infiltrate right upper lobe cannot be excluded. Low lung volumes with mild basilar atelectasis. Electronically Signed   By: Marcello Moores  Register   On: 12/14/2016 07:27  Portable Chest Xray  Result Date: 12/13/2016 CLINICAL DATA:  81 y/o  F; respiratory failure. EXAM: PORTABLE CHEST 1 VIEW COMPARISON:  None. FINDINGS: Pulmonary venous hypertension. Streaky opacities at lung bases probably represent atelectasis. Normal cardiac silhouette. Endotracheal tube is 3.7 cm from carina. Post median sternotomy. No pleural effusion. Bones are unremarkable. IMPRESSION: Pulmonary venous hypertension. Bibasilar atelectasis. No focal consolidation. Endotracheal tube in satisfactory position. Electronically Signed   By: Kristine Garbe M.D.   On: 12/13/2016 01:51   Ir Percutaneous Art Thrombectomy/infusion Intracranial Inc Diag Angio  Result Date: 12/13/2016 INDICATION: 81 year old female with emergent large vessel occlusion, involving right MCA territory. Last known well 7:30 p.m. December 12, 2016. Baseline MRS of 2 with good function. Candidate for tPA and she received tPA. CT aspects score of 10 EXAM: IR PERCUTANEOUS ART THORMBECTOMY/INFUSION INTRACRANIAL INCLUDE DIAG ANGIO; IR ANGIO INTRA EXTRACRAN SEL COM CAROTID INNOMINATE UNI LEFT MOD SED; IR ULTRASOUND GUIDANCE VASC ACCESS RIGHT COMPARISON:  CT 12/08/2016 MEDICATIONS: 2.0 g Ancef. The antibiotic was administered within 1 hour of the procedure ANESTHESIA/SEDATION: General endotracheal with anesthesia team CONTRAST:  200 cc contrast FLUOROSCOPY TIME:  Fluoroscopy Time: 43 minutes 18 seconds (1,247 mGy). COMPLICATIONS: None TECHNIQUE: Informed written consent was obtained from the patient's family after a thorough discussion of the procedural risks, benefits and alternatives. Specific risks discussed include: Bleeding, infection, contrast reaction, kidney injury/failure, need for further procedure/surgery, arterial injury or dissection, embolization to new territory, intracranial hemorrhage (10-15% risk), neurologic deterioration, cardiopulmonary collapse, death. All questions were addressed. Maximal Sterile Barrier  Technique was utilized including during the procedure including caps, mask, sterile gowns, sterile gloves, sterile drape, hand hygiene and skin antiseptic. A timeout was performed prior to the initiation of the procedure. Ultrasound survey of the right inguinal region was performed with images stored and sent to PACs. 11 blade scalpel was used to make a small incision. A micropuncture needle was used access the right common femoral artery under ultrasound. With excellent arterial blood flow returned, an .018 micro wire was passed through the needle, observed to enter the abdominal aorta under fluoroscopy. The needle was removed, and a micropuncture sheath was placed over the wire. The inner dilator and wire were removed, and an 035 Bentson wire was advanced under fluoroscopy into the abdominal aorta. The sheath was removed and a standard 5 [place] vascular sheath was placed. The dilator was removed and the sheath was flushed. Ultrasound survey of the left inguinal region was performed with images stored and sent to PACs. A micropuncture needle was used access the left common femoral artery under ultrasound. With excellent arterial blood flow returned, and an .018 micro wire was passed through the needle, observed enter the abdominal aorta under fluoroscopy. The needle was removed, and a micropuncture sheath was placed over the wire. The inner dilator and wire were removed, and an 035 Bentson wire was advanced under fluoroscopy into the abdominal aorta. The sheath was removed and a standard 4 [place] vascular sheath was placed. The dilator was removed and the sheath was flushed. This sheath was used for arterial line. A 21F JB-1 diagnostic catheter was advanced over the wire to the proximal descending thoracic aorta. Wire was then removed. Double flush of the catheter was performed. Catheter was then used to select the innominate artery. Angiogram was performed. Using roadmap technique, the catheter was advanced over a  standard Glidewire into right common carotid artery. Formal angiogram was performed. Exchange length Rosen wire was then passed through the diagnostic catheter to  the distal common carotid artery and the diagnostic catheter was removed. The 5 French sheath was removed and exchanged for 8 French 55 centimeter BrightTip sheath. Sheath was flushed and attached to pressurized and heparinized saline bag for constant forward flow. Then an 8 [place], 95 cm Flowgate balloon tip catheter was prepared on the back table with inflation of the balloon with 50/50 concentration of dilute contrast. The balloon catheter was then advanced over the wire, positioned into the distal common carotid artery. Copious back flush was performed and the balloon catheter was attached to heparinized and pressurized saline bag for forward flow. Flowgate balloon catheter was then advanced over the Rosen wire into the distal internal carotid artery. Angiogram was performed for road map guide. The number aspiration system was then introduced through the balloon guide catheter, using a synchro soft 014 wire and a coaxial system of a 3 max catheter through Ace 68 catheter. Coaxial system was advanced into the intracranial segment of the right ICA. Given the extreme tortuosity, a coaxial system would not reached the face of the clot. An exchange was required using Rosen wire through the 3 max catheter. The balloon catheter, a 68, and the 3 max catheter were removed. A neuron max 80cm catheter was then advanced over the Rosen wire through the 8 French sheath, positioned in the distal common carotid artery. The coaxial 3 max and Ace 68 were then advanced over the synchro soft wire to the intracranial segment. Moderate atherosclerotic changes of the cavernous segment of the right ICA precluded advancement of the a 68 catheter over the 3 max catheter to the face of the thrombus. Only the 3 max catheter could be advanced to the face of the thrombus. Direct  aspiration (ADAPT technique) was then applied with the 3 max catheter. After aspiration, a control angiogram was performed through the Ace 68. A second pass with the 3 max catheter was performed. Control angiogram was performed. Three max catheter was then removed from the Ace 68, and Trevo Provue was advanced through the a 68 to the intracranial segment with the synchro soft micro wire. Microcatheter system was advanced into the internal carotid artery, to the level of the occlusion. The micro wire was then carefully advanced through the occluded segment. Microcatheter was then push through the occluded segment and the wire was removed. Blood was then aspirated through the hub of the microcatheter, and a gentle contrast injection was performed confirming intraluminal position. A rotating hemostatic valve was then attached to the back end of the microcatheter, and a pressurized and heparinized saline bag was attached to the catheter. 4 x 40 solitaire device was then selected. Back flush was achieved at the rotating hemostatic valve, and then the device was gently advanced through the microcatheter to the distal end. The retriever was then unsheathed by withdrawing the microcatheter under fluoroscopy. Once the retriever was completely unsheathed, control angiogram was performed from the Ace 68 SOLUMBRA technique was then used with constant aspiration performed at the tip of the Ace 68 catheter as the retriever was gently and slowly withdrawn with fluoroscopic observation. Once the retriever was entirely removed from the system, free aspiration was confirmed at the hub of the intermediate catheter, with free blood return confirmed. The rotating hemostatic valve was reattached, and a control angiogram was performed. The neuro on max catheter and the Ace 68 catheter were removed. JB 1 catheter was used to select the left common carotid artery. Angiogram was performed of the left common carotid  artery. Control angiogram  was performed at the right common femoral artery puncture site. The 55 centimeter 8 French sheath was exchanged for a standard 9 French sheath at the common femoral artery puncture site. Patient tolerated the procedure well and remained hemodynamically stable throughout. No complications were encountered and no significant blood loss encountered. FINDINGS: Type 3 arch with significant tortuosity of the branch vessels. Right common carotid artery: Tortuous common carotid artery. Moderate calcified atherosclerotic plaque at the bifurcation. Right external carotid artery: Patent with antegrade flow. Right internal carotid artery: Significant tortuosity of the right cervical internal carotid artery. The cervical segment is patent. Horizontal and vertical petrous segment patent. Patent cavernous segment with moderate atherosclerotic changes. Ophthalmic artery originates from typical location of the ICA. Occlusion of the proximal M1 segment of the right MCA. Patent anterior communicating artery with perfusion of the left ACA territory via the right ICA. Right ACA patent. Right MCA: Before aspiration and mechanical thrombectomy, there is no flow through the M1 segment. After the first pass with direct aspiration, there was TICI 2a flow restored AFter SOLUMBRA technique, TICI 3 flow was restored. Left common carotid artery with mild atherosclerotic changes. Significant tortuosity of the left common carotid artery. No significant stenosis at the origin of the cervical segment of the left ICA. The left intracranial segment is widely patent. With perfusion of the left frontal and parietal region. No significant for perfusion of the left temporal segment. Adequate puncture site at the right common femoral artery with no restriction of flow with the 8 French sheath. IMPRESSION: Status post right cerebral angiogram and mechanical thrombectomy of acute right MCA occlusion, with complete restoration of flow, TICI-3. Signed, Dulcy Fanny. Earleen Newport, DO [facility] Electronically Signed   By: Corrie Mckusick D.O.   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 Electronically Signed   By: Logan Bores M.D.   On: 11/29/2016 20:57   Result Date: 11/30/2016 CLINICAL DATA:  Code stroke.  Left-sided weakness. EXAM: CT HEAD WITHOUT CONTRAST TECHNIQUE: Contiguous axial images were obtained from the base of the skull through the vertex without intravenous contrast. COMPARISON:  None. FINDINGS: Brain: A right occipital lobe infarct is present and is favored to be chronic. There is a small chronic appearing left cerebellar infarct. Elsewhere, there is no evidence of acute cortical infarct, intracranial hemorrhage, mass, midline shift, or extra-axial fluid collection. There is moderate cerebral atrophy. Cerebral white matter hypodensities are nonspecific but compatible with moderate chronic small vessel ischemic disease. A mega cisterna magna is incidentally noted. Vascular: Calcified atherosclerosis at the skullbase. Dense right MCA. Skull: No fracture or focal osseous lesion. Sinuses/Orbits: The visualized paranasal sinuses and mastoid air cells are clear. Prior bilateral cataract extraction is noted. Other: None. ASPECTS [facility] Stroke Program Early CT Score) - Ganglionic level infarction (caudate, lentiform nuclei, internal capsule, insula, M1-M3 cortex): 7 - Supraganglionic infarction (M4-M6 cortex): 3 Total score (0-10 with 10 being normal): 10 IMPRESSION: 1. No acute intracranial hemorrhage or definite acute infarct identified. 2. ASPECTS is 10. 3. Dense right MCA concerning for acute thrombus. 4. Moderate chronic small vessel ischemic disease and cerebral atrophy. These results were called by telephone at the time of interpretation on 12/11/2016 at  8:28 pm to Dr. Leonel Ramsay, who verbally acknowledged these results. Electronically Signed: By: Logan Bores M.D. On: 12/06/2016  20:33   Ir Angio Intra Extracran Sel Com Carotid Innominate Uni L Mod Sed  Result Date: 12/13/2016 INDICATION: 81 year old female with emergent large vessel occlusion, involving right MCA territory. Last known well 7:30 p.m. December 12, 2016. Baseline MRS of 2 with good function. Candidate for tPA and she received tPA. CT aspects score of 10 EXAM: IR PERCUTANEOUS ART THORMBECTOMY/INFUSION INTRACRANIAL INCLUDE DIAG ANGIO; IR ANGIO INTRA EXTRACRAN SEL COM CAROTID INNOMINATE UNI LEFT MOD SED; IR ULTRASOUND GUIDANCE VASC ACCESS RIGHT COMPARISON:  CT 11/27/2016 MEDICATIONS: 2.0 g Ancef. The antibiotic was administered within 1 hour of the procedure ANESTHESIA/SEDATION: General endotracheal with anesthesia team CONTRAST:  200 cc contrast FLUOROSCOPY TIME:  Fluoroscopy Time: 43 minutes 18 seconds (1,247 mGy). COMPLICATIONS: None TECHNIQUE: Informed written consent was obtained from the patient's family after a thorough discussion of the procedural risks, benefits and alternatives. Specific risks discussed include: Bleeding, infection, contrast reaction, kidney injury/failure, need for further procedure/surgery, arterial injury or dissection, embolization to new territory, intracranial hemorrhage (10-15% risk), neurologic deterioration, cardiopulmonary collapse, death. All questions were addressed. Maximal Sterile Barrier Technique was utilized including during the procedure including caps, mask, sterile gowns, sterile gloves, sterile drape, hand hygiene and skin antiseptic. A timeout was performed prior to the initiation of the procedure. Ultrasound survey of the right inguinal region was performed with images stored and sent to PACs. 11 blade scalpel was used to make a small incision. A micropuncture needle was used access the right common femoral artery under ultrasound. With excellent  arterial blood flow returned, an .018 micro wire was passed through the needle, observed to enter the abdominal aorta under fluoroscopy. The needle was removed, and a micropuncture sheath was placed over the wire. The inner dilator and wire were removed, and an 035 Bentson wire was advanced under fluoroscopy into the abdominal aorta. The sheath was removed and a standard 5 [place] vascular sheath was placed. The dilator was removed and the sheath was flushed. Ultrasound survey of the left inguinal region was performed with images stored and sent to PACs. A micropuncture needle was used access the left common femoral artery under ultrasound. With excellent arterial blood flow returned, and an .018 micro wire was passed through the needle, observed enter the abdominal aorta under fluoroscopy. The needle was removed, and a micropuncture sheath was placed over the wire. The inner dilator and wire were removed, and an 035 Bentson wire was advanced under fluoroscopy into the abdominal aorta. The sheath was removed and a standard 4 [place] vascular sheath was placed. The dilator was removed and the sheath was flushed. This sheath was used for arterial line. A 49F JB-1 diagnostic catheter was advanced over the wire to the proximal descending thoracic aorta. Wire was then removed. Double flush of the catheter was performed. Catheter was then used to select the innominate artery. Angiogram was performed. Using roadmap technique, the catheter was advanced over a standard Glidewire into right common carotid artery. Formal angiogram was performed. Exchange length Rosen wire was then passed through the diagnostic catheter to the distal common carotid artery and the diagnostic catheter was removed. The 5 French sheath was removed and exchanged for 8 French 55 centimeter BrightTip sheath. Sheath was flushed and attached to pressurized and heparinized saline bag for constant forward flow. Then an 8 [place], 95 cm Flowgate balloon tip  catheter was prepared on the back table with inflation of the balloon with 50/50 concentration of dilute contrast. The balloon catheter was then advanced over the wire, positioned  into the distal common carotid artery. Copious back flush was performed and the balloon catheter was attached to heparinized and pressurized saline bag for forward flow. Flowgate balloon catheter was then advanced over the Rosen wire into the distal internal carotid artery. Angiogram was performed for road map guide. The number aspiration system was then introduced through the balloon guide catheter, using a synchro soft 014 wire and a coaxial system of a 3 max catheter through Ace 68 catheter. Coaxial system was advanced into the intracranial segment of the right ICA. Given the extreme tortuosity, a coaxial system would not reached the face of the clot. An exchange was required using Rosen wire through the 3 max catheter. The balloon catheter, a 68, and the 3 max catheter were removed. A neuron max 80cm catheter was then advanced over the Rosen wire through the 8 French sheath, positioned in the distal common carotid artery. The coaxial 3 max and Ace 68 were then advanced over the synchro soft wire to the intracranial segment. Moderate atherosclerotic changes of the cavernous segment of the right ICA precluded advancement of the a 68 catheter over the 3 max catheter to the face of the thrombus. Only the 3 max catheter could be advanced to the face of the thrombus. Direct aspiration (ADAPT technique) was then applied with the 3 max catheter. After aspiration, a control angiogram was performed through the Ace 68. A second pass with the 3 max catheter was performed. Control angiogram was performed. Three max catheter was then removed from the Ace 68, and Trevo Provue was advanced through the a 68 to the intracranial segment with the synchro soft micro wire. Microcatheter system was advanced into the internal carotid artery, to the level of  the occlusion. The micro wire was then carefully advanced through the occluded segment. Microcatheter was then push through the occluded segment and the wire was removed. Blood was then aspirated through the hub of the microcatheter, and a gentle contrast injection was performed confirming intraluminal position. A rotating hemostatic valve was then attached to the back end of the microcatheter, and a pressurized and heparinized saline bag was attached to the catheter. 4 x 40 solitaire device was then selected. Back flush was achieved at the rotating hemostatic valve, and then the device was gently advanced through the microcatheter to the distal end. The retriever was then unsheathed by withdrawing the microcatheter under fluoroscopy. Once the retriever was completely unsheathed, control angiogram was performed from the Ace 68 SOLUMBRA technique was then used with constant aspiration performed at the tip of the Ace 68 catheter as the retriever was gently and slowly withdrawn with fluoroscopic observation. Once the retriever was entirely removed from the system, free aspiration was confirmed at the hub of the intermediate catheter, with free blood return confirmed. The rotating hemostatic valve was reattached, and a control angiogram was performed. The neuro on max catheter and the Ace 68 catheter were removed. JB 1 catheter was used to select the left common carotid artery. Angiogram was performed of the left common carotid artery. Control angiogram was performed at the right common femoral artery puncture site. The 55 centimeter 8 French sheath was exchanged for a standard 9 French sheath at the common femoral artery puncture site. Patient tolerated the procedure well and remained hemodynamically stable throughout. No complications were encountered and no significant blood loss encountered. FINDINGS: Type 3 arch with significant tortuosity of the branch vessels. Right common carotid artery: Tortuous common carotid  artery. Moderate calcified atherosclerotic plaque  at the bifurcation. Right external carotid artery: Patent with antegrade flow. Right internal carotid artery: Significant tortuosity of the right cervical internal carotid artery. The cervical segment is patent. Horizontal and vertical petrous segment patent. Patent cavernous segment with moderate atherosclerotic changes. Ophthalmic artery originates from typical location of the ICA. Occlusion of the proximal M1 segment of the right MCA. Patent anterior communicating artery with perfusion of the left ACA territory via the right ICA. Right ACA patent. Right MCA: Before aspiration and mechanical thrombectomy, there is no flow through the M1 segment. After the first pass with direct aspiration, there was TICI 2a flow restored AFter SOLUMBRA technique, TICI 3 flow was restored. Left common carotid artery with mild atherosclerotic changes. Significant tortuosity of the left common carotid artery. No significant stenosis at the origin of the cervical segment of the left ICA. The left intracranial segment is widely patent. With perfusion of the left frontal and parietal region. No significant for perfusion of the left temporal segment. Adequate puncture site at the right common femoral artery with no restriction of flow with the 8 French sheath. IMPRESSION: Status post right cerebral angiogram and mechanical thrombectomy of acute right MCA occlusion, with complete restoration of flow, TICI-3. Signed, Dulcy Fanny. Earleen Newport, DO [facility] Electronically Signed   By: Corrie Mckusick D.O.   On: 12/13/2016 00:49    Labs:  CBC:  Recent Labs  12/08/2016 2023 12/11/2016 2100 12/14/16 0523  WBC  --  8.4 17.5*  HGB 10.5* 9.1* 7.0*  HCT 31.0* 29.0* 22.2*  PLT  --  433* 437*    COAGS:  Recent Labs  12/05/2016 2100  INR 1.04  APTT 35    BMP:  Recent Labs  11/29/2016 2023 11/25/2016 2100 12/13/16 1745  12/13/16 2330 12/14/16 0523  NA 130* 133* 137 157* 145  K 6.3* 5.0  --   --  4.3  CL 97* 99*  --   --  115*  CO2  --  22  --   --  21*  GLUCOSE 413* 399*  --   --  123*  BUN 41* 28*  --   --  31*  CALCIUM  --  8.5*  --   --  8.3*  CREATININE 1.80* 1.83*  --   --  2.09*  GFRNONAA  --  25*  --   --  22*  GFRAA  --  29*  --   --  25*    LIVER FUNCTION TESTS:  Recent Labs  11/26/2016 2100  BILITOT 0.3  AST 19  ALT 13*  ALKPHOS 85  PROT 6.1*  ALBUMIN 2.6*    Assessment and Plan:  CVA R MCA revasc 12/03/2016 pm On vent; no response Will follow--- Plan per Stroke team  Electronically Signed: Emerita Berkemeier A 12/14/2016, 10:26 AM   I spent a total of 15 Minutes at the the patient's bedside AND on the patient's hospital floor or unit, greater than 50% of which was counseling/coordinating care for CVA; R MCA revasc

## 2016-12-14 NOTE — Progress Notes (Addendum)
PULMONARY / CRITICAL CARE MEDICINE   Name: Julia Madden MRN: [id number] DOB: 1936/05/11    ADMISSION DATE:  12/15/2016 CONSULTATION DATE:  12/13/2016  REFERRING MD :  Dr. Leonel Ramsay  CHIEF COMPLAINT:  Right CVA  HISTORY OF PRESENT ILLNESS:    81 y.o. female with a history of DM-2, CKD,  CABG x4 in 2010, Afib (on plavex only, no anticoag), previous stroke with mild deficits who presents with acute onset left-sided weakness.  She is currently intubated and unable to provide history.  She was last known normal at 7:30PM on 12/06/2016. She was found to have Right MCA occlusion, received IV TPA and IR achieved Right MCA revascularization with mechanical thrombectomy.   SUBJECTIVE:  No acute events overnight.   VITAL SIGNS: Temp:  [97.2 F (36.2 C)-98.8 F (37.1 C)] 97.2 F (36.2 C) (01/22 0800) Pulse Rate:  [63-92] 70 (01/22 0600) Resp:  [10-22] 14 (01/22 0600) BP: (105-148)/(45-73) 148/65 (01/22 0600) SpO2:  [99 %-100 %] 100 % (01/22 0600) Arterial Line BP: (134-160)/(40-61) 153/57 (01/21 1400) FiO2 (%):  [30 %-40 %] 30 % (01/22 0600) HEMODYNAMICS:   VENTILATOR SETTINGS: Vent Mode: PRVC FiO2 (%):  [30 %-40 %] 30 % Set Rate:  [15 bmp] 15 bmp Vt Set:  [500 mL] 500 mL PEEP:  [5 cmH20] 5 cmH20 Pressure Support:  [5 cmH20] 5 cmH20 Plateau Pressure:  [13 cmH20-17 cmH20] 15 cmH20 INTAKE / OUTPUT:  Intake/Output Summary (Last 24 hours) at 12/14/16 0913 Last data filed at 12/14/16 0500  Gross per 24 hour  Intake           552.92 ml  Output              850 ml  Net          -297.08 ml    PHYSICAL EXAMINATION: General:  Elderly female in NAD on vent Neuro:  Resting comfortably, arouses to pain and follows on R.  HEENT:  ETT Cardiovascular:  RRR, 3/6 SEM Lungs: clear unlabored Abdomen:  Soft non tender Musculoskeletal:  Warm and dry, no sig edema   LABS:  CBC  Recent Labs Lab 12/14/2016 2023 12/01/2016 2100 12/14/16 0523  WBC  --  8.4 17.5*  HGB 10.5* 9.1* 7.0*   HCT 31.0* 29.0* 22.2*  PLT  --  433* 437*   Coag's  Recent Labs Lab 12/22/2016 2100  APTT 35  INR 1.04   BMET  Recent Labs Lab 11/30/2016 2023 11/27/2016 2100 12/13/16 1745 12/13/16 2330 12/14/16 0523  NA 130* 133* 137 157* 145  K 6.3* 5.0  --   --  4.3  CL 97* 99*  --   --  115*  CO2  --  22  --   --  21*  BUN 41* 28*  --   --  31*  CREATININE 1.80* 1.83*  --   --  2.09*  GLUCOSE 413* 399*  --   --  123*   Electrolytes  Recent Labs Lab 12/14/2016 2100 12/14/16 0523  CALCIUM 8.5* 8.3*  MG  --  1.9  PHOS  --  5.2*   Sepsis Markers No results for input(s): LATICACIDVEN, PROCALCITON, O2SATVEN in the last 168 hours. ABG  Recent Labs Lab 12/13/16 0240  PHART 7.362  PCO2ART 35.1  PO2ART 166*   Liver Enzymes  Recent Labs Lab 11/23/2016 2100  AST 19  ALT 13*  ALKPHOS 85  BILITOT 0.3  ALBUMIN 2.6*   Cardiac Enzymes No results for input(s): TROPONINI, PROBNP in the  last 168 hours. Glucose  Recent Labs Lab 12/13/16 1304 12/13/16 1639 12/13/16 1939 12/13/16 2341 12/14/16 0400 12/14/16 0815  GLUCAP 169* 90 93 132* 108* 152*    Imaging Mr Brain Wo Contrast  Result Date: 12/13/2016 CLINICAL DATA:  Left-sided weakness. Right M1 occlusion status post thrombectomy. EXAM: MRI HEAD WITHOUT CONTRAST TECHNIQUE: Multiplanar, multiecho pulse sequences of the brain and surrounding structures were obtained without intravenous contrast. COMPARISON:  Head CT 12/13/2016 FINDINGS: Brain: There is a large acute infarct involving nearly the entirety of the right MCA territory. The basal ganglia are largely spared aside from a small infarct in the right caudate. There is extensive confluent petechial type hemorrhage throughout the infarct with some areas of more discrete hematoma formation in the right temporoparietal and insular regions measuring up to approximately 3.5 cm in size. There is extensive associated cytotoxic edema. The right lateral ventricle is largely effaced, and  there is 4 mm of leftward midline shift. The left lateral ventricle is unchanged in size from the CT of the time of presentation. There are small chronic left cerebellar and right occipital infarcts. Patchy T2 hyperintensities in the left cerebral hemispheric white matter are compatible with moderate chronic small vessel ischemic disease. There is moderate cerebral atrophy. A mega cisterna magna is incidentally noted. Vascular: Major intracranial vascular flow voids are preserved. Skull and upper cervical spine: Unremarkable bone marrow signal. Sinuses/Orbits: Prior bilateral cataract extraction. No significant sinus scratched a trace left mastoid effusion. No significant paranasal sinus disease. Other: None. IMPRESSION: 1. Large acute infarct involving nearly the entirety of the right MCA territory. Extensive cytotoxic edema with 4 mm midline shift. Extensive confluent petechial type hemorrhage with areas of more discrete hematoma formation in in the right insula and temporoparietal regions. 2. Moderate chronic small vessel ischemic disease. These results will be called to the ordering clinician or representative by the Radiologist Assistant, and communication documented in the PACS or zVision Dashboard. Electronically Signed   By: Logan Bores M.D.   On: 12/13/2016 16:33   Dg Chest Portable 1 View  Result Date: 12/14/2016 CLINICAL DATA:  Respiratory failure. EXAM: PORTABLE CHEST 1 VIEW COMPARISON:  No recent prior . FINDINGS: Endotracheal tube, right PICC line stable position. Prior CABG. Heart size stable. Mild developing infiltrate right upper lobe cannot be excluded. Low lung volumes with mild basilar atelectasis. No pleural effusion or pneumothorax. IMPRESSION: 1. Lines and tubes in stable position. 2. Prior CABG.  Heart size stable. 3. Mild developing infiltrate right upper lobe cannot be excluded. Low lung volumes with mild basilar atelectasis. Electronically Signed   By: Marcello Moores  Register   On:  12/14/2016 07:27   TUBES/LINES:  ETT 1/20>>>   STUDIES:  CTA head 1/20 > Right M1 occlusion. Intracranial atherosclerosis with mild bilateral ICA stenosis. Atherosclerosis about the right greater than left carotid bifurcations in the neck without significant stenosis. MRI 1/21 > Large acute infarct involving nearly the entirety of the right MCA territory. Extensive cytotoxic edema with 4 mm midline shift. Extensive confluent petechial type hemorrhage with areas of more discrete hematoma formation in in the right insula and temporoparietal regions. Moderate chronic small vessel ischemic disease.  MICRO:  ABX:    ASSESSMENT / PLAN: 81yo female with dense R MCA ischemic type CVA s/p IV TPA and mechanical thrombectomy. Remains on ventilator.  NEUROLOGIC A:   Right sided MCA - s/p thrombectomy and systemic TPA (completed at 2236) Sedation needs on vent  P:   RASS goal: 0 Fentanyl infusion  Daily WUA  Stroke team pimary  PULMONARY A: Left intubated for airway protection P:   Full vent support SBT, neuro status borderline for extubation even if passes SBT F/u CXR   CARDIOVASCULAR A:  CAD Afib HTN  P:  Continue cardene gtt for goal SBP 120-146mmHg Low dose IV metoprolol (home med) Holding plavix  Hold home amio for now  RENAL A:   CKD - unclear basline P:   Follow BMP and urine output.  GASTROINTESTINAL A:   No active issue  P:   NPO  PPI  If not able to extubate will need OGT and tube feeds.   HEMATOLOGIC A:   Anemia - likely chronic disease - unknown baseline P:  F/u cbc in PM and again in morning.  Transfuse for hemoglobin < 7 Anemia panel pending   INFECTIOUS A:   Leukocytosis worsening > suspect stress response. Afebrile. P:   Monitor wbc, fever curve off abx   ENDOCRINE A:   DM-2  very difficult to control     P:   Conitnue lantus 12 units daily  SSI    Georgann Housekeeper, AGACNP-BC [facility] Pulmonology/Critical Care Pager [phone] or  [phone]  12/14/2016 9:14 AM

## 2016-12-14 NOTE — Progress Notes (Signed)
PT Cancellation Note  Patient Details Name: Julia Madden MRN: [id number] DOB: 1936-09-01   Cancelled Treatment:    Reason Eval/Treat Not Completed: Patient not medically ready Pt on strict bedrest. Will follow up once activity orders are increased.   [name] [name] 12/14/2016, 8:44 AM [name], PT, DPT [phone]

## 2016-12-14 NOTE — Progress Notes (Signed)
OT Cancellation Note  Patient Details Name: Julia Madden MRN: [id number] DOB: 02-27-1936   Cancelled Treatment:    Reason Eval/Treat Not Completed: Patient not medically ready.  Pt on strict bedrest.  Will reattempt once activity level increased.  [place], OTR/L [id number]   Lucille Passy M 12/14/2016, 10:54 AM

## 2016-12-14 NOTE — Progress Notes (Signed)
1934- During shift assessment pupils noted to be unequal, right pupil 2, round, sluggish, and left 4, round, sluggish. Pt not following commands or moving right side. Fentanyl turned off, on call neurology notified, stat CT scan ordered. Family at bedside, neurological changes explained. All questions answered at this time. Will continue to monitor. Lianne Bushy RN BSN

## 2016-12-14 NOTE — Progress Notes (Addendum)
STROKE TEAM PROGRESS NOTE   HISTORY OF PRESENT ILLNESS (per record) Julia Madden  is a 81 y.o. female with a history of previous stroke with mild deficits who presents with acute onset left-sided weakness. Her previous stroke happened in the setting of cardiac surgery and she improved very quickly, but was debilitated because of the cardiac surgery and therefore did have to rehabilitate following this. Currently she is able to take care of all of her own activities of daily living, going up and down stairs, but does occasionally have an aide that comes and helps with chores.  LKW: 7:30 PM tpa given?: yes Modified Rankin score: 2   SUBJECTIVE (INTERVAL HISTORY) No family members present. The patient remains intubated.Neurologically stable overnight. Patient is able to follow commands. She still has bilateral groin sheaths. Follow-up CT scan postprocedure shows large right MCA infarct. Blood pressure has been adequately controlled. Her blood sugars have been quite high   OBJECTIVE Temp:  [97.2 F (36.2 C)-98.8 F (37.1 C)] 97.6 F (36.4 C) (01/22 1200) Pulse Rate:  [63-88] 65 (01/22 1200) Cardiac Rhythm: Normal sinus rhythm (01/22 1200) Resp:  [10-22] 15 (01/22 1200) BP: (119-149)/(54-73) 149/61 (01/22 1200) SpO2:  [99 %-100 %] 100 % (01/22 1220) Arterial Line BP: (150-153)/(52-57) 153/57 (01/21 1400) FiO2 (%):  [30 %-40 %] 30 % (01/22 1220)  CBC:   Recent Labs Lab 12/21/2016 2100 12/14/16 0523  WBC 8.4 17.5*  NEUTROABS 6.4  --   HGB 9.1* 7.0*  HCT 29.0* 22.2*  MCV 85.8 84.4  PLT 433* 437*    Basic Metabolic Panel:  Recent Labs Lab 11/27/2016 2100  12/13/16 2330 12/14/16 0523  NA 133*  < > 157* 145  K 5.0  --   --  4.3  CL 99*  --   --  115*  CO2 22  --   --  21*  GLUCOSE 399*  --   --  123*  BUN 28*  --   --  31*  CREATININE 1.83*  --   --  2.09*  CALCIUM 8.5*  --   --  8.3*  MG  --   --   --  1.9  PHOS  --   --   --  5.2*  < > = values in this interval not  displayed.  Lipid Panel:     Component Value Date/Time   CHOL 105 12/13/2016 0211   TRIG 138 12/13/2016 0211   HDL 32 (L) 12/13/2016 0211   CHOLHDL 3.3 12/13/2016 0211   VLDL 28 12/13/2016 0211   LDLCALC 45 12/13/2016 0211   HgbA1c:  Lab Results  Component Value Date   HGBA1C 11.7 (H) 12/13/2016   Urine Drug Screen: No results found for: LABOPIA, COCAINSCRNUR, LABBENZ, AMPHETMU, THCU, LABBARB    IMAGING   CT Head Wo Contrast 12/13/2016 Status post right MCA thrombectomy. Swelling in the right MCA territory with slight hyperdensity.  Some of this hyperdensity could be contrast staining, but some could also represent petechial blood products.  The degree of swelling raises concern for the possibility of some cerebral infarction, though again, some edema can be seen following intervention.    Ct Angio Head and Neck  W Or Wo Contrast 12/21/2016 1. Right M1 occlusion.  2. Intracranial atherosclerosis with mild bilateral ICA stenosis.  3. Atherosclerosis about the right greater than left carotid bifurcations in the neck without significant stenosis.    Ir US Guide Vasc Access Right 12/13/2016 Status post right cerebral angiogram and mechanical  thrombectomy of acute right MCA occlusion, with complete restoration of flow, TICI-3.  Signed, Dulcy Fanny. Earleen Newport, DO [facility] Radiology    Portable Chest Xray 12/13/2016 Pulmonary venous hypertension. Bibasilar atelectasis. No focal consolidation. Endotracheal tube in satisfactory position.    Ct Head Code Stroke W/o Cm Addendum Date: 11/24/2016   ADDENDUM REPORT: Chronic appearing left cerebellar and right occipital lobe infarcts.  12/19/2016 1. No acute intracranial hemorrhage or definite acute infarct identified.  2. ASPECTS is 10.  3. Dense right MCA concerning for acute thrombus.  4. Moderate chronic small vessel ischemic disease and cerebral atrophy.   MRI brain   large RMCA  hemorrhagic infarct with cytotoxic edema and 4 mm right to left shift,    PHYSICAL EXAM  Elderly caucasian lady who is intubated. She has bilateral groin arterial sheaths. . Afebrile. Head is nontraumatic. Neck is supple without bruit.    Cardiac exam no murmur or gallop. Lungs are clear to auscultation. Distal pulses are well felt.  Neurological Exam :  Seated intubated.follows only minimal commands intermittently on the left.. Right gaze preference  Fundi not visualized. Dense left hemiplegi 0/5 with hypotonia. withdraws right side to pain. Plantar down going on right and upgoing on the left. ASSESSMENT/PLAN Julia Madden is a 81 y.o. female with history of  previous strokes, hypertension, hyperlipidemia, diabetes mellitus, chronic kidney disease, coronary artery disease, and atrial myxoma excised with PFO closure, presenting with acute onset left-sided weakness. She received IV t-PA Saturday, 12/22/2016 at 2045 with subsequent mechanical thrombectomy of a right M1 occlusion with successful recanalization.  Stroke:  Non-dominant infarct embolic withcytotoxic edema and right to left brain herniation. Etiology ? H/o myxoma s/p resection   Resultant  Left hemiplegia and hemianopia  MRI - pending  MRA - not performed  CTA Head & Neck - Right M1 occlusion.   Carotid Doppler - CTA neck  2D Echo - pending  LDL - 45  HgbA1c - 111.7  VTE prophylaxis - SCDs Diet NPO time specified  aspirin 81 mg daily and clopidogrel 75 mg daily prior to admission, now on No antithrombotic s/p TPA and thrombectomy.  Ongoing aggressive stroke risk factor management  Therapy recommendations:  pending  Disposition: Pending  Hypertension  Blood pressure mildly low - off Cardene  Permissive hypertension (OK if < 220/120) but gradually normalize in 5-7 days  Long-term BP goal normotensive  Hyperlipidemia  Home meds:  Lipitor 20 mg daily not resumed in hospital  LDL 45, goal < 70  Resume  Lipitor when PO access is available.  Continue statin at discharge  Diabetes  HgbA1c 11.5 goal < 7.0  Uncontrolled  Other Stroke Risk Factors  Advanced age  Hx stroke/TIA  Family hx stroke (mother)  Coronary artery disease   Other Active Problems  Hyponatremia - 133  Anemia - 9.1 / 29  Renal insufficiency - 28 / 1.83  PLAN  Repeat CT in am  Hold aspirin due to hemorrhagic transformation  Patient for PICC line placement  Continue hypertonic saline for induced hypernatremia sodium goal 150-155  PCCM to manage ventilator and sugar control  Hospital day # 2   I have personally examined this patient, reviewed notes, independently viewed imaging studies, participated in medical decision making and plan of care.ROS completed by me personally and pertinent positives fully documented  I have made any additions or clarifications directly to the above note. . She presented with large right MCA infarct due to right  M1 occlusion and underwent thrombolysis with IV TPA as well as mechanical embolectomy with good recannulization but follow-up imaging that suggested large infarct.with cytotoxic edema and brain right to left shift. Recommend continue strict blood pressure control and aggressive sugar control . Continue hypertonic saline for cytotoxic cerebral edema with serum sodium goal 150-155. Repeat CT scan of the head tomorrow. Continue ventilatory support for now. Long discussion of the bedside with the patient's sons and answered questions. Discussed with Dr. Ashok Cordia pulmonary critical care medicine This patient is critically ill and at significant risk of neurological worsening, death and care requires constant monitoring of vital signs, hemodynamics,respiratory and cardiac monitoring, extensive review of multiple databases, frequent neurological assessment, discussion with family, other specialists and medical decision making of high complexity.I have made any additions or  clarifications directly to the above note.This critical care time does not reflect procedure time, or teaching time or supervisory time of PA/NP/Med Resident etc but could involve care discussion time.  I spent 35 minutes of neurocritical care time  in the care of  this patient.      Antony Contras, MD Medical Director [facility] St[facility] Pager: [phone] 12/14/2016 1:01 PM     To contact Stroke Continuity provider, please refer to [email or web address] After hours, contact General Neurology

## 2016-12-14 NOTE — Progress Notes (Signed)
  Echocardiogram 2D Echocardiogram has been performed.  Tresa Res 12/14/2016, 11:55 AM

## 2016-12-14 NOTE — Progress Notes (Signed)
SLP Cancellation Note  Patient Details Name: CORI ALEXIE MRN: [id number] DOB: 11/15/1936   Cancelled treatment:       Reason Eval/Treat Not Completed: Patient not medically ready, remains intubated. Per discussion with RN, will s/o for now. Please re-order when ready.   Germain Osgood 12/14/2016, 10:23 AM  Germain Osgood, M.A. CCC-SLP [phone]

## 2016-12-15 DIAGNOSIS — G936 Cerebral edema: Secondary | ICD-10-CM

## 2016-12-15 LAB — GLUCOSE, CAPILLARY
GLUCOSE-CAPILLARY: 120 mg/dL — AB (ref 65–99)
Glucose-Capillary: 138 mg/dL — ABNORMAL HIGH (ref 65–99)
Glucose-Capillary: 99 mg/dL (ref 65–99)

## 2016-12-15 LAB — BASIC METABOLIC PANEL
Anion gap: 10 (ref 5–15)
BUN: 34 mg/dL — AB (ref 6–20)
CALCIUM: 8.4 mg/dL — AB (ref 8.9–10.3)
CO2: 21 mmol/L — ABNORMAL LOW (ref 22–32)
CREATININE: 2.15 mg/dL — AB (ref 0.44–1.00)
Chloride: 123 mmol/L — ABNORMAL HIGH (ref 101–111)
GFR calc Af Amer: 24 mL/min — ABNORMAL LOW (ref >60)
GFR calc non Af Amer: 21 mL/min — ABNORMAL LOW (ref >60)
Glucose, Bld: 142 mg/dL — ABNORMAL HIGH (ref 65–99)
POTASSIUM: 3.7 mmol/L (ref 3.5–5.1)
SODIUM: 154 mmol/L — AB (ref 135–145)

## 2016-12-15 LAB — PHOSPHORUS: PHOSPHORUS: 4.3 mg/dL (ref 2.5–4.6)

## 2016-12-15 LAB — MAGNESIUM: MAGNESIUM: 2.1 mg/dL (ref 1.7–2.4)

## 2016-12-15 MED ORDER — SODIUM CHLORIDE 0.9 % IV SOLN
1.0000 mg/h | INTRAVENOUS | Status: DC
  Administered 2016-12-15: 2 mg/h via INTRAVENOUS
  Filled 2016-12-15: qty 10
  Filled 2016-12-15: qty 5

## 2016-12-15 MED ORDER — MORPHINE BOLUS VIA INFUSION
1.0000 mg | INTRAVENOUS | Status: DC | PRN
  Filled 2016-12-15: qty 4

## 2016-12-15 NOTE — Procedures (Signed)
Extubation Procedure Note  Patient Details:   Name: Julia Madden DOB: 18-Jan-1936 MRN: [id number]   Airway Documentation:     Evaluation  O2 sats: stable throughout Complications: No apparent complications Patient did tolerate procedure well. Bilateral Breath Sounds: Clear, Diminished   No   Pt extubated per Withdrawal of Life Protocol to Room Air. Pt unable to speak or cough post extubation. RT will continue to monitor.    Jesse Sans 12/15/2016, 1:46 PM

## 2016-12-15 NOTE — Progress Notes (Signed)
3% saline stopped at 2301, per standing order. On call neurologist notified. Pharmacy requesting 3% be discontinued. Order obtained. Will continue to monitor. Lianne Bushy RN BSN.

## 2016-12-15 NOTE — Progress Notes (Signed)
Occupational Therapy Discharge Patient Details Name: Julia Madden MRN: [id number] DOB: Mar 31, 1936 Today's Date: 12/15/2016 Time:  -     Patient discharged from OT services secondary to medical decline - will need to re-order OT to resume therapy services.  Please see latest therapy progress note for current level of functioning and progress toward goals.    Progress and discharge plan discussed with patient and/or caregiver: Pt transitioning to comfort care.   GO     [name] M 12/15/2016, 10:14 AM

## 2016-12-15 NOTE — Progress Notes (Signed)
PULMONARY / CRITICAL CARE MEDICINE   Name: Julia Madden MRN: [id number] DOB: Feb 15, 1936    ADMISSION DATE:  12/15/2016 CONSULTATION DATE:  12/13/2016  REFERRING MD :  Dr. Leonel Ramsay  CHIEF COMPLAINT:  Right CVA  HISTORY OF PRESENT ILLNESS:  81 y.o. female with a history of DM-2, CKD,  CABG x4 in 2010, Afib (on plavex only, no anticoag), previous stroke with mild deficits who presents with acute onset left-sided weakness.  She is currently intubated and unable to provide history.  She was last known normal at 7:30PM on 12/11/2016. She was found to have Right MCA occlusion, received IV TPA and IR achieved Right MCA revascularization with mechanical thrombectomy.   SUBJECTIVE:  No acute events overnight. CT scan shows evolving MCA infarct with hemorrhagic conversion and evidence of herniation with increasing intracerebral pressure as well as suspected right hypothalamic and midbrain infarction.  REVIEW OF SYSTEMS:  Unable to assess given intubation & altered mental status.   VITAL SIGNS: Temp:  [97.6 F (36.4 C)-99.1 F (37.3 C)] 99.1 F (37.3 C) (01/23 0800) Pulse Rate:  [62-80] 71 (01/23 0800) Resp:  [14-25] 19 (01/23 0800) BP: (127-159)/(51-65) 155/59 (01/23 0800) SpO2:  [99 %-100 %] 100 % (01/23 0800) FiO2 (%):  [30 %-40 %] 30 % (01/23 0800) Weight:  [141 lb 15.6 oz (64.4 kg)] 141 lb 15.6 oz (64.4 kg) (01/23 0500)  VENTILATOR SETTINGS: Vent Mode: PRVC FiO2 (%):  [30 %-40 %] 30 % Set Rate:  [15 bmp] 15 bmp Vt Set:  [500 mL] 500 mL PEEP:  [5 cmH20] 5 cmH20 Plateau Pressure:  [11 cmH20-18 cmH20] 18 cmH20   INTAKE / OUTPUT:  Intake/Output Summary (Last 24 hours) at 12/15/16 0944 Last data filed at 12/15/16 0800  Gross per 24 hour  Intake           515.25 ml  Output             1700 ml  Net         -1184.75 ml    PHYSICAL EXAMINATION: General:  No acute distress. Family at bedside.  Integument:  Warm & dry. No rash on exposed skin. Bruising on forearms unchanged.   Extremities:  No cyanosis or clubbing.  HEENT: Endotracheal tube in place. Eyes closed. Cardiovascular:  Regular rate. No edema. No appreciable JVD.  Pulmonary:  Clear with auscultation. Symmetric chest rise on ventilator. Neurological: No spontaneous movements. Currently on a low dose Fentanyl drip.   LABS: CBC Latest Ref Rng & Units 12/14/2016 12/14/2016 11/28/2016  WBC 4.0 - 10.5 K/uL 17.8(H) 17.5(H) 8.4  Hemoglobin 12.0 - 15.0 g/dL 7.1(L) 7.0(L) 9.1(L)  Hematocrit 36.0 - 46.0 % 22.3(L) 22.2(L) 29.0(L)  Platelets 150 - 400 K/uL 429(H) 437(H) 433(H)   BMP Latest Ref Rng & Units 12/15/2016 12/14/2016 12/14/2016  Glucose 65 - 99 mg/dL 142(H) - -  BUN 6 - 20 mg/dL 34(H) - -  Creatinine 0.44 - 1.00 mg/dL 2.15(H) - -  Sodium 135 - 145 mmol/L 154(H) 160(H) 147(H)  Potassium 3.5 - 5.1 mmol/L 3.7 - -  Chloride 101 - 111 mmol/L 123(H) - -  CO2 22 - 32 mmol/L 21(L) - -  Calcium 8.9 - 10.3 mg/dL 8.4(L) - -    STUDIES:  CTA head 1/20: Right M1 occlusion. Intracranial atherosclerosis with mild bilateral ICA stenosis. Atherosclerosis about the right greater than left carotid bifurcations in the neck without significant stenosis.  MRI 1/21: Large acute infarct involving nearly the entirety of the right MCA territory.  Extensive cytotoxic edema with 4 mm midline shift. Extensive confluent petechial type hemorrhage with areas of more discrete hematoma formation in in the right insula and temporoparietal regions. Moderate chronic small vessel ischemic disease.  PORT CXR 12/14/16:  Personally reviewed by me. RUE PICC in good position. ETT in acceptable position. No new focal opacity or effusion appreciated.   CT HEAD W/O 12/13/16: IMPRESSION: Status post right MCA thrombectomy. Swelling in the right MCA territory with slight hyperdensity. Some of this hyperdensity could be contrast staining, but some could also represent petechial blood products. The degree of swelling raises concern for the possibility of  some cerebral infarction, though again, some edema can be seen following intervention.  CT HEAD W/O 12/14/16: IMPRESSION: Evolving large RIGHT MCA territory infarct with hemorrhagic conversion. Worsening 13 mm RIGHT to LEFT subfalcine herniation with RIGHT uncal herniation and basal cistern effacement. LEFT ventricular entrapment. 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  ASSESSMENT / PLAN:  81 y.o. female with right MCA occlusion s/p IV TPA followed by embolectomy & revascularization by IR. Patient now has CT head findings consistent with increasing cerebral edema and herniation. Family at bedside updated at length. Given patient's continued clinical decline family desire to withdraw and proceed with full comfort upon arrival of their last family member. As such, I am transitioning the patient over to a morphine drip in place of a fentanyl drip for comfort and relief of dyspnea. Family will notify us upon arrival of last family member who is currently en route. She may be able to transition to a palliative medicine floor later today after withdrawal of care and I did discuss this process with family at bedside.  I have spent a total of 31 minutes of critical care time today caring for the patient, reviewing the patient's electronic medical record, and updating the patient's family at bedside.  Sonia Baller Ashok Cordia, M.D. [facility] Pulmonary & Critical Care Pager:  [phone] After 3pm or if no response, call [phone] 12/15/2016 9:44 AM

## 2016-12-15 NOTE — Progress Notes (Signed)
PCCM Attending Note: Patient underwent terminal vent weaning and extubation. Spoke with patient's nurse and informed she is planned for transition to a hospice floor. PCCM will sign off at this time.  Sonia Baller Ashok Cordia, M.D. [facility] Pulmonary & Critical Care Pager:  [phone] After 3pm or if no response, call [phone] 3:40 PM 12/15/16

## 2016-12-15 NOTE — Progress Notes (Signed)
Nutrition Brief Note  Chart reviewed. Pt now transitioning to comfort care.  No nutrition interventions warranted at this time.  Please re-consult as needed.   Anola Mcgough RD, LDN, CNSC [phone] Pager [phone] After Hours Pager    

## 2016-12-15 NOTE — Care Management Note (Signed)
Case Management Note  Patient Details  Name: BAYLYNN NARDUCCI MRN: [id number] Date of Birth: August 13, 1936  Subjective/Objective:  Pt admitted on 12/08/2016 with Rt MCA infarct with hemorrhagic conversion and evidence of herniation.  PTA, pt independently living at home with assistance of [facility] aide.                     Action/Plan: Family has chosen to extubate and proceed with full comfort measures.  Will continue to follow/offer support as needed.    Expected Discharge Date:                  Expected Discharge Plan:     In-House Referral:  Clinical Social Work, Education officer, community  CM Consult  Post Acute Care Choice:    Choice offered to:     DME Arranged:    DME Agency:     HH Arranged:    [place] Agency:     Status of Service:  In process, will continue to follow  If discussed at Long Length of Stay Meetings, dates discussed:    Additional Comments:  Reinaldo Raddle, RN, BSN  Trauma/Neuro ICU Case Manager [phone]

## 2016-12-15 NOTE — Progress Notes (Signed)
OT Cancellation Note  Patient Details Name: ANNELLA SICARD MRN: [id number] DOB: 04-Feb-1936   Cancelled Treatment:    Reason Eval/Treat Not Completed: Patient not medically ready.  Pt continues to be on bedrest and developed decreased responsiveness overnight.  Will check back tomorrow to determine appropriateness for therapies.  [place], OTR/L [id number]   Lucille Passy M 12/15/2016, 9:41 AM

## 2016-12-15 NOTE — Progress Notes (Addendum)
PT Cancellation Note  Patient Details Name: Julia Madden MRN: [id number] DOB: Nov 16, 1936   Cancelled Treatment:    Reason Eval/Treat Not Completed: Patient not medically ready Pt continues to be on bedrest and not appropriate for PT at this time due to with worsening condition over night. Will follow up as appropriate.  After discussion with RN, pt going towards comfort care. Will sign off for now. Please re consult if anything changes. thanks   [place] 12/15/2016, 8:57 AM [name], PT, DPT [phone]

## 2016-12-15 NOTE — Progress Notes (Signed)
STROKE TEAM PROGRESS NOTE   HISTORY OF PRESENT ILLNESS (per record) Julia Madden is a 81 y.o. female with a history of previous stroke with mild deficits who presents with acute onset left-sided weakness. Her previous stroke happened in the setting of cardiac surgery and she improved very quickly, but was debilitated because of the cardiac surgery and therefore did have to rehabilitate following this. Currently she is able to take care of all of her own activities of daily living, going up and down stairs, but does occasionally have an aide that comes and helps with chores.  LKW: 7:30 PM tpa given?: yes Modified Rankin score: 2   SUBJECTIVE (INTERVAL HISTORY) multiple family members present. The patient remains intubated.Neurologically worsened last night.  Repeat Ct shows extensive cerebral edema with 12 mm right to left shift and uncal and transfalcine herniation. Neuro exam declined with only trace withdrawal on right now.srum sodium was 160 and 3% drip on hold  OBJECTIVE Temp:  [97.7 F (36.5 C)-99.1 F (37.3 C)] 97.7 F (36.5 C) (01/23 1200) Pulse Rate:  [63-80] 67 (01/23 1200) Cardiac Rhythm: Normal sinus rhythm (01/23 0800) Resp:  [14-25] 15 (01/23 1200) BP: (133-159)/(51-81) 141/81 (01/23 1200) SpO2:  [97 %-100 %] 97 % (01/23 1338) FiO2 (%):  [30 %] 30 % (01/23 1200) Weight:  [141 lb 15.6 oz (64.4 kg)] 141 lb 15.6 oz (64.4 kg) (01/23 0500)  CBC:   Recent Labs Lab 11/29/2016 2100 12/14/16 0523 12/14/16 1812  WBC 8.4 17.5* 17.8*  NEUTROABS 6.4  --   --   HGB 9.1* 7.0* 7.1*  HCT 29.0* 22.2* 22.3*  MCV 85.8 84.4 84.8  PLT 433* 437* 429*    Basic Metabolic Panel:  Recent Labs Lab 12/14/16 0523  12/14/16 1811 12/14/16 2246 12/15/16 0507  NA 145  < > 147* 160* 154*  K 4.3  --   --   --  3.7  CL 115*  --   --   --  123*  CO2 21*  --   --   --  21*  GLUCOSE 123*  --   --   --  142*  BUN 31*  --   --   --  34*  CREATININE 2.09*  --   --   --  2.15*  CALCIUM  8.3*  --   --   --  8.4*  MG 1.9  < > 2.0  --  2.1  PHOS 5.2*  < > 4.8*  --  4.3  < > = values in this interval not displayed.  Lipid Panel:     Component Value Date/Time   CHOL 105 12/13/2016 0211   TRIG 138 12/13/2016 0211   HDL 32 (L) 12/13/2016 0211   CHOLHDL 3.3 12/13/2016 0211   VLDL 28 12/13/2016 0211   LDLCALC 45 12/13/2016 0211   HgbA1c:  Lab Results  Component Value Date   HGBA1C 11.7 (H) 12/13/2016   Urine Drug Screen: No results found for: LABOPIA, COCAINSCRNUR, LABBENZ, AMPHETMU, THCU, LABBARB    IMAGING   CT Head Wo Contrast 12/13/2016 Status post right MCA thrombectomy. Swelling in the right MCA territory with slight hyperdensity.  Some of this hyperdensity could be contrast staining, but some could also represent petechial blood products.  The degree of swelling raises concern for the possibility of some cerebral infarction, though again, some edema can be seen following intervention.    Ct Angio Head and Neck  W Or Wo Contrast 12/15/2016 1. Right M1 occlusion.  2. Intracranial atherosclerosis with mild bilateral ICA stenosis.  3. Atherosclerosis about the right greater than left carotid bifurcations in the neck without significant stenosis.    Ir US Guide Vasc Access Right 12/13/2016 Status post right cerebral angiogram and mechanical thrombectomy of acute right MCA occlusion, with complete restoration of flow, TICI-3.  Signed, Dulcy Fanny. Earleen Newport, DO [facility]    Portable Chest Xray 12/13/2016 Pulmonary venous hypertension. Bibasilar atelectasis. No focal consolidation. Endotracheal tube in satisfactory position.    Ct Head Code Stroke W/o Cm Addendum Date: 12/04/2016   ADDENDUM REPORT: Chronic appearing left cerebellar and right occipital lobe infarcts.  11/23/2016 1. No acute intracranial hemorrhage or definite acute infarct identified.  2. ASPECTS is 10.  3. Dense right MCA concerning for  acute thrombus.  4. Moderate chronic small vessel ischemic disease and cerebral atrophy.   MRI brain   large RMCA hemorrhagic infarct with cytotoxic edema and 4 mm right to left shift,    PHYSICAL EXAM  Elderly caucasian lady who is intubated.   Afebrile. Head is nontraumatic. Neck is supple without bruit.    Cardiac exam no murmur or gallop. Lungs are clear to auscultation. Distal pulses are well felt.  Neurological Exam :    Intubated.comatose. Eyes closed. Not following any commands.. Right gaze preference  Fundi not visualized. Dense left hemiplegi 0/5 with hypotonia. withdraws right side minimally to pain. Plantar down going on right and upgoing on the left. ASSESSMENT/PLAN Julia Madden is a 81 y.o. female with history of  previous strokes, hypertension, hyperlipidemia, diabetes mellitus, chronic kidney disease, coronary artery disease, and atrial myxoma excised with PFO closure, presenting with acute onset left-sided weakness. She received IV t-PA Saturday, 12/07/2016 at 2045 with subsequent mechanical thrombectomy of a right M1 occlusion with successful recanalization.  Stroke:  Non-dominant infarct embolic withcytotoxic edema and right to left brain herniation. Etiology ? H/o myxoma s/p resection   Resultant  Left hemiplegia and hemianopia  MRI - pending  MRA - not performed  CTA Head & Neck - Right M1 occlusion.   Carotid Doppler - CTA neck 2D Echo - Left ventricle: The cavity size was normal. There was mild   concentric hypertrophy. Systolic function was normal. The   estimated ejection fraction was in the range of 55% to 60%. Wall   motion was normal; there were no regional wall motion    abnormalities.   LDL - 45  HgbA1c - 111.7  VTE prophylaxis - SCDs    aspirin 81 mg daily and clopidogrel 75 mg daily prior to admission, now on No antithrombotic s/p TPA and thrombectomy.  Ongoing aggressive stroke risk factor management  Therapy recommendations:   pending  Disposition: Pending  Hypertension  Blood pressure mildly low - off Cardene  Permissive hypertension (OK if < 220/120) but gradually normalize in 5-7 days  Long-term BP goal normotensive  Hyperlipidemia  Home meds:  Lipitor 20 mg daily not resumed in hospital  LDL 45, goal < 70  Resume Lipitor when PO access is available.  Continue statin at discharge  Diabetes  HgbA1c 11.5 goal < 7.0  Uncontrolled  Other Stroke Risk Factors  Advanced age  Hx stroke/TIA  Family hx stroke (mother)  Coronary artery disease   Other Active Problems  Hyponatremia - 133  Anemia - 9.1 / 29  Renal insufficiency - 28 / 1.83  PLAN  Hold aspirin due to hemorrhagic transformation  Patient`s prognosis quite grim given hemorrhagic  transformation and extensive cerebral edema and midline shift despite hypertonic saline  Hospital day # 3   I have personally examined this patient, reviewed notes, independently viewed imaging studies, participated in medical decision making and plan of care.ROS completed by me personally and pertinent positives fully documented  I have made any additions or clarifications directly to the above note. . She presented with large right MCA infarct due to right M1 occlusion and underwent thrombolysis with IV TPA as well as mechanical embolectomy with good recannulization but follow-up imaging that suggested large infarct.with cytotoxic edema and brain right to left shift. I had a long discussion with patient's son and multiple family members about her worsening prognosis and discuss results of repeat CT scan and declining neurological exam. Family is quite clear that patient would not have wanted prolonged ventilatory support, tracheostomy, PEG tube and nursing home placement which appears unavoidable. Family is leaning towards comfort care would like to wait for arrival of patient's daughter from [place] later today. They were given opportunity to ask  questions which were answered This patient is critically ill and at significant risk of neurological worsening, death and care requires constant monitoring of vital signs, hemodynamics,respiratory and cardiac monitoring, extensive review of multiple databases, frequent neurological assessment, discussion with family, other specialists and medical decision making of high complexity.I have made any additions or clarifications directly to the above note.This critical care time does not reflect procedure time, or teaching time or supervisory time of PA/NP/Med Resident etc but could involve care discussion time.  I spent 40 minutes of neurocritical care time  in the care of  this patient.      Antony Contras, MD Medical Director [facility] Pager: [phone] 12/15/2016  11:29 am      To contact Stroke Continuity provider, please refer to [email or web address] After hours, contact General Neurology

## 2016-12-16 ENCOUNTER — Ambulatory Visit: Payer: Medicare Other | Admitting: Neurology

## 2016-12-18 ENCOUNTER — Telehealth: Payer: Self-pay | Admitting: Interventional Cardiology

## 2016-12-24 NOTE — Progress Notes (Signed)
16mL morphine IV infusion wasted in sink with Caprice Beaver, RN

## 2016-12-24 NOTE — Progress Notes (Addendum)
Patient asystolic on monitor at [id number], no heart sounds auscultated and verified by myself and Margo Aye, RN.  MD paged.

## 2016-12-24 NOTE — Progress Notes (Signed)
Provided spiritual/emotional support and prayer to family of pt who'd passed as they were completing their final visitation in rm. Family is Catholic, and appreciated that priest had come yesterday to administer last rites. They already have pt placement info for making funeral arrangements.   12/25/16 0900  Clinical Encounter Type  Visited With Family  Visit Type Initial;Death  Referral From Nurse  Spiritual Encounters  Spiritual Needs Prayer;Grief support   Gerrit Heck, Chaplain

## 2016-12-24 NOTE — Death Summary Note (Signed)
DEATH SUMMARY   Patient ID: RAEL FELKINS MRN: [id number] DOB/AGE: 04-20-1936 82 y.o.  Admit date: 12/29/2016 Death date: Jan 02, 2017 February 14, 2023 32 am  Admission Diagnoses: Left sided weakness  Cause of Death:  Brain herniation secondary to cytotoxic edema due to large right middle cerebral artery infarct secondary to middle cerebral artery occlusion treated with IV TPA followed by mechanical thrombectomy with successful recanalization. Patient made DO NOT RESUSCITATE by family and comfort care and ventilatory support withdrawn  Pertinent Medical Diagnosis: Active Problems:   Stroke (cerebrum) (HCC)   Pressure injury of skin   Cytotoxic brain edema (HCC)   Acute respiratory failure [facility])   Hospital Course:  ELAN TETZLOFF a 81 y.o.femalewith a history of previous stroke with mild deficits who presents with acute onset left-sided weakness. Her previous stroke happened in the setting of cardiac surgery and she improved very quickly, but was debilitated because of the cardiac surgery and therefore did have to rehabilitate following this. At baseline she was able to take care of all of her own activities of daily living, going up and down stairs, but does occasionally have an aide that comes and helps with chores. LKW: 7:30 PM Dec 29, 2016 tpa given?: yes Modified Rankin score: 2  patient received IV tPA without improvement she was taken to interventional radiology lab mechanical thrombectomy was performed with successful recanalization however the patient did not show neurological improvement subsequent brain imaging showed massive brain edema but development of cytotoxic edema with  Slight hemorrhagic transformation significant right-to-left trans-falcine herniation. Patient was treated with hypertonic saline as well as was kept in ICU with intubation. Patient prognosis is poor. This was discussed with the patient's family on multiple occasions they understood the poor prognosis and felt patient  would not have wanted prolonged ventilatory support as well as tracheostomy, PEG tube and nursing home transfer. To make the patient DO NOT RESUSCITATE and comfort care. Patient was kept comfortable on morphine drip and was terminally extubated. She passed away peacefully shortly thereafter. Family were notified.  Signed: Quindarrius [place] 12/18/2016, 3:04 PM

## 2016-12-24 DEATH — deceased

## 2017-01-22 ENCOUNTER — Ambulatory Visit: Payer: Medicare Other | Admitting: Internal Medicine

## 2017-04-23 NOTE — Addendum Note (Signed)
Addendum  created 04/23/17 0920 by Effie Berkshire, MD   Sign clinical note

## 2017-06-02 NOTE — Telephone Encounter (Signed)
Pt deceased

## 2018-01-02 IMAGING — MR MR HEAD W/O CM
9 of 10 series · 34 of 48 positions shown · non-contrast
Comparison: Head CT 12/13/2016

CLINICAL DATA: Left-sided weakness. Right M1 occlusion status post
thrombectomy.

EXAM:
MRI HEAD WITHOUT CONTRAST
TECHNIQUE: Multiplanar, multiecho pulse sequences of the brain and surrounding
structures were obtained without intravenous contrast.

[Series 2: FLAIR · sagittal · 5.0mm · 0.47mm/px · 1 of 25 slices shown (1 of 2)]
[im 1/25]
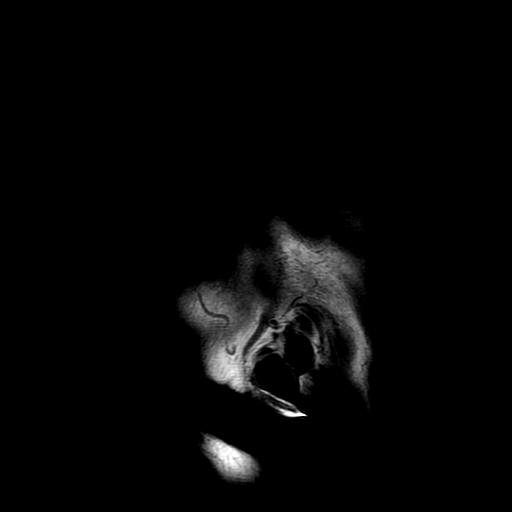

[Series 4: DWI · axial · 3.0mm · 0.94mm/px · z∈[-33,+111]mm · 8 of 106 slices shown (1 of 2)]
[im 1/106]
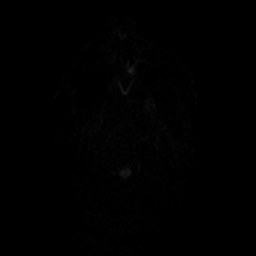
[im 12/106]
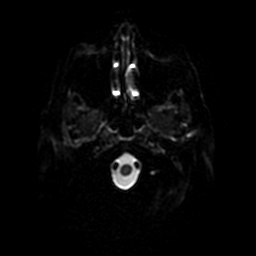
[im 36/106]
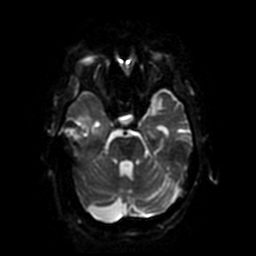
[im 47/106]
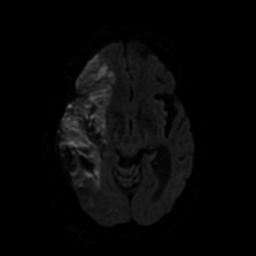
[im 59/106]
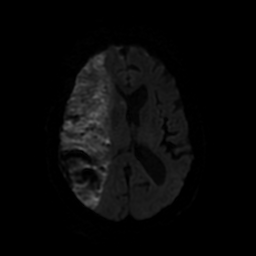
[im 71/106]
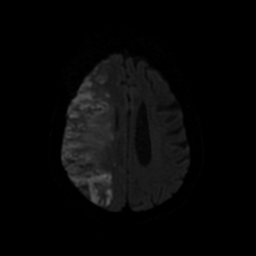
[im 94/106]
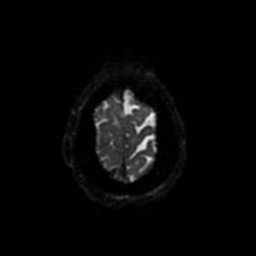
[im 106/106]
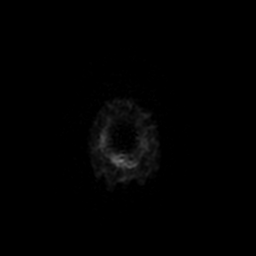

[Series 5: DWI · coronal · 4.0mm · 0.94mm/px · 6 of 68 slices shown (2 of 2)]
[im 1/68]
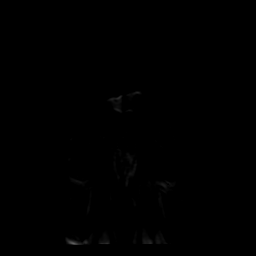
[im 14/68]
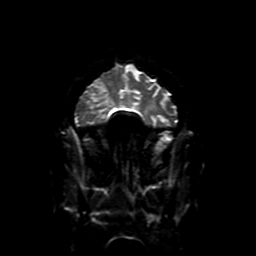
[im 27/68]
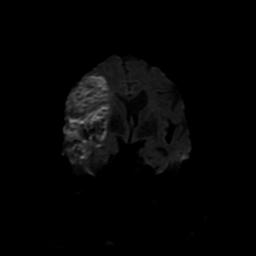
[im 41/68]
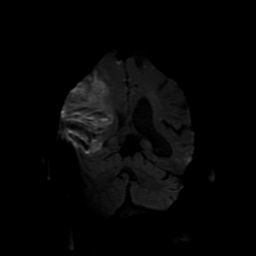
[im 54/68]
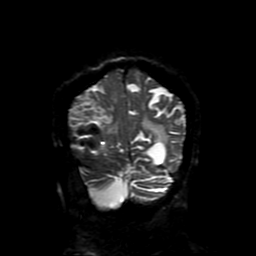
[im 68/68]
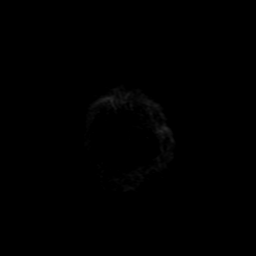

[Series 7: T2 · axial · 5.0mm · 0.47mm/px · z∈[-33,+111]mm · 2 of 27 slices shown (1 of 2)]
[im 1/27]
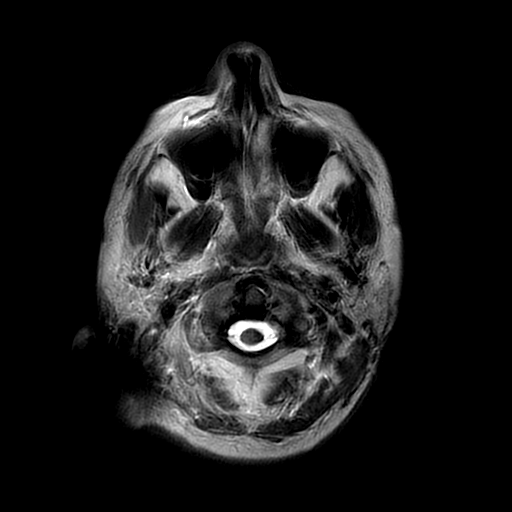
[im 27/27]
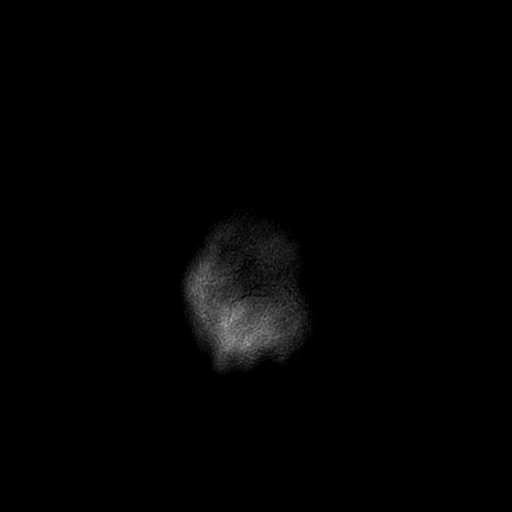

[Series 8: FLAIR · axial · 4.0mm · 0.41mm/px · z∈[-38,+104]mm · 3 of 29 slices shown (2 of 2)]
[im 1/29]
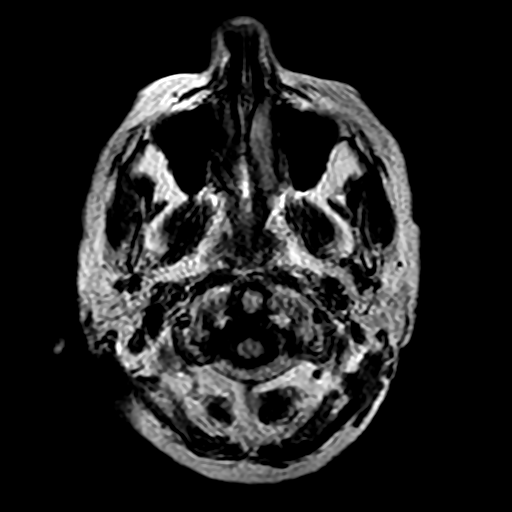
[im 15/29]
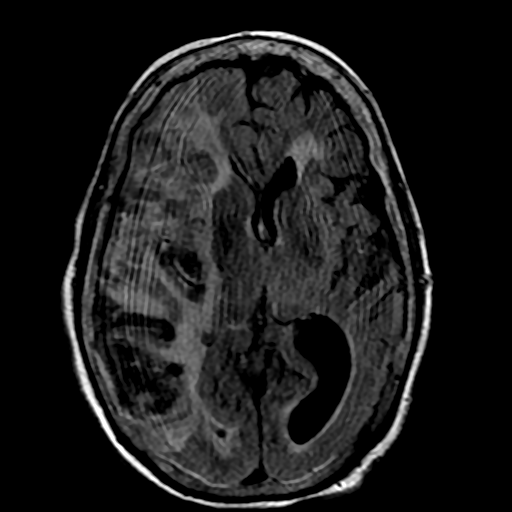
[im 29/29]
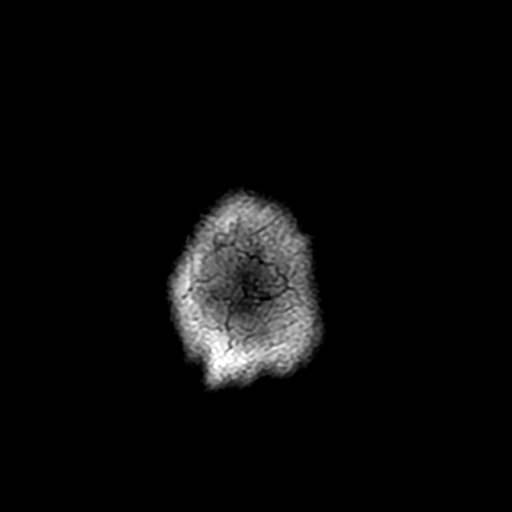

[Series 9: (person_name) · axial · 3.0mm · 0.47mm/px · z∈[-34,+14]mm · 3 of 108 slices shown]
[im 1/108]
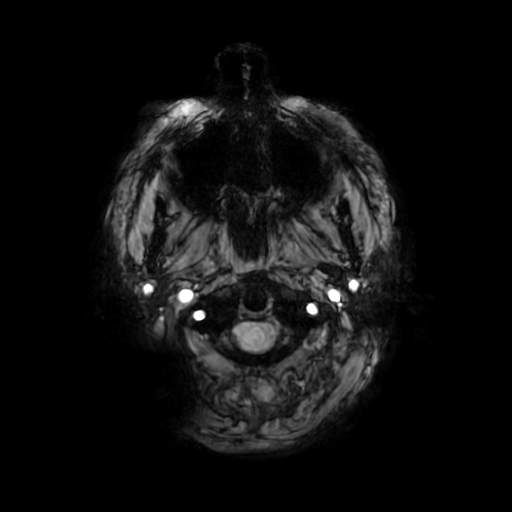
[im 12/108]
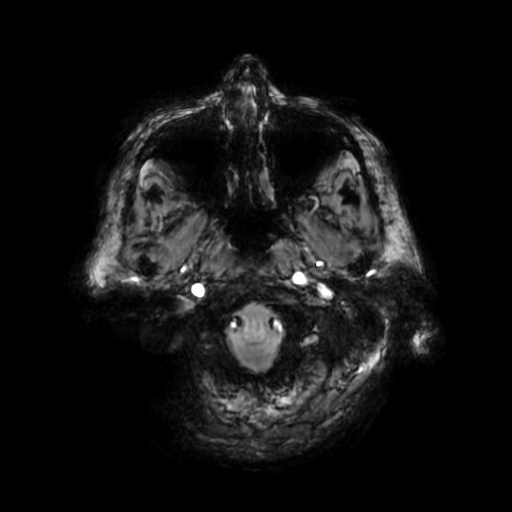
[im 36/108]
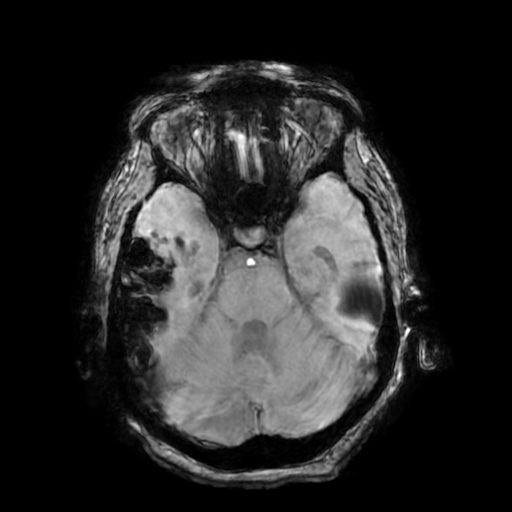

[Series 11: T2 · coronal · 5.0mm · 0.39mm/px · 3 of 28 slices shown (2 of 2)]
[im 1/28]
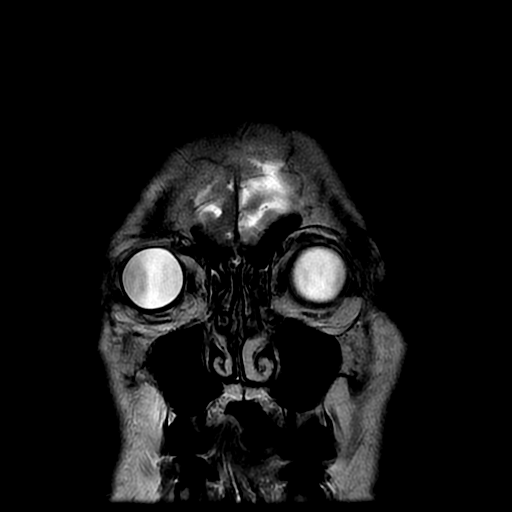
[im 14/28]
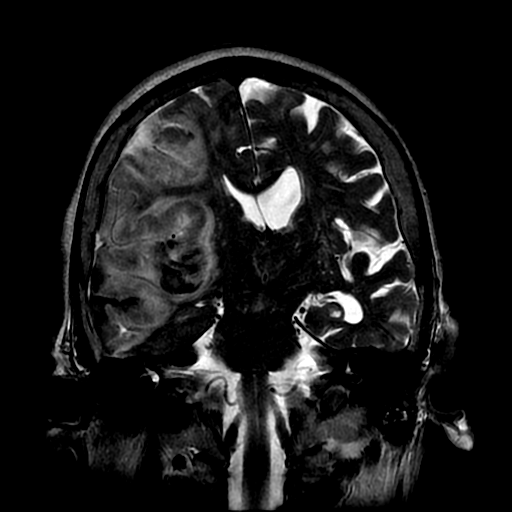
[im 28/28]
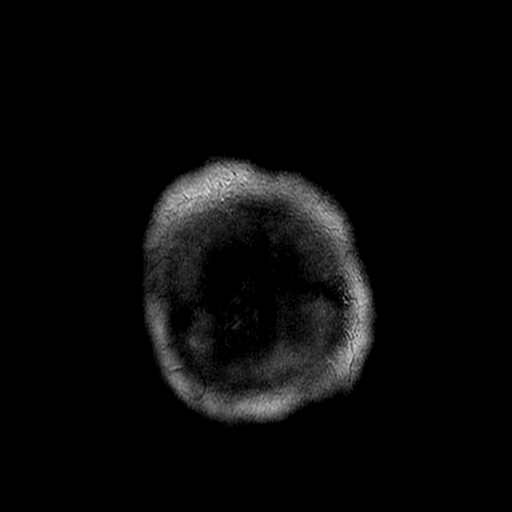

[Series 450: ADC · axial · 3.0mm · 0.94mm/px · z∈[-33,+111]mm · 5 of 53 slices shown (1 of 2)]
[im 1/53]
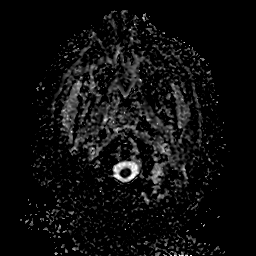
[im 14/53]
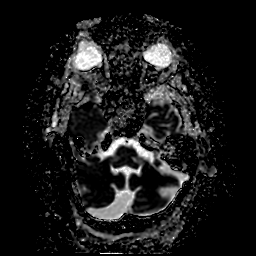
[im 27/53]
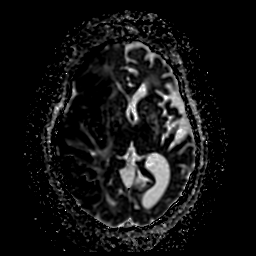
[im 40/53]
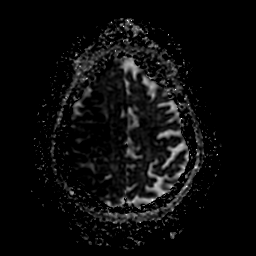
[im 53/53]
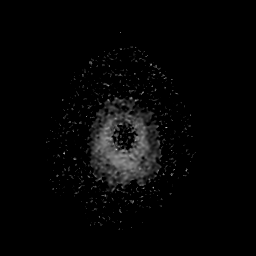

[Series 550: ADC · coronal · 4.0mm · 0.94mm/px · 3 of 34 slices shown (2 of 2)]
[im 1/34]
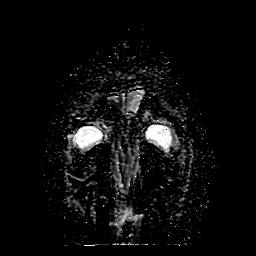
[im 17/34]
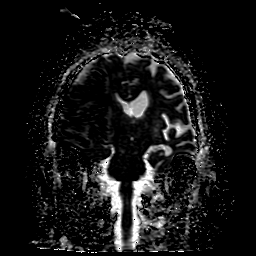
[im 34/34]
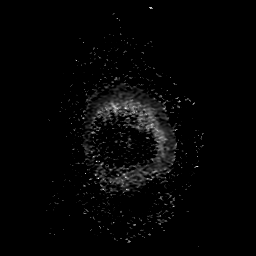

[34 of 48 positions shown; findings below may reference images not displayed]

FINDINGS: Brain: There is a large acute infarct involving nearly the entirety
of the right MCA territory. The basal ganglia are largely spared
aside from a small infarct in the right caudate. There is extensive
confluent petechial type hemorrhage throughout the infarct with some
areas of more discrete hematoma formation in the right
temporoparietal and insular regions measuring up to approximately
3.5 cm in size. There is extensive associated cytotoxic edema. The
right lateral ventricle is largely effaced, and there is 4 mm of
leftward midline shift. The left lateral ventricle is unchanged in
size from the CT of the time of presentation. There are small
chronic left cerebellar and right occipital infarcts. Patchy T2
hyperintensities in the left cerebral hemispheric white matter are
compatible with moderate chronic small vessel ischemic disease.
There is moderate cerebral atrophy. A mega cisterna magna is
incidentally noted.

Vascular: Major intracranial vascular flow voids are preserved.

Skull and upper cervical spine: Unremarkable bone marrow signal.

Sinuses/Orbits: Prior bilateral cataract extraction. No significant
sinus scratched a trace left mastoid effusion. No significant
paranasal sinus disease.

Other: None.
IMPRESSION: 1. Large acute infarct involving nearly the entirety of the right
MCA territory. Extensive cytotoxic edema with 4 mm midline shift.
Extensive confluent petechial type hemorrhage with areas of more
discrete hematoma formation in in the right insula and
temporoparietal regions.
2. Moderate chronic small vessel ischemic disease.
These results will be called to the ordering clinician or
representative by the Radiologist Assistant, and communication
documented in the PACS or zVision Dashboard.

## 2018-01-03 IMAGING — CR DG CHEST 1V PORT
1 series · 1 of 1 positions shown · non-contrast
Comparison: No recent prior .

CLINICAL DATA: Respiratory failure.

EXAM:
PORTABLE CHEST 1 VIEW

[portable]
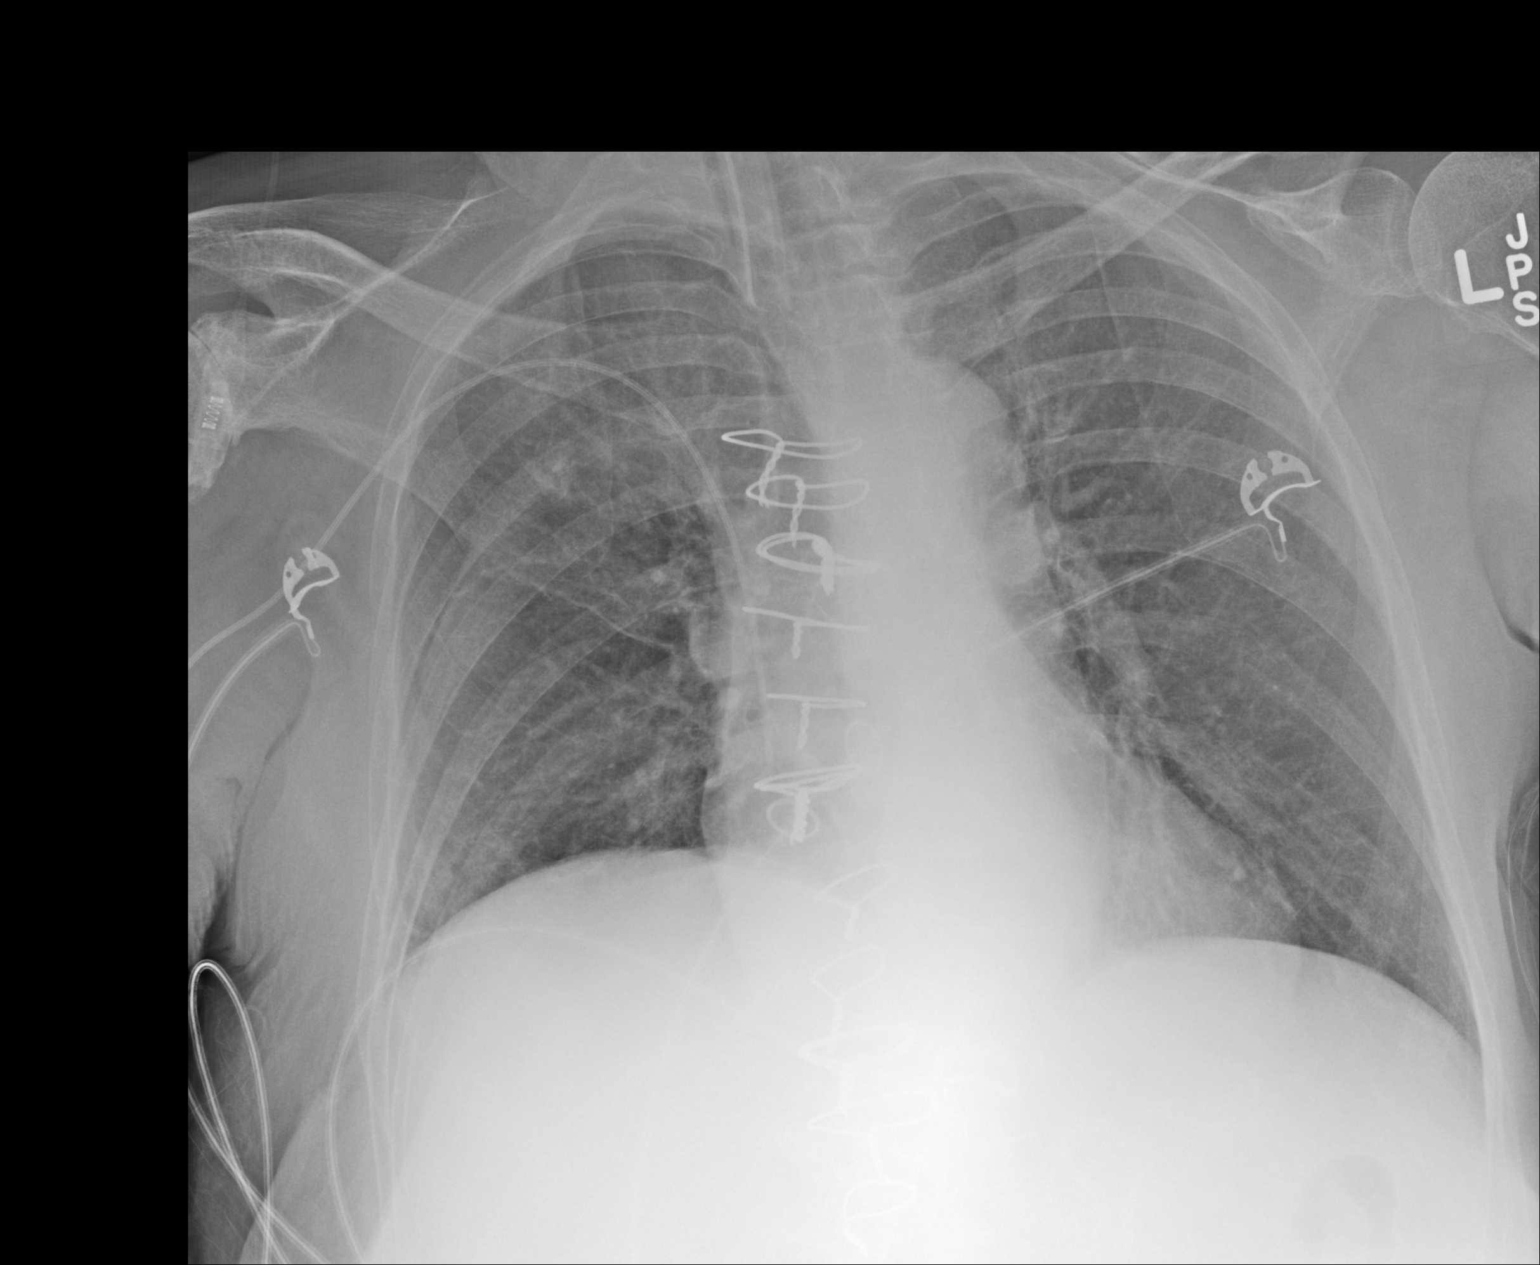

[1 of 1 positions shown; findings below may reference images not displayed]

FINDINGS: Endotracheal tube, right PICC line stable position. Prior CABG.
Heart size stable. Mild developing infiltrate right upper lobe
cannot be excluded. Low lung volumes with mild basilar atelectasis.
No pleural effusion or pneumothorax.
IMPRESSION: 1. Lines and tubes in stable position.

2. Prior CABG.  Heart size stable.

3. Mild developing infiltrate right upper lobe cannot be excluded.
Low lung volumes with mild basilar atelectasis.
